# Patient Record
Sex: Female | Born: 1935 | Race: White | Hispanic: No | Smoking: Never smoker
Health system: Southern US, Community
[De-identification: ages and names within clinical notes are randomized; demographics above are authoritative.]

## PROBLEM LIST (undated history)

## (undated) DIAGNOSIS — I2510 Atherosclerotic heart disease of native coronary artery without angina pectoris: Secondary | ICD-10-CM

## (undated) DIAGNOSIS — F0390 Unspecified dementia without behavioral disturbance: Secondary | ICD-10-CM

## (undated) DIAGNOSIS — M109 Gout, unspecified: Secondary | ICD-10-CM

## (undated) DIAGNOSIS — C7A8 Other malignant neuroendocrine tumors: Principal | ICD-10-CM

## (undated) DIAGNOSIS — Z8719 Personal history of other diseases of the digestive system: Secondary | ICD-10-CM

## (undated) DIAGNOSIS — I639 Cerebral infarction, unspecified: Secondary | ICD-10-CM

## (undated) DIAGNOSIS — I214 Non-ST elevation (NSTEMI) myocardial infarction: Secondary | ICD-10-CM

## (undated) DIAGNOSIS — C9110 Chronic lymphocytic leukemia of B-cell type not having achieved remission: Secondary | ICD-10-CM

## (undated) DIAGNOSIS — R0609 Other forms of dyspnea: Secondary | ICD-10-CM

## (undated) DIAGNOSIS — I219 Acute myocardial infarction, unspecified: Secondary | ICD-10-CM

## (undated) DIAGNOSIS — K219 Gastro-esophageal reflux disease without esophagitis: Secondary | ICD-10-CM

## (undated) DIAGNOSIS — K922 Gastrointestinal hemorrhage, unspecified: Secondary | ICD-10-CM

## (undated) DIAGNOSIS — E785 Hyperlipidemia, unspecified: Secondary | ICD-10-CM

## (undated) DIAGNOSIS — I2789 Other specified pulmonary heart diseases: Secondary | ICD-10-CM

## (undated) DIAGNOSIS — I10 Essential (primary) hypertension: Secondary | ICD-10-CM

## (undated) DIAGNOSIS — D649 Anemia, unspecified: Secondary | ICD-10-CM

## (undated) DIAGNOSIS — K869 Disease of pancreas, unspecified: Secondary | ICD-10-CM

## (undated) DIAGNOSIS — F32A Depression, unspecified: Secondary | ICD-10-CM

## (undated) DIAGNOSIS — I4891 Unspecified atrial fibrillation: Secondary | ICD-10-CM

## (undated) DIAGNOSIS — F329 Major depressive disorder, single episode, unspecified: Secondary | ICD-10-CM

## (undated) DIAGNOSIS — E109 Type 1 diabetes mellitus without complications: Secondary | ICD-10-CM

## (undated) DIAGNOSIS — D72829 Elevated white blood cell count, unspecified: Secondary | ICD-10-CM

## (undated) DIAGNOSIS — I4821 Permanent atrial fibrillation: Secondary | ICD-10-CM

## (undated) DIAGNOSIS — G609 Hereditary and idiopathic neuropathy, unspecified: Secondary | ICD-10-CM

## (undated) DIAGNOSIS — I209 Angina pectoris, unspecified: Secondary | ICD-10-CM

## (undated) HISTORY — DX: Personal history of other diseases of the digestive system: Z87.19

## (undated) HISTORY — DX: Disease of pancreas, unspecified: K86.9

## (undated) HISTORY — DX: Atherosclerotic heart disease of native coronary artery without angina pectoris: I25.10

## (undated) HISTORY — DX: Other specified pulmonary heart diseases: I27.89

## (undated) HISTORY — DX: Essential (primary) hypertension: I10

## (undated) HISTORY — PX: CATARACT EXTRACTION W/ INTRAOCULAR LENS  IMPLANT, BILATERAL: SHX1307

## (undated) HISTORY — DX: Anemia, unspecified: D64.9

## (undated) HISTORY — DX: Chronic lymphocytic leukemia of B-cell type not having achieved remission: C91.10

## (undated) HISTORY — DX: Gout, unspecified: M10.9

## (undated) HISTORY — DX: Gastrointestinal hemorrhage, unspecified: K92.2

## (undated) HISTORY — DX: Hyperlipidemia, unspecified: E78.5

## (undated) HISTORY — DX: Hereditary and idiopathic neuropathy, unspecified: G60.9

## (undated) HISTORY — PX: APPENDECTOMY: SHX54

## (undated) HISTORY — DX: Type 1 diabetes mellitus without complications: E10.9

## (undated) HISTORY — DX: Elevated white blood cell count, unspecified: D72.829

## (undated) HISTORY — DX: Gastro-esophageal reflux disease without esophagitis: K21.9

## (undated) HISTORY — DX: Other malignant neuroendocrine tumors: C7A.8

## (undated) HISTORY — PX: CARDIAC SURGERY: SHX584

## (undated) HISTORY — PX: BELPHAROPTOSIS REPAIR: SHX369

---
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
## 1968-12-12 HISTORY — PX: OOPHORECTOMY: SHX86

## 1968-12-12 HISTORY — PX: ABDOMINAL HYSTERECTOMY: SHX81

## 1997-04-13 DIAGNOSIS — I219 Acute myocardial infarction, unspecified: Secondary | ICD-10-CM

## 1997-04-13 HISTORY — DX: Acute myocardial infarction, unspecified: I21.9

## 1997-04-13 HISTORY — PX: CORONARY ARTERY BYPASS GRAFT: SHX141

## 1998-07-02 ENCOUNTER — Ambulatory Visit (HOSPITAL_COMMUNITY): Admission: RE | Admit: 1998-07-02 | Discharge: 1998-07-03 | Payer: Self-pay | Admitting: Cardiology

## 1998-07-02 ENCOUNTER — Encounter: Payer: Self-pay | Admitting: Cardiology

## 1998-07-03 ENCOUNTER — Encounter: Payer: Self-pay | Admitting: Cardiology

## 1999-02-21 ENCOUNTER — Encounter: Payer: Self-pay | Admitting: Endocrinology

## 1999-02-21 ENCOUNTER — Encounter: Admission: RE | Admit: 1999-02-21 | Discharge: 1999-02-21 | Payer: Self-pay | Admitting: Endocrinology

## 1999-07-29 ENCOUNTER — Encounter: Payer: Self-pay | Admitting: Cardiology

## 1999-07-30 ENCOUNTER — Inpatient Hospital Stay (HOSPITAL_COMMUNITY): Admission: RE | Admit: 1999-07-30 | Discharge: 1999-07-31 | Payer: Self-pay | Admitting: Cardiology

## 1999-07-30 ENCOUNTER — Encounter: Payer: Self-pay | Admitting: Cardiology

## 2000-01-14 ENCOUNTER — Encounter: Payer: Self-pay | Admitting: Endocrinology

## 2000-01-14 ENCOUNTER — Encounter: Admission: RE | Admit: 2000-01-14 | Discharge: 2000-01-14 | Payer: Self-pay | Admitting: Endocrinology

## 2001-02-07 ENCOUNTER — Ambulatory Visit (HOSPITAL_COMMUNITY): Admission: RE | Admit: 2001-02-07 | Discharge: 2001-02-07 | Payer: Self-pay | Admitting: Family Medicine

## 2001-02-07 ENCOUNTER — Encounter: Payer: Self-pay | Admitting: Family Medicine

## 2001-10-25 ENCOUNTER — Ambulatory Visit (HOSPITAL_COMMUNITY): Admission: RE | Admit: 2001-10-25 | Discharge: 2001-10-25 | Payer: Self-pay | Admitting: Ophthalmology

## 2001-10-29 ENCOUNTER — Emergency Department (HOSPITAL_COMMUNITY): Admission: EM | Admit: 2001-10-29 | Discharge: 2001-10-29 | Payer: Self-pay | Admitting: Internal Medicine

## 2001-11-10 ENCOUNTER — Ambulatory Visit (HOSPITAL_COMMUNITY): Admission: RE | Admit: 2001-11-10 | Discharge: 2001-11-10 | Payer: Self-pay | Admitting: Ophthalmology

## 2001-12-15 ENCOUNTER — Ambulatory Visit (HOSPITAL_COMMUNITY): Admission: RE | Admit: 2001-12-15 | Discharge: 2001-12-15 | Payer: Self-pay | Admitting: Family Medicine

## 2001-12-15 ENCOUNTER — Encounter: Payer: Self-pay | Admitting: Family Medicine

## 2002-09-25 ENCOUNTER — Encounter: Payer: Self-pay | Admitting: Family Medicine

## 2002-09-25 ENCOUNTER — Ambulatory Visit (HOSPITAL_COMMUNITY): Admission: RE | Admit: 2002-09-25 | Discharge: 2002-09-25 | Payer: Self-pay | Admitting: Family Medicine

## 2002-10-31 ENCOUNTER — Encounter: Payer: Self-pay | Admitting: Otolaryngology

## 2002-10-31 ENCOUNTER — Encounter: Admission: RE | Admit: 2002-10-31 | Discharge: 2002-10-31 | Payer: Self-pay | Admitting: Otolaryngology

## 2003-04-03 ENCOUNTER — Ambulatory Visit (HOSPITAL_COMMUNITY): Admission: RE | Admit: 2003-04-03 | Discharge: 2003-04-03 | Payer: Self-pay | Admitting: Family Medicine

## 2003-04-18 ENCOUNTER — Encounter: Admission: RE | Admit: 2003-04-18 | Discharge: 2003-04-18 | Payer: Self-pay | Admitting: Neurology

## 2003-05-29 ENCOUNTER — Ambulatory Visit (HOSPITAL_COMMUNITY): Admission: RE | Admit: 2003-05-29 | Discharge: 2003-05-29 | Payer: Self-pay | Admitting: Family Medicine

## 2003-06-08 ENCOUNTER — Encounter: Admission: RE | Admit: 2003-06-08 | Discharge: 2003-06-08 | Payer: Self-pay | Admitting: Neurology

## 2003-06-18 ENCOUNTER — Ambulatory Visit (HOSPITAL_COMMUNITY): Admission: RE | Admit: 2003-06-18 | Discharge: 2003-06-18 | Payer: Self-pay | Admitting: Family Medicine

## 2003-07-04 ENCOUNTER — Encounter (HOSPITAL_COMMUNITY): Admission: RE | Admit: 2003-07-04 | Discharge: 2003-08-03 | Payer: Self-pay | Admitting: Neurology

## 2003-08-23 ENCOUNTER — Ambulatory Visit (HOSPITAL_COMMUNITY): Admission: RE | Admit: 2003-08-23 | Discharge: 2003-08-23 | Payer: Self-pay | Admitting: *Deleted

## 2003-10-08 ENCOUNTER — Ambulatory Visit (HOSPITAL_COMMUNITY): Admission: RE | Admit: 2003-10-08 | Discharge: 2003-10-08 | Payer: Self-pay | Admitting: *Deleted

## 2004-12-11 ENCOUNTER — Ambulatory Visit: Payer: Self-pay | Admitting: *Deleted

## 2005-01-20 ENCOUNTER — Ambulatory Visit: Payer: Self-pay | Admitting: Cardiology

## 2005-01-28 ENCOUNTER — Ambulatory Visit: Payer: Self-pay | Admitting: *Deleted

## 2005-01-29 ENCOUNTER — Ambulatory Visit: Payer: Self-pay | Admitting: Cardiology

## 2005-02-12 ENCOUNTER — Ambulatory Visit: Payer: Self-pay | Admitting: *Deleted

## 2005-03-26 ENCOUNTER — Ambulatory Visit (HOSPITAL_COMMUNITY): Admission: RE | Admit: 2005-03-26 | Discharge: 2005-03-26 | Payer: Self-pay | Admitting: Family Medicine

## 2005-03-30 ENCOUNTER — Encounter: Admission: RE | Admit: 2005-03-30 | Discharge: 2005-03-30 | Payer: Self-pay | Admitting: Family Medicine

## 2005-04-15 ENCOUNTER — Encounter: Admission: RE | Admit: 2005-04-15 | Discharge: 2005-04-15 | Payer: Self-pay | Admitting: Family Medicine

## 2005-06-22 ENCOUNTER — Ambulatory Visit (HOSPITAL_COMMUNITY): Admission: RE | Admit: 2005-06-22 | Discharge: 2005-06-22 | Payer: Self-pay | Admitting: Family Medicine

## 2005-06-25 ENCOUNTER — Ambulatory Visit: Payer: Self-pay | Admitting: Cardiology

## 2005-07-11 ENCOUNTER — Emergency Department (HOSPITAL_COMMUNITY): Admission: EM | Admit: 2005-07-11 | Discharge: 2005-07-11 | Payer: Self-pay | Admitting: Emergency Medicine

## 2005-09-29 ENCOUNTER — Ambulatory Visit (HOSPITAL_COMMUNITY): Admission: RE | Admit: 2005-09-29 | Discharge: 2005-09-29 | Payer: Self-pay | Admitting: Family Medicine

## 2005-10-02 ENCOUNTER — Ambulatory Visit: Payer: Self-pay | Admitting: Cardiology

## 2005-10-07 ENCOUNTER — Ambulatory Visit: Payer: Self-pay | Admitting: Internal Medicine

## 2005-10-09 ENCOUNTER — Ambulatory Visit (HOSPITAL_COMMUNITY): Admission: RE | Admit: 2005-10-09 | Discharge: 2005-10-09 | Payer: Self-pay | Admitting: Internal Medicine

## 2005-10-29 ENCOUNTER — Ambulatory Visit: Payer: Self-pay | Admitting: Cardiology

## 2005-11-17 ENCOUNTER — Encounter: Payer: Self-pay | Admitting: Gastroenterology

## 2005-11-17 ENCOUNTER — Ambulatory Visit: Payer: Self-pay | Admitting: Internal Medicine

## 2005-11-17 ENCOUNTER — Ambulatory Visit (HOSPITAL_COMMUNITY): Admission: RE | Admit: 2005-11-17 | Discharge: 2005-11-17 | Payer: Self-pay | Admitting: Internal Medicine

## 2005-11-20 ENCOUNTER — Ambulatory Visit (HOSPITAL_COMMUNITY): Admission: RE | Admit: 2005-11-20 | Discharge: 2005-11-20 | Payer: Self-pay | Admitting: Family Medicine

## 2006-01-19 ENCOUNTER — Inpatient Hospital Stay (HOSPITAL_COMMUNITY): Admission: AD | Admit: 2006-01-19 | Discharge: 2006-01-21 | Payer: Self-pay | Admitting: Cardiology

## 2006-01-19 ENCOUNTER — Ambulatory Visit: Payer: Self-pay | Admitting: Cardiology

## 2006-01-28 ENCOUNTER — Ambulatory Visit: Payer: Self-pay | Admitting: Cardiology

## 2006-03-17 ENCOUNTER — Ambulatory Visit: Payer: Self-pay | Admitting: Cardiology

## 2006-04-08 ENCOUNTER — Ambulatory Visit: Payer: Self-pay | Admitting: Cardiology

## 2006-04-22 ENCOUNTER — Ambulatory Visit (HOSPITAL_COMMUNITY): Admission: RE | Admit: 2006-04-22 | Discharge: 2006-04-22 | Payer: Self-pay | Admitting: Family Medicine

## 2006-09-02 ENCOUNTER — Ambulatory Visit: Payer: Self-pay | Admitting: Internal Medicine

## 2006-09-10 ENCOUNTER — Ambulatory Visit (HOSPITAL_COMMUNITY): Admission: RE | Admit: 2006-09-10 | Discharge: 2006-09-10 | Payer: Self-pay | Admitting: Family Medicine

## 2006-09-21 ENCOUNTER — Ambulatory Visit: Payer: Self-pay | Admitting: Cardiology

## 2006-09-21 LAB — CONVERTED CEMR LAB
GFR calc non Af Amer: 75 mL/min
Potassium: 4.8 meq/L (ref 3.5–5.1)
Sodium: 136 meq/L (ref 135–145)

## 2006-10-20 ENCOUNTER — Encounter: Payer: Self-pay | Admitting: Cardiology

## 2006-10-20 ENCOUNTER — Encounter: Payer: Self-pay | Admitting: Gastroenterology

## 2006-10-20 HISTORY — PX: COLONOSCOPY: SHX174

## 2006-10-28 ENCOUNTER — Ambulatory Visit (HOSPITAL_COMMUNITY): Admission: RE | Admit: 2006-10-28 | Discharge: 2006-10-28 | Payer: Self-pay | Admitting: Endocrinology

## 2006-11-29 ENCOUNTER — Ambulatory Visit: Payer: Self-pay | Admitting: Cardiology

## 2006-12-15 ENCOUNTER — Encounter: Payer: Self-pay | Admitting: Cardiology

## 2007-01-04 ENCOUNTER — Ambulatory Visit (HOSPITAL_COMMUNITY): Admission: RE | Admit: 2007-01-04 | Discharge: 2007-01-04 | Payer: Self-pay | Admitting: Family Medicine

## 2007-02-02 ENCOUNTER — Ambulatory Visit: Payer: Self-pay | Admitting: Cardiology

## 2007-03-22 ENCOUNTER — Ambulatory Visit: Payer: Self-pay | Admitting: Cardiology

## 2007-04-12 ENCOUNTER — Ambulatory Visit: Payer: Self-pay

## 2007-04-14 HISTORY — PX: CORONARY ANGIOPLASTY WITH STENT PLACEMENT: SHX49

## 2007-04-15 ENCOUNTER — Ambulatory Visit: Payer: Self-pay | Admitting: Cardiology

## 2007-06-20 ENCOUNTER — Ambulatory Visit: Payer: Self-pay | Admitting: Cardiology

## 2007-06-20 LAB — CONVERTED CEMR LAB
CO2: 29 meq/L (ref 19–32)
Chloride: 105 meq/L (ref 96–112)
Creatinine, Ser: 0.8 mg/dL (ref 0.4–1.2)
GFR calc Af Amer: 90 mL/min
GFR calc non Af Amer: 74 mL/min
HCT: 41.7 % (ref 36.0–46.0)
MCV: 89.3 fL (ref 78.0–100.0)
Monocytes Absolute: 0.6 10*3/uL (ref 0.2–0.7)
Monocytes Relative: 4.5 % (ref 3.0–11.0)
Neutrophils Relative %: 68.9 % (ref 43.0–77.0)
Platelets: 308 10*3/uL (ref 150–400)
Potassium: 4.2 meq/L (ref 3.5–5.1)
Prothrombin Time: 12 s (ref 10.9–13.3)
RDW: 14.2 % (ref 11.5–14.6)
Sodium: 142 meq/L (ref 135–145)
WBC: 12.5 10*3/uL — ABNORMAL HIGH (ref 4.5–10.5)
aPTT: 21.6 s — ABNORMAL LOW (ref 21.7–29.8)

## 2007-06-21 ENCOUNTER — Inpatient Hospital Stay (HOSPITAL_COMMUNITY): Admission: RE | Admit: 2007-06-21 | Discharge: 2007-06-23 | Payer: Self-pay | Admitting: Cardiology

## 2007-06-21 ENCOUNTER — Ambulatory Visit: Payer: Self-pay | Admitting: Cardiology

## 2007-06-28 ENCOUNTER — Inpatient Hospital Stay (HOSPITAL_COMMUNITY): Admission: AD | Admit: 2007-06-28 | Discharge: 2007-06-30 | Payer: Self-pay | Admitting: Cardiology

## 2007-06-28 ENCOUNTER — Ambulatory Visit: Payer: Self-pay | Admitting: Cardiology

## 2007-06-29 ENCOUNTER — Encounter: Payer: Self-pay | Admitting: Cardiology

## 2007-07-04 ENCOUNTER — Ambulatory Visit (HOSPITAL_COMMUNITY): Admission: RE | Admit: 2007-07-04 | Discharge: 2007-07-04 | Payer: Self-pay | Admitting: Family Medicine

## 2007-07-06 ENCOUNTER — Ambulatory Visit: Payer: Self-pay

## 2007-09-06 ENCOUNTER — Ambulatory Visit: Payer: Self-pay | Admitting: Cardiology

## 2007-09-06 LAB — CONVERTED CEMR LAB
Basophils Relative: 0.4 % (ref 0.0–1.0)
Eosinophils Relative: 2.1 % (ref 0.0–5.0)
HCT: 37.2 % (ref 36.0–46.0)
Hemoglobin: 12.5 g/dL (ref 12.0–15.0)
MCHC: 33.8 g/dL (ref 30.0–36.0)
MCV: 92.5 fL (ref 78.0–100.0)
Monocytes Relative: 6.2 % (ref 3.0–12.0)
Platelets: 256 10*3/uL (ref 150–400)
RBC: 4.02 M/uL (ref 3.87–5.11)
RDW: 15.7 % — ABNORMAL HIGH (ref 11.5–14.6)

## 2007-09-29 ENCOUNTER — Ambulatory Visit: Payer: Self-pay | Admitting: Cardiology

## 2007-10-05 ENCOUNTER — Encounter: Payer: Self-pay | Admitting: Cardiology

## 2007-10-25 ENCOUNTER — Encounter: Admission: RE | Admit: 2007-10-25 | Discharge: 2007-10-25 | Payer: Self-pay | Admitting: Endocrinology

## 2007-11-03 ENCOUNTER — Ambulatory Visit: Payer: Self-pay | Admitting: Oncology

## 2007-11-15 ENCOUNTER — Encounter: Payer: Self-pay | Admitting: Gastroenterology

## 2007-11-22 ENCOUNTER — Encounter: Payer: Self-pay | Admitting: Gastroenterology

## 2007-11-22 ENCOUNTER — Encounter: Payer: Self-pay | Admitting: Cardiology

## 2007-12-01 ENCOUNTER — Encounter: Payer: Self-pay | Admitting: Cardiology

## 2007-12-07 ENCOUNTER — Ambulatory Visit: Payer: Self-pay | Admitting: Cardiology

## 2007-12-15 ENCOUNTER — Encounter: Payer: Self-pay | Admitting: Gastroenterology

## 2008-02-14 ENCOUNTER — Ambulatory Visit: Payer: Self-pay | Admitting: Oncology

## 2008-02-14 LAB — MORPHOLOGY: PLT EST: ADEQUATE

## 2008-02-14 LAB — CBC WITH DIFFERENTIAL/PLATELET
BASO%: 0.6 % (ref 0.0–2.0)
MCHC: 34.4 g/dL (ref 32.0–36.0)
MONO#: 0.8 10*3/uL (ref 0.1–0.9)
NEUT#: 8.2 10*3/uL — ABNORMAL HIGH (ref 1.5–6.5)
RBC: 4.32 10*6/uL (ref 3.70–5.32)
WBC: 11.8 10*3/uL — ABNORMAL HIGH (ref 3.9–10.0)
lymph#: 2.4 10*3/uL (ref 0.9–3.3)

## 2008-03-26 ENCOUNTER — Ambulatory Visit: Payer: Self-pay | Admitting: Cardiology

## 2008-05-11 ENCOUNTER — Ambulatory Visit (HOSPITAL_COMMUNITY): Admission: RE | Admit: 2008-05-11 | Discharge: 2008-05-11 | Payer: Self-pay | Admitting: Family Medicine

## 2008-05-25 ENCOUNTER — Ambulatory Visit: Payer: Self-pay | Admitting: Cardiology

## 2008-06-21 ENCOUNTER — Ambulatory Visit: Payer: Self-pay | Admitting: Cardiology

## 2008-07-09 DIAGNOSIS — I519 Heart disease, unspecified: Secondary | ICD-10-CM

## 2008-07-09 DIAGNOSIS — M109 Gout, unspecified: Secondary | ICD-10-CM

## 2008-07-09 DIAGNOSIS — I10 Essential (primary) hypertension: Secondary | ICD-10-CM | POA: Insufficient documentation

## 2008-07-09 DIAGNOSIS — Z8719 Personal history of other diseases of the digestive system: Secondary | ICD-10-CM

## 2008-07-09 DIAGNOSIS — G609 Hereditary and idiopathic neuropathy, unspecified: Secondary | ICD-10-CM | POA: Insufficient documentation

## 2008-07-09 DIAGNOSIS — E109 Type 1 diabetes mellitus without complications: Secondary | ICD-10-CM | POA: Insufficient documentation

## 2008-07-09 DIAGNOSIS — I2789 Other specified pulmonary heart diseases: Secondary | ICD-10-CM | POA: Insufficient documentation

## 2008-07-10 ENCOUNTER — Encounter: Payer: Self-pay | Admitting: Cardiology

## 2008-07-10 ENCOUNTER — Ambulatory Visit: Payer: Self-pay | Admitting: Cardiology

## 2008-07-25 ENCOUNTER — Inpatient Hospital Stay (HOSPITAL_COMMUNITY): Admission: EM | Admit: 2008-07-25 | Discharge: 2008-07-27 | Payer: Self-pay | Admitting: Emergency Medicine

## 2008-07-25 ENCOUNTER — Telehealth: Payer: Self-pay | Admitting: Cardiology

## 2008-07-25 ENCOUNTER — Ambulatory Visit: Payer: Self-pay | Admitting: Cardiology

## 2008-07-30 ENCOUNTER — Encounter: Payer: Self-pay | Admitting: Cardiology

## 2008-07-30 ENCOUNTER — Ambulatory Visit: Payer: Self-pay | Admitting: Cardiology

## 2008-09-11 ENCOUNTER — Ambulatory Visit: Payer: Self-pay | Admitting: Cardiology

## 2008-10-18 ENCOUNTER — Ambulatory Visit: Payer: Self-pay

## 2008-10-18 ENCOUNTER — Encounter: Payer: Self-pay | Admitting: Cardiology

## 2008-10-18 ENCOUNTER — Ambulatory Visit: Payer: Self-pay | Admitting: Cardiology

## 2008-11-05 ENCOUNTER — Encounter: Payer: Self-pay | Admitting: Cardiology

## 2008-11-28 ENCOUNTER — Telehealth: Payer: Self-pay | Admitting: Cardiology

## 2008-12-04 ENCOUNTER — Encounter: Admission: RE | Admit: 2008-12-04 | Discharge: 2008-12-04 | Payer: Self-pay | Admitting: Endocrinology

## 2009-01-23 ENCOUNTER — Encounter (INDEPENDENT_AMBULATORY_CARE_PROVIDER_SITE_OTHER): Payer: Self-pay | Admitting: *Deleted

## 2009-01-29 ENCOUNTER — Telehealth: Payer: Self-pay | Admitting: Cardiology

## 2009-01-29 ENCOUNTER — Ambulatory Visit: Payer: Self-pay | Admitting: Cardiology

## 2009-01-29 DIAGNOSIS — E7800 Pure hypercholesterolemia, unspecified: Secondary | ICD-10-CM

## 2009-01-31 ENCOUNTER — Encounter: Payer: Self-pay | Admitting: Cardiology

## 2009-02-07 ENCOUNTER — Ambulatory Visit: Payer: Self-pay | Admitting: Cardiology

## 2009-02-07 DIAGNOSIS — K862 Cyst of pancreas: Secondary | ICD-10-CM | POA: Insufficient documentation

## 2009-02-07 DIAGNOSIS — K863 Pseudocyst of pancreas: Secondary | ICD-10-CM

## 2009-02-15 ENCOUNTER — Ambulatory Visit (HOSPITAL_COMMUNITY): Admission: RE | Admit: 2009-02-15 | Discharge: 2009-02-15 | Payer: Self-pay | Admitting: Cardiology

## 2009-03-12 ENCOUNTER — Encounter (INDEPENDENT_AMBULATORY_CARE_PROVIDER_SITE_OTHER): Payer: Self-pay | Admitting: *Deleted

## 2009-03-13 ENCOUNTER — Ambulatory Visit: Payer: Self-pay | Admitting: Cardiology

## 2009-03-19 ENCOUNTER — Telehealth: Payer: Self-pay | Admitting: Cardiology

## 2009-03-27 ENCOUNTER — Encounter: Payer: Self-pay | Admitting: Cardiology

## 2009-03-27 ENCOUNTER — Encounter: Payer: Self-pay | Admitting: Gastroenterology

## 2009-04-02 ENCOUNTER — Encounter: Payer: Self-pay | Admitting: Gastroenterology

## 2009-04-02 ENCOUNTER — Telehealth (INDEPENDENT_AMBULATORY_CARE_PROVIDER_SITE_OTHER): Payer: Self-pay | Admitting: *Deleted

## 2009-04-13 HISTORY — PX: PANCREATECTOMY: SHX5261

## 2009-04-19 ENCOUNTER — Ambulatory Visit (HOSPITAL_COMMUNITY): Admission: RE | Admit: 2009-04-19 | Discharge: 2009-04-19 | Payer: Self-pay | Admitting: Family Medicine

## 2009-04-25 ENCOUNTER — Ambulatory Visit: Payer: Self-pay | Admitting: Gastroenterology

## 2009-04-25 ENCOUNTER — Ambulatory Visit (HOSPITAL_COMMUNITY): Admission: RE | Admit: 2009-04-25 | Discharge: 2009-04-25 | Payer: Self-pay | Admitting: Gastroenterology

## 2009-04-30 ENCOUNTER — Encounter: Payer: Self-pay | Admitting: Gastroenterology

## 2009-05-01 ENCOUNTER — Encounter: Payer: Self-pay | Admitting: Cardiology

## 2009-05-01 ENCOUNTER — Telehealth: Payer: Self-pay | Admitting: Cardiology

## 2009-05-13 ENCOUNTER — Telehealth: Payer: Self-pay | Admitting: Gastroenterology

## 2009-05-23 ENCOUNTER — Encounter: Payer: Self-pay | Admitting: Cardiology

## 2009-05-28 ENCOUNTER — Encounter: Payer: Self-pay | Admitting: Cardiology

## 2009-05-28 ENCOUNTER — Encounter: Payer: Self-pay | Admitting: Gastroenterology

## 2009-06-06 ENCOUNTER — Ambulatory Visit (HOSPITAL_COMMUNITY): Admission: RE | Admit: 2009-06-06 | Discharge: 2009-06-06 | Payer: Self-pay | Admitting: Family Medicine

## 2009-06-14 ENCOUNTER — Telehealth: Payer: Self-pay | Admitting: Cardiology

## 2009-06-24 ENCOUNTER — Ambulatory Visit: Payer: Self-pay | Admitting: Cardiology

## 2009-06-24 ENCOUNTER — Ambulatory Visit (HOSPITAL_COMMUNITY): Admission: RE | Admit: 2009-06-24 | Discharge: 2009-06-24 | Payer: Self-pay | Admitting: Internal Medicine

## 2009-07-03 ENCOUNTER — Encounter: Payer: Self-pay | Admitting: Cardiology

## 2009-07-18 ENCOUNTER — Telehealth: Payer: Self-pay | Admitting: Gastroenterology

## 2009-07-31 ENCOUNTER — Telehealth (INDEPENDENT_AMBULATORY_CARE_PROVIDER_SITE_OTHER): Payer: Self-pay | Admitting: *Deleted

## 2009-08-06 ENCOUNTER — Ambulatory Visit: Payer: Self-pay | Admitting: Cardiology

## 2009-08-06 ENCOUNTER — Inpatient Hospital Stay (HOSPITAL_COMMUNITY): Admission: RE | Admit: 2009-08-06 | Discharge: 2009-08-19 | Payer: Self-pay | Admitting: Surgery

## 2009-08-06 ENCOUNTER — Encounter (INDEPENDENT_AMBULATORY_CARE_PROVIDER_SITE_OTHER): Payer: Self-pay | Admitting: Surgery

## 2009-08-19 ENCOUNTER — Inpatient Hospital Stay: Admission: AD | Admit: 2009-08-19 | Discharge: 2009-08-24 | Payer: Self-pay | Admitting: Internal Medicine

## 2009-08-30 ENCOUNTER — Ambulatory Visit (HOSPITAL_COMMUNITY)
Admission: RE | Admit: 2009-08-30 | Discharge: 2009-08-30 | Payer: Self-pay | Source: Home / Self Care | Admitting: Surgery

## 2009-09-04 ENCOUNTER — Ambulatory Visit: Payer: Self-pay | Admitting: Cardiology

## 2009-09-05 LAB — CONVERTED CEMR LAB
Basophils Relative: 0.4 % (ref 0.0–3.0)
CO2: 28 meq/L (ref 19–32)
Calcium: 9.8 mg/dL (ref 8.4–10.5)
Chloride: 102 meq/L (ref 96–112)
Eosinophils Absolute: 0.5 10*3/uL (ref 0.0–0.7)
HCT: 35.2 % — ABNORMAL LOW (ref 36.0–46.0)
Hemoglobin: 11.7 g/dL — ABNORMAL LOW (ref 12.0–15.0)
Lymphs Abs: 3.7 10*3/uL (ref 0.7–4.0)
Magnesium: 1.4 mg/dL — ABNORMAL LOW (ref 1.5–2.5)
Monocytes Absolute: 1.2 10*3/uL — ABNORMAL HIGH (ref 0.1–1.0)
Neutro Abs: 12.1 10*3/uL — ABNORMAL HIGH (ref 1.4–7.7)
Neutrophils Relative %: 69.2 % (ref 43.0–77.0)
Platelets: 590 10*3/uL — ABNORMAL HIGH (ref 150.0–400.0)
Potassium: 4.7 meq/L (ref 3.5–5.1)
RBC: 3.81 M/uL — ABNORMAL LOW (ref 3.87–5.11)
RDW: 16.3 % — ABNORMAL HIGH (ref 11.5–14.6)

## 2009-09-10 ENCOUNTER — Telehealth: Payer: Self-pay | Admitting: Cardiology

## 2009-09-12 ENCOUNTER — Encounter: Payer: Self-pay | Admitting: Cardiology

## 2009-09-19 ENCOUNTER — Telehealth: Payer: Self-pay | Admitting: Cardiology

## 2009-09-20 ENCOUNTER — Encounter: Admission: RE | Admit: 2009-09-20 | Discharge: 2009-09-20 | Payer: Self-pay | Admitting: Surgery

## 2009-09-25 ENCOUNTER — Telehealth: Payer: Self-pay | Admitting: Cardiology

## 2009-09-26 ENCOUNTER — Ambulatory Visit (HOSPITAL_COMMUNITY): Admission: RE | Admit: 2009-09-26 | Discharge: 2009-09-26 | Payer: Self-pay | Admitting: Surgery

## 2009-09-28 ENCOUNTER — Emergency Department (HOSPITAL_COMMUNITY): Admission: EM | Admit: 2009-09-28 | Discharge: 2009-09-28 | Payer: Self-pay | Admitting: Emergency Medicine

## 2009-10-02 ENCOUNTER — Encounter: Admission: RE | Admit: 2009-10-02 | Discharge: 2009-10-02 | Payer: Self-pay | Admitting: Surgery

## 2009-11-22 ENCOUNTER — Telehealth: Payer: Self-pay | Admitting: Cardiology

## 2009-12-09 ENCOUNTER — Ambulatory Visit: Payer: Self-pay | Admitting: Cardiology

## 2010-01-16 ENCOUNTER — Telehealth: Payer: Self-pay | Admitting: Cardiology

## 2010-02-17 ENCOUNTER — Ambulatory Visit: Payer: Self-pay | Admitting: Internal Medicine

## 2010-02-25 ENCOUNTER — Inpatient Hospital Stay (HOSPITAL_COMMUNITY): Admission: AD | Admit: 2010-02-25 | Discharge: 2010-03-01 | Payer: Self-pay | Admitting: Internal Medicine

## 2010-03-14 ENCOUNTER — Encounter: Payer: Self-pay | Admitting: Cardiology

## 2010-03-14 ENCOUNTER — Ambulatory Visit: Payer: Self-pay | Admitting: Cardiology

## 2010-03-17 ENCOUNTER — Encounter: Admission: RE | Admit: 2010-03-17 | Discharge: 2010-03-17 | Payer: Self-pay | Admitting: Surgery

## 2010-05-03 ENCOUNTER — Encounter: Payer: Self-pay | Admitting: Family Medicine

## 2010-05-04 ENCOUNTER — Encounter: Payer: Self-pay | Admitting: Family Medicine

## 2010-05-04 ENCOUNTER — Encounter: Payer: Self-pay | Admitting: Cardiology

## 2010-05-04 ENCOUNTER — Encounter (INDEPENDENT_AMBULATORY_CARE_PROVIDER_SITE_OTHER): Payer: Self-pay | Admitting: Internal Medicine

## 2010-05-12 ENCOUNTER — Ambulatory Visit (HOSPITAL_COMMUNITY)
Admission: RE | Admit: 2010-05-12 | Discharge: 2010-05-12 | Payer: Self-pay | Source: Home / Self Care | Attending: Endocrinology | Admitting: Endocrinology

## 2010-05-13 LAB — GLUCOSE, CAPILLARY: Glucose-Capillary: 213 mg/dL — ABNORMAL HIGH (ref 70–99)

## 2010-05-15 NOTE — Miscellaneous (Signed)
Summary: [facility]  Clinical Lists Changes  Orders: Added new Test order of [facility] ([facility]) - Signed

## 2010-05-15 NOTE — Letter (Signed)
[facility]   Imported By: Roderic Ovens 10/03/2009 09:59:48  _____________________________________________________________________  External Attachment:    Type:   Image     Comment:   External Document

## 2010-05-15 NOTE — Letter (Signed)
Summary: [facility] Office Note  [facility] Office Note   Imported By: Roderic Ovens 05/22/2009 10:52:39  _____________________________________________________________________  External Attachment:    Type:   Image     Comment:   External Document

## 2010-05-15 NOTE — Progress Notes (Signed)
Summary: [facility] Surgery Appt.  Phone Note From Other Clinic   Caller: [facility]  [phone] Call For: Dr. Christella Hartigan Summary of Call: Wanted to make you aware that patient had an appt. with this office and canceled. Did not reschedule Initial call taken by: Karna Christmas,  May 13, 2009 3:42 PM  Follow-up for Phone Call        Pt cx appt appt with [facility] and will schedule with someone else when she decides who she wants. Follow-up by: Chales Abrahams CMA Duncan Dull),  May 13, 2009 3:56 PM  Additional Follow-up for Phone Call Additional follow up Details #1::        please let her know I am happy to help in any way I can,  would recommend again that she at least come back to see me in office in next few weeks to discuss. Additional Follow-up by: Rachael Fee MD,  May 14, 2009 7:18 AM    Additional Follow-up for Phone Call Additional follow up Details #2::    pt notified but declines appt. Follow-up by: Chales Abrahams CMA ([facility]),  May 14, 2009 8:08 AM

## 2010-05-15 NOTE — Progress Notes (Signed)
Summary: stop plavix  Phone Note From Other Clinic   Caller: nurse Misty Stanley Summary of Call: Per Misty Stanley [facility] pt needs to stop plavix tomorrow for a drain placement. Is pt ok to do this?? ofc [phone] fax [phone] Initial call taken by: Edman Circle,  September 25, 2009 9:54 AM  Follow-up for Phone Call        I spoke with Misty Stanley and made her aware that the pt can hold Plavix for drain placement.  The pt cannot stop ASA.  The pt just recently held plavix for pancreatic surgery.  The pt is only going to hold Plavix one day for drain placement. Follow-up by: Julieta Gutting, RN, BSN,  September 25, 2009 10:31 AM     Appended Document: stop plavix agree with above plan.  She came off of Plavix for her surgery.  This should be ok.  TS

## 2010-05-15 NOTE — Letter (Signed)
Summary: [facility] Note   [facility]   Imported By: Roderic Ovens 11/01/2009 10:47:59  _____________________________________________________________________  External Attachment:    Type:   Image     Comment:   External Document

## 2010-05-15 NOTE — Progress Notes (Signed)
Summary: [facility]   Imported By: Debby Freiberg 10/09/2009 29:56:21  _____________________________________________________________________  External Attachment:    Type:   Image     Comment:   External Document

## 2010-05-15 NOTE — Assessment & Plan Note (Signed)
Summary: f52m   Visit Type:  Follow-up   History of Present Illness: She does not feel quite right.  Feels poorly at this point.  Went to short stay and had a magnesium infusion.  Oral mg gives her diahreaa. Thet recommended boost, which she cannot take.  No chest pain.  But feels bad.    Current Medications (verified): 1)  Benicar 40 Mg Tabs (Olmesartan Medoxomil) .... Take 1 Tablet By Mouth Once A Day 2)  Plavix 75 Mg Tabs (Clopidogrel Bisulfate) .... Take One Tablet By Mouth Daily 3)  Coreg 12.5 Mg Tabs (Carvedilol) .... Take 1 Tablet By Mouth Two Times A Day 4)  Neurontin 300 Mg Caps (Gabapentin) .... Take 1 Capsule By Mouth Three Times A Day 5)  Isosorbide Mononitrate Cr 60 Mg Xr24h-Tab (Isosorbide Mononitrate) .... Stop 6)  Nitrolingual 0.4 Mg/spray Soln (Nitroglycerin) .... One Spray Under Tongue Every 5 Minutes As Needed For Chest Pain---May Repeat Times Three 7)  Aspirin 81 Mg Tbec (Aspirin) .... Take One Tablet By Mouth Daily 8)  Norvasc 10 Mg Tabs (Amlodipine Besylate) .... Take 1 Tablet By Mouth Once A Day 9)  Vitamin D 50,000 Units .... Once A Week 10)  Stool Softener 100 Mg Caps (Docusate Sodium) .... As Needed 11)  Fish Oil 1000 Mg Caps (Omega-3 Fatty Acids) .... Stop 12)  Novolog 100 Unit/ml Soln (Insulin Aspart) .... in The Insulin Pump 13)  Colcrys 0.6 Mg Tabs (Colchicine) .... Take 1 Tablet By Mouth Three Times A Day Until Symptoms Are Gone 14)  Promethazine Hcl 25 Mg Tabs (Promethazine Hcl) .Marland Kitchen.. 1 By Mouth As Needed 15)  Acetaminophen 325 Mg  Tabs (Acetaminophen) .... As Needed  Allergies (verified): 1)  ! Morphine 2)  ! Codeine 3)  ! * Statins Drugs 4)  ! * Magnessium  Past History:  Past Medical History: Last updated: 06/24/2009 Current Problems:  PULMONARY HYPERTENSION, MILD (ICD-416.8) HYPERTENSION (ICD-401.9) DIASTOLIC DYSFUNCTION (ICD-429.9) CORONARY ARTERY DISEASE (ICD-414.00) DIVERTICULITIS, HX OF (ICD-V12.79) GOUT, UNSPECIFIED (ICD-274.9) IDDM  (ICD-250.01) PERIPHERAL NEUROPATHY (ICD-356.9) GERD (ICD-530.81 Coronary Artery Stent 06/21/2007 [facility] protocol study.  Patent at follow up.  Vital Signs:  Patient profile:   77 year old female Height:      64 inches Weight:      156 pounds BMI:     26.87 Pulse rate:   78 / minute Resp:     16 per minute BP sitting:   106 / 68  (left arm)  Vitals Entered By: Marrion Coy, CNA (Sep 04, 2009 1:59 PM)  Physical Exam  General:  Well developed, well nourished, in no acute distress. Head:  normocephalic and atraumatic Eyes:  PERRLA/EOM intact; conjunctiva and lids normal. Lungs:  Clear bilaterally to auscultation and percussion. Heart:  PMI not displaced.  Normal S1 and S2.   Abdomen:  SOme fullness in epigastrium without hematoma  (Seen by [facility] several days ago).   Pulses:  pulses normal in all 4 extremities Extremities:  No clubbing or cyanosis. Neurologic:  Alert and oriented x 3.   EKG  Procedure date:  09/04/2009  Findings:      NSR.  Vertical axis.  No acute changes.  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  Slow recovery. Not feeling well.  Problem # 2:  CORONARY ARTERY DISEASE (ICD-414.00) No ischmis symptoms.  Back on meds.  Does not feel great.  Check CBC. Her updated medication list for this problem includes:  Plavix 75 Mg Tabs (Clopidogrel bisulfate) .Marland Kitchen... Take one tablet by mouth daily    Coreg 12.5 Mg Tabs (Carvedilol) .Marland Kitchen... Take 1 tablet by mouth two times a day    Isosorbide Mononitrate Cr 60 Mg Xr24h-tab (Isosorbide mononitrate) ..... Stop    Nitrolingual 0.4 Mg/spray Soln (Nitroglycerin) ..... One spray under tongue every 5 minutes as needed for chest pain---may repeat times three    Aspirin 81 Mg Tbec (Aspirin) .Marland Kitchen... Take one tablet by mouth daily    Norvasc 10 Mg Tabs (Amlodipine besylate) .Marland Kitchen... Take 1 tablet by mouth once a day  Problem # 3:  IDDM (ICD-250.01)  followed by  Dr. Lucianne Muss. Her updated medication list for this problem includes:    Benicar 40 Mg Tabs (Olmesartan medoxomil) .Marland Kitchen... Take 1 tablet by mouth once a day    Aspirin 81 Mg Tbec (Aspirin) .Marland Kitchen... Take one tablet by mouth daily    Novolog 100 Unit/ml Soln (Insulin aspart) ..... In the insulin pump  Her updated medication list for this problem includes:    Benicar 40 Mg Tabs (Olmesartan medoxomil) .Marland Kitchen... Take 1 tablet by mouth once a day    Aspirin 81 Mg Tbec (Aspirin) .Marland Kitchen... Take one tablet by mouth daily    Novolog 100 Unit/ml Soln (Insulin aspart) ..... In the insulin pump  Her updated medication list for this problem includes:    Benicar 40 Mg Tabs (Olmesartan medoxomil) .Marland Kitchen... Take 1 tablet by mouth once a day    Aspirin 81 Mg Tbec (Aspirin) .Marland Kitchen... Take one tablet by mouth daily    Novolog 100 Unit/ml Soln (Insulin aspart) ..... In the insulin pump  Problem # 4:  HYPOMAGNESEMIA (ICD-275.2) Low after surgery.  Got IV infusion.  Does not tolerate oral sources.  Wants the value rechecked.  Some diahreaa today after taking last night.  Other Orders: EKG w/ Interpretation (93000) TLB-BMP (Basic Metabolic Panel-BMET) (80048-METABOL) TLB-CBC Platelet - w/Differential (85025-CBCD) TLB-Magnesium (Mg) (83735-MG)  Patient Instructions: 1)  Your physician recommends that you schedule a follow-up appointment in: 3 MONTHS 2)  Your physician recommends that you have lab work today: CBC, BMP and Magnesium 3)  Your physician recommends that you continue on your current medications as directed. Please refer to the Current Medication list given to you today.

## 2010-05-15 NOTE — Letter (Signed)
Summary: [facility]   Imported By: Sherian Rein 06/19/2009 14:22:17  _____________________________________________________________________  External Attachment:    Type:   Image     Comment:   External Document  Appended Document: [facility] reviewed note.  TS

## 2010-05-15 NOTE — Progress Notes (Signed)
Summary: Question about plavix  Phone Note Call from Patient Call back at Home Phone [phone]   Caller: Patient Summary of Call: Pt request call Initial call taken by: Judie Grieve,  January 16, 2010 3:56 PM  Follow-up for Phone Call        I spoke with the pt and she has to have the area in her abdomen drained again tomorrow.  The pt said a different doctor was doing the procedure tomorrow and he told her that she did not have to hold plavix.  The pt said the first time she had this done she held plavix for 5 days.  The pt cancelled the procedure tomorrow because she has not been off of her plavix.  I advised the pt that she could hold her plavix for 5 days like previously done but she cannot stop her ASA.  Pt agreed with plan and will reschedule procedure for next week.   I will forward this message to Dr Riley Kill so that he is aware.    Follow-up by: Julieta Gutting, RN, BSN,  January 16, 2010 4:54 PM     Appended Document: Question about plavix Message reviewed and agree.  TS

## 2010-05-15 NOTE — Progress Notes (Signed)
Summary: Condition update  Phone Note Call from Patient Call back at Home Phone [phone]   Caller: Patient Call For: Dr. Christella Hartigan Reason for Call: Talk to Nurse Summary of Call: Update: Pt is having surgery by Dr. Daphine Deutscher on 08-06-09 at [facility] hosp.  Initial call taken by: Karna Christmas,  July 18, 2009 10:37 AM  Follow-up for Phone Call        Pt. told I will  let Dr.Jacobs know. Follow-up by: Teryl Lucy RN,  July 18, 2009 10:54 AM  Additional Follow-up for Phone Call Additional follow up Details #1::        ok, thanks Additional Follow-up by: Rachael Fee MD,  July 18, 2009 11:13 AM

## 2010-05-15 NOTE — Progress Notes (Signed)
Summary: MD Recommendation  Phone Note Call from Patient Call back at Home Phone [phone]   Caller: Patient Reason for Call: Talk to Nurse Summary of Call: request to speak to nurse regarding a procedure Initial call taken by: Migdalia Dk,  May 01, 2009 1:58 PM  Follow-up for Phone Call        The pt does not have Cancer but does need to have surgery to remove pancreatic cyst.  The pt would like to know who Dr Riley Kill recommends to perform surgery. Julieta Gutting, RN, BSN  May 01, 2009 2:42 PM  Per Dr Riley Kill he would recommend Dr Wenda Low, Dr Ovidio Kin, Dr Cicero Duck or Dr Jaclynn Guarneri with [facility] [phone]).  I made the patient aware of Dr Rosalyn Charters recommendations. Follow-up by: Julieta Gutting, RN, BSN,  May 02, 2009 6:36 PM

## 2010-05-15 NOTE — Assessment & Plan Note (Signed)
Summary: [name]   Visit Type:  3 months follow up  CC:  Chest pains a Sob.  History of Present Illness: Patient says she has not been well since she had her surgery.  She had a CT scan, and then had to have a drain placed.  It was then removed.  Then she had another study at [facility], and then went to [facility], where she had some other study and treatment done.  We do not have these results.  They want her to have another scan in October.  She still thinks there is something wrong at this time.    Current Medications (verified): 1)  Benicar 40 Mg Tabs (Olmesartan Medoxomil) .... Take 1 Tablet By Mouth Once A Day 2)  Plavix 75 Mg Tabs (Clopidogrel Bisulfate) .... Take One Tablet By Mouth Daily 3)  Coreg 12.5 Mg Tabs (Carvedilol) .... Take 1 Tablet By Mouth Two Times A Day 4)  Neurontin 300 Mg Caps (Gabapentin) .... Take 1 Capsule By Mouth Three Times A Day 5)  Isosorbide Mononitrate Cr 60 Mg Xr24h-Tab (Isosorbide Mononitrate) .... Stop 6)  Nitrolingual 0.4 Mg/spray Soln (Nitroglycerin) .... One Spray Under Tongue Every 5 Minutes As Needed For Chest Pain---May Repeat Times Three 7)  Aspirin 81 Mg Tbec (Aspirin) .... Take One Tablet By Mouth Daily 8)  Norvasc 10 Mg Tabs (Amlodipine Besylate) .... Take 1 Tablet By Mouth Once A Day 9)  Vitamin D 50,000 Units .... Once A Week 10)  Stool Softener 100 Mg Caps (Docusate Sodium) .... As Needed 11)  Fish Oil 1000 Mg Caps (Omega-3 Fatty Acids) .... Take 1 Capsule By Mouth Once A Day 12)  Novolog 100 Unit/ml Soln (Insulin Aspart) .... in The Insulin Pump 13)  Acetaminophen 325 Mg  Tabs (Acetaminophen) .... As Needed 14)  Prenatal Vitamins 0.8 Mg Tabs (Prenatal Multivit-Min-Fe-Fa) .... Take 1 Tablet By Mouth Once A Day 15)  Magnesium 250 Mg Tabs (Magnesium) .... Take 1/2tablet Two Times A Day  Allergies: 1)  ! Morphine 2)  ! Codeine 3)  ! * Statins Drugs 4)  ! * Magnessium  Vital Signs:  Patient profile:   77 year old female Height:      64  inches Weight:      154.50 pounds BMI:     26.62 Pulse rate:   77 / minute Pulse rhythm:   regular Resp:     18 per minute BP sitting:   118 / 62  (left arm) Cuff size:   large  Vitals Entered By: Vikki Ports (December 09, 2009 1:56 PM)  Physical Exam  General:  Well developed, well nourished, in no acute distress. Head:  normocephalic and atraumatic Eyes:  PERRLA/EOM intact; conjunctiva and lids normal. Lungs:  Clear bilaterally to auscultation and percussion. Heart:  PMI nondisplaced.  Normal S1 and S2.  No murmurs at present.   Extremities:  No clubbing or cyanosis. Neurologic:  Alert and oriented x 3.   EKG  Procedure date:  12/09/2009  Findings:      NSR.  Minor nonspecific ST abnormaltiy.    Cardiac Cath  Procedure date:  07/26/2008  Findings:       CONCLUSION:   1. Continued patency of the internal mammary to the left anterior       descending.   2. Continued patency of the saphenous vein graft to the diagonal with       mild progression of the graft proximally.   3. Continued patency of the previously  placed stent from 1 year       earlier.   4. Mild progression of disease in the distal right coronary.   Impression & Recommendations:  Problem # 1:  CORONARY ARTERY DISEASE (ICD-414.00)  Currently stable.  Her major symptoms are persistent pain in the epigastirum, at site of abdominal surgery .  No acute changes.  No exertional symptoms.  Not sure she has to remain on plavix given timing, but remains stable on this.  Her updated medication list for this problem includes:    Plavix 75 Mg Tabs (Clopidogrel bisulfate) .Marland Kitchen... Take one tablet by mouth daily    Coreg 12.5 Mg Tabs (Carvedilol) .Marland Kitchen... Take 1 tablet by mouth two times a day    Isosorbide Mononitrate Cr 60 Mg Xr24h-tab (Isosorbide mononitrate) ..... Stop    Nitrolingual 0.4 Mg/spray Soln (Nitroglycerin) ..... One spray under tongue every 5 minutes as needed for chest pain---may repeat times three     Aspirin 81 Mg Tbec (Aspirin) .Marland Kitchen... Take one tablet by mouth daily    Norvasc 10 Mg Tabs (Amlodipine besylate) .Marland Kitchen... Take 1 tablet by mouth once a day  Orders: EKG w/ Interpretation (93000)  Problem # 2:  HYPERCHOLESTEROLEMIA  IIA (ICD-272.0) taking fish oil, but refuses to take a statin.   Problem # 3:  IDDM (ICD-250.01) insulin pump by Dr. Lucianne Muss Her updated medication list for this problem includes:    Benicar 40 Mg Tabs (Olmesartan medoxomil) .Marland Kitchen... Take 1 tablet by mouth once a day    Aspirin 81 Mg Tbec (Aspirin) .Marland Kitchen... Take one tablet by mouth daily    Novolog 100 Unit/ml Soln (Insulin aspart) ..... In the insulin pump  Problem # 4:  HYPERTENSION (ICD-401.9) currently controlled.   Her updated medication list for this problem includes:    Benicar 40 Mg Tabs (Olmesartan medoxomil) .Marland Kitchen... Take 1 tablet by mouth once a day    Coreg 12.5 Mg Tabs (Carvedilol) .Marland Kitchen... Take 1 tablet by mouth two times a day    Aspirin 81 Mg Tbec (Aspirin) .Marland Kitchen... Take one tablet by mouth daily    Norvasc 10 Mg Tabs (Amlodipine besylate) .Marland Kitchen... Take 1 tablet by mouth once a day  Patient Instructions: 1)  Your physician recommends that you schedule a follow-up appointment in: 3 MONTHS with DR Riley Kill 2)  Your physician recommends that you continue on your current medications as directed. Please refer to the Current Medication list given to you today.

## 2010-05-15 NOTE — Progress Notes (Signed)
Summary: discuss labs  Phone Note Call from Patient Call back at Home Phone [phone]   Caller: Patient Reason for Call: Talk to Nurse Summary of Call: request to speak to nurse about procedure Initial call taken by: Migdalia Dk,  September 19, 2009 9:37 AM  Follow-up for Phone Call        I spoke with the pt and she is still having problems with her stomach.  The pt saw Dr Daphine Deutscher yesterday and has been scheduled for a CT scan tomorrow.  The pt said she had her lab work rechecked and her WBC had increased.  I made the pt aware that Dr Riley Kill had reviewed those labs.  The pt said she has a history of elevated WBC and had followed with Dr Darnelle Catalan and then this was turned over to Dr Lucianne Muss to follow-up.  The pt would like Dr Rosalyn Charters thoughts about being referred back to Hematology for evaluation.  Follow-up by: Julieta Gutting, RN, BSN,  September 19, 2009 10:28 AM  Additional Follow-up for Phone Call Additional follow up Details #1::        I spoke with the pt and made her aware that Dr Riley Kill did not recommend having her see a hematologist at this time.   Additional Follow-up by: Julieta Gutting, RN, BSN,  September 20, 2009 2:52 PM

## 2010-05-15 NOTE — Procedures (Signed)
Summary: [facility] - Colonoscopy   [facility] - Colonoscopy   Imported By: Roderic Ovens 05/22/2009 11:00:28  _____________________________________________________________________  External Attachment:    Type:   Image     Comment:   External Document

## 2010-05-15 NOTE — Letter (Signed)
Summary: [facility]   Imported By: Roderic Ovens 07/03/2009 15:19:50  _____________________________________________________________________  External Attachment:    Type:   Image     Comment:   External Document

## 2010-05-15 NOTE — Assessment & Plan Note (Signed)
Summary: surgical clearance   Visit Type:  Follow-up  CC:  Surgical clearance -Pancreatectomy.  History of Present Illness: Pt scheduled for surgery with Dr. Daphine Deutscher.  Needs pancreatectomy.  Pt had shots for meningococcus, H flu, and pneumococcal recently.  Patient has some intermittent chest pain.  She did use NTG about a week ago.  It went away.  Has had R foot gout for nearly a week.  She went to Dr. Ulice Brilliant last week, and got medication for it.  Got cocrys for this.  She then saw Dr.Golden, and she got a shot of probably steroids, it helped gout, but is still red.  Her chest discomfort has not changed in the last year.  She has remained stable on medications.  Current Medications (verified): 1)  Benicar 40 Mg Tabs (Olmesartan Medoxomil) .... Take 1 Tablet By Mouth Once A Day 2)  Plavix 75 Mg Tabs (Clopidogrel Bisulfate) .... Take One Tablet By Mouth Daily 3)  Coreg 12.5 Mg Tabs (Carvedilol) .... Take 1 Tablet By Mouth Two Times A Day 4)  Neurontin 300 Mg Caps (Gabapentin) .... Take 1 Capsule By Mouth Three Times A Day 5)  Isosorbide Mononitrate Cr 60 Mg Xr24h-Tab (Isosorbide Mononitrate) .... Take One Tablet Dailly 6)  Nitrolingual 0.4 Mg/spray Soln (Nitroglycerin) .... One Spray Under Tongue Every 5 Minutes As Needed For Chest Pain---May Repeat Times Three 7)  Insulin Pump .... As Needed 8)  Aspirin 81 Mg Tbec (Aspirin) .... Take One Tablet By Mouth Daily 9)  Norvasc 10 Mg Tabs (Amlodipine Besylate) .... Take 1 Tablet By Mouth Once A Day 10)  Vitamin D 50,000 Units .... Once A Week 11)  Stool Softener 100 Mg Caps (Docusate Sodium) .... As Needed 12)  Fish Oil 1000 Mg Caps (Omega-3 Fatty Acids) .... About 2 or 3 Perweek 13)  Novolog 100 Unit/ml Soln (Insulin Aspart) .... in The Insulin Pump 14)  Colcrys 0.6 Mg Tabs (Colchicine) .... Take 1 Tablet By Mouth Three Times A Day Until Symptoms Are Gone  Allergies: 1)  ! Morphine 2)  ! Codeine 3)  ! * Statins Drugs 4)  ! * Magnessium  Past  History:  Past Surgical History: Last updated: 07-16-08  History:insertion  of  coronary arterystent, CABG . coronary artery bypass graft Appendectomy Oophorectomy with hysterectomy, and bladder tacking procedure.  Tonsillectomy  Ptosis repair, right upper eyelid.     Family History: Last updated: July 16, 2008  Mother died of CVA at age 32.  Her father died of lung   cancer age 57. Her sister died in 2005-08-29.   Social History: Last updated: 16-Jul-2008  She has a son who lives   in [place].  She has never smoked.  She worked at [employer] for 41 years  Past Medical History: Current Problems:  PULMONARY HYPERTENSION, MILD (ICD-416.8) HYPERTENSION (ICD-401.9) DIASTOLIC DYSFUNCTION (ICD-429.9) CORONARY ARTERY DISEASE (ICD-414.00) DIVERTICULITIS, HX OF (ICD-V12.79) GOUT, UNSPECIFIED (ICD-274.9) IDDM (ICD-250.01) PERIPHERAL NEUROPATHY (ICD-356.9) GERD (ICD-530.81 Coronary Artery Stent 06/21/2007 Platinum protocol study.  Patent at follow up.  Vital Signs:  Patient profile:   77 year old female Height:      64 inches Weight:      162 pounds BMI:     27.91 Pulse rate:   69 / minute Pulse rhythm:   regular Resp:     18 per minute BP sitting:   130 / 60  (left arm) Cuff size:   large  Vitals Entered By: Vikki Ports (June 24, 2009 9:38 AM) CC: Surgical clearance -Pancreatectomy  Comments Pt. is not taking Isosorbide on a regular basis. She takes once in awhile   Physical Exam  General:  Well developed, well nourished, in no acute distress. Head:  normocephalic and atraumatic Eyes:  PERRLA/EOM intact; conjunctiva and lids normal. Lungs:  Clear bilaterally to auscultation and percussion. Heart:  Normal S1 and S2.  SEM 2/6.  No DM.  No rub.  PMI non displaced. Abdomen:  No masses felt by me.  No obvious organomegaly. Extremities:  No clubbing or cyanosis.  Trace edema. Neurologic:  Alert and oriented x 3.   Cardiac Cath  Procedure date:  07/26/2008  Findings:        ANGIOGRAPHIC DATA:   1. The left main is free of critical disease.   2. The LAD is severely diseased proximally, then totally occluded.   3. The internal mammary to the distal LAD appears to be intact, and       fills both antegrade and retrograde.  There is no significant       obstruction.  In filling retrograde, it also does dump into the       diagonal which has some ostial disease.   4. The saphenous vein graft to the diagonal is intact, but there is       some progression of disease with about 40% narrowing in the       proximal portion of the graft.  This appears to be progressed from       the previous study.   5. The circumflex is a vessel that has been stented a year ago in the       PLATINUM trial.  The stent appears to be widely patent without       significant narrowing.  There is no significant side branch       occlusion.  There is less than 0% narrowing.   6. The right coronary artery is a moderate-sized vessel.  There is       some mild diffuse luminal irregularities throughout the artery.       Importantly, there is about 40% narrowing distally, this appears to       be progressed from the previous study.  However, the stenosis is       not critical and just represents modest progression of plaque.  The       lumen size as well in excess of 2.5 mm and so significant flow       reduction would not be expected.  The distal right consists of a       posterior descending and posterolateral system, and these branches       have mild luminal irregularity without significant obstruction.       The continuation branch after the first posterolateral branch has       about 30% narrowing.      CONCLUSION:   1. Continued patency of the internal mammary to the left anterior       descending.   2. Continued patency of the saphenous vein graft to the diagonal with       mild progression of the graft proximally.   3. Continued patency of the previously placed stent from 1 year        earlier.   4. Mild progression of disease in th  Cardiac Cath  Procedure date:  06/24/2009  Findings:      NSR. WNL.  CXR  Procedure date:  04/19/2009  Findings:  CHEST - 2 VIEW    Comparison: 07/25/2008    Findings:   Normal heart size status post CABG.   Atherosclerotic calcifications of mildly tortuous thoracic aorta.   Pulmonary vascularity normal.   Question mild COPD changes.   No pulmonary infiltrates or pleural effusions.   Bones appear demineralized.    IMPRESSION:   Status post CABG.   Questionable COPD changes.   No acute abnormalities.    Read By:  Lollie Marrow,  M.D.   Released By:  Lollie Marrow,  M.D.  MRI EXAM  Procedure date:  02/15/2009  Findings:       IMPRESSION:    1.  Fatty infiltration of the liver.   2.  There are two simple appearing cyst within the left hepatic   lobe.   3.  Three T2 hyperintense structures within the pancreatic   parenchyma do not exhibit enhancement on the postcontrast studies.   If there is a history of pancreatitis then these likely represent   small pseudocysts.  Differential considerations include benign   branch duct IPMN or serous cystadenoma. A follow-up examination at   6 months with contrast enhanced MRI is recommended.    Read By:  Rosealee Albee,  M.D.   Released By:  Rosealee Albee,  M.D.  Impression & Recommendations:  Problem # 1:  CORONARY ARTERY DISEASE (ICD-414.00)  She has remained stable.  Her symptoms have been the same since restudy.  Doubt that restudy would change approach.  Not low risk, but not high risk either.  Would remain on ASA as she has a DES in the circumflex artery.  We would like to follow.  Beta blockers should continue in the hospital.  She stopped plavix last week.  Would suggest ICU after first night to watch fluid status with mild pulmonary hypertension and probable diastolic function abnormalities. Her updated medication list for this problem includes:     Plavix 75 Mg Tabs (Clopidogrel bisulfate) .Marland Kitchen... Take one tablet by mouth daily    Coreg 12.5 Mg Tabs (Carvedilol) .Marland Kitchen... Take 1 tablet by mouth two times a day    Isosorbide Mononitrate Cr 60 Mg Xr24h-tab (Isosorbide mononitrate) .Marland Kitchen... Take one tablet dailly    Nitrolingual 0.4 Mg/spray Soln (Nitroglycerin) ..... One spray under tongue every 5 minutes as needed for chest pain---may repeat times three    Aspirin 81 Mg Tbec (Aspirin) .Marland Kitchen... Take one tablet by mouth daily    Norvasc 10 Mg Tabs (Amlodipine besylate) .Marland Kitchen... 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  See reports.  Surgery is not optional here.  Problem # 3:  PULMONARY HYPERTENSION, MILD (ICD-416.8) Mild at cath.  Monitor  Problem # 4:  GOUT, UNSPECIFIED (ICD-274.9) FLare at present being managed by primary MD. The following medications were removed from the medication list:    Uloric 40 Mg Tabs (Febuxostat) .Marland Kitchen... Take 1 tablet by mouth once a day as needed Her updated medication list for this problem includes:    Colcrys 0.6 Mg Tabs (Colchicine) .Marland Kitchen... Take 1 tablet by mouth three times a day until symptoms are gone  Problem # 5:  IDDM (ICD-250.01) followed by Dr. Lucianne Muss. Her updated medication list for this problem includes:    Benicar 40 Mg Tabs (Olmesartan medoxomil) .Marland Kitchen... Take 1 tablet by mouth once a day    Aspirin 81 Mg Tbec (Aspirin) .Marland KitchenMarland KitchenMarland KitchenMarland Kitchen  Take one tablet by mouth daily    Novolog 100 Unit/ml Soln (Insulin aspart) ..... In the insulin pump  Problem # 6:  HYPERCHOLESTEROLEMIA  IIA (ICD-272.0) refuses statin treatment.    Patient Instructions: 1)  Your physician recommends that you schedule a follow-up appointment in: 2 MONTHS 2)  Your physician recommends that you continue on your current medications as directed. Please refer to the Current Medication list given to you today.

## 2010-05-15 NOTE — Procedures (Signed)
Summary: Insulin therapy Pump/[facility]  Insulin therapy Pump/[facility]   Imported By: Sherian Rein 04/25/2009 16:20:36  _____________________________________________________________________  External Attachment:    Type:   Image     Comment:   External Document

## 2010-05-15 NOTE — Progress Notes (Signed)
Summary: pt having surgery and wants make sure MD knows  Phone Note Call from Patient Call back at Home Phone [phone]   Caller: Patient Reason for Call: Talk to Nurse, Talk to Doctor Summary of Call: pt is having some surgery where they are gonna drain her stomach and pancreas and that's all she can tell us and she wants to make sure Dr. Riley Kill knew about it. Initial call taken by: Omer Jack,  November 22, 2009 3:35 PM  Follow-up for Phone Call        spoke with pt, dr Othelia Pulling will be preforming the surgery tuesday at [facility]. will foward to dr Riley Kill Deliah Goody, RN  November 22, 2009 4:43 PM  Per pt calling would like for Lauen to give her a call [phone] Lorne Skeens  November 25, 2009 10:55 AM   The pt is having her abdominal area drained at [facility] tomorrow.  The pt just wanted to make sure that I received the message from Friday.     Follow-up by: Julieta Gutting, RN, BSN,  November 25, 2009 11:49 AM

## 2010-05-15 NOTE — Miscellaneous (Signed)
Summary: rx  Clinical Lists Changes  Medications: Added new medication of CIPROFLOXACIN HCL 500 MG  TABS (CIPROFLOXACIN HCL) Take 1 twice a dayfor 3 days - Signed Rx of CIPROFLOXACIN HCL 500 MG  TABS (CIPROFLOXACIN HCL) Take 1 twice a dayfor 3 days;  #6 x 0;  Signed;  Entered by: Rachael Fee MD;  Authorized by: Rachael Fee MD;  Method used: Print then Give to Patient    Prescriptions: CIPROFLOXACIN HCL 500 MG  TABS (CIPROFLOXACIN HCL) Take 1 twice a dayfor 3 days  #6 x 0   Entered and Authorized by:   Rachael Fee MD   Signed by:   Rachael Fee MD on 04/25/2009   Method used:   Print then Give to Patient   RxID:   [id number]

## 2010-05-15 NOTE — Letter (Signed)
Summary: [facility]   Imported By: Marylou Mccoy 10/08/2009 15:06:08  _____________________________________________________________________  External Attachment:    Type:   Image     Comment:   External Document

## 2010-05-15 NOTE — Progress Notes (Signed)
Summary: lab results  Phone Note Call from Patient Call back at Home Phone [phone]   Caller: Patient Summary of Call: Pt request call Initial call taken by: Judie Grieve,  Sep 10, 2009 11:25 AM  Follow-up for Phone Call        I spoke with the pt about her lab results.  The pt said she was scheduled to see Dr Lucianne Muss today but had the incorrect time written down.  The pt's appt has been rescheduled to Thursday with Dr Lucianne Muss.  I made the pt aware that she should have her labwork rechecked at that appt.   Follow-up by: Julieta Gutting, RN, BSN,  Sep 10, 2009 2:07 PM

## 2010-05-15 NOTE — Progress Notes (Signed)
Summary: clearance  Phone Note From Other Clinic   Caller: Nurse Summary of Call: FYI Per Florentina Addison [facility] pt is scheduled for the 11th of march at [facility]. pt needs clearance from Dr Riley Kill for procedure. ofc [id number] fax [phone] Initial call taken by: Edman Circle,  June 14, 2009 9:58 AM  Follow-up for Phone Call        RN left a message to [facility] regarding pt. needing surgical Clearance." Dr. Riley Kill not in office today or next week. RN will send this message to Lauren Dr. Rosalyn Charters nurse". Okay  with nurse. Pt is having surgery for removal of a mass in her pancreas scheduled for March 11/11. Ollen Gross, RN, BSN  June 14, 2009 10:58 AM   Additional Follow-up for Phone Call Additional follow up Details #1::        I left a message for Florentina Addison that this pt is not cleared for surgery.  Dr Riley Kill said he would need to see the pt a few days prior to surgery.   Julieta Gutting, RN, BSN  June 17, 2009 10:44 AM  I spoke with the pt and scheduled her to see Dr Riley Kill on 06/24/09 for clearance.  The pt said she had called [facility] to cancel her surgery for 06/21/09. I left a detailed message for Pat (surgery scheduler at [facility]) about this pt requiring clearance from Dr Riley Kill.  Julieta Gutting, RN, BSN  June 18, 2009 11:18 AM

## 2010-05-15 NOTE — Letter (Signed)
Summary: [facility]  [facility]   Imported By: Roderic Ovens 07/17/2009 14:33:06  _____________________________________________________________________  External Attachment:    Type:   Image     Comment:   External Document

## 2010-05-15 NOTE — Consult Note (Signed)
Summary: [facility]   [facility]   Imported By: Roderic Ovens 06/06/2009 16:24:21  _____________________________________________________________________  External Attachment:    Type:   Image     Comment:   External Document

## 2010-05-15 NOTE — Procedures (Signed)
Summary: Upper Endoscopy  Patient: [name] Note: All result statuses are Final unless otherwise noted.  Tests: (1) Upper Endoscopy (EGD)   EGD Upper Endoscopy       DONE     [facility]     [address] [place], [place]  [address]           ENDOSCOPY PROCEDURE REPORT           PATIENT:  [name], [name]  MR#:  [id number]     BIRTHDATE:  05-29-1929, 79 yrs. old  GENDER:  female           ENDOSCOPIST:  Rachael Fee, MD     Referred by:  Haskell Riling, M.D.           PROCEDURE DATE:  04/25/2009     PROCEDURE:  Upper endoscopic ultrasound with FNA     ASA CLASS:  Class II     INDICATIONS:  abdominal discomfort; recent MRI suggesting several     small cystic structures in pancreas           MEDICATIONS:   Fentanyl 125 mcg IV, Versed 10 mg IV, Cipro 400mg      IV           DESCRIPTION OF PROCEDURE:   After the risks benefits and     alternatives of the procedure were thoroughly explained, informed     consent was obtained.     <<PROCEDUREIMAGES>>           normal otherwise (see image1, image2, image6, image7, image8,     image9, image10, image12, and image13).    Retroflexion was not     performed.  The scope was then withdrawn from the patient and the     procedure completed.           COMPLICATIONS:  None           Endoscopic findings (limited views with redial and linear     echoendoscopes):     1. Normal esophagus, stomach, duodenum           EUS findings:     1. 5.90mm mixed solid/cystic lesion in body/tail of pancreas that     appears to communicate with main pancreatic duct. The lesion had     irregular outer borders.  It was sampled with 2 passes of  a 22     guage Echo Tip EUS FNA needle using color Doppler to avoid     significant blood vessels.     2. Otherwise normal pancreatic parenchyma throughout head, neck,     body and tail.     3. Main pancreatic duct was slightly ectatic appearing but not     dilated.     4. No peripancreatic or  celiac adenopathy.     5. Gallbladder normal.     6. CBD normal: non-dilated and no filling defects.     7. Limited views of liver, spleen, portal and splenic vessels were     all normal.           Impression:     Single 5.27mm mixed solid/cystic lesion (predominantly solid) in     body/tail of pancreas.  Ddx cystic neoplasm, Islet Cell neoplasm.     Await final cytology.  If non-diagnostic, I favor repeat MRI of     pancreas in 3 months.     She can resume plavix today. She will complete 3 days of cirpo  twice a day.           ______________________________     Rachael Fee, MD           n.     eSIGNED:   Rachael Fee at 04/25/2009 08:51 AM           Janus Molder, [id number]  Note: An exclamation mark (!) indicates a result that was not dispersed into the flowsheet. Document Creation Date: 04/25/2009 8:51 AM _______________________________________________________________________  (1) Order result status: Final Collection or observation date-time: 04/25/2009 08:32 Requested date-time:  Receipt date-time:  Reported date-time:  Referring Physician:   Ordering Physician: Rob Bunting [phone]) Specimen Source:  Source: Launa Grill Order Number: [id number] Lab site:

## 2010-05-15 NOTE — Assessment & Plan Note (Signed)
Summary: [name]   Visit Type:  3 months follow up  CC:  Weakness- no apetite.  History of Present Illness: Cardiac  wise is stable.  Has had multiple issues related to her pancreatic surgery.  Still has a tube draining, and had to be admitted by Dr. Lucianne Muss for ongoing problems related to low magnesium.  No chest pain.  Tolerating cardiac meds well.  Prefers fish oil to statins.    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 Take 1 Tablet By Mouth Once A Day 2)  Plavix 75 Mg Tabs (Clopidogrel Bisulfate) .... Take One Tablet By Mouth Daily 3)  Coreg 12.5 Mg Tabs (Carvedilol) .... Take 1 Tablet By Mouth Two Times A Day 4)  Neurontin 300 Mg Caps (Gabapentin) .... Take 1 Capsule By Mouth Three Times A Day 5)  Nitrolingual 0.4 Mg/spray Soln (Nitroglycerin) .... One Spray Under Tongue Every 5 Minutes As Needed For Chest Pain---May Repeat Times Three 6)  Aspirin 81 Mg Tbec (Aspirin) .... Take One Tablet By Mouth Daily 7)  Norvasc 10 Mg Tabs (Amlodipine Besylate) .... Take 1 Tablet By Mouth Once A Day 8)  Vitamin D 50,000 Units .... Once A Week 9)  Stool Softener 100 Mg Caps (Docusate Sodium) .... As Needed 10)  Fish Oil 1000 Mg Caps (Omega-3 Fatty Acids) .... Take 1 Capsule By Mouth Once A Day 11)  Novolog 100 Unit/ml Soln (Insulin Aspart) .... in The Insulin Pump 12)  Acetaminophen 325 Mg  Tabs (Acetaminophen) .... As Needed 13)  Prenatal  Vitamins 0.8 Mg Tabs (Prenatal Multivit-Min-Fe-Fa) .... Take 1 Tablet By Mouth Once A Day 14)  Magnesium 250 Mg Tabs (Magnesium) .... Take 1/2tablet Two Times A Day 15)  Loperamide Hcl 2 Mg Caps (Loperamide Hcl) .... As Needed 16)  Bifera 28 Mg Tabs (Polysacch Fe Cmp-Fe Heme Poly) .... Take 1 Tablet By Mouth Once A Day 17)  Colcrys 0.6 Mg Tabs (Colchicine) .... Take 1 Tablet By Mouth 2 Tmes A Day 18)  Promethazine Hcl 12.5 Mg Supp (Promethazine Hcl) .... As Needed 19)  Metronidazole 250 Mg Tabs (Metronidazole) .... Take 1 Tablet By Mouth Three Times A Day  Allergies: 1)  ! Morphine 2)  ! Codeine 3)  ! * Statins Drugs 4)  ! * Magnessium  Vital Signs:  Patient profile:   77 year old female Height:      64 inches Weight:      147.75 pounds BMI:     25.45 Pulse rate:   76 / minute Pulse rhythm:   regular Resp:     18 per minute BP sitting:   120 / 56  (left arm) Cuff size:   large  Vitals Entered By: Vikki Ports (March 14, 2010 2:05 PM)  Physical Exam  General:  Well developed, well nourished, in no acute distress. Head:  normocephalic and atraumatic Eyes:  PERRLA/EOM intact; conjunctiva and lids normal. Lungs:  Clear bilaterally to auscultation and percussion. Heart:  Non-displaced PMI, chest non-tender; regular rate and rhythm, S1, S2 without murmurs, rubs or gallops. Carotid upstroke normal, no bruit. Extremities:  No clubbing or cyanosis. Neurologic:  Alert and oriented x 3.   EKG  Procedure date:  03/14/2010  Findings:      NSR.  WNL.   Impression & Recommendations:  Problem # 1:  CORONARY ARTERY DISEASE (ICD-414.00)  Stable at present.  Continue medical therapy.  Can likely stop plavix going forward. Will continue for now.   The following medications were removed from the medication list:    Isosorbide Mononitrate Cr 60 Mg Xr24h-tab (Isosorbide mononitrate) ..... Stop Her updated medication list for this problem includes:    Plavix 75 Mg Tabs (Clopidogrel  bisulfate) .Marland Kitchen... Take one tablet by mouth daily    Coreg 12.5 Mg Tabs (Carvedilol) .Marland Kitchen... Take 1 tablet by mouth two times a day    Nitrolingual 0.4 Mg/spray Soln (Nitroglycerin) ..... One spray under tongue every 5 minutes as needed for chest pain---may repeat times three    Aspirin 81 Mg Tbec (Aspirin) .Marland Kitchen... Take one tablet by mouth daily    Norvasc 10 Mg Tabs (Amlodipine besylate) .Marland Kitchen... 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   Problem # 3:  HYPERCHOLESTEROLEMIA  IIA (ICD-272.0) prefers fish oil to statin Orders: EKG w/ Interpretation (93000)  Problem # 4:  HYPOMAGNESEMIA (ICD-275.2) following with Dr. Lucianne Muss.  Patient Instructions: 1)  Your physician recommends that you schedule a follow-up appointment in: 6 MONTHS. The office will mail you a reminder letter 2 months prior appointment date. 2)  Your physician recommends that you continue on your current medications as directed. Please refer to the Current Medication list given to you today.

## 2010-05-15 NOTE — Progress Notes (Signed)
  Faxed 12 lead over to [facility] [phone] [facility]  July 31, 2009 12:55 PM

## 2010-05-16 ENCOUNTER — Other Ambulatory Visit (HOSPITAL_COMMUNITY): Payer: Self-pay | Admitting: Surgery

## 2010-05-16 DIAGNOSIS — IMO0002 Reserved for concepts with insufficient information to code with codable children: Secondary | ICD-10-CM

## 2010-05-20 ENCOUNTER — Ambulatory Visit (HOSPITAL_COMMUNITY)
Admission: RE | Admit: 2010-05-20 | Discharge: 2010-05-20 | Disposition: A | Discharge status: Home / Self Care | Payer: Medicare Other | Source: Ambulatory Visit | Attending: Surgery | Admitting: Surgery

## 2010-05-20 DIAGNOSIS — K651 Peritoneal abscess: Secondary | ICD-10-CM | POA: Insufficient documentation

## 2010-05-20 DIAGNOSIS — IMO0002 Reserved for concepts with insufficient information to code with codable children: Secondary | ICD-10-CM

## 2010-05-28 ENCOUNTER — Ambulatory Visit (INDEPENDENT_AMBULATORY_CARE_PROVIDER_SITE_OTHER): Payer: Medicare Other | Admitting: Internal Medicine

## 2010-05-28 DIAGNOSIS — K921 Melena: Secondary | ICD-10-CM

## 2010-05-29 ENCOUNTER — Telehealth: Payer: Self-pay | Admitting: Cardiology

## 2010-05-29 ENCOUNTER — Encounter (INDEPENDENT_AMBULATORY_CARE_PROVIDER_SITE_OTHER): Payer: Self-pay | Admitting: *Deleted

## 2010-06-09 NOTE — Consult Note (Signed)
[name], [name]                 ACCOUNT NO.:  [id number]  MEDICAL RECORD NO.:  [id number]           PATIENT TYPE:  AMB  LOCATION:  [place].                        FACILITY: CLINIC.  PHYSICIAN:  Lionel December, M.D.    DATE OF BIRTH:  [birth year]  DATE OF CONSULTATION: DATE OF DISCHARGE:  05/20/2010                                OFFICE VISIT>   PRESENTING COMPLAINT:  Rectal bleeding and tarry stools.  SUBJECTIVE:  [name] is a 77 year old Caucasian female patient of Dr. Kelby Fam who is here for scheduled visit.  She called the office 2 days ago requesting to be seen.  I last saw her in November 2011, pertaining to problems with recurrent pancreatic fluid collection reviewed in detail under past medical history.  She states she has been doing better as far as her pancreatic problem is concerned.  She is noted a very little drainage in the last several days.  She has an appointment at [facility] on June 05, 2009 and June 05, 2010, and she is hopeful that the tube will be removed.  She states she first noted a blood with a bowel movements about 3 weeks ago.  She feels it was a moderate to large in amount.  Since then every couple of days, she would see blood with her bowel movements.  She has not been constipated or having to strain and similarly, she has not had diarrhea.  This morning, her stool was black.  Previously, it has always been bright red.  She also does not report any anorectal discomfort. She has some soreness in epigastric region, but she has had lot more pain in the past.  She denies nausea, vomiting, heartburn or dysphagia. She states her appetite has improved with medication.  She states she had lost 15 pounds, but she has gained most of this weight back.  She denies postural symptoms, but she does complain of feeling very weak and fatigued and having no energy.  She states she was given iron infusion by Dr. Lucianne Muss about a month ago.  She does not remember  if her hemoglobin was low and what her iron count was.  At that time, she had not been passing any blood per rectum.  CURRENT MEDICATIONS: 1. Magnesium 250 mg p.o. daily. 2. Plavix 75 mg p.o. daily. 3. Gabapentin 300 mg p.o. daily. 4. ASA 81 mg daily. 5. Vitamin D 50,000 units once a week. 6. Benicar 40 mg daily. 7. Amlodipine 5 mg daily. 8. She is on insulin pump and receiving boluses at mealtime. 9. Fish oil 1 g daily. 10.Prenatal MVI daily. 11.Phenergan 25 mg p.o. daily p.r.n. 12.Nitroglycerine spray p.r.n.  PAST MEDICAL HISTORY:  She has diabetes mellitus for possibly over 10 years duration.  She is under the care of Dr. Lucianne Muss.  She has peripheral neuropathy which interesting was diagnosed long before her diabetes was.  She has coronary artery disease.  She had two-vessel CABG in 1999.  She suffered infarcts in March 2009, and had graft to left circumflex artery.  She has history of gout, hypertension and hyperlipidemia.  She had small colonic adenoma removed from  her ascending colon in 1998.  Her most recent exam was in July 2008, at [facility] by me and she had very tortuous colon with few diverticula at sigmoid colon, cluster at junction of sigmoid and descending and one diverticulum at ascending colon.  However, she did not have any polyps or other abnormalities. Random biopsies of sigmoid colon were negative for microscopic or collagenous colitis.  She also has had problems with hypomagnesemia. She has had tonsillectomy, tacking up of bladder.  She has had bilateral surgery for correction of ptosis in 2003.  She also has had hysterectomy with BSO.  She had EGD few years ago revealing few erosions.  Last year, she was found to have a small carcinoid lesion in the tail of pancreas.  She had distal pancreatitis by Dr. Luretha Murphy in April 2011.  This was complicated by a leak and cyst formation.  It did not resolve with percutaneous drainage.  She subsequently had ERCP  with stenting by Dr. Othelia Pulling of [facility].  She had the stent removed several weeks later, however, she has required percutaneous drainage for the last 4 months or so.  Drain had to be repositioned at least on one occasion.  The patient currently is not having any draining from the catheter.  Last CT was in December 2011, revealing significant decrease in fluid collection and she had one study on May 20, 2010, or last week, but I cannot find any report in the system.  ALLERGIES:  NK.  OBJECTIVE:  VITAL SIGNS:  Weight 152 pounds, in November she weighed 153.2 pounds, she is 64 inches tall, pulse 76 per minute, blood pressure 122/70 and temp is 97.6. EYES:  Conjunctivae does not appear to be pale.  Sclerae are nonicteric. MOUTH:  Oropharyngeal mucosa is normal. NECK:  No neck masses or thyromegaly noted. CARDIAC:  With regular rhythm.  Normal S1 and S2.  No murmur or gallop noted. LUNGS:  Clear to auscultation. ABDOMEN:  Protuberant.  She has percutaneous drain entering abdominal wall in epigastric region.  There is no fluid in the container.  Bowel sounds are normal.  Abdomen is soft except she has firmness or indurated area in epigastric region.  She does not have a mass-like she had before. RECTAL:  Very dark, almost black stool which is strongly heme positive. EXTREMITIES:  No peripheral edema or clubbing noted.  ASSESSMENT:  [name] is a 77 year old Caucasian female with multiple medical problems who has been struggling with recurrent post distal pancreatectomy fluid collection requiring prolonged percutaneous drainage who presents with 3-week history of intermittent hematochezia and she also has had tarry stools.  The patient received iron infusion few weeks ago, but it is unknown if her hemoglobin was low or not.  Her last colonoscopy was in July 2008.  She appears to be bleeding lot more than one would expect with hemorrhoidal bleeding.  However, she is on aspirin and  Plavix and therefore at risk for accelerated bleeding.  She could also have colitis, neoplasm or bleeding from mid or upper GI tract.  Presently, she appears to be hemodynamically stable.  RECOMMENDATIONS: 1. The patient advised to discontinue Plavix, but to stay on aspirin.     We will check with her cardiologist tomorrow to make sure that they     are in agreement. 2. We will start her on a Protonix 40 mg p.o. q.a.m. prescription     given for 30 with 5 refills. 3. We will get a CBC today.  We  will also try to get lab studies from     Dr. Remus Blake office.  The patient will call us with a progress report.  Should she notice rectal bleeding, we will consider GI bleeding scan otherwise may consider EGD.     Lionel December, M.D.     NR/MEDQ  D:  05/28/2010  T:  05/29/2010  Job:  [id number]  cc:   Kirk Ruths, M.D. Fax: [phone]  Electronically Signed by Lionel December M.D. on 06/09/2010 12:23:03 PM

## 2010-06-10 NOTE — Progress Notes (Signed)
Summary: plavix on hold  Phone Note From Other Clinic   Caller: tammy office  [phone]  Request: Talk with Nurse Summary of Call: pt plavix is on hold per dr. Karilyn Cota. is this o.k. ts.  Initial call taken by: Lorne Skeens,  May 29, 2010 2:51 PM  Follow-up for Phone Call        spoke with Tammy who states pt was having rectal bleeding and Plavix was stopped.   I informed her that Dr Riley Kill is not in the office again until Tuesday and I will forward information to him for his review.  Of note - Dr Riley Kill does mention in his last office note that it is likely going forward she can d/c her Plavix however there is no timeline mentioned. Follow-up by: Charolotte Capuchin, RN,  May 29, 2010 3:17 PM  Additional Follow-up for Phone Call Additional follow up Details #1::        This should be ok.  Her DES is widely patent, and she  is well more than a year, so plavix could be kept on hold.  She should remain on ASA, 81mg .   Additional Follow-up by: Ronaldo Miyamoto, MD, [facility],  June 02, 2010 4:52 PM    Additional Follow-up for Phone Call Additional follow up Details #2::    Left message on Tammy's voicemail with Dr Rosalyn Charters recommendations.  Julieta Gutting, RN, BSN  June 03, 2010 4:48 PM  New/Updated Medications: PLAVIX 75 MG TABS (CLOPIDOGREL BISULFATE) Take one tablet by mouth daily--ON HOLD

## 2010-06-11 ENCOUNTER — Other Ambulatory Visit (INDEPENDENT_AMBULATORY_CARE_PROVIDER_SITE_OTHER): Payer: Self-pay | Admitting: Internal Medicine

## 2010-06-11 DIAGNOSIS — K862 Cyst of pancreas: Secondary | ICD-10-CM

## 2010-06-12 ENCOUNTER — Other Ambulatory Visit (HOSPITAL_COMMUNITY): Payer: Medicare Other

## 2010-06-18 ENCOUNTER — Other Ambulatory Visit (INDEPENDENT_AMBULATORY_CARE_PROVIDER_SITE_OTHER): Payer: Self-pay | Admitting: Internal Medicine

## 2010-06-19 ENCOUNTER — Other Ambulatory Visit (INDEPENDENT_AMBULATORY_CARE_PROVIDER_SITE_OTHER): Payer: Self-pay | Admitting: Internal Medicine

## 2010-06-24 LAB — GLUCOSE, CAPILLARY
Glucose-Capillary: 108 mg/dL — ABNORMAL HIGH (ref 70–99)
Glucose-Capillary: 113 mg/dL — ABNORMAL HIGH (ref 70–99)
Glucose-Capillary: 118 mg/dL — ABNORMAL HIGH (ref 70–99)
Glucose-Capillary: 138 mg/dL — ABNORMAL HIGH (ref 70–99)
Glucose-Capillary: 141 mg/dL — ABNORMAL HIGH (ref 70–99)
Glucose-Capillary: 170 mg/dL — ABNORMAL HIGH (ref 70–99)
Glucose-Capillary: 190 mg/dL — ABNORMAL HIGH (ref 70–99)
Glucose-Capillary: 215 mg/dL — ABNORMAL HIGH (ref 70–99)
Glucose-Capillary: 216 mg/dL — ABNORMAL HIGH (ref 70–99)
Glucose-Capillary: 261 mg/dL — ABNORMAL HIGH (ref 70–99)
Glucose-Capillary: 276 mg/dL — ABNORMAL HIGH (ref 70–99)
Glucose-Capillary: 403 mg/dL — ABNORMAL HIGH (ref 70–99)
Glucose-Capillary: 437 mg/dL — ABNORMAL HIGH (ref 70–99)
Glucose-Capillary: 80 mg/dL (ref 70–99)
Glucose-Capillary: 92 mg/dL (ref 70–99)

## 2010-06-24 LAB — CULTURE, ROUTINE-ABSCESS

## 2010-06-24 LAB — CBC
HCT: 25.6 % — ABNORMAL LOW (ref 36.0–46.0)
HCT: 26 % — ABNORMAL LOW (ref 36.0–46.0)
HCT: 26.6 % — ABNORMAL LOW (ref 36.0–46.0)
Hemoglobin: 8.3 g/dL — ABNORMAL LOW (ref 12.0–15.0)
MCH: 26.9 pg (ref 26.0–34.0)
MCHC: 31.9 g/dL (ref 30.0–36.0)
MCHC: 32 g/dL (ref 30.0–36.0)
MCHC: 32.3 g/dL (ref 30.0–36.0)
MCV: 82.8 fL (ref 78.0–100.0)
MCV: 84.2 fL (ref 78.0–100.0)
Platelets: 789 10*3/uL — ABNORMAL HIGH (ref 150–400)
Platelets: 818 10*3/uL — ABNORMAL HIGH (ref 150–400)
Platelets: 871 10*3/uL — ABNORMAL HIGH (ref 150–400)
RBC: 3.09 MIL/uL — ABNORMAL LOW (ref 3.87–5.11)
RBC: 3.16 MIL/uL — ABNORMAL LOW (ref 3.87–5.11)
RBC: 3.16 MIL/uL — ABNORMAL LOW (ref 3.87–5.11)
RDW: 15.8 % — ABNORMAL HIGH (ref 11.5–15.5)
RDW: 15.9 % — ABNORMAL HIGH (ref 11.5–15.5)
RDW: 16.1 % — ABNORMAL HIGH (ref 11.5–15.5)
RDW: 16.4 % — ABNORMAL HIGH (ref 11.5–15.5)
WBC: 18.5 10*3/uL — ABNORMAL HIGH (ref 4.0–10.5)
WBC: 21.8 10*3/uL — ABNORMAL HIGH (ref 4.0–10.5)
WBC: 26.4 10*3/uL — ABNORMAL HIGH (ref 4.0–10.5)

## 2010-06-24 LAB — BASIC METABOLIC PANEL
BUN: 5 mg/dL — ABNORMAL LOW (ref 6–23)
CO2: 20 mEq/L (ref 19–32)
Calcium: 8.9 mg/dL (ref 8.4–10.5)
Calcium: 8.9 mg/dL (ref 8.4–10.5)
Creatinine, Ser: 0.84 mg/dL (ref 0.4–1.2)
Creatinine, Ser: 1.01 mg/dL (ref 0.4–1.2)
GFR calc Af Amer: >60 mL/min (ref >60)
GFR calc non Af Amer: 53 mL/min — ABNORMAL LOW (ref >60)
GFR calc non Af Amer: >60 mL/min (ref >60)
Glucose, Bld: 173 mg/dL — ABNORMAL HIGH (ref 70–99)
Potassium: 4 mEq/L (ref 3.5–5.1)

## 2010-06-24 LAB — IRON AND TIBC
Iron: 18 ug/dL — ABNORMAL LOW (ref 42–135)
Iron: <10 ug/dL — ABNORMAL LOW (ref 42–135)
Saturation Ratios: 10 % — ABNORMAL LOW (ref 20–55)
TIBC: 178 ug/dL — ABNORMAL LOW (ref 250–470)
UIBC: 160 ug/dL

## 2010-06-24 LAB — COMPREHENSIVE METABOLIC PANEL
ALT: 12 U/L (ref 0–35)
AST: 18 U/L (ref 0–37)
Albumin: 2.7 g/dL — ABNORMAL LOW (ref 3.5–5.2)
Alkaline Phosphatase: 54 U/L (ref 39–117)
Alkaline Phosphatase: 69 U/L (ref 39–117)
BUN: 21 mg/dL (ref 6–23)
CO2: 23 mEq/L (ref 19–32)
Calcium: 9.2 mg/dL (ref 8.4–10.5)
Chloride: 102 mEq/L (ref 96–112)
Creatinine, Ser: 0.96 mg/dL (ref 0.4–1.2)
GFR calc Af Amer: 59 mL/min — ABNORMAL LOW (ref >60)
GFR calc non Af Amer: 56 mL/min — ABNORMAL LOW (ref >60)
Glucose, Bld: 149 mg/dL — ABNORMAL HIGH (ref 70–99)
Glucose, Bld: 268 mg/dL — ABNORMAL HIGH (ref 70–99)
Potassium: 4.2 mEq/L (ref 3.5–5.1)
Potassium: 4.7 mEq/L (ref 3.5–5.1)
Sodium: 132 mEq/L — ABNORMAL LOW (ref 135–145)
Total Bilirubin: 0.4 mg/dL (ref 0.3–1.2)
Total Protein: 6.6 g/dL (ref 6.0–8.3)

## 2010-06-24 LAB — CULTURE, BLOOD (ROUTINE X 2)
Culture  Setup Time: 201111160056
Culture: NO GROWTH

## 2010-06-24 LAB — URINALYSIS, ROUTINE W REFLEX MICROSCOPIC
Glucose, UA: NEGATIVE mg/dL
Nitrite: POSITIVE — AB
Specific Gravity, Urine: 1.012 (ref 1.005–1.030)
pH: 6 (ref 5.0–8.0)

## 2010-06-24 LAB — URINE MICROSCOPIC-ADD ON

## 2010-06-24 LAB — LIPASE, BLOOD: Lipase: 51 U/L (ref 11–59)

## 2010-06-24 LAB — RETICULOCYTES
Retic Ct Pct: 1.5 % (ref 0.4–3.1)
Retic Ct Pct: 1.9 % (ref 0.4–3.1)

## 2010-06-24 LAB — PHOSPHORUS: Phosphorus: 3.2 mg/dL (ref 2.3–4.6)

## 2010-06-24 LAB — MAGNESIUM: Magnesium: 1.3 mg/dL — ABNORMAL LOW (ref 1.5–2.5)

## 2010-06-24 LAB — GLUCOSE, RANDOM: Glucose, Bld: 386 mg/dL — ABNORMAL HIGH (ref 70–99)

## 2010-06-24 LAB — LIPID PANEL
Cholesterol: 146 mg/dL (ref 0–200)
HDL: 26 mg/dL — ABNORMAL LOW (ref >39)

## 2010-06-24 LAB — PROTIME-INR
INR: 1.15 (ref 0.00–1.49)
Prothrombin Time: 14.9 seconds (ref 11.6–15.2)

## 2010-06-24 LAB — VITAMIN B12: Vitamin B-12: 488 pg/mL (ref 211–911)

## 2010-06-29 LAB — CBC
HCT: 39.5 % (ref 36.0–46.0)
Platelets: 461 10*3/uL — ABNORMAL HIGH (ref 150–400)
RDW: 16.4 % — ABNORMAL HIGH (ref 11.5–15.5)

## 2010-06-29 LAB — BASIC METABOLIC PANEL
BUN: 14 mg/dL (ref 6–23)
Creatinine, Ser: 0.62 mg/dL (ref 0.4–1.2)
GFR calc non Af Amer: >60 mL/min (ref >60)
Glucose, Bld: 196 mg/dL — ABNORMAL HIGH (ref 70–99)
Potassium: 4.5 mEq/L (ref 3.5–5.1)

## 2010-06-29 LAB — APTT: aPTT: 25 seconds (ref 24–37)

## 2010-06-29 LAB — PROTIME-INR: Prothrombin Time: 13.7 seconds (ref 11.6–15.2)

## 2010-06-29 LAB — GLUCOSE, CAPILLARY
Glucose-Capillary: 159 mg/dL — ABNORMAL HIGH (ref 70–99)
Glucose-Capillary: 197 mg/dL — ABNORMAL HIGH (ref 70–99)

## 2010-06-29 LAB — CULTURE, ROUTINE-ABSCESS: Culture: NO GROWTH

## 2010-07-01 ENCOUNTER — Ambulatory Visit (INDEPENDENT_AMBULATORY_CARE_PROVIDER_SITE_OTHER): Payer: PRIVATE HEALTH INSURANCE | Admitting: Internal Medicine

## 2010-07-01 DIAGNOSIS — D509 Iron deficiency anemia, unspecified: Secondary | ICD-10-CM

## 2010-07-01 DIAGNOSIS — K922 Gastrointestinal hemorrhage, unspecified: Secondary | ICD-10-CM

## 2010-07-01 LAB — GLUCOSE, CAPILLARY
Glucose-Capillary: 200 mg/dL — ABNORMAL HIGH (ref 70–99)
Glucose-Capillary: 203 mg/dL — ABNORMAL HIGH (ref 70–99)
Glucose-Capillary: 209 mg/dL — ABNORMAL HIGH (ref 70–99)
Glucose-Capillary: 230 mg/dL — ABNORMAL HIGH (ref 70–99)
Glucose-Capillary: 256 mg/dL — ABNORMAL HIGH (ref 70–99)
Glucose-Capillary: 281 mg/dL — ABNORMAL HIGH (ref 70–99)
Glucose-Capillary: 305 mg/dL — ABNORMAL HIGH (ref 70–99)
Glucose-Capillary: 100 mg/dL — ABNORMAL HIGH (ref 70–99)
Glucose-Capillary: 104 mg/dL — ABNORMAL HIGH (ref 70–99)
Glucose-Capillary: 108 mg/dL — ABNORMAL HIGH (ref 70–99)
Glucose-Capillary: 118 mg/dL — ABNORMAL HIGH (ref 70–99)
Glucose-Capillary: 127 mg/dL — ABNORMAL HIGH (ref 70–99)
Glucose-Capillary: 128 mg/dL — ABNORMAL HIGH (ref 70–99)
Glucose-Capillary: 133 mg/dL — ABNORMAL HIGH (ref 70–99)
Glucose-Capillary: 137 mg/dL — ABNORMAL HIGH (ref 70–99)
Glucose-Capillary: 151 mg/dL — ABNORMAL HIGH (ref 70–99)
Glucose-Capillary: 152 mg/dL — ABNORMAL HIGH (ref 70–99)
Glucose-Capillary: 158 mg/dL — ABNORMAL HIGH (ref 70–99)
Glucose-Capillary: 189 mg/dL — ABNORMAL HIGH (ref 70–99)
Glucose-Capillary: 189 mg/dL — ABNORMAL HIGH (ref 70–99)
Glucose-Capillary: 226 mg/dL — ABNORMAL HIGH (ref 70–99)
Glucose-Capillary: 254 mg/dL — ABNORMAL HIGH (ref 70–99)
Glucose-Capillary: 75 mg/dL (ref 70–99)
Glucose-Capillary: 76 mg/dL (ref 70–99)
Glucose-Capillary: 91 mg/dL (ref 70–99)
Glucose-Capillary: 92 mg/dL (ref 70–99)
Glucose-Capillary: 93 mg/dL (ref 70–99)
Glucose-Capillary: 98 mg/dL (ref 70–99)
Glucose-Capillary: 127 mg/dL — ABNORMAL HIGH (ref 70–99)
Glucose-Capillary: 131 mg/dL — ABNORMAL HIGH (ref 70–99)
Glucose-Capillary: 133 mg/dL — ABNORMAL HIGH (ref 70–99)
Glucose-Capillary: 139 mg/dL — ABNORMAL HIGH (ref 70–99)
Glucose-Capillary: 140 mg/dL — ABNORMAL HIGH (ref 70–99)
Glucose-Capillary: 140 mg/dL — ABNORMAL HIGH (ref 70–99)
Glucose-Capillary: 141 mg/dL — ABNORMAL HIGH (ref 70–99)
Glucose-Capillary: 142 mg/dL — ABNORMAL HIGH (ref 70–99)
Glucose-Capillary: 145 mg/dL — ABNORMAL HIGH (ref 70–99)
Glucose-Capillary: 146 mg/dL — ABNORMAL HIGH (ref 70–99)
Glucose-Capillary: 147 mg/dL — ABNORMAL HIGH (ref 70–99)
Glucose-Capillary: 147 mg/dL — ABNORMAL HIGH (ref 70–99)
Glucose-Capillary: 148 mg/dL — ABNORMAL HIGH (ref 70–99)
Glucose-Capillary: 148 mg/dL — ABNORMAL HIGH (ref 70–99)
Glucose-Capillary: 149 mg/dL — ABNORMAL HIGH (ref 70–99)
Glucose-Capillary: 149 mg/dL — ABNORMAL HIGH (ref 70–99)
Glucose-Capillary: 149 mg/dL — ABNORMAL HIGH (ref 70–99)
Glucose-Capillary: 149 mg/dL — ABNORMAL HIGH (ref 70–99)
Glucose-Capillary: 152 mg/dL — ABNORMAL HIGH (ref 70–99)
Glucose-Capillary: 153 mg/dL — ABNORMAL HIGH (ref 70–99)
Glucose-Capillary: 155 mg/dL — ABNORMAL HIGH (ref 70–99)
Glucose-Capillary: 155 mg/dL — ABNORMAL HIGH (ref 70–99)
Glucose-Capillary: 156 mg/dL — ABNORMAL HIGH (ref 70–99)
Glucose-Capillary: 156 mg/dL — ABNORMAL HIGH (ref 70–99)
Glucose-Capillary: 157 mg/dL — ABNORMAL HIGH (ref 70–99)
Glucose-Capillary: 157 mg/dL — ABNORMAL HIGH (ref 70–99)
Glucose-Capillary: 160 mg/dL — ABNORMAL HIGH (ref 70–99)
Glucose-Capillary: 162 mg/dL — ABNORMAL HIGH (ref 70–99)
Glucose-Capillary: 162 mg/dL — ABNORMAL HIGH (ref 70–99)
Glucose-Capillary: 163 mg/dL — ABNORMAL HIGH (ref 70–99)
Glucose-Capillary: 165 mg/dL — ABNORMAL HIGH (ref 70–99)
Glucose-Capillary: 166 mg/dL — ABNORMAL HIGH (ref 70–99)
Glucose-Capillary: 168 mg/dL — ABNORMAL HIGH (ref 70–99)
Glucose-Capillary: 169 mg/dL — ABNORMAL HIGH (ref 70–99)
Glucose-Capillary: 172 mg/dL — ABNORMAL HIGH (ref 70–99)
Glucose-Capillary: 173 mg/dL — ABNORMAL HIGH (ref 70–99)
Glucose-Capillary: 185 mg/dL — ABNORMAL HIGH (ref 70–99)
Glucose-Capillary: 187 mg/dL — ABNORMAL HIGH (ref 70–99)
Glucose-Capillary: 188 mg/dL — ABNORMAL HIGH (ref 70–99)
Glucose-Capillary: 193 mg/dL — ABNORMAL HIGH (ref 70–99)
Glucose-Capillary: 208 mg/dL — ABNORMAL HIGH (ref 70–99)
Glucose-Capillary: 208 mg/dL — ABNORMAL HIGH (ref 70–99)
Glucose-Capillary: 218 mg/dL — ABNORMAL HIGH (ref 70–99)
Glucose-Capillary: 232 mg/dL — ABNORMAL HIGH (ref 70–99)
Glucose-Capillary: 239 mg/dL — ABNORMAL HIGH (ref 70–99)
Glucose-Capillary: 263 mg/dL — ABNORMAL HIGH (ref 70–99)
Glucose-Capillary: 58 mg/dL — ABNORMAL LOW (ref 70–99)
Glucose-Capillary: 98 mg/dL (ref 70–99)

## 2010-07-01 LAB — CBC
HCT: 34.2 % — ABNORMAL LOW (ref 36.0–46.0)
Hemoglobin: 11.4 g/dL — ABNORMAL LOW (ref 12.0–15.0)
Hemoglobin: 12.6 g/dL (ref 12.0–15.0)
Hemoglobin: 12.7 g/dL (ref 12.0–15.0)
Hemoglobin: 12.8 g/dL (ref 12.0–15.0)
MCHC: 33.3 g/dL (ref 30.0–36.0)
MCHC: 33.2 g/dL (ref 30.0–36.0)
MCHC: 33.3 g/dL (ref 30.0–36.0)
MCHC: 33.6 g/dL (ref 30.0–36.0)
MCV: 92.7 fL (ref 78.0–100.0)
MCV: 93.2 fL (ref 78.0–100.0)
Platelets: 569 10*3/uL — ABNORMAL HIGH (ref 150–400)
Platelets: 279 10*3/uL (ref 150–400)
Platelets: 359 10*3/uL (ref 150–400)
RBC: 3.66 MIL/uL — ABNORMAL LOW (ref 3.87–5.11)
RBC: 4.05 MIL/uL (ref 3.87–5.11)
RBC: 4.13 MIL/uL (ref 3.87–5.11)
RBC: 4.15 MIL/uL (ref 3.87–5.11)
RDW: 15.3 % (ref 11.5–15.5)
RDW: 15.1 % (ref 11.5–15.5)
RDW: 15.5 % (ref 11.5–15.5)
WBC: 22.7 10*3/uL — ABNORMAL HIGH (ref 4.0–10.5)
WBC: 16.7 10*3/uL — ABNORMAL HIGH (ref 4.0–10.5)
WBC: 33.8 10*3/uL — ABNORMAL HIGH (ref 4.0–10.5)

## 2010-07-01 LAB — BASIC METABOLIC PANEL
BUN: 9 mg/dL (ref 6–23)
BUN: 10 mg/dL (ref 6–23)
CO2: 26 mEq/L (ref 19–32)
CO2: 29 mEq/L (ref 19–32)
Calcium: 8.6 mg/dL (ref 8.4–10.5)
Calcium: 8.8 mg/dL (ref 8.4–10.5)
Calcium: 9.2 mg/dL (ref 8.4–10.5)
Chloride: 100 mEq/L (ref 96–112)
Chloride: 103 mEq/L (ref 96–112)
Creatinine, Ser: 0.54 mg/dL (ref 0.4–1.2)
Creatinine, Ser: 0.6 mg/dL (ref 0.4–1.2)
Creatinine, Ser: 0.6 mg/dL (ref 0.4–1.2)
Creatinine, Ser: 0.72 mg/dL (ref 0.4–1.2)
GFR calc Af Amer: >60 mL/min (ref >60)
GFR calc Af Amer: >60 mL/min (ref >60)
GFR calc Af Amer: >60 mL/min (ref >60)
GFR calc non Af Amer: >60 mL/min (ref >60)
GFR calc non Af Amer: >60 mL/min (ref >60)
GFR calc non Af Amer: >60 mL/min (ref >60)
Glucose, Bld: 166 mg/dL — ABNORMAL HIGH (ref 70–99)
Glucose, Bld: 163 mg/dL — ABNORMAL HIGH (ref 70–99)
Sodium: 134 mEq/L — ABNORMAL LOW (ref 135–145)
Sodium: 130 mEq/L — ABNORMAL LOW (ref 135–145)
Sodium: 136 mEq/L (ref 135–145)
Sodium: 138 mEq/L (ref 135–145)

## 2010-07-01 LAB — DIFFERENTIAL
Basophils Absolute: 0 10*3/uL (ref 0.0–0.1)
Basophils Relative: 0 % (ref 0–1)
Eosinophils Absolute: 0.5 10*3/uL (ref 0.0–0.7)
Eosinophils Absolute: 0 10*3/uL (ref 0.0–0.7)
Lymphocytes Relative: 9 % — ABNORMAL LOW (ref 12–46)
Lymphs Abs: 3.7 10*3/uL (ref 0.7–4.0)
Monocytes Relative: 2 % — ABNORMAL LOW (ref 3–12)
Neutro Abs: 23.9 10*3/uL — ABNORMAL HIGH (ref 1.7–7.7)
Neutrophils Relative %: 75 % (ref 43–77)
Neutrophils Relative %: 89 % — ABNORMAL HIGH (ref 43–77)

## 2010-07-01 LAB — COMPREHENSIVE METABOLIC PANEL
ALT: 24 U/L (ref 0–35)
AST: 28 U/L (ref 0–37)
Albumin: 3.7 g/dL (ref 3.5–5.2)
CO2: 29 mEq/L (ref 19–32)
Calcium: 9.6 mg/dL (ref 8.4–10.5)
Chloride: 103 mEq/L (ref 96–112)
GFR calc Af Amer: >60 mL/min (ref >60)
GFR calc non Af Amer: 53 mL/min — ABNORMAL LOW (ref >60)
Sodium: 141 mEq/L (ref 135–145)

## 2010-07-01 LAB — TYPE AND SCREEN
ABO/RH(D): O POS
Antibody Screen: NEGATIVE

## 2010-07-01 LAB — APTT: aPTT: 26 seconds (ref 24–37)

## 2010-07-01 LAB — MRSA PCR SCREENING: MRSA by PCR: NEGATIVE

## 2010-07-04 ENCOUNTER — Other Ambulatory Visit (HOSPITAL_COMMUNITY): Payer: Self-pay | Admitting: Internal Medicine

## 2010-07-04 ENCOUNTER — Other Ambulatory Visit (HOSPITAL_BASED_OUTPATIENT_CLINIC_OR_DEPARTMENT_OTHER): Payer: Self-pay | Admitting: Internal Medicine

## 2010-07-04 DIAGNOSIS — Z139 Encounter for screening, unspecified: Secondary | ICD-10-CM

## 2010-07-07 ENCOUNTER — Ambulatory Visit (INDEPENDENT_AMBULATORY_CARE_PROVIDER_SITE_OTHER): Payer: Medicare Other | Admitting: Internal Medicine

## 2010-07-10 ENCOUNTER — Ambulatory Visit (HOSPITAL_COMMUNITY)
Admission: RE | Admit: 2010-07-10 | Discharge: 2010-07-10 | Disposition: A | Discharge status: Home / Self Care | Payer: Medicare Other | Source: Ambulatory Visit | Attending: Internal Medicine | Admitting: Internal Medicine

## 2010-07-10 ENCOUNTER — Other Ambulatory Visit: Payer: Self-pay | Admitting: Hematology & Oncology

## 2010-07-10 ENCOUNTER — Encounter (HOSPITAL_BASED_OUTPATIENT_CLINIC_OR_DEPARTMENT_OTHER): Payer: Medicare Other | Admitting: Hematology & Oncology

## 2010-07-10 ENCOUNTER — Ambulatory Visit (HOSPITAL_COMMUNITY): Payer: Medicare Other

## 2010-07-10 DIAGNOSIS — Z139 Encounter for screening, unspecified: Secondary | ICD-10-CM

## 2010-07-10 DIAGNOSIS — D709 Neutropenia, unspecified: Secondary | ICD-10-CM

## 2010-07-10 DIAGNOSIS — Z794 Long term (current) use of insulin: Secondary | ICD-10-CM

## 2010-07-10 DIAGNOSIS — E119 Type 2 diabetes mellitus without complications: Secondary | ICD-10-CM

## 2010-07-10 DIAGNOSIS — D649 Anemia, unspecified: Secondary | ICD-10-CM

## 2010-07-10 LAB — CBC WITH DIFFERENTIAL (CANCER CENTER ONLY)
BASO%: 0.4 % (ref 0.0–2.0)
LYMPH%: 29.6 % (ref 14.0–48.0)
MCH: 30.9 pg (ref 26.0–34.0)
MCV: 94 fL (ref 81–101)
MONO#: 1.6 10*3/uL — ABNORMAL HIGH (ref 0.1–0.9)
MONO%: 8.1 % (ref 0.0–13.0)
Platelets: 395 10*3/uL (ref 145–400)
RDW: 15.4 % (ref 11.1–15.7)
WBC: 19.1 10*3/uL — ABNORMAL HIGH (ref 3.9–10.0)

## 2010-07-10 LAB — CONVERTED CEMR LAB
Basophils Absolute: 0 10*3/uL (ref 0.0–0.1)
Eosinophils Relative: 2 % (ref 0–5)
HCT: 27.9 % — ABNORMAL LOW (ref 36.0–46.0)
Lymphocytes Relative: 28 % (ref 12–46)
Lymphs Abs: 8 10*3/uL — ABNORMAL HIGH (ref 0.7–4.0)
Neutro Abs: 17.9 10*3/uL — ABNORMAL HIGH (ref 1.7–7.7)
Neutrophils Relative %: 62 % (ref 43–77)
Platelets: 540 10*3/uL — ABNORMAL HIGH (ref 150–400)
WBC: 28.7 10*3/uL — ABNORMAL HIGH (ref 4.0–10.5)

## 2010-07-10 LAB — CHCC SATELLITE - SMEAR

## 2010-07-11 ENCOUNTER — Encounter (HOSPITAL_BASED_OUTPATIENT_CLINIC_OR_DEPARTMENT_OTHER): Payer: Medicare Other | Admitting: Internal Medicine

## 2010-07-11 ENCOUNTER — Ambulatory Visit (HOSPITAL_COMMUNITY)
Admission: RE | Admit: 2010-07-11 | Discharge: 2010-07-11 | Disposition: A | Discharge status: Home / Self Care | Payer: Medicare Other | Source: Ambulatory Visit | Attending: Internal Medicine | Admitting: Internal Medicine

## 2010-07-11 DIAGNOSIS — I10 Essential (primary) hypertension: Secondary | ICD-10-CM | POA: Insufficient documentation

## 2010-07-11 DIAGNOSIS — D509 Iron deficiency anemia, unspecified: Secondary | ICD-10-CM | POA: Insufficient documentation

## 2010-07-11 DIAGNOSIS — K921 Melena: Secondary | ICD-10-CM | POA: Insufficient documentation

## 2010-07-11 DIAGNOSIS — E785 Hyperlipidemia, unspecified: Secondary | ICD-10-CM | POA: Insufficient documentation

## 2010-07-11 DIAGNOSIS — K922 Gastrointestinal hemorrhage, unspecified: Secondary | ICD-10-CM

## 2010-07-11 DIAGNOSIS — E119 Type 2 diabetes mellitus without complications: Secondary | ICD-10-CM | POA: Insufficient documentation

## 2010-07-11 DIAGNOSIS — Z7982 Long term (current) use of aspirin: Secondary | ICD-10-CM | POA: Insufficient documentation

## 2010-07-11 DIAGNOSIS — D649 Anemia, unspecified: Secondary | ICD-10-CM

## 2010-07-11 HISTORY — PX: ESOPHAGOGASTRODUODENOSCOPY: SHX1529

## 2010-07-14 LAB — SPEP & IFE WITH QIG
Alpha-2-Globulin: 11.4 % (ref 7.1–11.8)
Beta Globulin: 5.7 % (ref 4.7–7.2)
Gamma Globulin: 16.8 % (ref 11.1–18.8)
IgG (Immunoglobin G), Serum: 1310 mg/dL (ref 694–1618)

## 2010-07-14 LAB — DIRECT ANTIGLOBULIN TEST (NOT AT ARMC): DAT IgG: NEGATIVE

## 2010-07-14 LAB — RETICULOCYTES (CHCC): Retic Ct Pct: 1.3 % (ref 0.4–3.1)

## 2010-07-14 LAB — HAPTOGLOBIN: Haptoglobin: 159 mg/dL (ref 16–200)

## 2010-07-14 LAB — IRON AND TIBC: %SAT: 16 % — ABNORMAL LOW (ref 20–55)

## 2010-07-22 NOTE — Op Note (Signed)
  NAMEEDWYNA, DANGERFIELD                 ACCOUNT NO.:  [id number]  MEDICAL RECORD NO.:  [id number]           PATIENT TYPE:  O  LOCATION:  [place]                          FACILITY:  [facility]  PHYSICIAN:  Lionel December, M.D.    DATE OF BIRTH:  08-16-1929  DATE OF PROCEDURE:  07/11/2010 DATE OF DISCHARGE:                              OPERATIVE REPORT   PROCEDURE:  Esophagogastroduodenoscopy.  INDICATION:  [name] is a 77 year old Caucasian female with multiple medical problems who also has iron-deficiency anemia.  She experienced both rectal bleeding as well as melena.  Her last colonoscopy was in July 2008 and she had diverticulosis.  She was recently seen in the office and her stool was strongly heme positive.  She is undergoing diagnostic EGD.  The patient is on low-dose ASA.  Her Plavix was discontinued recently.  Procedure risks were reviewed with the patient. Informed consent was obtained.  MEDICATIONS FOR CONSCIOUS:  Sedation Cetacaine spray for oropharyngeal topical anesthesia, Demerol 50 mg IV, and Versed 5 mg IV.  FINDINGS:  Procedure performed in endoscopy suite.  The patient's vital signs and O2 sat were monitored during the procedure and remained stable.  The patient was placed in left lateral recumbent position and Pentax videoscope was passed to oropharynx without any difficulty into esophagus.  Esophagus mucosa of the esophagus normal.  GE junction was located at 39 cm from the incisors and was serrated or wavy, but no other abnormalities noted.  Stomach, it was emptied and distended very well with insufflation. Folds of proximal stomach are normal.  Along the posterior wall at gastric body was extrinsic impression.  Mucosa in this area revealed mosaic pattern consistent with edema but no erosions, ulcers, or other abnormalities were noted.  This was a small scar in the antral region, however, no erosions or ulcer crater was noted.  Pyloric channel was patent.  Fundus and  cardia were examined by retroflexing scope were normal.  Duodenum, bulbar mucosa was normal.  Scope was passed in second part of the duodenum where mucosa and folds were normal.  Endoscope was withdrawn.  The patient tolerated the procedure well.  FINAL DIAGNOSES: 1. No lesion noted in the upper gastrointestinal tract to account for     the patient's gastrointestinal bleed. 2. Extrinsic impression at gastric body along the posterior wall felt     to be due to residual cyst or pancreatic pseudocyst.  RECOMMENDATIONS:  She will resume her usual diet and meds including ASA. Should she experience an episode of rectal bleeding or melena, we will consider further workup.  We will also wait for the lab studies from Dr. Gustavo Lah office before we determine whether we should do a capsule study or just monitor for the time being.     Lionel December, M.D.     NR/MEDQ  D:  07/11/2010  T:  07/12/2010  Job:  [id number]  cc:   Kirk Ruths, M.D. Fax: [phone]  Rose Phi. [name], M.D. Fax: [phone]  Dr. Lucianne Muss  Electronically Signed by Lionel December M.D. on 07/22/2010 11:36:45 PM

## 2010-07-22 NOTE — H&P (Signed)
[name], [name]                 ACCOUNT NO.:  [id number]  MEDICAL RECORD NO.:  [id number]           PATIENT TYPE: AMB.  LOCATION: [place].                   [facility]  PHYSICIAN:  Lionel December, M.D.    DATE OF BIRTH:  June 20, 1929  DATE: 07/01/2010.                              HISTORY & PHYSICAL   PRESENTING COMPLAINT:  Followup for pancreatic problems.  The patient has history of post distal pancreatectomy leak followed by cyst collection.  She had drain removed on June 12, 2010.  History of melena and rectal bleeding.  HISTORY OF PRESENT ILLNESS:  [name] is a 77 year old Caucasian female, patient of Dr. Lorie Apley, who was last seen by me on May 28, 2010, with history of melena and rectal bleeding.  Her hemoglobin is low.  She was advised to hold off her Plavix.  She has not had anymore melena or rectal bleed.  She states her stools have been dark, for which she has been on iron therapy.  She had lab studies by Dr. Lucianne Muss, her endocrinologist, but she does not know the result.  She was given iron infusion and she has an appointment to see Dr. Arlan Organ on July 07, 2010.  Since she had a percutaneous drain removed at [facility], she has noted very little drainage.  Overall, she feels much better.  She has noted some soreness but most of her pain is resolved.  She denies nausea, vomiting, fever, chills, shortness of breath, or chest pain. Her bowels are generally moving regularly.  Overall, she states she is feeling much better.  CURRENT MEDICATIONS: 1. Magnesium 400 mg p.o. daily. 2. Gabapentin 300 mg p.o. t.i.d. 3. ASA 81 mg daily. 4. Vitamin D 50,000 units once a week. 5. Benicar 40 mg daily. 6. Amlodipine 5 mg p.o. daily. 7. NovoLog via insulin pump. 8. Fish oil 1 g p.o. daily. 9. Prenatal vitamin. 10.MVI daily. 11.Phenergan 12.5 mg daily p.r.n. 12.Nitroglycerin spray p.r.n. 13.Pantoprazole 40 mg p.o. q.a.m. (she was given prescription at  our     last visit).  PAST MEDICAL HISTORY:  She has been diabetic for over 10 years.  She has been under Dr. Remus Blake care.  She is doing better since she has been on an insulin pump.  She has coronary artery disease.  She had 2-vessel CABG in 1999.  She suffered an infarct in March 2009, and had graft to LCA.  She has peripheral neuropathy which was diagnosed even before she found that she was diabetic.  She has gout, hypertension, hyperlipidemia.  History of colonic polyps.  First adenoma was removed in 1998.  Her last exam was in July 2008 at [facility], revealing multiple diverticula in the sigmoid colon and cluster at junction of sigmoid and descending as well as few at ascending colon.  However, she did not have any polyps.  She had random biopsies.  She was having diarrhea and they were negative for collagenous or microscopic colitis.  She has history of hypomagnesemia.  PRIOR SURGERIES:  Include tonsillectomy, tacking of bladder.  She has had bilateral eye surgery in 2003 for ptosis.  She has had hysterectomy with BSO a few years  ago.  She also had an EGD, revealing gastric erosions.  Last year, she was found to have single lesion in tail of pancreas.  On EUS FNA was a carcinoid.  She had distal pancreatectomy in April 2011, and this was complicated by leak and cyst formation.  She required percutaneous drainage for several weeks.  She also had ERCP at [facility] by Dr. Logan Bores with stenting and subsequently documented to have healed leak. She finally had her percutaneous drain removed about 3 weeks ago by Dr. Ulyses Southward.  ALLERGIES:  NK.  OBJECTIVE:  VITAL SIGNS:  Weight 155.7 pounds.  She is 75 inches tall, pulse 72 per minute, blood pressure 128/68, temp is 97.8. HEENT:  Conjunctivae pink.  Sclerae nonicteric.  Oropharyngeal mucosa is normal. NECK:  No neck masses are noted. CARDIAC:  Regular rhythm.  Normal S1 and S2.  No murmur or gallop noted. LUNGS:  Clear to  auscultation. ABDOMEN:  Protuberant.  Bowel sounds are normal.  She has small amount of granulation tissue at the site of percutaneous drain which is in midepigastric region.  She has scant amount of thin dressing but there is no border.  She has some fullness around this region, but nothing like what she had before.  No organomegaly or masses. RECTAL:  Reveals brown stool in the wall which is heme positive.  No peripheral edema noted.  ASSESSMENT: 1. Pancreatic cystic fluid collection.  It appears this process has     resolved.  On her last CT, the fluid collection was much smaller.     She had drain removed and it appears it is not reaccumulating.     Little bit of seepage may be due to granulation tissue.  She has an     appointment to see Dr. Luretha Murphy and we will get his opinion     whether this should be treated with silver nitrate or just watch     for now. 2. History of gastrointestinal bleed.  When I saw her about 5 weeks     ago, I thought perhaps she had diverticular bleed.  Her Plavix was     held.  She is on low-dose ASA.  Her stool is heme positive.     Therefore, we will start a workup by doing an EGD.  Her last     colonoscopy was in July 2008, and revealed multiple diverticula in     the left colon as well as few in the ascending colon.  RECOMMENDATIONS:  We will schedule her for diagnostic esophagogastroduodenoscopy at [facility] in near future.  We will request copy of labs from Dr. Remus Blake office and may consider repeating her CBC at the time of EGD unless she gets lab studies and she sees Dr. [name].     Lionel December, M.D.     NR/MEDQ  D:  07/01/2010  T:  07/02/2010  Job:  [id number]  cc:   Kirk Ruths, M.D. Fax: [phone]  Dr. Maude Leriche B. Daphine Deutscher, MD [address], [address] [place] [address]  Rose Phi. [name], M.D. Fax: [phone]  Electronically Signed by Lionel December M.D. on 07/22/2010 11:36:27 PM

## 2010-07-23 LAB — COMPREHENSIVE METABOLIC PANEL
ALT: 25 U/L (ref 0–35)
AST: 30 U/L (ref 0–37)
Alkaline Phosphatase: 117 U/L (ref 39–117)
Calcium: 9.4 mg/dL (ref 8.4–10.5)
GFR calc Af Amer: >60 mL/min (ref >60)
Glucose, Bld: 200 mg/dL — ABNORMAL HIGH (ref 70–99)
Potassium: 4.1 mEq/L (ref 3.5–5.1)
Sodium: 140 mEq/L (ref 135–145)
Total Protein: 6.5 g/dL (ref 6.0–8.3)

## 2010-07-23 LAB — CK TOTAL AND CKMB (NOT AT ARMC)
CK, MB: 4.6 ng/mL — ABNORMAL HIGH (ref 0.3–4.0)
Total CK: 116 U/L (ref 7–177)

## 2010-07-23 LAB — DIFFERENTIAL
Basophils Relative: 1 % (ref 0–1)
Eosinophils Absolute: 0.2 10*3/uL (ref 0.0–0.7)
Eosinophils Relative: 2 % (ref 0–5)
Lymphs Abs: 3.1 10*3/uL (ref 0.7–4.0)
Monocytes Absolute: 0.6 10*3/uL (ref 0.1–1.0)
Monocytes Relative: 4 % (ref 3–12)

## 2010-07-23 LAB — CBC
Hemoglobin: 12 g/dL (ref 12.0–15.0)
Hemoglobin: 12.6 g/dL (ref 12.0–15.0)
MCHC: 34 g/dL (ref 30.0–36.0)
MCHC: 33.9 g/dL (ref 30.0–36.0)
MCHC: 34.6 g/dL (ref 30.0–36.0)
MCV: 91.4 fL (ref 78.0–100.0)
MCV: 90.5 fL (ref 78.0–100.0)
RBC: 3.88 MIL/uL (ref 3.87–5.11)
RBC: 3.79 MIL/uL — ABNORMAL LOW (ref 3.87–5.11)
RBC: 4.01 MIL/uL (ref 3.87–5.11)
WBC: 10.2 10*3/uL (ref 4.0–10.5)
WBC: 14.5 10*3/uL — ABNORMAL HIGH (ref 4.0–10.5)

## 2010-07-23 LAB — BASIC METABOLIC PANEL
CO2: 26 mEq/L (ref 19–32)
Chloride: 105 mEq/L (ref 96–112)
GFR calc Af Amer: >60 mL/min (ref >60)
Potassium: 3.7 mEq/L (ref 3.5–5.1)
Sodium: 141 mEq/L (ref 135–145)

## 2010-07-23 LAB — GLUCOSE, CAPILLARY
Glucose-Capillary: 224 mg/dL — ABNORMAL HIGH (ref 70–99)
Glucose-Capillary: 139 mg/dL — ABNORMAL HIGH (ref 70–99)
Glucose-Capillary: 187 mg/dL — ABNORMAL HIGH (ref 70–99)
Glucose-Capillary: 244 mg/dL — ABNORMAL HIGH (ref 70–99)

## 2010-07-23 LAB — LIPID PANEL
Cholesterol: 192 mg/dL (ref 0–200)
HDL: 30 mg/dL — ABNORMAL LOW (ref >39)
Total CHOL/HDL Ratio: 6.4 RATIO
VLDL: 52 mg/dL — ABNORMAL HIGH (ref 0–40)

## 2010-07-23 LAB — POCT CARDIAC MARKERS
CKMB, poc: 2.4 ng/mL (ref 1.0–8.0)
Myoglobin, poc: 113 ng/mL (ref 12–200)

## 2010-07-23 LAB — CARDIAC PANEL(CRET KIN+CKTOT+MB+TROPI)
CK, MB: 2.9 ng/mL (ref 0.3–4.0)
Troponin I: <0.01 ng/mL (ref 0.00–0.06)

## 2010-07-23 LAB — TROPONIN I: Troponin I: 0.01 ng/mL (ref 0.00–0.06)

## 2010-07-23 LAB — APTT: aPTT: 26 seconds (ref 24–37)

## 2010-07-23 LAB — HEPARIN LEVEL (UNFRACTIONATED)
Heparin Unfractionated: 0.49 IU/mL (ref 0.30–0.70)
Heparin Unfractionated: <0.1 IU/mL — ABNORMAL LOW (ref 0.30–0.70)

## 2010-07-23 LAB — D-DIMER, QUANTITATIVE: D-Dimer, Quant: 0.38 ug/mL-FEU (ref 0.00–0.48)

## 2010-07-23 LAB — TSH: TSH: 1.961 u[IU]/mL (ref 0.350–4.500)

## 2010-08-06 ENCOUNTER — Other Ambulatory Visit: Payer: Self-pay | Admitting: Dermatology

## 2010-08-11 ENCOUNTER — Other Ambulatory Visit (HOSPITAL_COMMUNITY): Payer: Self-pay | Admitting: Urology

## 2010-08-11 DIAGNOSIS — N390 Urinary tract infection, site not specified: Secondary | ICD-10-CM

## 2010-08-14 ENCOUNTER — Ambulatory Visit (HOSPITAL_COMMUNITY)
Admission: RE | Admit: 2010-08-14 | Discharge: 2010-08-14 | Disposition: A | Discharge status: Home / Self Care | Payer: Medicare Other | Source: Ambulatory Visit | Attending: Urology | Admitting: Urology

## 2010-08-14 ENCOUNTER — Other Ambulatory Visit (HOSPITAL_COMMUNITY): Payer: Self-pay | Admitting: Urology

## 2010-08-14 DIAGNOSIS — N390 Urinary tract infection, site not specified: Secondary | ICD-10-CM

## 2010-08-14 DIAGNOSIS — R109 Unspecified abdominal pain: Secondary | ICD-10-CM | POA: Insufficient documentation

## 2010-08-26 NOTE — Assessment & Plan Note (Signed)
[facility]                            CARDIOLOGY OFFICE NOTE   IOANA, LOUKS                        MRN:          [id number]  DATE:06/20/2007                            DOB:          [birth year]    Ms. Bowditch is in for followup.  Since I last saw her, she has had some  intermittent episodes of chest discomfort.  They are slightly more than  they were in the past.  She takes a nitroglycerin and the symptoms go  away.  It has not been particularly exertional.  She has had some  problems with her medications recently in that she was started on Actos.  With the initiation of Actos, she said she had a little bit more  symptoms but then they called down when she stopped the Actos.  Whether  this has a relationship or not is not quite clear.  She denies  progressive shortness of breath, but she says she just does not feel all  that well.   Her current medications include Benicar 40 mg daily, omega 3 fish and  flaxseed oil, vitamin D daily.  Coral calcium, vitamin D, aspirin 81 mg,  magnesium 500 b.i.d. gabapentin 300 mg t.i.d., Plavix 75 mg daily,  amlodipine 5 mg daily, isosorbide mononitrate 60 mg daily, and insulin  pump.   PHYSICAL:  She is alert and oriented.  Weight is 163, the blood pressure 132/70, the pulse is 55.  The lung fields are clear.  The cardiac rhythm is regular without significant murmur.  Her pulses are equal bilaterally.   Electrocardiogram demonstrates normal sinus bradycardia, otherwise  within normal limits.   IMPRESSION:  1. Probable recurrent angina with some increase in symptoms.  2. Prior cardiac catheterization demonstrating patent internal mammary      to the left anterior descending and patent vein graft to the      diagonal with 60-70% focal stenosis January 19, 2006 with subsequent      myocardial perfusion imaging December 2008 with no ischemia, but      with increased symptoms.  3. Insulin dependent diabetes  mellitus.  4. Hypertension.  5. Gastroesophageal reflux.  6. Erosive gastritis.  7. Gout.  8. History of colonic polyps.  9. Diverticulosis.  10.History of small adenoma removed from the ascending colon in 1998.  11.History of tonsillectomy.  12.Status post appendectomy.  13.Bilateral salpingo-oophorectomy.  14.Peripheral neuropathy.   PLAN:  We discussed the options.  I plan to bring her in for possible  repeat cardiac catheterization and/or percutaneous intervention.  I have  discussed this with the patient and her son.  Risks, benefits,  alternatives have been discussed with the patient and she consents to  proceed.   ADDENDUM:   FAMILY HISTORY:  Mother died of CVA at age 90.  Her father died of lung  cancer at 32.  Her sisters subsequently died.   SOCIAL HISTORY:  The patient is widowed.  Her son lives in [place].  She has never smoked.  She worked for [employer] for 41 years.   REVIEW OF SYSTEMS:  There  has been no recent GI bleeding, cough, or  sputum production.  There have been no other major symptoms.     [name]. Riley Kill, MD, [facility]  Electronically Signed    TDS/MedQ  DD: 06/20/2007  DT: 06/20/2007  Job #: [id number]

## 2010-08-26 NOTE — Assessment & Plan Note (Signed)
[facility]                            CARDIOLOGY OFFICE NOTE   JAMIEKA, ROYLE                        MRN:          [id number]  DATE:09/29/2007                            DOB:          10/12/1929    [name] is in for a followup.  From the clinical standpoint, she is  stable.  She has not been having any shortness of breath or chest pain.  Unfortunately, she is feeling fatigued all that time.  Her sugars have  been really quite high, and she is using an insulin pump.  She has been  seen by Dr. Lucianne Muss just yesterday, she is also getting vitamin D  supplements.   On exam, her blood pressure is 120/64, the pulse is 88.  Lung fields are  clear.  The cardiac rhythm is regular.   She is now back on Actos.  It is possible that her Coreg may cause  fatigue.  She does not think this is cardiac in nature, and she has had  absolutely no chest pain.  I plan to see her back in followup in about 6  weeks.  Should she have any problems in the interim, she is to call me.  I have encouraged her also to follow up with Dr. Lucianne Muss.  Of note, we did  do a hemoglobin on her last month, her hemoglobin was 12.5.     [name]. Riley Kill, MD, [facility]  Electronically Signed    TDS/MedQ  DD: 09/29/2007  DT: 09/30/2007  Job #: [id number]

## 2010-08-26 NOTE — Assessment & Plan Note (Signed)
[facility]                            CARDIOLOGY OFFICE NOTE   BRAYA, HABERMEHL                        MRN:          [id number]  DATE:09/06/2007                            DOB:          03-30-1930    [name] is in for follow-up.  She yesterday had an episode of some  discomfort.  It was associated with nausea and she could feel the water  up in her throat.  She did not have any radiation to the arms.  She did  not break out in a sweat or have any other associated cardiac-type  symptoms.  She says mainly she has been tired and sleepy all of the  time.  She has very little energy.  She is not on diuretics currently.  She is on an insulin pump 24 hours a day and supplements that and her  sugars have been under better control.   On examination today the weight is 165 pounds, the blood pressure  130/68, the pulse is 62.  The lung fields are clear.  There is a minimal systolic ejection murmur.  There is not a definite tenderness in the abdomen or the epigastrium.  EXTREMITIES:  No edema.   The electrocardiogram demonstrates normal sinus rhythm with minor  nonspecific T-wave abnormality.   In summary, [name] has had an episode of midepigastric discomfort.  Her  EKG is not remarkable at the present time.  She does have a history of  erosive gastritis and she also has a history of anemia.  I would be  somewhat concerned about her symptoms and we are going to see her back  in follow-up in 3 weeks.  I will get a CBC.  We have renewed her  prescription for nitroglycerin.  I am not highly suspicious this is  cardiac, but if she has recurrences she knows to contact us promptly.  We will reassess when I see her back in 3 weeks.     [name]. Riley Kill, MD, [facility]  Electronically Signed    TDS/MedQ  DD: 09/06/2007  DT: 09/06/2007  Job #: [id number]   cc:   Patrica Duel, M.D.  Reather Littler, M.D.

## 2010-08-26 NOTE — Assessment & Plan Note (Signed)
[facility]                            CARDIOLOGY OFFICE NOTE   ROBYN, GALATI                        MRN:          [id number]  DATE:02/02/2007                            DOB:          [birth year]    [name] is in for a followup.  She has some occasional chest discomfort  for which she takes nitroglycerin.  She takes on average about 4 to 6  per month.  She says that the pattern has not changed.  It is not  clearly activity related, although it does help.  When she last  underwent catheterization in 2007, she had about a 70% circumflex  stenosis which we have been watching closely.  The plan has been to  consider percutaneous coronary intervention if her symptoms worsen.  It  is clearly not exertion related.  Because her blood pressure has been  elevated, Dr. Lucianne Muss has recently increased her Benicar from 20 to 40 mg.  She is also on an insulin pump which has been helping the situation.  She has lost about 5 pounds.   MEDICATIONS:  1. Klor-Con 10 mEq b.i.d.  2. Benicar 40 mg daily.  3. Avandia 2 mg daily.  4. Stool softener.  5. Plavix 75 mg daily.  6. Aspirin 81 mg daily.  7. Magnesium oxide 1 b.i.d.  8. Protonix 40 mg daily.  9. Humalog 10 units by pump.  10.Norvasc 10 mg 1/2 tablet daily.  11.Coreg 12.5 mg p.o. b.i.d.  12.Imdur 60 mg daily.  13.Neurontin 300 t.i.d.   PHYSICAL EXAMINATION:  She is alert and oriented in no acute distress.  Today the blood pressure is 154/76, the pulse was 64.  The lung fields are clear.  The cardiac rhythm is regular.  The extremities reveal no edema.   EKG reveals normal sinus rhythm with a nonspecific minor ST abnormality.   IMPRESSION:  1. Coronary disease with prior revascularization surgery with patent      internal mammary to the left anterior descending and patent vein      graft to the diagonal with progression of disease in the circumflex      and no critical disease in the right coronary  artery.  2. Insulin-dependent diabetes mellitus, improved.  3. History of hypomagnesemia.  4. Hypertension.  5. Non-insulin dependent diabetes.   PLAN:  1. Return to clinic in 6 weeks.  2. If there is any change in pattern she is to contact us promptly.  3. Her preference would be to manage this conservatively as long as we      can.     [name]. Riley Kill, MD, [facility]  Electronically Signed    TDS/MedQ  DD: 02/02/2007  DT: 02/03/2007  Job #: [id number]

## 2010-08-26 NOTE — Assessment & Plan Note (Signed)
[facility]                            CARDIOLOGY OFFICE NOTE   [name], [name]                        MRN:          [id number]  DATE:12/07/2007                            DOB:          [birth year]    [name] is in for followup.  In general, she is doing pretty well.  She  had really gotten into exercising on a regular basis, but she had a foot  problem.  She broke her foot, now is in a boot.  She desperately trying  to get more exercise and lose some of her weight.   She has not had recurrent chest pain.   Today, on exam her blood pressure is 140/68 and pulse is 60.  The lung  fields are clear and the cardiac rhythm is regular.   Overall, [name] appears to be doing reasonably well.  She has been  referred to the hematologist because of her white count, which has been  chronically mildly elevated.  She remains on a combination of aspirin  and clopidogrel.  In 1 year, we will cut down her aspirin to 81 mg.  Should she have any problems in the interim, she is to contact us and we  will see her promptly.  Her electrocardiogram today reveals normal  nonspecific ST-T wave abnormalities unchanged from prior tracings.     [name]. Riley Kill, MD, [facility]  Electronically Signed    TDS/MedQ  DD: 12/07/2007  DT: 12/08/2007  Job #: [id number]   cc:   Patrica Duel, M.D.

## 2010-08-26 NOTE — Cardiovascular Report (Signed)
NAMEJOHNNAE, IMPASTATO                 ACCOUNT NO.:  [id number]   MEDICAL RECORD NO.:  [id number]           PATIENT TYPE:   LOCATION:                                 FACILITY:   PHYSICIAN:  [name]. Riley Kill, MD, FACCDATE OF BIRTH:  1930-02-12   DATE OF PROCEDURE:  07/26/2008  DATE OF DISCHARGE:                            CARDIAC CATHETERIZATION   INDICATIONS:  [name] is well known to me.  She presented yesterday  with some chest pain.  On arrival, she had some inferior T-wave changes,  and also a rightward oriented axis.  A cardiac catheterization was  recommended for further evaluation.   PROCEDURE:  1. Left heart catheterization.  2. Selective coronary arteriography.  3. Saphenous vein graft angiography.  4. Selective left internal mammary angiography.   DESCRIPTION OF THE PROCEDURE:  The patient was brought to the cath lab,  prepped and draped in the usual fashion.  Through an anterior puncture,  the femoral artery was easily entered.  A 5-French sheath was initially  placed.  Views of the left and right coronary arteries were then  obtained.  Vein graft angiography and internal mammary angiography were  also performed.  Central aortic and left ventricular pressures were  measured.  Ventriculography was not performed to conserve contrast.  There were no major complications, and she was taken to the holding area  in satisfactory clinical condition.   HEMODYNAMIC DATA:  1. The central aortic pressure was 141/67.  2. RV pressure 138/10.  3. Mean pressure in the aorta was 96.  4. After equilibration, there was not a significant gradient on      pullback across the aortic valve.   ANGIOGRAPHIC DATA:  1. The left main is free of critical disease.  2. The LAD is severely diseased proximally, then totally occluded.  3. The internal mammary to the distal LAD appears to be intact, and      fills both antegrade and retrograde.  There is no significant      obstruction.  In filling  retrograde, it also does dump into the      diagonal which has some ostial disease.  4. The saphenous vein graft to the diagonal is intact, but there is      some progression of disease with about 40% narrowing in the      proximal portion of the graft.  This appears to be progressed from      the previous study.  5. The circumflex is a vessel that has been stented a year ago in the      PLATINUM trial.  The stent appears to be widely patent without      significant narrowing.  There is no significant side branch      occlusion.  There is less than 0% narrowing.  6. The right coronary artery is a moderate-sized vessel.  There is      some mild diffuse luminal irregularities throughout the artery.      Importantly, there is about 40% narrowing distally, this appears to      be progressed from  the previous study.  However, the stenosis is      not critical and just represents modest progression of plaque.  The      lumen size as well in excess of 2.5 mm and so significant flow      reduction would not be expected.  The distal right consists of a      posterior descending and posterolateral system, and these branches      have mild luminal irregularity without significant obstruction.      The continuation branch after the first posterolateral branch has      about 30% narrowing.   CONCLUSION:  1. Continued patency of the internal mammary to the left anterior      descending.  2. Continued patency of the saphenous vein graft to the diagonal with      mild progression of the graft proximally.  3. Continued patency of the previously placed stent from 1 year      earlier.  4. Mild progression of disease in the distal right coronary.   PLAN:  Continued medical therapy.  I will review these with the patient.  Given the right axis and the inferior T changes, we will go ahead and  get a D-dimer.      [name]. Riley Kill, MD, [facility]  Electronically Signed     TDS/MEDQ  D:  07/26/2008  T:   07/27/2008  Job:  [id number]   cc:   Patrica Duel, M.D.  [facility].

## 2010-08-26 NOTE — Assessment & Plan Note (Signed)
[facility]                            CARDIOLOGY OFFICE NOTE   ROSSETTA, KAMA                        MRN:          [id number]  DATE:11/29/2006                            DOB:          [birth year]    HISTORY OF PRESENT ILLNESS:  [name] is in for a followup visit. In  general, she is stable. She is off fluid pills. She has had rare  episodes of chest discomfort and she really does not wish to pursue any  type of interventional approach at this point in time. She denies chest  pain. She apparently did have a low magnesium level. Went to [employer]  and had an effusion but her magnesium remained low. She had another  infusion done at Dr. Remus Blake office and she is scheduled to go back for  lab work next week. I am not aware of what the potassium might have  been.   MEDICATIONS:  Klor-Con p.r.n., spironolactone p.r.n., vitamin D daily,  Benicar 20 mg daily, Avandia 2 mg daily, insulin pump, Neurontin 300 mg  t.i.d., Imdur 60 mg daily, Coreg 12.5 mg b.i.d., Norvasc 10 mg 1/2  tablet daily, Humalog 10, Protonix 40 mg daily, Magnesium oxide 400 mg  daily, aspirin 81 mg daily, Plavix 75 mg daily, and stool softener  daily.   PHYSICAL EXAMINATION:  GENERAL:  Cloie looks well. She is in no  distress. Blood pressure 134/68, pulse 86.  LUNGS:  Fields are clear.  CARDIAC:  Rhythm is regular.   LABORATORY DATA:  Electrocardiogram demonstrates normal sinus rhythm.  There is nonspecific T wave abnormality with acute TC at 445  milliseconds.   IMPRESSION:  1. Coronary artery disease with prior revascularization with patent      internal mammary and patent vein graft to the diagonal with      progression of disease in the circumflex and no critical disease in      the right coronary artery.  2. Insulin-dependent diabetes mellitus.  3. History of hypomagnesemia, being treated by Dr. Lucianne Muss.   PLAN:  1. She will continue to followup with Dr. Lucianne Muss.  2. Return  to cardiology clinic in 2 to 3 months in followup.  3. Consider PCI of the circumflex if progression of symptoms.  4. Continue medical therapy.     [name]. Riley Kill, MD, [facility]  Electronically Signed    TDS/MedQ  DD: 11/29/2006  DT: 11/29/2006  Job #: [id number]

## 2010-08-26 NOTE — H&P (Signed)
NAMEJOSELIN, [name]                 ACCOUNT NO.:  [id number]   MEDICAL RECORD NO.:  [id number]          PATIENT TYPE:  INP   LOCATION:  Aug 24, 2022                         FACILITY:  [facility]   PHYSICIAN:  [name]. Riley Kill, MD, FACCDATE OF BIRTH:  1930-03-14   DATE OF ADMISSION:  06/28/2007  DATE OF DISCHARGE:                              HISTORY & PHYSICAL   CHIEF COMPLAINT:  Chest pain.   HISTORY OF PRESENT ILLNESS:  This is a very delightful 77 year old well  known to me.  She was recently admitted to the hospital and underwent  cardiac catheterization.  She had increasing chest discomfort over the  past several weeks.  Subsequent to this, she was noted to have high  grade progressive lesion in the circumflex coronary artery and she was  randomized in the platinum study. Her diagonal vein graft, and internal  mammary to the LAD appeared to be patent and the right coronary was  noncritical.  She underwent stenting of this vessel without significant  complication.  Since discharge from the hospital she has had continued  recurrent episodes of substernal discomfort.  The patient is on an  insulin pump.  She called the office today, and her husband was  concerned.  She was brought to the emergency room at [facility], and  transferred here for further evaluation and admission.   PAST MEDICAL HISTORY:  Cardiac catheterization in 08/24/2003 at which time she  had right heart pressures including a pressure of 41 over 16 with a  wedge of 17. She has had reflux documented by a gastroesophageal study  including signs of erosive gastritis. She has a peripheral neuropathy of  20 years duration, insulin dependent diabetes mellitus and hypertension.  She has had gout, colonic polyps, and diverticuli. She had a small  adenoma removed from her colon in 1996/08/23.  She has had tonsillectomy and  appendectomy and bilateral salpingo-oophorectomy with hysterectomy and  bladder tacking procedure.   CURRENT  MEDICATIONS:  1. Include an insulin pump.  2. Plavix 75 mg daily.  3. Aspirin 325 mg daily.  4. Coreg 12.5 mg p.o. b.i.d.  5. Neurontin 300 mg p.o. t.i.d.  6. Norvasc 5 mg daily.  7. Isosorbide 60 mg daily.  8. Omega 369.  9. Vitamin D.  10.Benicar 40.  11.Calcium and magnesium.   FAMILY HISTORY:  Mother died of CVA at age 88.  Her father died of lung  cancer age 27. Her sister died in 08/23/2005.   SOCIAL HISTORY:  She has a significant other.  She has a son who lives  in [place].  She has never smoked.  She worked at [employer] for 41 years.   REVIEW OF SYSTEMS:  She has not had fevers or sweats.  She has not had a  cough or sputum production.  She denies any dysuria.  A complete review  of systems is done and is otherwise negative.   PHYSICAL EXAMINATION:  GENERAL:  She is alert and oriented and in no  distress.  She appears slightly pale.  VITAL SIGNS:  Blood pressure is 144/71, the pulse  is 96.  The  temperature 98.6 and respiratory rate 20.  HEAD AND NECK:  There is no jugular venous distention and there are no  carotid bruits noted bilaterally.  LUNGS:  The lung fields on examination are clear to auscultation and  percussion.  HEART:  The PMI is nondisplaced and there is a normal first and second  heart sound.  There is a 1/6 systolic ejection murmur noted.  ABDOMEN:  Abdomen is soft, groin sites are intact.  EXTREMITIES:  Distal pulses are intact.  NEUROLOGIC EXAM:  Nonfocal.  The sclerae are nonicteric.  Extraocular  muscles are intact.  Cranial nerves are nonfocal.   LABORATORY DATA:  Point of care markers reveals CK-MB of 1.5, troponin  of less than 0.5.  The sodium is 136, potassium 4.1, chloride 103, CO2  25, glucose 195, BUN 17, creatinine 0.78.  CBC reveals a white count of  17,000, hemoglobin is 12.1, hematocrit 35.2, lymphocytes of 15,  neutrophils are 17.  Chest x-ray was done at [facility].  Although the exact report is unavailable the preliminary wet read  has no  acute cardiopulmonary abnormality.   IMPRESSION:  1. Recurrent substernal chest pain which is not changed dramatically      since before and after percutaneous coronary intervention.  2. Recent stenting of circumflex coronary artery with excellent      angiographic result in the platinum trial.  3. Insulin dependent diabetes mellitus.  4. History of mild pulmonary hypertension.  5. Hypertension.  6. Prior surgeries as noted.  7. Unexplained leukocytosis.   PLAN:  1. Intravenous heparin drip.  2. Cardiac enzymes and D-dimer.  3. Continue aspirin and Plavix at present.  4. 2-D echo in the morning  5. The patient may require restudy.  6. Recheck CBC in the morning.      [name]. Riley Kill, MD, [facility]  Electronically Signed     TDS/MEDQ  D:  06/28/2007  T:  06/29/2007  Job:  [id number]

## 2010-08-26 NOTE — Discharge Summary (Signed)
[name], [name]                 ACCOUNT NO.:  [id number]   MEDICAL RECORD NO.:  [id number]          PATIENT TYPE:  INP   LOCATION:  6529                         FACILITY:  [facility]   PHYSICIAN:  [name]. Riley Kill, MD, FACCDATE OF BIRTH:  [birth year]   DATE OF ADMISSION:  06/21/2007  DATE OF DISCHARGE:  06/23/2007                               DISCHARGE SUMMARY   PRIMARY CARDIOLOGIST:  Bonnee Quin, MD.   PRIMARY CARE PHYSICIAN:  Reather Littler, MD.   PROCEDURES PERFORMED DURING HOSPITALIZATION:  1. Cardiac catheterization by Dr. Bonnee Quin on June 21, 2007.      a.     Preserved left ventricular function with mild anterior       apical wall motion abnormality, continued patency of the internal       mammary to LAD, continued patency of saphenous vein graft to the       diagonal, progression of disease in the native circumflex,       continued patency in the right coronary artery.      b.     Status post PCI to the circumflex using a platinum study       stent, reducing from 90% to 0%, this is a 2 x 5 x 18 mm stent.   FINAL DISCHARGE DIAGNOSES:  1. Angina.  2. Coronary artery disease.      a.     Status post percutaneous coronary intervention to the left       circumflex using a platinum study stent 2 by 5 by 18 mm.      b.     Coronary artery bypass grafting with limited left anterior       descending and vein graft to diagonal at unknown date.  3. Insulin-dependent diabetes with insulin pump.  4. Hypertension.  5. Gastroesophageal reflux disease.  6. Erosive gastritis.  7. Gout.  8. History of colonic polyps.  9. Diverticulosis.   HOSPITAL COURSE:  This is a 77 year old Caucasian female who was seen by  Dr. Riley Kill in his office on June 20, 2007, secondary to intermittent  episodes of chest discomfort, slightly more intense than they were prior  to her bypass grafting.  Nitroglycerin did help make symptoms go away,  they did not necessarily come with exertion.  The patient was  found to  be intolerant of statins.  The patient was started on Actos by her  primary care physician and she said she had more symptoms with starting  that and began not to feel well.  After seeing Dr. Riley Kill it was  decided to repeat her cardiac catheterization and possible PCI.  We  discussed risks and benefits with the patient who was willing to  proceed.   The patient was admitted, had cardiac catheterization on June 21, 2007,  with results described above.  Please see Dr. Rosalyn Charters cardiac  catheterization report for details.  The patient did undergo a PCI with  a Platinum study stent to the circumflex, stenosis reduced from 90% to  zero percent. The patient had cardiac enzymes cycled and she had mild  elevation in cardiac enzymes and we followed her closelysecondary to  this. Her initial cardiac enzymes were troponin 0.01 and 0.10,  subsequent troponin 0.26 and 0.26 respectively.  The patient's blood  pressure remained stable and she had no further complaints of angina.  The patient was seen and evaluated prior to discharge and found to be  stable.   DISCHARGE VITAL SIGNS:  Blood pressure 148/50, heart rate 60,  respirations 20, temperature 98.9, O2 sat 94% on room air.   LABS:  Troponin 0.01, 0.10, 0.26 and 0.17, sodium 141, potassium 3.9,  chloride 107, CO2 27.  BUN 15, creatinine 0.70, glucose 84, cholesterol  170, triglycerides 149, HDL 3.3, LDL 107.  Hemoglobin 12.9, hematocrit  37, white blood cells 7.3, platelets 256,000. At the time of discharge,  she was in sinus rhythm, rate in the 60s.   DISCHARGE MEDICATIONS:  1. Plavix 75 mg daily.  2. Coreg 12.5 mg twice a day.  3. Avapro 75 mg daily.  4. Neurontin 300 mg three times a day.  5. Norvasc 5 mg daily.  6. Isosorbide 60 mg daily  7. __________  8. Aspirin 325 mg daily.  9. Nitroglycerin 0.4 mg p.r.n. chest discomfort.  10.Omega 3/6/9.  11.Vitamin B3.  12.Calcium 1000 mg a day.  13  Vitamin D3 1000 units.   1. Magnesium 500 daily.   ALLERGIES:  1. MORPHINE.  2. CODEINE.  3. SHE IS INTOLERANT TO STATINS, CAUSING MYALGIAS.   FOLLOWUP PLANS AND APPOINTMENT:  1. The patient will follow up with Dr. Bonnee Quin on March 25th at      8:45 a.m.  2. The patient is to follow up with Dr. Lucianne Muss, primary care physician,      for continued medical management of insulin management.  3. The patient has been given post cardiac catheterization      instructions with particular emphasis on right port site for      evidence of bleeding, hematoma and signs of infection.   TIME SPENT WITH THE PATIENT:  To include admission time, 35 minutes.      Bettey Mare. Lyman Bishop, NP      [name]. Riley Kill, MD, [facility]  Electronically Signed    KML/MEDQ  D:  06/23/2007  T:  06/23/2007  Job:  [id number]   cc:   Reather Littler, M.D.

## 2010-08-26 NOTE — Assessment & Plan Note (Signed)
[facility]                            CARDIOLOGY OFFICE NOTE   [name], [name]                        MRN:          [id number]  DATE:03/26/2008                            DOB:          16-Jan-1930    [name] is in for a followup visit.  She says she is tired all the time.  She feels rather weak.  She saw Dr. Lucianne Muss earlier today and her  magnesium was 1 and they want to give her back and then gave her another  magnesium infusion.  She has been on oral magnesium.  Her potassium has  been normal.  She has not been on diuretic pill.  Etiology of this  remains unclear at the present time.  She denies any significant  diarrhea.  She has not been having chest pain, but she just feels tired  most of the time.  She has also been adjusted on her insulin, and  recently woke up sweaty and was sure she was hypoglycemic.   Today, on examination, the blood pressure is 136/74, the pulse is 72.  The lung fields are clear and the cardiac rhythm is regular.  The  extremities reveal no edema.   Electrocardiogram demonstrates normal sinus rhythm, there is nonspecific  T-wave abnormality, this is unchanged from 3 months earlier.   [name] appears to be stable from a cardiac standpoint.  She has not been  having much in the way of chest pain.  Her last echocardiogram suggested  preserved overall LV function.  My leaning would be in the direction of  going ahead and treating her hypomagnesemia.  We will see her back in  followup in 3 months.  I would favor not restudying her unless she had  clinical symptoms of ischemia, and for at least right now there is no  evidence to suggest this.  We will probably have her return in 3 months  and reassess her status at that time.     [name]. Riley Kill, MD, [facility]  Electronically Signed    TDS/MedQ  DD: 03/26/2008  DT: 03/27/2008  Job #: [id number]   cc:   Patrica Duel, M.D.  Reather Littler, M.D.

## 2010-08-26 NOTE — Assessment & Plan Note (Signed)
NAMEALUEL, SCHWARZ                  CHART#:  [id number]   DATE:  09/02/2006                       DOB:  December 22, 1929   PRESENTING COMPLAINT:  Lower abdominal pain and frequent bowel  movements.   SUBJECTIVE:  [name] is a 77 year old Caucasian female patient of Dr. Patrica Duel who is here for a scheduled visit.  She presents with a two  month history of crampy lower abdominal pain and as many as five bowel  movements per day.  She says by 10:00 this morning, she has already had  three BM's.  All of her stools are soft to normal.  She is also having  nocturnal pain.  She states that she has noted increase in stool volume.  She is very concerned that she has colon carcinoma.  She has not  experienced fevers or chills.  She denies melena, rectal bleeding.  She  has gained a couple of pounds in the last one year.  She states that her  heartburn is well controlled with therapy, but she does experience  nausea when she has abdominal cramps.  Her cramps are usually not  relieved with a bowel movement.  She also complains of feeling weak and  tired all the time.  Her glycemic control has been suboptimal.  She will  be going on an insulin pump in the near future.  She was seen by Dr.  Nobie Putnam for a right axillary adenitis and was given doxycycline b.i.d.  She says she has not been able to take 2 a day, she is taking 1.  She  still has four doses left.  This was started three weeks ago.  She  reports no change in her symptoms with this therapy.   Patient had EGD and incomplete colonoscopy in August, 2007.  At that  time, she presented with nausea and upper abdominal pain.  She had mild  changes of reflux esophagitis limited to GE junction and erosive antral  gastritis.  Her H. pylori serology was positive, and she was able to  take 10 days of prev pack.  Her colonoscopy was incomplete because of a  tortuous sigmoid colon and somewhat compromised by quality of prep.  She  declined to have a  barium enema and was interested in virtual  colonoscopy but has not been done to date.  Prior to endoscopic  evaluation, she also had hepatobiliary scan in June, 2007 with EF of  45%, was felt to be normal.  Her ultrasound was also in June, 2007 and  was negative for cholelithiasis or thickened walls or dilated bile duct.  She did have a fatty liver.  She has a history of colonic polyps.  She  had a small adenoma removed back in 1998.   She is on Neurontin 300 mg t.i.d., Humulin N 80 units and Humalog 10  units every morning and the same dose every evening, ASA 81 mg daily,  Norvasc 5 mg daily, Coreg, question dose, daily, isosorbide 30 mg daily,  spironolactone 50 mg daily, Plavix 75 mg daily, Benicar 20 mg daily, mag  oxide 4 mg b.i.d., vitamin D, question dose, daily, Protonix 40 mg  q.a.m. p.r.n., doxycycline 100 mg daily .   PAST MEDICAL HISTORY:  History of peripheral neuropathy which was  diagnosed three years before diagnosis of diabetes  mellitus, which was  17 years ago.  Her diabetes has been very difficult to control.  She has  coronary artery disease, status post CABG in February, 1999.  She has  not had any problems lately.  She had surgery on her left eye for  retinal detachment about six years ago.  She has a history of gout.  Her  GI history is reviewed above.  She has had tonsillectomy, appendectomy,  BSO with hysterectomy and tacking up of bladder.   ALLERGIES:  CODEINE, causing nausea and vomiting.   FAMILY HISTORY:  Negative for colorectal cancer.  Father died of lung  carcinoma at age 1.   SOCIAL HISTORY:  She is widowed.  She has one son who lives in  [place].  She is retired from [employer], where she worked for 41 years.  She has never smoked cigarettes and does not drink alcohol.   OBJECTIVE:  VITAL SIGNS:  Weight 171 pounds.  She is 5 feet 4 inches  tall.  Pulse 80 per minute, blood pressure 150/72, temp 97.7.  HEENT:  Conjunctivae is pink.  Sclerae are  anicteric.  Oropharyngeal  mucosa is normal.  NECK:  No neck masses are noted.  CARDIAC:  Regular rhythm.  Normal S1 and S2.  No murmur or gallop noted.  LUNGS:  Clear to auscultation.  ABDOMEN:  Protuberant.  Bowel sounds normal.  No bruits noted.  On  palpation, she has mild tenderness at LLQ without guarding or rebound.  She has some prominent left lobe of the liver, and right lobe is also  palpable easily.  It is firm but nontender.  Span is 15 cm.  Spleen is  not palpable.  RECTAL:  A scant amount of stool in the vault, which is guaiac negative.  No peripheral edema or clubbing noted.   ASSESSMENT:  Britiany is a 77 year old Caucasian female with multiple  medical problems who presents lower abdominal pain and frequent bowel  movements.  She has formed or semi-formed stools.  No history of  hematochezia.  She had attempted colonoscopy in August, 2007, which was  limited because of a very tortuous junction of the sigmoid and  descending colon, but she did have multiple diverticula.  I suspect we  are dealing with irritable bowel syndrome.  I doubt that we will find a  colonic neoplasm.  Nevertheless, her colon exam needs to be complete.   RECOMMENDATIONS:  1. Levsin/SL t.i.d. p.r.n.  She was informed of the potential side      effects.  2. Will request virtual colonoscopy.  If this is not improved by her      carrier, would consider repeat colonoscopy under fluoroscopy.       Lionel December, M.D.  Electronically Signed     NR/MEDQ  D:  09/02/2006  T:  09/03/2006  Job:  [id number]   cc:   Patrica Duel, M.D.

## 2010-08-26 NOTE — Discharge Summary (Signed)
NAMEATHA, MURADYAN                 ACCOUNT NO.:  [id number]   MEDICAL RECORD NO.:  [id number]          PATIENT TYPE:  INP   LOCATION:  4706                         FACILITY:  [facility]   PHYSICIAN:  [name]. Riley Kill, MD, FACCDATE OF BIRTH:  26-Jul-1929   DATE OF ADMISSION:  07/25/2008  DATE OF DISCHARGE:  07/27/2008                               DISCHARGE SUMMARY   PRIMARY CARDIOLOGIST:  Maisie Fus D. Riley Kill, MD, [facility]   PRIMARY CARE Rahsaan Weakland:  Reather Littler, MD   DISCHARGE DIAGNOSIS:  Unstable angina.   SECONDARY DIAGNOSES:  1. Coronary artery disease status post coronary artery bypass graft x2      in 1999.  2. Insulin-dependent diabetes mellitus, currently using insulin pump.  3. Hypertension.  4. Hyperlipidemia.  5. History of erosive gastritis.  6. Gastroesophageal reflux disease.  7. Gout.  8. History of colon polyps.  9. Diverticulosis.  10.Peripheral neuropathy.  11.Status post tonsillectomy, appendectomy, hysterectomy, and      bilateral salpingo-oophorectomy, and bladder tack.  12.Status post recent right foot stress fracture.   ALLERGIES:  MORPHINE, CODEINE, STATINS, and TETANUS TOXOID.   PROCEDURES:  Left heart cardiac catheterization performed on July 26, 2008, showing continued patency for a LIMA to the LAD as well as vein  graft to the diagonal with mild progression of disease in that graft  proximally.  She has continued patency at previously placed stent in the  left circumflex.  There is mild progression of disease in the distal  right coronary artery.  The patient was medically managed.   HISTORY OF PRESENT ILLNESS:  A 77 year old Caucasian female with the  above problem list.  She was recently seen in the clinic by Dr. Riley Kill  in June 21, 2008, with complaints of  3-4 episodes of chest discomfort,  which does occur with rest and exertion.  I was recommended that she  undergo catheterization, however, she wished to defer.  Unfortunately,  she continued to  have intermittent rest and exertional symptoms, the  worst of which occurred this past Sunday.  Because of progression, she  presented to [facility] ED on July 25, 2008, for evaluation.  In the  ED, ECG showed no acute changes, and she was admitted for the  evaluation.   HOSPITAL COURSE:  The patient ruled out for MI.  She underwent left  heart cardiac catheterization on July 27, 2007, revealing patency of  her 2 coronary artery bypass grafts as well as previously placed left  circumflex stent.  There was mild progression of disease in the vein  graft to the diagonal as well as in the distal right coronary artery.  Overall, however, it was felt that she could be medically managed.  She  has had no recurrent chest discomfort and has been reinstituted on Imdur  therapy.  She will be discharged home today in good condition.   Of note, Ms. Gossard has been seen by the diabetes coordinator who  reviewed her usage of her insulin pump.  Diabetes coordinator had some  concerns that perhaps Ms. Lipson was not able to dose this  appropriately  and has recommended close followup with Dr. Lucianne Muss.   DISCHARGE LABS:  Hemoglobin 12.0, hematocrit 35.4, WBC 10.2, and  platelets 232.  D-dimer is 0.38, sodium 141, potassium 3.7, chloride  105, CO2 26, BUN 13, creatinine 0.63, glucose 119, total bilirubin 0.4,  alkaline phosphatase 117, AST 30, ALT 25, total protein 6.5, albumin  3.3, calcium 9.0, hemoglobin A1c 7.9, CK 6, MB 2.9, and troponin-I 0.01.  Total cholesterol 192, triglycerides 259, HDL 30, LDL 110, and TSH  6.045.   DISPOSITION:  The patient will be discharged home today in good  condition.   FOLLOWUP PLANS AND APPOINTMENTS:  I have asked her to follow with Dr.  Lucianne Muss in next 1-2 weeks.  She has followup with Dr. Riley Kill on July 30, 2008, at 11:15 a.m.   DISCHARGE MEDICATIONS:  1. Aspirin 325 mg daily.  2. Benicar 40 mg daily.  3. Plavix 75 mg daily.  4. Coreg 12.5 mg b.i.d.  5.  Neurontin 300 mg t.i.d.  6. Norvasc 5 mg daily.  7. Imdur 60 mg daily.  8. Omega 3 one tab daily.  9. Calcium 1 tab daily.  10.Magnesium 1 tab daily.  11.Vitamin D 1 tab daily.  12.Insulin pump as directed.  13.Nitroglycerin 0.4 mg sublingual p.r.n. chest pain.   OUTSTANDING LAB STUDIES:  None.   DURATION OF DISCHARGE/ENCOUNTER:  Forty five minutes including physician  time.      Nicolasa Ducking, ANP      [name]. Riley Kill, MD, [facility]  Electronically Signed    CB/MEDQ  D:  07/27/2008  T:  07/28/2008  Job:  [id number]   cc:   Reather Littler, M.D.

## 2010-08-26 NOTE — Cardiovascular Report (Signed)
[name], [name]                 ACCOUNT NO.:  [id number]   MEDICAL RECORD NO.:  [id number]          PATIENT TYPE:  INP   LOCATION:  6529                         FACILITY:  [facility]   PHYSICIAN:  [name]. Riley Kill, MD, FACCDATE OF BIRTH:  02/12/1930   DATE OF PROCEDURE:  DATE OF DISCHARGE:                            CARDIAC CATHETERIZATION   INDICATIONS:  [name] is a very delightful 77 year old woman well-known to  me.  She has previously undergone revascularization surgery and known to  have a patent diagonal vein graft to the diagonal, patent internal  mammary to the LAD.  She had a 60-70% circumflex stenosis previously,  but in the last few weeks has developed increasing discomfort in the  chest.  It is worrisome for angina.  As a result, repeat cardiac  catheterization was recommended.   PROCEDURE:  1. Left heart catheterization.  2. Selective coronary arteriography.  3. Selective left ventriculography.  4. Saphenous vein graft angiography.  5. Selective left internal mammary angiography.  6. Percutaneous stenting of the circumflex coronary artery involving      the platinum protocol.   DESCRIPTION OF PROCEDURE:  The patient was brought to the cardiac  catheterization laboratory and prepped and draped in the usual fashion.  Through an anterior puncture the right femoral artery was easily  entered.  A 5 French sheath was then placed.  Views of the left and  right coronary arteries were obtained.  Saphenous vein graft and  internal mammary angiography were performed.  Central aortic and left  ventricular pressures were measured with the pigtail.  Ventriculography  was then performed in the RAO projection.  There was clear-cut  progression in the circumflex lesion.  We gave intracoronary  nitroglycerin, and this clearly demonstrated this.  As a result  preparations were made for percutaneous intervention.  I have spoken  with [name] yesterday, and I also spoke with her again  today and she  was agreeable to proceed.  The patient had had preprocedural chewable  aspirin.  We gave her 300 mg of oral clopidogrel.  Bivalirudin was used  as the anticoagulant.  The patient was randomized in the platinum  protocol.  With this a JL-4 guiding catheter was utilized after an  appropriate ACT was obtained.  A Prowater wire was placed down into the  artery.  Predilatation was done with a 2.25 mm balloon.  Following this  the vessel was stented using an 18 x 2.5 platinum study stent.  The  stent was deployed at 15 atmospheres.  Following this we post dilated  using a 3 mm Quantum Maverick balloon with dilatations up to 12  atmospheres throughout the course of the stent.  Followup angiographic  views were obtained after removal of the wire.  Intracoronary  nitroglycerin was given prior to final views.  Orthogonal views were  obtained per protocol.  There were no major complications.  Femoral  sheath was sewn into place and she was taken to the holding area in  satisfactory clinical condition.   HEMODYNAMIC DATA:  1. Central aortic pressure was 178/76.  2. LV  pressure 190/17.  3. There was not a significant gradient on pullback across aortic      valve being less than 10 mm.   ANGIOGRAPHIC DATA:  1. Ventriculography in the RAO projection reveals an ejection fraction      probably 55%.  There is some anteroapical minimal hypokinesis.  2. The left main is free of critical disease.  3. The LAD is totally occluded after a diagonal and diffusely diseased      proximally.  4. The saphenous vein graft to the diagonal is widely patent and      smooth.  It retrograde fills the mid LAD which has several lesions.  5. The internal mammary to the distal LAD appears to be intact with      vigorous flow down the vessel.  6. The circumflex comes off the left main and provides predominantly      one marg branch that is tortuous.  There were several sub branches      as well.  There is a  90% focal stenosis measuring about 10 mm in      length.  Following dilatation with an 18 mm stent and post      dilatation this was reduced to zero percent residual luminal      narrowing.  There was no evidence of edge tear.  7. The right coronary artery was a very large caliber vessel with      minimal luminal irregularity.  There is perhaps 20% narrowing at      the proximal PDA.   CONCLUSION:  1. Preserved left ventricular function with mild anteroapical wall      motion abnormality.  2. Continued patency of the internal mammary to the LAD (left anterior      descending).  3. Continued patency of the saphenous vein graft to the diagonal.  4. Progression of disease in a native circumflex with successful      percutaneous stenting.  5. Continued patency of the right coronary artery.   DISPOSITION:  The patient will be treated medically.  Aspirin and Plavix  will be given.  She is on chronic Plavix therapy.      [name]. Riley Kill, MD, [facility]  Electronically Signed     TDS/MEDQ  D:  06/21/2007  T:  06/22/2007  Job:  [id number]   cc:   Patrica Duel, M.D.  CV Lab  [name]. Riley Kill, MD, [facility]  Reather Littler, M.D.

## 2010-08-26 NOTE — Discharge Summary (Signed)
NAMEDEVLIN, MCVEIGH                 ACCOUNT NO.:  [id number]   MEDICAL RECORD NO.:  [id number]          PATIENT TYPE:  INP   LOCATION:  6529                         FACILITY:  [facility]   PHYSICIAN:  [name]. Riley Kill, MD, FACCDATE OF BIRTH:  [birth year]   DATE OF ADMISSION:  06/21/2007  DATE OF DISCHARGE:  06/23/2007                               DISCHARGE SUMMARY   ADDENDUM   Medication list should show that the patient is being discharged on  Benicar 40 mg 1 p.o. daily instead of Avapro 75 mg p.o. daily as listed  on initial discharge summary.  Please addend this to the dictation.      Bettey Mare. Lyman Bishop, NP      [name]. Riley Kill, MD, [facility]  Electronically Signed    KML/MEDQ  D:  06/23/2007  T:  06/23/2007  Job:  [id number]

## 2010-08-26 NOTE — Assessment & Plan Note (Signed)
[facility]                            CARDIOLOGY OFFICE NOTE   NASHA, DISS                        MRN:          [id number]  DATE:04/15/2007                            DOB:          1929-10-23    Ms. Lechtenberg is in for a follow-up visit.  In general, she is stable.  She  has not been having any further symptoms since I saw her last.  She  underwent a radionuclide imaging study.  The ejection fraction was 77%  and there were no definite perfusion abnormalities.  Our plan was to  treat her medically unless there was a significant circumflex territory  distribution, the vessel in which she has a known obstruction on the  previous catheterization.  She did have chest pain with the adenosine  infusion but not any since.  She is satisfied with being treated  medically at present.   CURRENT MEDICATIONS:  1. Neurontin 300 mg p.o. t.i.d.  2. Imdur 60 mg daily.  3. Coreg 12.5 mg b.i.d.  4. Norvasc 10 mg one-half tablet daily.  5. Protonix 40 mg daily.  6. Aspirin 81 mg daily.  7. Plavix 75 mg daily.  8. Stool softener daily.  9. Omega-3 daily.  10.Vitamin D 700 units daily.  11.Calcium 1000 mg daily.  12.Vitamin B3 1000 international units daily.  13.Actos 15 mg daily.   PHYSICAL EXAMINATION:  She is alert and oriented, in no distress.  Blood pressure 140/70, pulse 72.  Lung fields clear.  Cardiac rhythm is regular without a definite murmur noted.   IMPRESSION:  1. Known coronary artery disease with prior catheterization      demonstrating patent saphenous vein graft to the diagonal, patent      internal mammary to the left anterior descending artery, 60-70%      focal circumflex stenosis, and 30% posterior descending artery      stenosis January 19, 2006.  2. Diabetes mellitus, not well-controlled, with recent addition of      Actos.  3. History of hypomagnesemia.  4. Hypertension.   PLAN:  1. Return to clinic in two months.  2. Continued  follow-up with Dr. Lucianne Muss with regard to her diabetes      management, which has      been under poor control recently.  3. Continued follow-up with Patrica Duel, M.D.     [name]. Riley Kill, MD, [facility]  Electronically Signed    TDS/MedQ  DD: 04/15/2007  DT: 04/15/2007  Job #: [id number]

## 2010-08-26 NOTE — Letter (Signed)
September 21, 2006    Patrica Duel, M.D.  [address]  [place], [place] [address]   RE:  [name], [name]  MRN:  [id number]  /  DOB:  Apr 18, 1929   Dear [name],   I had the pleasure of seeing [name] in the office today in followup.  Since I saw her last she has only had one episode of chest discomfort,  and it seemed to be relieved rather readily with sublingual  nitroglycerin. She has not had any followup on this. I do know that her  diuretic portion of her antihypertensive was discontinued.   CURRENT MEDICATIONS:  Currently, she is on:  1. Lantus daily.  2. Benicar 20 mg daily.  3. Vitamin D.  4. She takes spironolactone on an occasional basis.  5. Klor-Con 10 mEq daily.  6. She is also on Plavix 75 mg daily.  7. Aspirin 81 mg daily.  8. Magnesium oxide 400 mg daily.  9. Protonix 40 mg daily.  10.Humalog.  11.Humulin N b.i.d.  12.Norvasc 10 mg 1/2 tablet daily.  13.Coreg 12.5 b.i.d.  14.Imdur 60 mg daily.  15.Neurontin 300 mg t.i.d.   PHYSICAL EXAMINATION:  GENERAL:  She is alert and oriented.  VITAL SIGNS:  Blood pressure is 130/70, the pulse is 80.  LUNGS:  The lung fields are quite clear.  CARDIAC:  Regular.   The patient's electrocardiogram today revealed normal sinus rhythm.  There is slight delay in R wave progression with a late transition zone.  There is minor nonspecific T wave flattening, tracing is otherwise  unremarkable.   Joeanna seems to be stable from a cardiac standpoint. I do have some  concerns about her medications. She is on Benicar, but also taking some  potassium supplement without any diuretic. I have also encouraged her to  somewhat refrain from the use of spironolactone as a p.r.n. diuretic. I  have asked her to discuss this with you when she sees you back in  followup in the near future, and also to discuss it potentially with  [name] who she is seeing shortly. Thanks for allowing me to share in  her care.    Sincerely,      [name]. Riley Kill, MD, [facility]  Electronically Signed    TDS/MedQ  DD: 09/21/2006  DT: 09/22/2006  Job #: [id number]   CC:    [name], M.D.

## 2010-08-26 NOTE — Assessment & Plan Note (Signed)
[facility]                            CARDIOLOGY OFFICE NOTE   LIZETH, BENCOSME                        MRN:          [id number]  DATE:03/22/2007                            DOB:          [birth year]    [name] is in for followup. In general, she has been relatively well. She  gets a little bit of chest discomfort once in a while but her main  complaint is that of progressive shortness of breath. Importantly, at  cath, she had patent grafts, but she does have a 60-70% stenosis in the  circumflex coronary artery. She says the symptoms have not gotten any  worse, but they have not gotten any better. Her last myocardial  perfusion scan was some time ago, and was read as a normal study, but  this was in 1998. She denies any resting chest discomfort.   She has had her labs done at Dr. Remus Blake office, who has told her she  needs to stay on magnesium.   CURRENT MEDICATIONS:  1. Neurontin daily.  2. Imdur 60 mg daily.  3. Coreg 12.5 b.i.d.  4. Norvasc 10 one-half daily.  5. Protonix p.r.n.  6. Mag oxide 500 mg b.i.d.  7. Aspirin 81 mg daily.  8. Plavix 75 mg daily.  9. Stool softener daily.  10.Benicar 40 mg daily.  11.Avandia 2 mg daily.  12.Insulin pump.  13.Vitamin D one daily.   PHYSICAL EXAMINATION:  She is alert, oriented. Blood pressure is 132/78,  pulse 80.  The lung fields are really quite clear.  Cardiac rhythm is regular   Capucine does have shortness of breath. The etiologies of this could be  multiple. She does have diabetes and she does have fairly limited  exercise tolerance due to shortness of breath. Based on this, we will  get a myocardial profusion study to ensure that her circumflex territory  is well perfused. In the absence of a defect, continued medical therapy  would be recommended. Otherwise, we will continue her on her current  treatment. If there were large perfusion defect in  the circumflex territory, then consider might  be given to treating it. I  am doubtful that this will be the case.     [name]. Riley Kill, MD, [facility]  Electronically Signed    TDS/MedQ  DD: 03/22/2007  DT: 03/22/2007  Job #: [id number]   cc:   Patrica Duel, M.D.  Reather Littler, M.D.

## 2010-08-26 NOTE — Assessment & Plan Note (Signed)
[facility]                            CARDIOLOGY OFFICE NOTE   [name], [name]                        MRN:          [id number]  DATE:07/06/2007                            DOB:          04/20/1929    HISTORY:  This is a 77 year old white female patient of [name]  who has significant coronary artery disease status post CABG with a LIMA  to the LAD and SVG to the diagonal.  She came in on June 21, 2007 with  recurrent chest pain and was found to have progression of disease in the  native circumflex.  She underwent stenting with a platinum study stent  without difficulty.  She had continued patency of the SVG to the  diagonal and LIMA to the LAD.  She had preserved LV function with mild  anterior apical wall motion abnormality.  She was discharged home in  stable condition.  Since she has been home, she has developed a  significant upper respiratory infection and has been seen by Dr.  Nobie Yu.  She was initially placed on a Z-Pak, but then did not improve  and received an antibiotic shot on Monday.  She is starting to feel  better.  The patient was readmitted to the hospital with recurrent chest  pain on June 28, 2007.  A 2-D echo was ordered.  A 2-D echo showed  normal LV function, EF 60-65%.  No significant wall motion  abnormalities.  LV wall thickness was mildly increased.  Features were  consistent with mild diastolic dysfunction.  The estimated peak  pulmonary artery systolic pressure was mildly increased.  I do not have  a discharge summary from that admission.  The patient overall is feeling  better as far as her heart is concerned.  She had no recurrent chest  pain.  Most of her symptoms are related to her upper respiratory  infection.   CURRENT MEDICATIONS:  1. Benicar 40 mg daily.  2. Plavix 75 mg daily.  3. Coreg 12.5 mg b.i.d.  4. Neurontin 300 mg t.i.d.  5. Norvasc 5 mg daily.  6. Isosorbide 60 mg daily.  7. Aspirin 325  mg daily.  8. Omega-369 daily.  9. Calcium 1000 mg daily.  10.Vitamin D 3000 units daily.  11.Magnesium 500 daily.  12.Pepcid 40 mg daily.   PHYSICAL EXAMINATION:  GENERAL:  This is a pleasant 77 year old white  female in no acute distress.  VITAL SIGNS:  Blood pressure 152/71, pulse 79, weight 116.  NECK:  Without JVD, HJR.  She does have bilateral carotid bruits, right  greater than left.  LUNGS:  Decreased breath sounds, but clear anterior  posterior and lateral.  HEART:  Regular rate and rhythm at 79 beats per minute, normal S1-S2,  positive S4, no significant murmur heard.  ABDOMEN:  Soft without organomegaly, masses, lesions or abnormal  tenderness.  Right groin without hematoma or hemorrhage.  LOWER EXTREMITIES:  Without cyanosis or edema.  She has good distal  pulses.   IMPRESSION:  1. Coronary artery disease status post prior coronary artery bypass  graft x2 with a left internal mammary artery to the left anterior      descending and saphenous vein graft to the diagonal.  2. Status post platinum study stent to the native circumflex on June 21, 2007.  3. Evidence of diastolic dysfunction on 2-dimensional echo.  4. Insulin-dependent diabetes mellitus with insulin pump.  5. Hypertension.  6. Gastroesophageal reflux disease.  7. History of erosive gastritis.  8. Gout.  9. Recent upper respiratory infection being treated.  10.Diverticulosis.   PLAN:  At this time, the patient is stable from a cardiac standpoint.  Her blood pressure is up a little today, but she took her medications  just before she came.  Dr. Riley Kill may want to increase her Coreg or  Norvasc on her next office visit.  She should also have carotid Dopplers  as well.      Amanda Reedy, PA-C  Electronically Signed      [name]. Amanda Chance, MD, [facility]  Electronically Signed   ML/MedQ  DD: 07/06/2007  DT: 07/06/2007  Job #: [id number]

## 2010-08-26 NOTE — Assessment & Plan Note (Signed)
[facility]                            CARDIOLOGY OFFICE NOTE   [name]                        MRN:          [id number]  DATE:06/21/2008                            DOB:          07/03/1929    [name] is in for a followup visit.  Last night, she had 3 or 4 episodes  of chest discomfort.  She took several nitroglycerins and gradually  relieved.  She denies any current symptoms.   MEDICATIONS:  1. Benicar 40 mg daily.  2. Plavix 75 mg daily.  3. Coreg 12.5 b.i.d.  4. Neurontin 300 mg t.i.d.  5. Norvasc 5 mg daily.  6. Isosorbide mononitrate 60 mg daily.  7. Aspirin 325 mg daily.  8. Omega.  9. Calcium.  10.Magnesium.  11.Vitamin D.  12.__________.   PHYSICAL EXAMINATION:  GENERAL:  She is alert and oriented, in no  distress.  VITAL SIGNS:  Currently, the weight is 162, blood pressure 140/68, pulse  76.  LUNGS:  Lung fields are clear.  There is a soft systolic ejection murmur  noted at the left sternal edge.   Mao's electrocardiogram demonstrates normal sinus rhythm.  There is  nonspecific ST-T wave abnormalities.  These are noted inferior and  laterally.  Of note, she has had these in previous electrocardiographic  tracings.   IMPRESSION:  1. Recurrent chest pain yesterday with relief with nitroglycerin.  2. History of prior revascularization surgery with patent internal      mammary, patent vein graft to the diagonal, and subsequent      percutaneous drug-eluting stent placement in the right coronary      artery.   PLAN:  I discussed the options with the patient, my preference would be  to have her come back in for repeat catheterization, but she would like  to defer this.  If she has any increase in symptoms, she will call us  promptly.  She is currently on an excellent medical regimen, and I am  not sure the etiology of her symptoms.  It would be a somewhat unusual  time for stent thrombosis, and there is always the  potential need for  TLR.  However, this is not frequent.  As she is in the platinum trial,  we will notify her folks.  1. She understands clearly if there is any increase in symptoms to      call us and go to the emergency room.  We discussed this at length      for approximately 30 minutes.     [name]. Riley Kill, MD, [facility]  Electronically Signed    TDS/MedQ  DD: 06/21/2008  DT: 06/22/2008  Job #: [id number]

## 2010-08-26 NOTE — H&P (Signed)
[name], [name]                 ACCOUNT NO.:  [id number]   MEDICAL RECORD NO.:  [id number]          PATIENT TYPE:  INP   LOCATION:  4706                         FACILITY:  [facility]   PHYSICIAN:  Marca Ancona, MD      DATE OF BIRTH:  May 19, 1929   DATE OF ADMISSION:  07/25/2008  DATE OF DISCHARGE:                              HISTORY & PHYSICAL   PRIMARY CARDIOLOGIST:  [name]. Riley Kill, MD, [facility]   PRIMARY CARE Benedicta Sultan:  Reather Littler, MD   PATIENT PROFILE:  A 77 year old Caucasian female with prior history of  CAD, status post CABG x2 as well as stenting of the left circumflex on  March 2009 who presents with recurrent angina.   PROBLEM LIST:  1. Unstable angina/CAD.      a.     Status post CABG x2 in 1999 with a vein graft to the       diagonal and LIMA to the LAD.      b.     On June 21, 2007 non-ST-elevation MI.  Catheterization at       the time revealed patent grafts with an EF of 55%.  There is an       90% stenosis on the left circumflex and this is successfully       stented with a 2.5 x 18-mm platinum study stent.  2. Insulin-dependant diabetes mellitus, currently using insulin pump.  3. Hypertension.  4. Hyperlipidemia.  5. History of erosive gastritis  6. GERD.  7. Gout.  8. History of colon polyps.  9. Diverticulosis.  10.Peripheral neuropathy.  11.Status post tonsillectomy.  12.Status post appendectomy.  13.Status post hysterectomy and BSO.  14.Status post bladder tach.  15.Recent right foot stress fracture.   HISTORY OF PRESENT ILLNESS:  A 77 year old Caucasian female with the  above problem list.  She recently saw Dr. [facility] on June 21, 2008 with complaints of 3-4 episodes of chest discomfort that is  occurred with rest on exertion.  She also has chronic dyspnea.  It was  recommended that the patient undergo cardiac catheterization; however,  she wished to defer.  Unfortunately, she continued to have intermittent  rest on exertion and  substernal chest discomfort as well as dyspnea and  over the last week has had daily episodes, the worse which occurred this  past Sunday.  Symptoms are always relieved with nitroglycerin and  although, this past Sunday it required 3 nitroglycerin sprays.  Due to  persistence of the symptoms, she called the office this morning.  Over  this afternoon, she was advised to come to the ED for evaluation.  She  is currently pain free.   ALLERGIES:  1. MORPHINE.  2. CODEINE.  3. STATINS.  4. TETANUS TOXOID.   HOME MEDICATIONS:  1. Aspirin 325 mg daily.  2. Benicar 40 mg daily.  3. Plavix 75 mg daily.  4. Coreg 12.5 mg b.i.d.  5. Neurontin 300 mg t.i.d.  6. Norvasc 5 mg daily.  7. Imdur 60 mg daily.  8. Omega-3 fatty acid daily.  9. Calcium daily.  10.Magnesium daily.  11.Vitamin D daily.  12.Insulin pump continuous.   FAMILY HISTORY:  Mother died at 59 of CVA.  Father died at 70 of lung  cancer.  She has a brother and she is unaware of his medical history.  She had a sister died at 25 of unknown causes.   SOCIAL HISTORY:  She lives in [place] with her significant other.  She worked for [employer] for 41 years.  She never smokes  cigarettes.  Denies alcohol or drugs.  She does not do routine exercise.   REVIEW OF SYSTEMS:  Positive for chest pain and chronic dyspnea on  exertion.  She has a history of diabetes and use of insulin pump.  Otherwise, all systems reviewed negative.  She is a full code.   PHYSICAL EXAMINATION:  VITAL SIGNS:  Temperature 98.0, heart rate 98,  respirations 18, blood pressure 144/82, pulse ox 94% on room air.  GENERAL:  Pleasant white female in no acute distress, awake, alert, and  oriented x3.  PSYCHIATRIC:  Normal affect.  HEENT:  Normal.  SKIN:  Warm and dry without lesions or masses.  NEUROLOGIC:  Grossly intact, nonfocal.  MUSCULOSKELETAL:  Notable for a soft calf over the right foot.  Otherwise, no deformity or effusions.   NECK:  Supple without bruits or JVD.  LUNGS:  Respirations are regular and unlabored.  Clear to auscultation.  CARDIAC:  Regular.  S1 and S2.  No S3, S4, or murmurs.  ABDOMEN:  Round, soft, nontender, and nondistended.  Bowel sounds  present x4.  EXTREMITIES:  Warm and dry and pink.  No clubbing, cyanosis, or edema.  Dorsalis pedis.  Posterior tibial pulses 2+ and equal bilaterally.  No  femoral bruises noted.   Chest x-ray is pending.  EKG shows sinus rhythm with T wave inversions  and approximately 0.5 mm ST depression, inferior and anterolateral  leads, which appears to be more profound than ECG in March 2009.  Lab  work is pending.   ASSESSMENT AND PLAN:  1. Unstable angina/coronary artery disease.  The patient presents with      progressive rest and exertional angina, relieved with      nitroglycerin.  Her ECG does show more pronounced inferolateral T      wave inversion, mild ST segment depression.  Plan to admit and      cycle cardiac markers.  Continue aspirin, Plavix, beta-blockers,      and ARB.  We will add nitroglycerine paste as well as heparin      infusion.  Plan for left heart catheterization in a.m.  Note, the      patient is not on statin secondary to previous intolerance with      myalgias.  2. Hypertension.  Blood pressure is currently elevated.  We will      continue her home medications and follow and adjust if necessary.  3. Hyperlipidemia.  The patient has previous intolerance to statins.      We will check LFTs.  Continue fish oil.  4. Diabetes mellitus.  The patient uses insulin pump and we will      continue to allow her to use this during the admission.      Nicolasa Ducking, ANP      Marca Ancona, MD  Electronically Signed    CB/MEDQ  D:  07/25/2008  T:  07/26/2008  Job:  [id number]

## 2010-08-29 NOTE — Op Note (Signed)
[place]  Patient:    ZAINAH, STEVEN Visit Number: [id number] MRN: [id number]          Service Type: DSU Location: [facility] 2861 01 Attending Physician:  Tommy Medal Dictated by:   Doris Cheadle. Dione Booze, M.D. Proc. Date: 10/25/01 Admit Date:  10/25/2001 Discharge Date: 10/25/2001   CC:         Patrica Duel, M.D.  Reather Littler, M.D.  Ernesto Rutherford, M.D.   Operative Report  INDICATIONS AND JUSTIFICATIONS FOR THE PROCEDURE:  Ryanne Morand is a 77 year old widow followed by Dr. Fawn Kirk and by Dr. Patrica Duel and by Dr. Reather Littler.  She is from [place] and was seen on October 13, 2001 regarding bilateral ptosis.  This had developed somewhat gradually over the last two years and the left eye was worse than the right.  She felt that the lid margin covered her vision and that she could see better when she pulled up the eyelids.  This did bother her visually and is not a cosmetic problem.  She is followed by Dr. Fawn Kirk regarding diabetic disease and she has had a detached retina.  Examination shows that her uncorrected vision is 20/30 in each eye and the pressure is 17 in the right and 15 in the left.  Visual field testing at Dr. Barbaraann Barthel office shows significant improvement when the eyelid is taped compared to when it is untaped on each side.  The pupils, motility, conjunctivae, cornea, anterior chamber and fundus exam is otherwise unremarkable except for the known retinal problems and she does have early, and significant cataracts.  Externally, she has ptosis with the right worse than the left.  She has excellent levator function of about 16 mm and the margin reflects distance of 0 on the right and 1 mm on the left.  Her fissure on the right measures 5 mm and on the left measures 6 mm.  Since she has excellent levator function, it is felt that a ____________ procedure should be adequate to help her visually.  She is to have this done in  the minor surgery room at [facility] and we will do the right eye first followed by the left eye.  Medically, she should be stable for this.  JUSTIFICATION FOR PERFORMING PROCEDURE IN OUTPATIENT SETTING:  Routine.  JUSTIFICATION FOR OVERNIGHT STAY:  None.  PREOPERATIVE DIAGNOSIS:  Ptosis on the right.  POSTOPERATIVE DIAGNOSIS:  Ptosis on the right.  OPERATION PERFORMED:  Ptosis repair, right upper eyelid.  SURGEON:  Robert L. Dione Booze, M.D.  ANESTHESIA:  1% Xylocaine with epinephrine.  DESCRIPTION OF PROCEDURE:  The patient arrived in the minor surgery room at [facility] and was prepped and draped in the routine fashion.  A frontal nerve block consisting of 1% Xylocaine with epinephrine was given.  The upper eyelid was everted and the tarsal margin was clamped with two curved hemostats.  A stab incision was made temporally and a running 6-0 chromic gut suture was behind the hemostats which were removed and the tissue which had been clamped was excised.  The suture was run back to the starting point and tied so that the knot was buried in the original stab incision.  Polysporin ointment was used and the eye was patched.  The patient left the minor room having done well. Dictated by:   Doris Cheadle. Dione Booze, M.D. Attending Physician:  Tommy Medal DD:  10/25/01 TD:  10/28/01  Job: [id number]

## 2010-08-29 NOTE — Cardiovascular Report (Signed)
[name], [name]                 ACCOUNT NO.:  [id number]   MEDICAL RECORD NO.:  [id number]          PATIENT TYPE:  INP   LOCATION:  2027                         FACILITY:  [facility]   PHYSICIAN:  [name]. Riley Kill, MD, FACCDATE OF BIRTH:  March 27, 1930   DATE OF PROCEDURE:  01/19/2006  DATE OF DISCHARGE:                              CARDIAC CATHETERIZATION   INDICATIONS:  [name] is a very delightful 77 year old who I have known for  some time.  She has undergone revascularization surgery with an internal  mammary to the LAD and a vein graft to the diagonal.  The current study is  done to assess coronary anatomy because of recurrent chest pain.   PROCEDURE:  1. Left heart catheterization.  2. Selective coronary arteriography.  3. Selective left ventriculography.  4. Saphenous vein graft angiography.  5. Selective left internal mammary angiography.   DESCRIPTION OF PROCEDURE:  The patient was brought to the cath lab and  prepped and draped in usual fashion.  Through an anterior puncture, the  right femoral artery was entered.  A 5-French sheath was placed.  Views of  left and right coronary arteries were obtained.  Vein graft angiography and  internal mammary angiography were performed.  The central aortic and left  ventricular pressures were measured with a pigtail.  Ventriculography was  performed in the RAO projection.  There were no complications. Five  milligrams of intravenous Lopressor was given to bring down blood pressure.  She was taken to the holding area in satisfactory clinical condition.   HEMODYNAMIC DATA:  1. Central aortic pressure 170/83, mean 119.  2. Left ventricular pressure 151/13.  3. No gradient pullback across the aortic valve.   ANGIOGRAPHIC DATA:  1. Ventriculography in the RAO projection reveals vigorous global systolic      function.  No segmental abnormalities or contraction are identified.      The EF is in excess of 55%.  2. The left main is free of  critical disease.  3. The LAD is totally occluded.  4. There is competitive filling in the distal LAD through the vein graft      to the diagonal which fills retrograde and then down the LAD.  The      internal mammary to the LAD is widely patent.  The vein graft to the      diagonal is also widely patent.  Both the diagonal and the LAD fill      nicely through their respective grafts.  The vein graft to the diagonal      has about 20% proximal narrowing seen in the LAO view.  5. The circumflex is a moderate sized vessel.  There was a previous 50%      stenosis.  That appears to be slightly worse with about a 60-70% area      of narrowing with mild hypodensity in the LAO views.  However, this      does not appear to exceed about 50%.  The remainder of the circumflex      is without critical disease.  6.  The right coronary artery is a large-caliber vessel providing proximal      30% stenosis in the PDA.  Posterolateral system is without significant      focal narrowing.   CONCLUSION:  1. Preserved left ventricular function.  2. Continued patency of the internal mammary to the LAD and vein graft to      the diagonal.  3. Some progression of the moderate stenosis to the circumflex coronary      artery as described above.   DISPOSITION:  I have reviewed the films carefully and compared them to the  previous studies.  There is some modest progression that the lesion would be  amendable to percutaneous stenting if, in fact, this was the etiology of her  symptoms and was necessitated for clinical reasons.  Importantly, it does  not appear to be critical.  The patient has had erosive gastritis back in  the summer and there has been some issue with regard to aspirin and dosing.  An initial attempt at medical therapy with institution of Plavix would be  worthwhile.  We will follow her closely. If she continues to have symptoms,  then intravascular ultrasound and/or fractional flow reserve  measurement  with possible percutaneous stenting would be considered.  We should have a  better idea about her tolerance of the antiplatelet agents at that time.  I  will see her back in the office and follow up closely.      [name]. Riley Kill, MD, [facility]  Electronically Signed     TDS/MEDQ  D:  01/20/2006  T:  01/21/2006  Job:  [id number]   cc:   Patrica Duel, M.D.  CV Lab

## 2010-08-29 NOTE — Assessment & Plan Note (Signed)
[facility]                              CARDIOLOGY OFFICE NOTE   KHRISTI, SCHILLER                        MRN:          [id number]  DATE:01/19/2006                            DOB:          [birth year]    Luwana is seen at the request of Dr. Patrica Duel. She presented to his  office yesterday with chest pain. In reviewing this with her, she says this  is the worst that she has ever had. It lasted and subsequently went away  with sublingual nitroglycerin. She still has a little bit of residual  discomfort today. Based on this, she had some borderline EKG changes  yesterday with mild subtle ST depression. It is hard to tell if this is  significant. Nonetheless, we are going to go ahead and admit her to the  hospital and get enzymes. I have tentatively set her up for diagnostic  cardiac catheterization in the morning. She would like to pursue this in  further detail. Her last catheterization was in June 2005, and she is an  insulin dependent diabetic. She has also had endoscopy in the interim, which  shows mild gastritis, but this was for a different symptom complex and this  was done in August of this year. She is agreeable with our plan.   ADDENDUM:  I have spoken with Dr. Patrica Duel and reviewed her case with  him as well.       [name]. Riley Kill, MD, [facility]     TDS/MedQ  DD:  01/19/2006  DT:  01/20/2006  Job #:  [id number]

## 2010-08-29 NOTE — Procedures (Signed)
NAMEAILEN, STRAUCH                           ACCOUNT NO.:  [id number]   MEDICAL RECORD NO.:  [id number]                   PATIENT TYPE:  OUT   LOCATION:  RAD                                  FACILITY:  [facility]   PHYSICIAN:  Edward L. Juanetta Gosling, M.D.             DATE OF BIRTH:  09/21/1929   DATE OF PROCEDURE:  DATE OF DISCHARGE:  08/23/2003                              PULMONARY FUNCTION TEST   RESULTS:  1. Spirometry shows a moderate ventilatory defect with air flow obstruction     in the area of the smaller airways.  2. Lung volumes show decrease in total lung capacity, suggestive of a     restrictive change which is of about the same order or magnitude as her     ventilatory defect.  3. DLCO is severely reduced.  4. Arterial blood gas shows mild relative resting hypoxemia and otherwise     normal.  5. There is no significant bronchodilator response.      ___________________________________________                                            Oneal Deputy. Juanetta Gosling, M.D.   ELH/MEDQ  D:  08/27/2003  T:  08/27/2003  Job:  [id number]   cc:   Dani Gobble, MD  Fax: [phone]

## 2010-08-29 NOTE — H&P (Signed)
[name], [name]                 ACCOUNT NO.:  [id number]   MEDICAL RECORD NO.:  [id number]           PATIENT TYPE:  AMB   LOCATION:  DAY                           FACILITY:  [facility]   PHYSICIAN:  Lionel December, M.D.    DATE OF BIRTH:  01-18-1930   DATE OF ADMISSION:  DATE OF DISCHARGE:  LH                                HISTORY & PHYSICAL   PRESENTING COMPLAINT:  Intractable nausea, abdominal pain as well as  infrascapular pain.   HISTORY OF PRESENT ILLNESS:  [name] is a 77 year old Caucasian female patient  of Patrica Duel, M.D. who is here for GI evaluation.  She has been seen by  me intermittently most recently in 2004.  She has had problems with nausea  off and on.  Workup in the past has been negative including upper abdominal  ultrasound.  She also had solid phase gastric emptying study in December  1998 which was normal with T1 half 47 minutes.   She states this time her nausea began 5 weeks ago.  She is surviving on  liquids.  Nausea gets worse with the meals but she has not had any vomiting.  She complains of bitter taste, upper and mid abdominal pain which is not  necessarily associated with her meals.  She also complains of bilateral  infrascapular pain worse on the right than on the left.  She states she has  lost 5-7 pounds.  Her glucose levels run above 200.  Dose of insulin has  been increased by endocrinologist, Dr. Lucianne Muss.  He recommended increasing the  dose further on a recent visit but she has been reluctant to do so.  She was  seen by Dr. Riley Kill last week and given samples of Protonix but she opted  not to take the medicine.  She denies fever, chills or night sweats.  She  denies melena or rectal bleeding.  Her bowels move daily.  She complains of  numbness in her lower extremities.  She has history of neuropathy.   She had lab studies by Dr. Nobie Putnam on 09/28/05.  Her LFTs and chemistry  panel were normal except glucose level of 313 and TSH was 1.012.  She  also  had upper abdominal ultrasound same date which was normal.  She denies  heartburn, dysphagia, hoarseness or chronic cough.  She complains of fatigue  which is more or less a chronic symptom.  She does not do any walking or  exercising regular basis.   MEDICATIONS:  1.  Neurontin 300 mg t.i.d.  2.  Glucophage 1 gram b.i.d.  3.  Humulin N 80 units in a.m. and 80 units in p.m.  4.  Humalog 14 units in a.m. and 14 in p.m.  5.  Coated ASA 325 mg daily.  6.  Norvasc 10 mg daily.  7.  Coreg 12.5 mg b.i.d.  8.  Aricept 10 mg q.h.s.  9.  Isosorbide 30 mg daily.  10. Omega-3 four capsules a day but she is able to take no more than one.  11. Spirolactone 50 mg daily.  12. Macrobid  100 mg b.i.d. which she will be finishing in 2 days (UTI).  13. Allopurinol 300 mg daily.  14. Phenergan 12.5 mg t.i.d. p.r.n.   PAST MEDICAL HISTORY:  Peripheral neuropathy was diagnosed over 20 years  ago, few years prior to her diagnosis of diabetes mellitus.  She has had  resistance to insulin and dose has been gradually increased.  She states  never been optimally controlled.  Hypertension of several years duration.  She has coronary artery disease.  She had CABG in February 1999.  Last  cardiac cath was in April 2001 and graft was patent.  She had laser surgery  for left retinal detachment 5 years ago and the vision is fine.  She has  gout and history of colonic polyps.  She had a small adenoma removed from  her ascending colon in June 1998.   Other surgeries include tonsillectomy, appendectomy, BSO with hysterectomy,  tacking of bladder.   ALLERGIES:  No known drug allergies.   FAMILY HISTORY:  Mother died of CVA at age 32.  Father died of lung  carcinoma at age 49.  She lost sister 3 months ago at 64, she is not sure  what happened to her. She apparently had some abdominal surgery.   SOCIAL HISTORY:  She is widowed and she has son who lives in [place].  She worked at [employer] for 41 years but she  has been retired for several years.  She has never smoked cigarettes or drank alcohol.   PHYSICAL EXAM:  GENERAL:  Pleasant well-developed, well-nourished Caucasian  female who is in no acute distress.  VITAL SIGNS:  She weighs 168-1/2 pounds.  She is 5 feet 4 inches tall.  Pulse 78 per minute, blood pressure 142/70, temperature is 98.4.  Conjunctivae is pink.  Sclerae is anicteric.  Oral pharyngeal mucosa is  normal.  Dentition in satisfactory condition.  No neck masses or thyromegaly  noted.  Carotids are 2+ bilaterally without bruits.  HEART:  Regular rhythm.  Normal S1-S2.  No murmur or gallop noted.  LUNGS:  Clear to auscultation.  ABDOMEN:  Protuberant.  Bowel sounds are normal.  No bruits noted.  On  palpation is soft with vague tenderness at midepigastrium, left lobe of  liver is palpable but liver does not appear to be enlarged. Spleen not  palpable.  RECTAL:  Reveals guaiac negative stool.  She does not have peripheral edema  or clubbing.   LABS:  From September 28, 2005; bilirubin 0.5, AP 118, AST 17, ALT 19, albumin  4.4, calcium 9.8.  TSH 1.012.  Sodium 138, potassium 4.0, chloride 98, CO2  21, glucose 313, BUN 17, creatinine 0.81.   ASSESSMENT:  [name] is 77 year old Caucasian female with longstanding  diabetes mellitus which has been suboptimally controlled who is on multiple  medicines who presents with intractable nausea of 5 weeks' duration with  diminished oral intake.  She had a negative upper abdominal ultrasound and  normal transaminases.  Some of her symptoms are suggestive of gallbladder  disease, although ultrasounds have been negative like the ones before.  She  could also have diabetic gastroparesis. Her emptying study 9 years ago was  normal.  She could also have peptic ulcer disease or even intestinal  ischemia.   RECOMMENDATIONS:  The patient advised to start Protonix 40 mg p.o. q.a.m. She will hold off her Omega-3 for now.  Hepatobiliary scan with CCK to  be  performed later this week.  If the study is normal she  will undergo  esophagogastroduodenoscopy followed by abdominopelvic CT and solid phase  gastric emptying study.  If Hiatus is unequivocally abnormal she will be  referred for cholecystectomy.      Lionel December, M.D.  Electronically Signed     NR/MEDQ  D:  10/08/2005  T:  10/09/2005  Job:  [id number]   cc:   Patrica Duel, M.D.  Fax: [phone]   [name]. Riley Kill, M.D. [facility]  [address]  [address]  [place]  [place] [address]

## 2010-08-29 NOTE — Op Note (Signed)
[name], [name]                           ACCOUNT NO.:  [id number]   MEDICAL RECORD NO.:  [id number]                   PATIENT TYPE:  OIB   LOCATION:  2872                                 FACILITY:  [facility]   PHYSICIAN:  Robert L. Dione Booze, M.D.               DATE OF BIRTH:  12/06/1929   DATE OF PROCEDURE:  11/10/2001  DATE OF DISCHARGE:  11/10/2001                                 OPERATIVE REPORT   INDICATIONS FOR PROCEDURE:  [name] was seen on October 13, 2001 with  bilateral significant ptosis.  Vision was 20/30 in each eye and pressure was  17 on the right and 15 on the left.  The pupils, motility, conjunctiva and  cornea, lens, anterior chamber examinations were negative.  In the margin,  reflex distance, that is, MRB, for the right eye was 0 mm and for the left  eye was 1 mm.  She had the right ptosis repaired on July 15 and comes back  now to have the left ptosis repaired.  She was having it done for visual  reasons since the lid blocked her upper field of vision and actually covered  the pupil at times when she is tired.  Medically, she should be stable for  this.   JUSTIFICATION FOR PERFORMING THE PROCEDURE IN AN OUTPATIENT SETTING:  Routine.   JUSTIFICATION FOR OVERNIGHT STAY:  None.   PREOPERATIVE DIAGNOSES:  Severe ptosis of the left upper eyelid.   POSTOPERATIVE DIAGNOSES:  Severe ptosis of the left upper eyelid.   SURGEON:  Robert L. Dione Booze, M.D.   ANESTHESIA:  1% Xylocaine with epinephrine.   OPERATION PERFORMED:  Fasanella-Servat ptosis operation of the left upper  eyelid.   DESCRIPTION OF PROCEDURE:  The patient arrived in the minor surgery room at  [facility] and the left side of her face was prepped and draped in  the routine fashion.  A frontal nerve block consisting of 1% Xylocaine with  epinephrine was given and was supplemented by additional block in the lid  itself.  The upper eyelid was everted and the tarsal margin was clamped with  a  curved hemostat so that about 2 mm of the margin were clamped.  A stab  incision was made temporally and a running 6-0 chromic gut suture was run  nasally.  The hemostats were removed and the tissue that had been clamped  was excised.  The suture was run back to the starting point and tied so that  the knot was buried.  Polysporin ointment was used and the eye was patched.  The patient left the room having done well.   FOLLOW UP:  The patient will be seen in my office in two weeks to have the  eye examined.  She is to use Polysporin ointment several times daily if the  eye is scratching.  She is to remove the patch the  next morning.                                                  Robert L. Dione Booze, M.D.   RLG/MEDQ  D:  11/19/2001  T:  11/22/2001  Job:  [id number]

## 2010-08-29 NOTE — Discharge Summary (Signed)
[name], [name]                 ACCOUNT NO.:  [id number]   MEDICAL RECORD NO.:  [id number]          PATIENT TYPE:  INP   LOCATION:  2027                         FACILITY:  [facility]   PHYSICIAN:  [name]. Amanda Kill, [name], FACCDATE OF BIRTH:  Feb 18, 1930   DATE OF ADMISSION:  01/19/2006  DATE OF DISCHARGE:  01/21/2006                                 DISCHARGE SUMMARY   PRIMARY CARDIOLOGIST:  Dr. Riley Yu.   PRIMARY CARE PHYSICIAN:  Dr. Patrica Yu.   PRINCIPAL DIAGNOSIS:  Unstable angina.   SECONDARY DIAGNOSES:  1. Coronary artery disease, status post coronary artery bypass graft.  2. Type 2 diabetes mellitus.  3. Hypertension.  4. Gastroesophageal reflux disease.  5. Erosive gastritis.  6. Gout.  7. History of colonic polyps.  8. Diverticulosis.  9. History of small adenoma removed from her ascending colon in 1998.  10.History of tonsillectomy.  11.Status post appendectomy.  12.Status post bilateral salpingo-oophorectomy with hysterectomy and      bladder tact.  13.Peripheral neuropathy x20 years.   ALLERGIES:  NO KNOWN DRUG ALLERGIES.   PROCEDURES:  Left heart catheterization.   HISTORY OF PRESENT ILLNESS:  [age]-year-old white female with a prior  history of CAD and CABG as outlined above, who is followed by Dr. Patrica Yu in [place].  She was seen by Dr. Shawnie Yu in our office on  January 19, 2006 with complaints of chest pain with borderline ST-segment  depression noted on the ECG.  The decision was made to admit her to [facility] and plan on left heart catheterization to reevaluate coronary anatomy.   HOSPITAL COURSE:  [name] ruled out for MI by cardiac markers and  underwent left heart catheterization on October 10, revealing a patent LIMA  to the LAD, patent vein graft to the first diagonal and a 60% to 70%  stenosis in the mid left circumflex.  The coronary artery had a 30% distal  stenosis.  She had normal LV function.  It was felt that the  stenosis in the  circumflex had progressed since last catheterization; however, it was still  felt to be nonobstructive and likely not causing her symptoms.  Decision was  made to continue medical therapy and her Imdur dose was increased to 60 mg  daily.  She was also placed on Plavix.  She has been ambulating this morning  without any recurrent chest discomfort and is being discharged home today is  satisfactory condition.   DISCHARGE LABS:  Hemoglobin 12.7, hematocrit 37.8, WBC 9.1, platelets 238.  Sodium 141, potassium 3.8, chloride 107, CO2 27, BUN 15, creatinine 0.7,  glucose 142.  Total bilirubin 1.1, alkaline phosphatase 106, AST 104, ALT  32, total protein 6.5, albumin 3.7, calium 9.6.  CK 89, MB 3.2, troponin I  0.02.   DISPOSITION:  The patient is being discharged home today in good condition.   FOLLOWUP PLANS AND APPOINTMENTS:  She is asked to follow up with primary  care physician, Dr. Patrica Yu, in the next 3 to 4 weeks.  She is to  follow up with Dr.  Shawnie Yu on October 18 at 2:30 p.m.   DISCHARGE MEDICATIONS:  1. Plavix 75 mg daily.  2. Imdur 60 mg daily.  3. Neurontin 300 mg t.i.d.  4. Glucophage 1000 mg b.i.d. to be resumed January 22, 2006.  5. Coreg 12.5 mg b.i.d.  6. Allopurinol 300 mg daily.  7. Norvasc 5 mg daily.  8. Spironolactone 50 mg daily.  9. Humulin N 80 units b.i.d.  10.Humalog 14 units b.i.d.  11.Aspirin 81 mg daily.  12.Protonix 40 mg daily.  13.Magnesium oxide 400 mg daily.  14.Nitroglycerin 0.4 mg sublingual p.r.n. chest pain.   OUTSTANDING LAB STUDIES:  None.   DURATION DISCHARGE ENCOUNTER:  Forty minutes, including physician time.     ______________________________  [name], [name]      [name]. Amanda Kill, [name], [facility]  Electronically Signed    CB/MEDQ  D:  01/21/2006  T:  01/21/2006  Job:  [id number]   cc:   [name], M.D.

## 2010-08-29 NOTE — H&P (Signed)
[name], [name]                 ACCOUNT NO.:  [id number]   MEDICAL RECORD NO.:  [id number]          PATIENT TYPE:  INP   LOCATION:  2027                         FACILITY:  [facility]   PHYSICIAN:  [name]. Riley Kill, MD, FACCDATE OF BIRTH:  1929/07/29   DATE OF ADMISSION:  01/19/2006  DATE OF DISCHARGE:                                HISTORY & PHYSICAL   CHIEF COMPLAINT:  Chest pain.   HISTORY OF PRESENT ILLNESS:  [name] awoke yesterday with a severe episode of  substernal chest pain.  This was associated with some shortness of breath  and mild radiation to the left arm.  She had never had pain this severe  before.  The EMS came out to her house, but she refused to go to the  Emergency Room and instead showed up at [facility].  She was  subsequently seen by Dr. Patrica Duel, and there were subtle ST segment  changes.  The patient refused again to be seen until she could be seen by me  today.  She presents to the office today.  The severe pain has essentially  resolved, but she has had some recurrent chest discomfort this morning.  There are no definite electrocardiographic abnormalities, but I have  discussed her case with Dr. Nobie Putnam, and I plan to admit her to the  hospital now for further evaluation and treatment.  The patient last  underwent diagnostic catheterization in 2005 by Dr. Lenise Herald.  At that  time, she had right and left heart catheterization.  The right heart  pressures included pulmonary artery pressures of 41 over 16 with a wedge of  17.  The systemic arterial pressures were elevated.  Cardiac index was  preserved.  Ejection fraction was 70%.  The right coronary artery was a  large vessel dominant and gave rise to a PDA as well as posterolateral  branch, and there was mild irregularity but no critical stenoses.  There was  a first OM from the circumflex system that had about a 50% mid lesion.  The  remainder of the findings showed a medium sized  left main without  significant disease and LAD which was totally occluded and the mid distal  LAD filling by the mammary which was patent and without significant  abnormalities.  The first diagonal fills by a separate vein graft inserts  into the midportion of the diagonal without significant disease.   In addition, [name] has undergone endoscopic evaluation recently.  Endoscopy  was done by Dr. Lionel December up at [facility].  This included colonoscopy  as well.  She had mild changes of reflux limited to the gastroesophageal  junction and an erosive gastritis.  She had multiple diverticula noted.   She has no known drug allergies.  Past medical history is remarkable for  peripheral neuropathy of 20 years duration.  She has insulin dependent  diabetes mellitus.  She has hypertension.  She has had known coronary  disease with bypass in February 1999.  She has a history of gout, colonic  polyps, and now diverticula.  She had a  small adenoma removed from her  ascending colon in 1998.  She has also had tonsillectomy and appendectomy,  bilateral BSO with hysterectomy and bladder tack.   MEDICATIONS:  1. Neurontin 300 mg t.i.d.  2. Glucophage 1000 mg b.i.d.  3. Imdur 30 mg daily.  4. Coreg 12.5 b.i.d.  5. Allopurinol 300 mg daily.  6. Norvasc 10 mg one-half tablet daily.  7. Spironolactone 50 mg daily.  8. Humulin N 80 units in the morning and combined with Humalog 14 units      and then 80 units in the evening before supper with 14 units of      Humalog.  9. She takes aspirin 325 mg three times weekly with fish oil 2 tablets      daily.  10.She also takes Protonix 40 mg daily.  11.Dr. Lucianne Muss has placed her on mag oxide 400 mg a day.   Her mother died of CVA at age 69.  Her father died of lung cancer at 46.  Her sister died about 5 months ago.   The patient is widowed, and she has a significant other.  She has a son who  lives in [place].  She has never smoked.  She worked at [employer]  for 41 years.   REVIEW OF SYSTEMS:  Remarkable for no GI bleeding.  There is no cough or  sputum production.  There is no HEENT problems at present.  It is otherwise  negative.   PHYSICAL EXAMINATION:  GENERAL:  She is alert and oriented female in no  acute distress.  VITAL SIGNS:  Blood pressure is 168/92.  Pulse is 67.  NECK:  The jugular veins are not distended.  The neck is supple.  There are  no carotid bruits.  HEENT:  Unremarkable.  LUNGS:  Clear to auscultation and percussion.  CARDIOVASCULAR:  The PMI is nondisplaced.  There is a normal first heart  sound and a prominent P2.  There is a 1/6 systolic ejection murmur with no  diastolic murmurs.  ABDOMEN:  Soft without hepatosplenomegaly noted.  Pulses are intact.   IMPRESSION:  1. Recurrent substernal chest pain.  2. History of coronary bypass graft surgery in 1999 with an internal      mammary to the left anterior descending and saphenous vein graft to the      diagonal.  3. Fifty percent obtuse marginal in 2005.  4. Recurrent chest pain with mild electrocardiographic abnormalities now      improved.  5. Insulin dependent diabetes mellitus.  6. History of reflux esophagitis.  7. Multiple prior surgeries as noted.  8. History of hypertension.   PLAN:  1. We will admit the patient to the hospital.  2. Enzymes will be obtained on a serial basis.  3. Cardiac catheterization will be performed, and I have discussed her      case with Dr. Patrica Duel.      [name]. Riley Kill, MD, [facility]  Electronically Signed     TDS/MEDQ  D:  01/19/2006  T:  01/20/2006  Job:  [id number]

## 2010-08-29 NOTE — Discharge Summary (Signed)
NAMECARINNE, BRANDENBURGER                 ACCOUNT NO.:  [id number]   MEDICAL RECORD NO.:  [id number]          PATIENT TYPE:  INP   LOCATION:  2024                         FACILITY:  [facility]   PHYSICIAN:  [name]. Riley Kill, MD, FACCDATE OF BIRTH:  1929/06/12   DATE OF ADMISSION:  06/28/2007  DATE OF DISCHARGE:  06/30/2007                               DISCHARGE SUMMARY   PRIMARY CARDIOLOGIST:  Maisie Fus D. Riley Kill, MD, [facility].   PRIMARY CARE PHYSICIAN:  Reather Littler, M.D.   DISCHARGING DIAGNOSIS:  Chest pain with a negative cardiac workup this  admission.  No plans for cardiac catheterization at this time.  Etiology  of chest discomfort unclear at the time of discharge.  PA and Lateral  chest x-ray show cardiomegaly status post coronary artery bypass graft.  Chronic lung changes without acute abnormality.   PAST MEDICAL HISTORY:  1. Coronary artery disease.  2. GERD.  3. Peripheral neuropathy.  4. Insulin-dependent diabetes, currently being treated with insulin      pump per Dr. Lucianne Muss.  5. Gout.  6. Colon polyps.  7. Diverticulitis.  8. Status post tonsillectomy, status post appendectomy, status post      oophorectomy with hysterectomy, and bladder tacking procedure.   PROCEDURES AT THIS ADMISSION:  1. A 2-D echocardiogram showed an ejection fraction of 60-65%, mild      diastolic dysfunction, estimated peak pulmonary artery systolic      pressure mildly increased.  2. PA and lateral chest x-ray.   HISTORY OF PRESENT ILLNESS:  Ms. Eifler is a 77 year old Caucasian female  followed by Dr. Shawnie Pons, who presented initially to [facility] complaining of chest discomfort.  Ms. Depasquale has a history of  high-grade progressive lesion in the circumflex coronary artery that was  randomized in the platinum study.  Diagonal vein graft and internal  mammary to the LAD appeared to be patent and right coronary artery was  noncritical.  Ms. Nygard had previous stenting of the cyst without  significant complications.  Because of Ms. Temkin history, she was  transferred to [facility] for further evaluation.  Initially, Dr.  Riley Kill considered cardiac catheterization for reevaluation.  The  patient opted for observation.  The patient was treated with IV  nitroglycerin and heparin.  The patient ruled out for myocardial  infarction by enzymes and EKG.  A 2-D echocardiogram obtained, results  as stated above.  The patient decided to get cardiac catheterization,  IV nitroglycerin and heparin were discontinued.  The patient was started  on Pepcid.  PA and lateral chest x-ray were obtained, results as stated  above.   The patient's symptoms improved and she was discharged home to follow up  with Dr. Riley Kill in the office.  The patient's only complaint at the  time of discharge was a nagging cough.   DISCHARGE MEDICATIONS:  1. Plavix 75 mg daily.  2. Aspirin 325 daily.  3. Neurontin 300 t.i.d.  4. Amlodipine 5 mg daily.  5. Pepcid 40 mg daily.  6. Isosorbide mononitrate 60 mg daily.  7. Carvedilol 12.5 mg b.i.d.  8.  Benicar 40 mg daily.  9. Insulin pump as previously prescribed by Dr. Lucianne Muss.  10.Omega fish oil and vitamin D.  11.Calcium and magnesium as previously recommended by Dr. Lucianne Muss.  12.Nitroglycerin as needed.   The patient's followup appointment with Dr. Riley Kill on March 25, at  8:45.   Discharge encounter 30 minutes.      Dorian Pod, ACNP      [name]. Riley Kill, MD, [facility]  Electronically Signed    MB/MEDQ  D:  07/21/2007  T:  07/22/2007  Job:  [id number]   cc:   Reather Littler, M.D.

## 2010-08-29 NOTE — Assessment & Plan Note (Signed)
[facility]                            CARDIOLOGY OFFICE NOTE   [name], [name]                        MRN:          [id number]  DATE:03/17/2006                            DOB:          [birth year]    [name] is in for a followup visit.  She is doing reasonably well, but she  had an episode of chest pain last week that awoke her from her sleep at  3 in the morning.  She took 2 nitroglycerin, and it was ultimately  relieved.  We have been watching her circumflex, which appeared to show  some progression on the left catheterization, and she is tolerating  aspirin and Plavix, although she is complaining somewhat about the  bruising in her hands.  She also stated today that she would to hold off  at the present time with regard to a consideration of stenting in the  absence of further episodes of pain.  She was also seen by Patrica Duel  yesterday, and she was significantly hypertensive, so he added Cozaar to  her regimen.  She was already on Spironolactone, and Mark and I  discussed this today in detail.  As a result, we are going to switch her  to Hyzaar 50/12.5, and we will discontinue both Cozaar and  Spironolactone.  We have carefully explained this to [name], and she  clearly understands it by giving her explicit instructions.  She is  going to try to eat a banana or drink some orange juice every day, and  she is to have a followup basic metabolic profile with [name] next week.   PHYSICAL EXAMINATION:  VITAL SIGNS:  On her exam today, the blood  pressure is 141/70, pulse is 83.  LUNGS:  The lung fields are clear.  CARDIAC:  Unchanged.   ELECTROCARDIOGRAM:  The electrocardiogram demonstrates normal sinus  rhythm, nonspecific T wave abnormality.   IMPRESSION:  Coronary artery disease with prior coronary  revascularization surgery, patent internal mammary to the left anterior  descending and vein graft to the diagonal.  Some progression of stenosis  in the native circumflex with well-preserved left ventricular function.   PLAN:  The patient will continue to followup with Patrica Duel next  week in the office.  They will recheck her potassium.  She understands  the potential for hypokalemia.  She understands the reason for  discontinuation of Spironolactone.     [name]. Riley Kill, MD, [facility]  Electronically Signed    TDS/MedQ  DD: 03/17/2006  DT: 03/18/2006  Job #: [id number]

## 2010-08-29 NOTE — Assessment & Plan Note (Signed)
[facility]                              CARDIOLOGY OFFICE NOTE   [name], [name]                        MRN:          [id number]  DATE:01/28/2006                            DOB:          08-20-1929    [name] is back in for followup.  She is stable, she underwent diagnostic  catheterization.  The major finding at the catheterization time was that  there was competitive filling in the distal LAD from the vein graft to the  diagonal.  Both filled nicely through their respective grafts, the left was  free of critical disease, and the right was free of critical disease.  The  circ was a moderate-size vessel with some 60% to 70% narrowing in its mid  portion, which appeared to be some progression from the previous study, but  did not appear to be critical, in light of her erosive gastritis and  potential need for treatment for H. pylori it was felt that a conservative  course of management might be initiated, and so we started on low-dose  Plavix.  She remains on 81 mg of aspirin.  She is trying to get up and walk  on a regular basis.   Her weight is 166 pounds, the blood pressure 128/70, the pulse is 66.  The lung fields are clear.  The cardiac rhythm is regular.  The groin is healed with a minimal ecchymosis.   The electrocardiogram demonstrates nonspecific T-wave abnormality.   We plan to see her back in followup in 6 weeks.  I plan to continue to  follow her closely.  Should there be a change in her status we might  consider doing a percutaneous coronary intervention on her circumflex, but  again it did not appear to be that critical, and in light of all the  gastrointestinal issues that are ongoing, I would like to avoid putting in a  drug-eluting platform which would be most appropriate in the setting of  diabetes.  I also doubt that this was the cause of her symptoms.     [name]. Riley Kill, MD, [facility]    TDS/MedQ  DD: 01/28/2006  DT:  01/30/2006  Job #: [id number]

## 2010-08-29 NOTE — Assessment & Plan Note (Signed)
[facility]                              CARDIOLOGY OFFICE NOTE   MABRY, SANTARELLI                        MRN:          [id number]  DATE:10/29/2005                            DOB:          June 22, 1929    [name] is in for a follow up.  She has rare to occasional episodes of chest  discomfort.  It is not frequent.  She recently noted that her five week  ordeal with nausea had dramatically improved.  She saw Dr. Karilyn Cota recently  and apparently a nuclear scan was done and he called her and told her that  things looked good.  Apparently, she is going to have an upper and lower  endoscopy.  She saw Dr. Lucianne Muss earlier today.  She has had a low serum  magnesium on a couple of occasions.  She is on multiple medications.  Her  labs have been done there.   MEDICATIONS:  1.  Neurontin 300 mg t.i.d.  2.  Glucophage 1,000 mg b.i.d.  3.  Imdur 30 mg daily.  4.  Coreg 12.5 b.i.d.  5.  Allopurinol 300 mg daily.  6.  Norvasc 10 mg 1/2 tablet daily.  7.  Spironolactone 50 mg 1/2 tablet daily.  8.  Humulin 80 units b.i.d.  9.  Humalog 14 units b.i.d.  10. Enteric coated aspirin 325 mg 3 times weekly.  11. Fish oil daily.   PHYSICAL EXAMINATION:  GENERAL:  Gaylyn appears well.  VITAL SIGNS:  Weight 164 pounds, blood pressure 144/64, pulse 60.  LUNGS:  Fields are really quite clear.  There is perhaps slight prominence  of the P2 sound.   IMPRESSION:  1.  Coronary artery disease status post coronary artery bypass graft      surgery.  2.  Recent CT angiography by Dr. Andee Lineman.  3.  Insulin-dependent diabetes mellitus.  4.  History of recent abdominal discomfort being evaluated by Dr. Karilyn Cota.   PLAN:  1.  Continue medical therapy.  2.  Follow up in Cardiology Clinic in 3-4 months.  3.  Magnesium replacement is suggested by Dr. Lucianne Muss.                              [name]. Amanda Kill, MD, [facility]   TDS/MedQ  DD:  10/29/2005  DT:  10/30/2005  Job #:  [id number]

## 2010-08-29 NOTE — Cardiovascular Report (Signed)
NAMERODERICA, CATHELL                           ACCOUNT NO.:  [id number]   MEDICAL RECORD NO.:  [id number]                   PATIENT TYPE:  OIB   LOCATION:  2860                                 FACILITY:  [facility]   PHYSICIAN:  Darlin Priestly, M.D.             DATE OF BIRTH:  1929/12/15   DATE OF PROCEDURE:  10/08/2003  DATE OF DISCHARGE:                              CARDIAC CATHETERIZATION   PROCEDURES:  1. Right heart catheterization.  2. Left heart catheterization.  3. Coronary angiography.  4. Left ventriculogram.  5. Abdominal aortogram.  6. Saphenous vein graft.  7. Left internal mammary artery angiography.   CARDIOLOGIST:  Darlin Priestly, M.D.   COMPLICATIONS:  None.   INDICATIONS:  [name] is a 77 year old female patient of Dr. Patrica Duel  and Dr. Domingo Sep with a history of hypertension, insulin-dependent diabetes  mellitus, history of hypertension, history of CAD status post coronary  artery bypass graft surgery by Dr. Evelene Croon consisting of a LIMA to the  LAD, vein graft to the diagonal  The patient has continued to complain of  increasing shortness of breath with negative Cardiolite scans.  However, she  did have a negative Cardiolite prior to having significant CAD by  catheterization.  She has also been noted to have moderate pulmonary  hypertension.  She is now referred for left and right heart catheterization  to assess her coronary anatomy, LV and right heart pressures.   DESCRIPTION OF PROCEDURE:  After obtaining informed written consent, the  patient was brought to the cardiac catheterization lab where her right and  left groins were shaved, prepped, and draped in the usual sterile fashion.  ECG monitoring was established.  Using a modified Seldinger technique, a 8  French venous sheath was inserted into the right femoral vein.  A #6 French  arterial sheath was inserted into the right femoral artery.   Next, under fluoroscopic guidance, a 7  French Swan-Ganz catheter was floated  into the RA, RV, PA and wedge position.  The hemodynamic measurements were  obtained.  A 6 French diagnostic was then used to perform diagnostic  angiography.  This reveals a medium size left main with no significant  disease.  The LAD is a medium size vessel which is totally occluded in its  proximal portion.  The mid and distal LAD fill the patent LIMA which inserts  in the midportion of the LAD.  There is no significant disease in the IMA or  distal IMA insertion.  There are two diagonal branches, one which fills the  LIMA to the LAD which is the second diagonal.  The first diagonal fills via  a separate vein graft and inserts in the midportion of the diagonal.  There  is no significant disease in the body of the graft or distal to the graft at  the insertion of the diagonal.   The left circumflex is  a medium size vessel that gave rise to an obtuse  marginal branch.  There is no significant disease in the AV circumflex.  The  first OM is a medium size vessel with a 50% mid vessel lesion.   The right coronary artery is large and dominant and gives rise to a PDA as  well as a posterior lateral branch.  The RCA is mildly irregular but has no  high grade stenosis.  The PDA and posterior lateral branch are medium size  vessels with no significant disease.   The left ventriculogram reveals ejection fraction of 70%.   ABDOMINAL AORTOGRAM:  The abdominal arteriogram reveals no evidence of renal  artery stenosis.   HEMODYNAMICS:  Right atrial pressure 5, RV 41/5, PA 41/16.  Pulmonary  capillary wedge pressure was 17.  Systemic arterial pressure 173/74, LV  systolic pressure 178/7.  LVEDP 16.  Cardiac output 6.8.  Cardiac index 3.8,  PA saturation 66%, aortic saturation 97%.   CONCLUSION:  1. Significant one vessel coronary artery disease.  2. Patent left internal mammary artery to the left anterior descending with     no significant disease in the  IMA or distal IMA insertion.  3. Patent saphenous vein graft to the diagonal with no significant disease     in the graft or distal to the graft insertion.  4. Normal left ventricular systolic function.  5. No evidence of renal artery stenosis.  6. Elevated pulmonary capillary wedge pressure with mild pulmonary     hypertension.  7. Systemic hypertension.  8. Cardiac output 6.8, cardiac index 3.8.  9. PA saturation of 66%, aortic saturation 97%.                                               Darlin Priestly, M.D.    RHM/MEDQ  D:  10/08/2003  T:  10/08/2003  Job:  [id number]   cc:   Dani Gobble, MD  Fax: [phone]   Patrica Duel, M.D.  [address]  [place]  [place] [address]  Fax: [phone]

## 2010-08-29 NOTE — Letter (Signed)
April 08, 2006    Patrica Duel, M.D.  [address]  [place], [place] [address]   RE:  MEILING, [name]  MRN:  [id number]  /  DOB:  Aug 24, 1929   Dear Loraine Leriche:   [name] returned today in the office.  Cardiac-wise she is perfectly  stable.  Also with the changes you have made, her blood pressure is  improved.  Her biggest problem now, unfortunately, is gout.  She has had  recurrent treatments for gout from her podiatrist.  She has not seen you  yet, but I think she is going to try schedule to see you next week  regarding this.   EXAM:  Blood pressure 145/76, pulse 80.  LUNGS:  Fields are clear.  CARDIAC:  Rhythm is regular.  There is a little bit of erythema along the foot, and she says this has  improved.  She is taking 1 single colchicine a day, and her allopurinol  has been stopped by her podiatrist, according to the patient.   From a cardiac standpoint, she remains stable, and I will see her back  in followup in 6 months.  I have asked her to follow up with you next  week with regard to possible medication adjustments.  She seems to be open to all types of discussions.  I note that recently  her Hyzaar was increased to 100/12.5.  Hopefully, she might be able to  get by without any diuretic.  We will see her in cardiology in 6 months,  but please do not hesitate to let me know if I can help in her  management.    Sincerely,      [name]. Riley Kill, MD, [facility]  Electronically Signed    TDS/MedQ  DD: 04/08/2006  DT: 04/08/2006  Job #: [id number]

## 2010-08-29 NOTE — Cardiovascular Report (Signed)
[place]  Patient:    [name], [name]                       MRN: [id number] Proc. Date: 07/29/99 Attending:  [name]. Riley Kill, M.D. [facility] CC:         Cardiac Catheterization Laboratory             Patrica Duel, M.D.             Thomas C. Wall, M.D. [facility]             Alleen Borne, M.D.                        Cardiac Catheterization  PROCEDURE:  Left heart catheterization, selective coronary angiography, selective left ventriculography, saphenous vein graft angiography, selective left internal mammary angiography.  CARDIOLOGIST:  [name]. Riley Kill, M.D.  INDICATIONS:  The patient is a pleasant 77 year old female who has a known history of coronary artery disease.  She has had a previous bypass with the internal mammary placed to the LAD, and a vein graft to the diagonal.  She was studied just about one year ago, but recently has developed increasing symptoms.  As a result, she was brought to the cardiac catheterization laboratory for further evaluation. The current study was done to assess coronary anatomy.  DESCRIPTION OF PROCEDURE:  The procedure was performed from the right femoral artery using 6-French catheters.  Overall she tolerated the procedure well. There was some spasm at the ostium of the left main artery which was somewhat improved by use of intercoronary nitroglycerin.  Likewise this was the same at the origin of the right coronary artery.  We elected to give intercoronary nitroglycerin here as well.  HEMODYNAMIC DATA: Central aortic pressure:  176/76. LV pressure:  173/23.  No gradient on pullback across the aortic valve.  ANGIOGRAPHIC DATA:  On left ventriculography, the left ventricle appeared to be  normal in size.  Ventricular function was normal.  The ejection fraction was estimated and greater than 60% on a post-VPB beat.  RESULTS: 1. Left main coronary artery:  The ostium of the left main coronary artery    had some tapered ostial narrowing.  This was substantially improved by    intercoronary nitroglycerin, and initial damping was relieved by the    intercoronary nitroglycerin, with no drop in pressure with engagement    after the intercoronary nitroglycerin. 2. Left anterior descending coronary artery:  The left anterior descending    coronary artery was basically subtotally occluded.  Likewise the first    diagonal was subtotally occluded. 3. Saphenous vein graft to the diagonal artery:  Was widely patent.  There    was some retrograde filling going into the LAD. 4. Left internal mammary to the distal left anterior descending coronary artery:    Was widely patent.  There was perhaps 30%-40% narrowing at the ostium    of the left internal mammary artery at its takeoff from the subclavian.  The    left main coronary artery: 5. Circumflex coronary artery:  The left main, as previously noted, has some    tapered narrowing.  This leads into a large circumflex system which itself    appears to be free of critical disease. 6. Right coronary artery:  The right coronary artery also had some very    mild tapering at the ostium, but no more than 20% stenosis.  There was    another 20% eccentric area of plaquing in the midvessel.  There was some    moderate luminal irregularity of the distal vessel, with about 30%    narrowing at its worst.  CONCLUSIONS: 1. Normal left ventricular function. 2. Continued patency of the saphenous vein graft to the diagonal. 3. Continued patency of the internal mammary artery to the left anterior    descending coronary artery with mild tapered ostial narrowing, as noted    above. 4. Mild ostial tapering of the left main coronary artery leading into the    circumflex system. 5. Mild irregularity of the right coronary artery.  DISPOSITION:  We plan to do an exercise Cardiolite study.  This will be to assess the overall anatomy from a perfusion standpoint.  We  will also get an arterial blood gas. DD:  07/29/99 TD:  07/30/99 Job: [id number]

## 2010-08-29 NOTE — Discharge Summary (Signed)
[place]  Patient:    [name], [name]                          MRN: [id number] Adm. Date:  07/29/99 Disc. Date: 07/31/99 Attending:  Jesse Sans. Daleen Squibb, M.D. [facility] Dictator:   Gene Serpe, P.A. CC:         Patrica Duel, M.D.             Reather Littler, M.D.                           Discharge Summary  PROCEDURES:  1. Cardiac catheterization July 28, 1999.  2. CT of chest July 30, 1999.  3. Exercise Cardiolite July 30, 1999.  4. Pulmonary function tests July 31, 1999.  REASON FOR ADMISSION:  Ms. Dalal is a 77 year old female, status post two-vessel CABG in 1999 with normal LVF, who recently presented to [facility] with a complaint of exertional dyspnea.  She was seen by Dr. Daleen Squibb, who recommended proceeding with a diagnostic coronary angiogram to rule out anginal equivalent.  Please refer to dictated admission note for full details.  LABORATORY DATA:  Sodium 137, potassium 3.9, BUN 17, creatinine 0.8, glucose 296.  Room air ABG:  pH 7.4, PCO2 41.7, PO2 70, HCO3 25.8.  HOSPITAL COURSE:  The patient underwent a diagnostic coronary angiogram on July 29, 1999 by Dr. Ermalene Postin (see catheterization report for details) revealing patent grafts with normal LVF.  Specifically, there was 30% ostial LIMA and patent SVG-DX graft.  Additionally, 40% LMCA leading into CFX--improved with IC nitroglycerin.  Mild irregularities of RCA noted.  Results were reviewed with Dr. Daleen Squibb and additional studies were ordered. Specifically, CT of chest, which was negative for pulmonary embolus; exercise Cardiolite, which was negative with EF 78%; and PFTs performed on morning of discharge revealing mild obstructive disease.  Final recommendations by Dr. Fanny Skates were adjusted with up titration of Toprol from 150 to 200 mg q.d.  Discontinuation of Imdur and Lanoxin.  DISCHARGE MEDICATIONS:  1. Coated aspirin 325 mg q.d.  2. Glucophage 850 mg  t.i.d.  3. HCTZ 12.5 mg q.d.  4. Cozaar 15 mg b.i.d.  5. Neurontin 400 mg t.i.d.  6. Lopid 600 mg b.i.d.  7. Toprol XL 200 mg q.h.s.  8. Actos 15 mg q.d.  9. Insulin as directed. 10. Nitrostat as directed.  DISCHARGE INSTRUCTIONS:  The patient is to refrain from heavy lifting/driving x 2 days and maintain a low fat/cholesterol and ADA diet.  She is to call our office if there is any swelling/bleeding at the groin.  FOLLOW-UP:  The patient is instructed to schedule a followup appointment with Dr. Daleen Squibb in the ensuing 2-3 weeks at the cardiology clinic in [place].  DISCHARGE DIAGNOSES:  1. Exertional dyspnea/hypoxemia.     a. Multifactorial.     b. Mild obstructive disease--by pulmonary function tests.     c. Negative chest CT for pulmonary embolus.  2. Coronary artery disease.     a. Patent left internal mammary artery graft-left anterior        descending/saphenous vein graft-DX--cardiac catheterization April        17,2001.     b. Negative exercise Cardiolite (postcatheterization).     c. Preserved left ventricular function.     d. Status post two-vessel coronary artery bypass graft 1999.  3. Diabetes mellitus--insulin requiring.  4. Hyperlipidemia.  5. Hypertension.  DD:  07/31/99 TD:  07/31/99 Job: [id number]

## 2010-08-29 NOTE — Op Note (Signed)
NAMEAELYN, STANALAND                 ACCOUNT NO.:  [id number]   MEDICAL RECORD NO.:  [id number]          PATIENT TYPE:  AMB   LOCATION:  DAY                           FACILITY:  [facility]   PHYSICIAN:  Lionel December, M.D.    DATE OF BIRTH:  1930/03/19   DATE OF PROCEDURE:  11/17/2005  DATE OF DISCHARGE:                                 OPERATIVE REPORT   PROCEDURE:  Esophagogastroduodenoscopy, followed by colonoscopy which was  incomplete.   INDICATIONS:  [name] is a 77 year old Caucasian female with multiple medical  problems who has nausea, mid and upper abdominal pain which is also  experienced in the infrascapular areas, whose ultrasound and hepatobiliary  scan have been negative.  She is undergoing diagnostic EGD.  She is also  undergoing colonoscopy for surveillance purposes.  She had a tubular adenoma  removed from her colon back in 1998.  Procedures and risks were reviewed the  patient. Informed consent was obtained.   MEDICATIONS FOR CONSCIOUS SEDATION:  1.  Benzocaine spray for pharyngeal topical anesthesia.  2.  Demerol 50 mg IV.  3.  Versed 10 mg IV.   FINDINGS:  Procedure performed in endoscopy suite.  The patient's vital  signs and O2 saturation were monitored during procedure and remained stable.   PROCEDURE:  Esophagogastroduodenoscopy.  The patient was placed in the left  lateral recumbent position, and Olympus videoscope was passed through the  oropharynx without any difficulty into the esophagus.   Esophagus.  Mucosa of the esophagus was normal.  GE junction was wavy, with  a focal area with erythema and edema, right GE junction.  The GE junction  was at 40 cm from the incisors.   Esophagus.  Mucosa of the esophagus was normal, except for focal edema and  erythema at GE junction which was located at 40 cm from the incisors.  No  ring, stricture, or hernia was noted.   Stomach.  It was empty and distended very well with insufflation.  Folds in  proximal stomach  were normal.  Examination of the mucosa revealed single  antral erosion along with patchy erythema and granularity.  No ulcer crater  was found.  Pyloric channel was patent.  Angularis, fundus, and cardia were  examined by retroflexing the scope and were normal.   Duodenum.  Bulbar mucosa was normal.  Scope was passed to the second part of  duodenum, where mucosa and folds were normal.  Endoscope was withdrawn.  The  patient prepared for procedure #2.   Colonoscopy.  Rectal examination performed.  No abnormality noted on  external or digital exam.  Olympus videoscope was placed rectum and advanced  under vision into the sigmoid colon, which was very tortuous with scattered  diverticula.  Scope was passed into junction of sigmoid and descending  colon, where she had some feces and multiple diverticula.  Could not see the  lumen.  Did not attempt to pass the scope blindly because of presence of  diverticula.  The patient was turned from the left side onto her back, but  without any benefit.  Therefore,  the scope was not passed any further.  No  polyps and/or masses were noted in the areas that were examined.  Rectal  mucosa was evaluated and was normal.  Scope was retroflexed to examine  anorectal junction which was unremarkable.  Endoscope was straightened and  withdrawn.  The patient tolerated the procedures fairly well.   FINAL DIAGNOSES:  1.  Mild changes of reflux esophagitis limited to the gastroesophageal      junction.  2.  Erosive gastritis.  Rule out Helicobacter pylori infection.  3.  Colonoscopy limited to sigmoidoscopy because of a very tortuous junction      of sigmoid and descending colon with multiple diverticula.   RECOMMENDATIONS:  1.  She will continue antireflux measures and Protonix 40 mg p.o. q.a.m.,      which was started when she was seen in the office 5 weeks ago.  2.  She can resume her colchicine for acute gouty arthritis of the right      knee.  She may want  to hold off her allopurinol until she has recovered      from acute episode.  3.  We will bring her back for barium enema at a later date.  4.  As far as her GI symptoms are concerned, I would recommend      abdominopelvic CT.  We will schedule when she is recovered from her      gouty arthritis.  5.  She will also need to be scheduled for barium enema at a later date.  6.  High-fiber diet.      Lionel December, M.D.  Electronically Signed     NR/MEDQ  D:  11/17/2005  T:  11/17/2005  Job:  [id number]   cc:   [name]. Riley Kill, M.D. [facility]  [address]  [address]  [place]  [place] [address]

## 2010-09-03 ENCOUNTER — Other Ambulatory Visit: Payer: Self-pay | Admitting: Hematology & Oncology

## 2010-09-03 ENCOUNTER — Encounter (HOSPITAL_BASED_OUTPATIENT_CLINIC_OR_DEPARTMENT_OTHER): Payer: Medicare Other | Admitting: Hematology & Oncology

## 2010-09-03 DIAGNOSIS — C7A098 Malignant carcinoid tumors of other sites: Secondary | ICD-10-CM

## 2010-09-03 DIAGNOSIS — C9110 Chronic lymphocytic leukemia of B-cell type not having achieved remission: Secondary | ICD-10-CM

## 2010-09-03 LAB — CBC WITH DIFFERENTIAL (CANCER CENTER ONLY)
BASO#: 0.1 10*3/uL (ref 0.0–0.2)
EOS%: 2.6 % (ref 0.0–7.0)
Eosinophils Absolute: 0.5 10*3/uL (ref 0.0–0.5)
HGB: 11.5 g/dL — ABNORMAL LOW (ref 11.6–15.9)
LYMPH#: 5 10*3/uL — ABNORMAL HIGH (ref 0.9–3.3)
MONO#: 1.6 10*3/uL — ABNORMAL HIGH (ref 0.1–0.9)
NEUT#: 11.1 10*3/uL — ABNORMAL HIGH (ref 1.5–6.5)
RBC: 3.79 10*6/uL (ref 3.70–5.32)
WBC: 18.1 10*3/uL — ABNORMAL HIGH (ref 3.9–10.0)

## 2010-09-03 LAB — TECHNOLOGIST REVIEW CHCC SATELLITE

## 2010-09-04 LAB — IRON AND TIBC: TIBC: 298 ug/dL (ref 250–470)

## 2010-09-04 LAB — RETICULOCYTES (CHCC)
ABS Retic: 46.3 10*3/uL (ref 19.0–186.0)
Retic Ct Pct: 1.2 % (ref 0.4–3.1)

## 2010-09-04 LAB — FERRITIN: Ferritin: 176 ng/mL (ref 10–291)

## 2010-09-15 ENCOUNTER — Ambulatory Visit (INDEPENDENT_AMBULATORY_CARE_PROVIDER_SITE_OTHER): Payer: Medicare Other | Admitting: Internal Medicine

## 2010-09-15 DIAGNOSIS — D509 Iron deficiency anemia, unspecified: Secondary | ICD-10-CM

## 2010-09-15 DIAGNOSIS — K8689 Other specified diseases of pancreas: Secondary | ICD-10-CM

## 2010-09-17 ENCOUNTER — Telehealth: Payer: Self-pay | Admitting: Cardiology

## 2010-09-18 ENCOUNTER — Encounter: Payer: Self-pay | Admitting: Cardiology

## 2010-10-27 ENCOUNTER — Encounter: Payer: Self-pay | Admitting: Cardiology

## 2010-10-27 ENCOUNTER — Ambulatory Visit (INDEPENDENT_AMBULATORY_CARE_PROVIDER_SITE_OTHER): Payer: Medicare Other | Admitting: Cardiology

## 2010-10-27 VITALS — BP 150/70 | HR 57 | Resp 20 | Ht 64.0 in | Wt 159.0 lb

## 2010-10-27 DIAGNOSIS — I2510 Atherosclerotic heart disease of native coronary artery without angina pectoris: Secondary | ICD-10-CM

## 2010-10-27 DIAGNOSIS — E7800 Pure hypercholesterolemia, unspecified: Secondary | ICD-10-CM

## 2010-10-27 DIAGNOSIS — I10 Essential (primary) hypertension: Secondary | ICD-10-CM

## 2010-10-27 NOTE — Patient Instructions (Signed)
Your physician recommends that you schedule a follow-up appointment in: 4 months  

## 2010-11-03 ENCOUNTER — Telehealth: Payer: Self-pay | Admitting: Cardiology

## 2010-11-03 NOTE — Telephone Encounter (Signed)
Patient states will have a tooth remove, she needs to know if she is taken any blood thinner. According to her records pt is not taken Plavix 75 mg.  Medication is on hold, she is only taken Asprin 81 mg daily. Pt. Will ask her dentist to see if she needs to hold that medication. She will call back if needed.

## 2010-11-03 NOTE — Telephone Encounter (Signed)
Pt needs to know if she is on any blood thinner medication because she needs to have a tooth pulled asap and would like a call right away so maybe she can go today

## 2010-11-17 ENCOUNTER — Encounter (INDEPENDENT_AMBULATORY_CARE_PROVIDER_SITE_OTHER): Payer: Self-pay

## 2010-12-02 NOTE — Assessment & Plan Note (Signed)
Cannot take statins.  LDL is not too high off of treatment.

## 2010-12-02 NOTE — Progress Notes (Signed)
HPI:  [name] has not had much in the way of chest pain.  She has had other issues.  She still feels bloated since her surgery.  She had a problem with her ear, and was seen by Dr. Dayna Yu placed on amoxacillin for some issue.  It was recommend she see Dr. Christain Yu, but cannot see until July 26.  She says she will see at times some blood in her stool, but no frank melena or hematochezia.  Taking ASA as she has a history of DES in the past.    Current Outpatient Prescriptions  Medication Sig Dispense Refill  . acetaminophen (TYLENOL) 325 MG tablet Take 325 mg by mouth as needed.        Marland Kitchen amLODipine (NORVASC) 10 MG tablet Take 10 mg by mouth daily.        . AMOXICILLIN-POT CLAVULANATE PO Take 1 tablet by mouth 2 (two) times daily. For 10 days       . aspirin (ASPIR-81) 81 MG EC tablet Take 81 mg by mouth daily.        . carvedilol (COREG) 12.5 MG tablet Take 12.5 mg by mouth 2 (two) times daily.        . Cholecalciferol (VITAMIN D3) 50000 UNITS CAPS Take by mouth. Once a week       . colchicine (COLCRYS) 0.6 MG tablet Take 0.6 mg by mouth 2 (two) times daily.        Marland Kitchen docusate sodium (STOOL SOFTENER) 100 MG capsule Take 100 mg by mouth as needed.        . gabapentin (NEURONTIN) 300 MG capsule Take 300 mg by mouth 4 (four) times daily.       . insulin aspart (NOVOLOG) 100 UNIT/ML injection Inject into the skin. In the insulin pump       . loperamide (IMODIUM) 2 MG capsule Take 2 mg by mouth as needed.        . Magnesium 250 MG TABS Take by mouth 2 (two) times daily. Take 1/2 tab       . nitroGLYCERIN (NITROLINGUAL) 0.4 MG/SPRAY spray Place 1 spray under the tongue 3 (three) times daily.        Marland Kitchen olmesartan (BENICAR) 40 MG tablet Take 40 mg by mouth daily.        . Omega-3 Fatty Acids (FISH OIL) 1000 MG CAPS Take by mouth daily.        . Polysacch Fe Cmp-Fe Heme Poly (BIFERA) 28 MG TABS Take by mouth daily.        . Prenatal Multivit-Min-Fe-FA (PRENATAL VITAMINS) 0.8 MG tablet Take 1 tablet by mouth  daily.        . promethazine (PHENERGAN) 12.5 MG tablet Take 12.5 mg by mouth as needed.        . traMADol (ULTRAM) 50 MG tablet Take 50 mg by mouth every 6 (six) hours as needed.        . clopidogrel (PLAVIX) 75 MG tablet Take 75 mg by mouth daily. On hold         Allergies  Allergen Reactions  . Codeine   . Morphine   . Statins     Past Medical History  Diagnosis Date  . Other chronic pulmonary heart diseases   . Unspecified essential hypertension   . Heart disease, unspecified   . Coronary atherosclerosis of unspecified type of vessel, native or graft   . Personal history of other diseases of digestive system   . Gout, unspecified   .  Type I (juvenile type) diabetes mellitus without mention of complication, not stated as uncontrolled   . Unspecified hereditary and idiopathic peripheral neuropathy   . Esophageal reflux   . S/P coronary artery stent placement 06/21/07    Platinum protocol study. Patent at follow up  . Anemia   . GI bleed   . Primary pancreatic carcinoid tumor     Past Surgical History  Procedure Date  . Cabg     history: insertion of coronary arterystent  . Appendectomy   . Oophorectomy     w hysterectomy and bladder tacking procedure  . Tonsillectomy   . Belpharoptosis repair     right upper eyelid  . Esophagogastroduodenoscopy 07/11/2010  . Colonoscopy 10/20/06    Family History  Problem Relation Age of Onset  . Stroke Mother   . Lung cancer Father     History   Social History  . Marital Status: Widowed    Spouse Name: N/A    Number of Children: N/A  . Years of Education: N/A   Occupational History  . Not on file.   Social History Main Topics  . Smoking status: Never Smoker   . Smokeless tobacco: Not on file  . Alcohol Use: Not on file  . Drug Use: Not on file  . Sexually Active: Not on file   Other Topics Concern  . Not on file   Social History Narrative   Worked at [employer] for 41 years. Has a son who lives in [place].      ROS: Please see the HPI.  All other systems reviewed and negative.  PHYSICAL EXAM:  BP 150/70  Pulse 57  Resp 20  Ht 5\' 4"  (1.626 m)  Wt 159 lb (72.122 kg)  BMI 27.29 kg/m2  General: Well developed, well nourished, in no acute distress. Head:  Normocephalic and atraumatic. Neck: no JVD Lungs: Clear to auscultation and percussion. Heart: Normal S1 and S2.  No murmur, rubs or gallops.  Abdomen:  Normal bowel sounds; soft; non tender; no organomegaly Pulses: Pulses normal in all 4 extremities. Extremities: No clubbing or cyanosis. No edema. Neurologic: Alert and oriented x 3.  EKG:  SB.  Otherwise normal.  ASSESSMENT AND PLAN:

## 2010-12-02 NOTE — Assessment & Plan Note (Signed)
She has largely been stable.  Please see last cath data in EMR overview.  She has a patent stent in the CFX, mild progression in the RCA.  No progressive symptoms at present.  Will continue to monitor with regular follow up.

## 2010-12-02 NOTE — Assessment & Plan Note (Signed)
On multiple drugs.  Has been seeing Dr. Phillips Odor regularly.

## 2010-12-04 ENCOUNTER — Ambulatory Visit (INDEPENDENT_AMBULATORY_CARE_PROVIDER_SITE_OTHER): Payer: Medicare Other | Admitting: Otolaryngology

## 2010-12-04 DIAGNOSIS — H9209 Otalgia, unspecified ear: Secondary | ICD-10-CM

## 2010-12-09 ENCOUNTER — Encounter (INDEPENDENT_AMBULATORY_CARE_PROVIDER_SITE_OTHER): Payer: Self-pay | Admitting: Internal Medicine

## 2010-12-09 ENCOUNTER — Ambulatory Visit (INDEPENDENT_AMBULATORY_CARE_PROVIDER_SITE_OTHER): Payer: Medicare Other | Admitting: Internal Medicine

## 2010-12-09 VITALS — BP 128/70 | HR 72 | Temp 97.6°F | Ht 64.0 in | Wt 162.0 lb

## 2010-12-09 DIAGNOSIS — T8189XA Other complications of procedures, not elsewhere classified, initial encounter: Secondary | ICD-10-CM

## 2010-12-09 NOTE — Progress Notes (Signed)
Presenting complaint; abdominal wound is not healing. Subjective; idea 77 year old Caucasian female who was last seen on 09/17/2010. She had distal pancreatectomy last year which was complicated by leak which she required prolonged and repeated percutaneous drainage as well as pancreatic stenting. Craddick stent was removed several months ago and percutaneous strain was removed sometime in May this year. When I saw her last visit she had relation to she. She was advised to use silver nitrate. However this area has not healed and she has, drainage daily is blood tinged. She denies abdominal pain fever or chills. She says her appetite is good. She has had nausea at times the she says her blood glucose levels her upper and up and down but rarely of 200. She also complains of lower back pain which is felt to be a separate issue. Her weight is up about 4 pounds since her last visit. She had her lab studies by Dr. Lucianne Muss in her hemoglobin presumably was normal. Current medications; Current Outpatient Prescriptions on File Prior to Visit  Medication Sig Dispense Refill  . acetaminophen (TYLENOL) 325 MG tablet Take 325 mg by mouth as needed.        Marland Kitchen amLODipine (NORVASC) 10 MG tablet Take 5 mg by mouth daily.       Marland Kitchen aspirin (ASPIR-81) 81 MG EC tablet Take 81 mg by mouth daily.        . carvedilol (COREG) 12.5 MG tablet Take 12.5 mg by mouth 2 (two) times daily.        . Cholecalciferol (VITAMIN D3) 50000 UNITS CAPS Take by mouth. Once a week       . colchicine (COLCRYS) 0.6 MG tablet Take 0.6 mg by mouth as needed.       . docusate sodium (STOOL SOFTENER) 100 MG capsule Take 100 mg by mouth as needed.        . gabapentin (NEURONTIN) 300 MG capsule Take 300 mg by mouth 4 (four) times daily.       . insulin aspart (NOVOLOG) 100 UNIT/ML injection Inject into the skin. In the insulin pump       . loperamide (IMODIUM) 2 MG capsule Take 2 mg by mouth as needed.        . nitroGLYCERIN (NITROLINGUAL) 0.4 MG/SPRAY  spray Place 1 spray under the tongue 3 (three) times daily.        Marland Kitchen olmesartan (BENICAR) 40 MG tablet Take 40 mg by mouth daily.        . Omega-3 Fatty Acids (FISH OIL) 1000 MG CAPS Take by mouth daily.        . Polysacch Fe Cmp-Fe Heme Poly (BIFERA) 28 MG TABS Take by mouth daily.        . Prenatal Multivit-Min-Fe-FA (PRENATAL VITAMINS) 0.8 MG tablet Take 1 tablet by mouth daily.        . promethazine (PHENERGAN) 12.5 MG tablet Take 12.5 mg by mouth as needed.        . traMADol (ULTRAM) 50 MG tablet Take 50 mg by mouth every 6 (six) hours as needed.        . AMOXICILLIN-POT CLAVULANATE PO Take 1 tablet by mouth 2 (two) times daily. For 10 days       . clopidogrel (PLAVIX) 75 MG tablet Take 75 mg by mouth daily. On hold       . Magnesium 250 MG TABS Take by mouth 2 (two) times daily. Take 1/2 tab        objective; BP  128/70  Pulse 72  Temp(Src) 97.6 F (36.4 C) (Oral)  Ht 5\' 4"  (1.626 m)  Wt 162 lb (73.483 kg)  BMI 27.81 kg/m2 Patient does not appear to be in any distress. Conjunctiva is pink sclera is nonicteric oropharyngeal mucosa is normal. No neck masses or thyromegaly noted. Her abdomen is full but soft and nontender liver edge is palpable below right costal margin. In the mid epigastric region she has an open wound with what appears to be dilation tissue over centimeters in diameter and slightly larger on the last exam. A scant amount of mucopurulent drainage on the dressing. No induration noted around the site on palpation. No peripheral edema or clubbing noted Assessment; Non-healing abdominal wound site of percutaneous catheter for pancreatic fluid collection. Her abdominal exam is benign and does not suggest that the fluid has reaccumulated. I believe this is superficial process we've been watching her for over 3 months number afraid she may need to have this excised or treated surgically. Patient refuses to follow with Dr. Luretha Murphy. Therefore make an appointment for be seen  by Dr. Flonnie Hailstone at [facility].

## 2010-12-09 NOTE — Patient Instructions (Signed)
Office will make an appointment with surgeon at [facility].

## 2010-12-11 ENCOUNTER — Telehealth (INDEPENDENT_AMBULATORY_CARE_PROVIDER_SITE_OTHER): Payer: Self-pay | Admitting: *Deleted

## 2010-12-11 NOTE — Telephone Encounter (Signed)
Dr. Karilyn Cota , [name] left a message on voicemail and just wanted you to know that she has found someone to take her to [facility] to see the surgeon.

## 2010-12-13 NOTE — Telephone Encounter (Signed)
Ann, please make an appointment with Dr. Deveron Furlong at [facility].

## 2010-12-16 ENCOUNTER — Ambulatory Visit (INDEPENDENT_AMBULATORY_CARE_PROVIDER_SITE_OTHER): Payer: Medicare Other | Admitting: Internal Medicine

## 2010-12-16 NOTE — Telephone Encounter (Signed)
appt w/ Dr Flonnie Hailstone 12/30/10 @ 2:45, patient aware, notes faxed

## 2010-12-22 ENCOUNTER — Other Ambulatory Visit: Payer: Self-pay | Admitting: Hematology & Oncology

## 2010-12-22 ENCOUNTER — Encounter (HOSPITAL_BASED_OUTPATIENT_CLINIC_OR_DEPARTMENT_OTHER): Payer: Medicare Other | Admitting: Hematology & Oncology

## 2010-12-22 DIAGNOSIS — C9110 Chronic lymphocytic leukemia of B-cell type not having achieved remission: Secondary | ICD-10-CM

## 2010-12-22 DIAGNOSIS — S31109A Unspecified open wound of abdominal wall, unspecified quadrant without penetration into peritoneal cavity, initial encounter: Secondary | ICD-10-CM

## 2010-12-22 DIAGNOSIS — D72829 Elevated white blood cell count, unspecified: Secondary | ICD-10-CM

## 2010-12-22 DIAGNOSIS — C7A098 Malignant carcinoid tumors of other sites: Secondary | ICD-10-CM

## 2010-12-22 LAB — CBC WITH DIFFERENTIAL (CANCER CENTER ONLY)
BASO#: 0.1 10*3/uL (ref 0.0–0.2)
EOS%: 1.9 % (ref 0.0–7.0)
HGB: 12 g/dL (ref 11.6–15.9)
MCH: 32.2 pg (ref 26.0–34.0)
MCHC: 34.2 g/dL (ref 32.0–36.0)
MONO%: 6.7 % (ref 0.0–13.0)
NEUT#: 14.4 10*3/uL — ABNORMAL HIGH (ref 1.5–6.5)
Platelets: 362 10*3/uL (ref 145–400)
RDW: 14.9 % (ref 11.1–15.7)

## 2010-12-22 LAB — TECHNOLOGIST REVIEW CHCC SATELLITE

## 2010-12-25 LAB — WOUND CULTURE

## 2010-12-27 LAB — RETICULOCYTES (CHCC)
ABS Retic: 71.8 10*3/uL (ref 19.0–186.0)
RBC.: 3.78 MIL/uL — ABNORMAL LOW (ref 3.87–5.11)

## 2010-12-27 LAB — IRON AND TIBC: TIBC: 314 ug/dL (ref 250–470)

## 2010-12-27 LAB — FERRITIN: Ferritin: 63 ng/mL (ref 10–291)

## 2011-01-05 LAB — LIPID PANEL
Cholesterol: 170
LDL Cholesterol: 107 — ABNORMAL HIGH
Total CHOL/HDL Ratio: 5.2
Triglycerides: 104
Triglycerides: 149
VLDL: 21
VLDL: 30

## 2011-01-05 LAB — CK TOTAL AND CKMB (NOT AT ARMC)
CK, MB: 3.3
CK, MB: 4.5 — ABNORMAL HIGH
Relative Index: INVALID
Relative Index: INVALID
Total CK: 79
Total CK: 90

## 2011-01-05 LAB — BASIC METABOLIC PANEL
BUN: 16
BUN: 19
BUN: 17
Calcium: 9
Calcium: 9.7
Creatinine, Ser: 0.75
Creatinine, Ser: 0.78
Creatinine, Ser: 0.78
GFR calc Af Amer: >60
GFR calc non Af Amer: >60
GFR calc non Af Amer: >60
GFR calc non Af Amer: >60
Glucose, Bld: 84
Glucose, Bld: 195 — ABNORMAL HIGH
Potassium: 3.9
Potassium: 4.1

## 2011-01-05 LAB — CBC
HCT: 37.9
HCT: 33 — ABNORMAL LOW
HCT: 35.2 — ABNORMAL LOW
MCHC: 34
MCV: 89.4
MCV: 89.6
Platelets: 256
Platelets: 238
Platelets: 271
RDW: 15.1
RDW: 14.6
RDW: 14.7

## 2011-01-05 LAB — POCT CARDIAC MARKERS
Operator id: 216221
Troponin i, poc: <0.05
Troponin i, poc: <0.05

## 2011-01-05 LAB — DIFFERENTIAL
Basophils Absolute: 0
Eosinophils Relative: 2
Lymphocytes Relative: 15

## 2011-01-05 LAB — HEPARIN LEVEL (UNFRACTIONATED): Heparin Unfractionated: 0.28 — ABNORMAL LOW

## 2011-01-05 LAB — D-DIMER, QUANTITATIVE: D-Dimer, Quant: 0.5 — ABNORMAL HIGH

## 2011-01-05 LAB — HEMOGLOBIN A1C
Hgb A1c MFr Bld: 7.3 — ABNORMAL HIGH
Mean Plasma Glucose: 183

## 2011-01-05 LAB — CARDIAC PANEL(CRET KIN+CKTOT+MB+TROPI)
Relative Index: INVALID
Total CK: 46
Troponin I: 0.02

## 2011-01-14 ENCOUNTER — Encounter: Payer: Medicare Other | Admitting: Hematology & Oncology

## 2011-01-14 DIAGNOSIS — D509 Iron deficiency anemia, unspecified: Secondary | ICD-10-CM

## 2011-02-05 ENCOUNTER — Other Ambulatory Visit (HOSPITAL_COMMUNITY): Payer: Self-pay | Admitting: Internal Medicine

## 2011-02-05 DIAGNOSIS — Z139 Encounter for screening, unspecified: Secondary | ICD-10-CM

## 2011-02-13 ENCOUNTER — Ambulatory Visit (INDEPENDENT_AMBULATORY_CARE_PROVIDER_SITE_OTHER): Payer: Medicare Other | Admitting: Cardiology

## 2011-02-13 ENCOUNTER — Encounter: Payer: Self-pay | Admitting: Cardiology

## 2011-02-13 VITALS — BP 130/74 | HR 68 | Resp 20 | Ht 64.0 in | Wt 166.0 lb

## 2011-02-13 DIAGNOSIS — E7800 Pure hypercholesterolemia, unspecified: Secondary | ICD-10-CM

## 2011-02-13 DIAGNOSIS — I2510 Atherosclerotic heart disease of native coronary artery without angina pectoris: Secondary | ICD-10-CM

## 2011-02-13 DIAGNOSIS — I10 Essential (primary) hypertension: Secondary | ICD-10-CM

## 2011-02-13 NOTE — Patient Instructions (Signed)
Your physician recommends that you schedule a follow-up appointment in: 6 moths with Dr. Riley Kill. The office will mail you a reminder letter 2 months prior appointment date. Your physician recommends that you continue on your current medications as directed. Please refer to the Current Medication list given to you today.

## 2011-02-14 ENCOUNTER — Emergency Department (HOSPITAL_COMMUNITY)
Admission: EM | Admit: 2011-02-14 | Discharge: 2011-02-14 | Disposition: A | Discharge status: Home / Self Care | Payer: Medicare Other | Attending: Emergency Medicine | Admitting: Emergency Medicine

## 2011-02-14 DIAGNOSIS — Z79899 Other long term (current) drug therapy: Secondary | ICD-10-CM | POA: Insufficient documentation

## 2011-02-14 DIAGNOSIS — E785 Hyperlipidemia, unspecified: Secondary | ICD-10-CM | POA: Insufficient documentation

## 2011-02-14 DIAGNOSIS — I10 Essential (primary) hypertension: Secondary | ICD-10-CM | POA: Insufficient documentation

## 2011-02-14 DIAGNOSIS — R7989 Other specified abnormal findings of blood chemistry: Secondary | ICD-10-CM | POA: Insufficient documentation

## 2011-02-14 DIAGNOSIS — Z794 Long term (current) use of insulin: Secondary | ICD-10-CM | POA: Insufficient documentation

## 2011-02-14 DIAGNOSIS — E119 Type 2 diabetes mellitus without complications: Secondary | ICD-10-CM | POA: Insufficient documentation

## 2011-02-14 LAB — BASIC METABOLIC PANEL
BUN: 16 mg/dL (ref 6–23)
CO2: 27 mEq/L (ref 19–32)
Chloride: 99 mEq/L (ref 96–112)
Creatinine, Ser: 0.62 mg/dL (ref 0.50–1.10)
Glucose, Bld: 219 mg/dL — ABNORMAL HIGH (ref 70–99)

## 2011-02-14 LAB — MAGNESIUM: Magnesium: 1.4 mg/dL — ABNORMAL LOW (ref 1.5–2.5)

## 2011-02-14 LAB — DIFFERENTIAL
Eosinophils Relative: 3 % (ref 0–5)
Lymphocytes Relative: 25 % (ref 12–46)
Lymphs Abs: 4.8 10*3/uL — ABNORMAL HIGH (ref 0.7–4.0)
Neutrophils Relative %: 65 % (ref 43–77)

## 2011-02-14 LAB — CBC
HCT: 40.2 % (ref 36.0–46.0)
MCV: 96.9 fL (ref 78.0–100.0)
RBC: 4.15 MIL/uL (ref 3.87–5.11)
WBC: 19.4 10*3/uL — ABNORMAL HIGH (ref 4.0–10.5)

## 2011-02-16 ENCOUNTER — Other Ambulatory Visit: Payer: Self-pay | Admitting: Endocrinology

## 2011-02-16 MED ORDER — MAGNESIUM SULFATE (8 MEQ) IVPB: 4.0000 g | Freq: Once | INTRAVENOUS | Status: DC

## 2011-02-16 NOTE — Progress Notes (Signed)
HPI:  [name] is in for follow up.  [name] has a number of issues, none of which seem cardiac at the time.  She is having problems with neck stiffness when she turns her neck.  She also has had a possible diagnosis of leukemia, although I do not have those records.  She is seeing Dr. [name].  She has had an iron infusion as an outpatient, and does not want to take any chemotherapy, about which she seems fairly adamant.  She is also seeing Dr. Lucianne Muss.    Current Outpatient Prescriptions  Medication Sig Dispense Refill  . acetaminophen (TYLENOL) 325 MG tablet Take 325 mg by mouth as needed.        Marland Kitchen amLODipine (NORVASC) 10 MG tablet Take 5 mg by mouth daily.       Marland Kitchen aspirin (ASPIR-81) 81 MG EC tablet Take 81 mg by mouth daily.        . carvedilol (COREG) 12.5 MG tablet Take 12.5 mg by mouth 2 (two) times daily.        . Cholecalciferol (VITAMIN D3) 50000 UNITS CAPS Take by mouth. Once a week       . docusate sodium (STOOL SOFTENER) 100 MG capsule Take 100 mg by mouth as needed.        . gabapentin (NEURONTIN) 300 MG capsule Take 300 mg by mouth 4 (four) times daily.       . insulin aspart (NOVOLOG) 100 UNIT/ML injection Inject into the skin. In the insulin pump      . loperamide (IMODIUM) 2 MG capsule Take 2 mg by mouth as needed.        . Magnesium 250 MG TABS Take 500 tablets by mouth 2 (two) times daily.       . nitroGLYCERIN (NITROLINGUAL) 0.4 MG/SPRAY spray Place 1 spray under the tongue 3 (three) times daily.        Marland Kitchen olmesartan (BENICAR) 40 MG tablet Take 40 mg by mouth daily.        . Omega-3 Fatty Acids (FISH OIL) 1000 MG CAPS Take by mouth daily.        . Polysacch Fe Cmp-Fe Heme Poly (BIFERA) 28 MG TABS Take by mouth daily.        . Prenatal Multivit-Min-Fe-FA (PRENATAL VITAMINS) 0.8 MG tablet Take 1 tablet by mouth daily.        . promethazine (PHENERGAN) 12.5 MG tablet Take 12.5 mg by mouth as needed.        . traMADol (ULTRAM) 50 MG tablet Take 50 mg by mouth every 6 (six) hours as  needed.        . Magnesium Sulfate in D5W (MAGNESIUM SULFATE, 8 MEQ, IVPB) Inject 4 g into the vein once.  250 mL  0    Allergies  Allergen Reactions  . Codeine   . Morphine   . Statins     Past Medical History  Diagnosis Date  . Other chronic pulmonary heart diseases   . Unspecified essential hypertension   . Heart disease, unspecified   . Coronary atherosclerosis of unspecified type of vessel, native or graft   . Personal history of other diseases of digestive system   . Gout, unspecified   . Type I (juvenile type) diabetes mellitus without mention of complication, not stated as uncontrolled   . Unspecified hereditary and idiopathic peripheral neuropathy   . Esophageal reflux   . S/P coronary artery stent placement 06/21/07    Platinum protocol study. Patent at follow  up  . Anemia   . GI bleed   . Primary pancreatic carcinoid tumor     Past Surgical History  Procedure Date  . Cabg     history: insertion of coronary arterystent  . Appendectomy   . Oophorectomy     w hysterectomy and bladder tacking procedure  . Tonsillectomy   . Belpharoptosis repair     right upper eyelid  . Esophagogastroduodenoscopy 07/11/2010  . Colonoscopy 10/20/06    Family History  Problem Relation Age of Onset  . Stroke Mother   . Lung cancer Father     History   Social History  . Marital Status: Widowed    Spouse Name: N/A    Number of Children: N/A  . Years of Education: N/A   Occupational History  . Not on file.   Social History Main Topics  . Smoking status: Never Smoker   . Smokeless tobacco: Never Used  . Alcohol Use: No  . Drug Use: Not on file  . Sexually Active: Not on file   Other Topics Concern  . Not on file   Social History Narrative   Worked at [employer] for 41 years. Has a son who lives in [place].     ROS: Please see the HPI.  All other systems reviewed and negative.  PHYSICAL EXAM:  BP 130/74  Pulse 68  Resp 20  Ht 5\' 4"  (1.626 m)  Wt 166 lb  (75.297 kg)  BMI 28.49 kg/m2  General: Well developed, well nourished, in no acute distress. Head:  Normocephalic and atraumatic.  Some neck stiffness with turning.   Neck: no JVD Lungs: Clear to auscultation and percussion. Heart: Normal S1 and S2.  No murmur, rubs or gallops.  Abdomen:  Normal bowel sounds; soft; non tender; no organomegaly Pulses: Pulses normal in all 4 extremities. Extremities: No clubbing or cyanosis. No edema. Neurologic: Alert and oriented x 3.  EKG:  NSR.  WNL.  No acute changes.  ASSESSMENT AND PLAN:

## 2011-02-16 NOTE — Progress Notes (Signed)
Patient has hypomagnesemia and will need magnesium infusion

## 2011-03-01 NOTE — Assessment & Plan Note (Signed)
Not currently on statins, but with her other issues she has not really been amenable to this.

## 2011-03-01 NOTE — Assessment & Plan Note (Signed)
Well controlled at present

## 2011-03-01 NOTE — Assessment & Plan Note (Addendum)
She had some mild progression when last studied, but no current definite cardiac symptoms.  Will continue to treat medically at the present time.  She needs ASA only, one baby, daily, at this point in time.  Has Promus Element stent in the CFX which was patent on the last study.

## 2011-03-16 ENCOUNTER — Other Ambulatory Visit: Payer: Self-pay | Admitting: Hematology & Oncology

## 2011-03-16 ENCOUNTER — Ambulatory Visit (HOSPITAL_BASED_OUTPATIENT_CLINIC_OR_DEPARTMENT_OTHER): Payer: Medicare Other | Admitting: Hematology & Oncology

## 2011-03-16 ENCOUNTER — Other Ambulatory Visit (HOSPITAL_COMMUNITY)
Admission: RE | Admit: 2011-03-16 | Discharge: 2011-03-16 | Disposition: A | Discharge status: Home / Self Care | Payer: Medicare Other | Source: Ambulatory Visit | Attending: Hematology & Oncology | Admitting: Hematology & Oncology

## 2011-03-16 ENCOUNTER — Encounter: Payer: Self-pay | Admitting: Hematology & Oncology

## 2011-03-16 ENCOUNTER — Other Ambulatory Visit: Payer: Medicare Other | Admitting: Lab

## 2011-03-16 DIAGNOSIS — Z859 Personal history of malignant neoplasm, unspecified: Secondary | ICD-10-CM

## 2011-03-16 DIAGNOSIS — C7A8 Other malignant neuroendocrine tumors: Secondary | ICD-10-CM

## 2011-03-16 DIAGNOSIS — D72829 Elevated white blood cell count, unspecified: Secondary | ICD-10-CM

## 2011-03-16 DIAGNOSIS — D509 Iron deficiency anemia, unspecified: Secondary | ICD-10-CM

## 2011-03-16 DIAGNOSIS — C9110 Chronic lymphocytic leukemia of B-cell type not having achieved remission: Secondary | ICD-10-CM

## 2011-03-16 DIAGNOSIS — Z8509 Personal history of malignant neoplasm of other digestive organs: Secondary | ICD-10-CM

## 2011-03-16 DIAGNOSIS — E119 Type 2 diabetes mellitus without complications: Secondary | ICD-10-CM

## 2011-03-16 DIAGNOSIS — C7A098 Malignant carcinoid tumors of other sites: Secondary | ICD-10-CM

## 2011-03-16 HISTORY — DX: Elevated white blood cell count, unspecified: D72.829

## 2011-03-16 HISTORY — DX: Other malignant neuroendocrine tumors: C7A.8

## 2011-03-16 LAB — CBC WITH DIFFERENTIAL (CANCER CENTER ONLY)
BASO%: 0.5 % (ref 0.0–2.0)
EOS%: 2.8 % (ref 0.0–7.0)
HCT: 36.9 % (ref 34.8–46.6)
LYMPH#: 5.6 10*3/uL — ABNORMAL HIGH (ref 0.9–3.3)
MCHC: 34.4 g/dL (ref 32.0–36.0)
MONO#: 1.6 10*3/uL — ABNORMAL HIGH (ref 0.1–0.9)
NEUT#: 8.8 10*3/uL — ABNORMAL HIGH (ref 1.5–6.5)
NEUT%: 53 % (ref 39.6–80.0)
RDW: 15.8 % — ABNORMAL HIGH (ref 11.1–15.7)
WBC: 16.5 10*3/uL — ABNORMAL HIGH (ref 3.9–10.0)

## 2011-03-16 LAB — CHCC SATELLITE - SMEAR

## 2011-03-16 NOTE — Progress Notes (Signed)
CC:   [name], M.D. [name], M.D. [name], M.D.  DIAGNOSES: 1. Leukocytosis - reactive/benign. 2. History of stage I pancreatic neuroendocrine cancer.  CURRENT THERAPY:  Observation.  INTERVAL HISTORY:  [name] comes in for followup.  We last saw her back in September.  When we last saw her, she was having issues.  To me, I think diabetes is her biggest problem.  She did have an abdominal wound that was draining.  We did culture it when she was here.  Cultures came back negative.  Apparently she saw Dr. Flonnie Hailstone at [facility].  According to [name] he did a CT scan on her.  There was no evidence of malignancy.  She is still having problems not feeling well.  She is having some swelling in her legs.  She said her blood sugars are up and down.  She is on quite a few medications.  I suspect that these medicines are probably causing some of the leg swelling.  She is on Norvasc.  She is having a problem with low magnesium.  She does get IV magnesium every now and then.  She is not having any problems with fever.  Pain has not been much of an issue.  There has been no problem with constipation or diarrhea.  Overall, performance status is ECOG 1.  PHYSICAL EXAMINATION:  General:  This is a well-developed, well- nourished white female in no obvious distress.  Vital signs:  97.4, pulse 65, respiratory rate 18, blood pressure 157/76, weight is 168. Head and neck:  Exam shows a normocephalic, atraumatic skull.  There are no ocular or oral lesions.  Lymphs:  There are no palpable cervical or supraclavicular lymph nodes.  Lungs:  Clear bilaterally.  There are no rales, wheezes or rhonchi.  Cardiac:  Regular rate and rhythm with normal S1, S2.  There are no murmurs, rubs or bruits.  Abdomen:  Showed a soft abdomen with good bowel sounds.  She does have the ostomy site that is healing up.  No exudate is noted from the ostomy site.  There is no fluid wave.  There is no palpable  hepatosplenomegaly.  Back:  Shows no kyphosis or osteoporotic changes.  Extremities:  Shows trace edema in the lower extremities.  She has good range of her joints.  Skin:  Exam shows some diabetic skin changes in the lower extremities. Neurological:  No focal neurological deficits.  LABORATORY STUDIES:  White cell count is 16.5, hemoglobin 12.7, hematocrit 36.9, platelet count 262.  White cell differential shows 53 segs, 34 lymphs, 10 monos.  Peripheral smear shows normochromic normocytic population of red blood cells.  There are no nucleated red blood cells.  I see no rouleaux formation.  There are no target cells. I see no inclusion bodies.  White cells are mildly increased in number. She has good maturation of her white blood cells.  I do not see any atypical lymphocytes.  Platelets are adequate number and size.  IMPRESSION:  [name] is a 77 year old female with a past history of a pancreatic neuroendocrine cancer.  This is stage I.  This was resected.  I think that a lot of her problems are from the diabetes and also from all of her medications.  I just do not see that we need to put her through any tests from our point of view.  She does see several other doctors.  We did send off her blood for flow cytometry.  I just want to sure  that she does not have a monoclonal population of cells that is causing the leukocytosis.  We will go ahead and plan to get her back in another 3 months for followup.    ______________________________ Josph Macho, M.D. PRE/MEDQ  D:  03/16/2011  T:  03/16/2011  Job:  [id number]

## 2011-03-16 NOTE — Progress Notes (Signed)
This office note has been dictated.

## 2011-03-17 ENCOUNTER — Telehealth: Payer: Self-pay | Admitting: *Deleted

## 2011-03-17 NOTE — Telephone Encounter (Signed)
Mailed 06-2011 schedule °

## 2011-03-18 LAB — RETICULOCYTES (CHCC)
ABS Retic: 76.8 10*3/uL (ref 19.0–186.0)
RBC.: 3.84 MIL/uL — ABNORMAL LOW (ref 3.87–5.11)
Retic Ct Pct: 2 % (ref 0.4–2.3)

## 2011-03-18 LAB — SPEP & IFE WITH QIG
Albumin ELP: 55.9 % (ref 55.8–66.1)
Alpha-1-Globulin: 3.7 % (ref 2.9–4.9)
Alpha-2-Globulin: 11.6 % (ref 7.1–11.8)
Beta 2: 6.1 % (ref 3.2–6.5)
Beta Globulin: 5.4 % (ref 4.7–7.2)
IgA: 422 mg/dL — ABNORMAL HIGH (ref 69–380)
Total Protein, Serum Electrophoresis: 6.9 g/dL (ref 6.0–8.3)

## 2011-03-18 LAB — BETA 2 MICROGLOBULIN, SERUM: Beta-2 Microglobulin: 2.48 mg/L — ABNORMAL HIGH (ref 1.01–1.73)

## 2011-03-31 ENCOUNTER — Ambulatory Visit (INDEPENDENT_AMBULATORY_CARE_PROVIDER_SITE_OTHER): Payer: Medicare Other | Admitting: Internal Medicine

## 2011-03-31 ENCOUNTER — Encounter (INDEPENDENT_AMBULATORY_CARE_PROVIDER_SITE_OTHER): Payer: Self-pay | Admitting: Internal Medicine

## 2011-03-31 DIAGNOSIS — K8689 Other specified diseases of pancreas: Secondary | ICD-10-CM

## 2011-03-31 DIAGNOSIS — D509 Iron deficiency anemia, unspecified: Secondary | ICD-10-CM | POA: Insufficient documentation

## 2011-03-31 NOTE — Patient Instructions (Addendum)
Can take magnesium half a tablet 3 times a day with food. Keep abdominal wound covered with sterile dressing until healed.

## 2011-04-07 NOTE — Progress Notes (Signed)
Presenting complaint; Followup for pancreatic problems and iron deficiency anemia. Subjective: Patient is 77 year old Caucasian female who was last seen on 12/09/2010 she was still leaking from sites where she had  percutaneous pancreatic drain. She was referred to Dr. Carmelia Roller of [facility]. By the time patient saw him drainage had stopped. He recommended that nothing else should be done. Patient states that she still has scant amount of drainage and it scabs and has not completely healed . She denies abdominal pain nausea or vomiting. She has not experienced any more episodes of rectal bleeding or melena. She has not received any more iron infusions lately. She had flow cytometry by Dr. Margaretha Sheffield because of leukocytosis and this was unremarkable. Presently she is struggling with low serum magnesium. She is requiring intermittent infusion under the supervision of Dr. Reather Littler. She's been intolerant of higher dose of oral magnesium it causes diarrhea. Current Medications: Current Outpatient Prescriptions  Medication Sig Dispense Refill  . acetaminophen (TYLENOL) 325 MG tablet Take 325 mg by mouth as needed.        Marland Kitchen amLODipine (NORVASC) 10 MG tablet Take 5 mg by mouth daily.       Marland Kitchen aspirin (ASPIR-81) 81 MG EC tablet Take 81 mg by mouth daily.        . carvedilol (COREG) 12.5 MG tablet Take 12.5 mg by mouth 2 (two) times daily.        . Cholecalciferol (VITAMIN D3) 50000 UNITS CAPS Take by mouth. Once a week       . colchicine 0.6 MG tablet Take 0.6 mg by mouth daily.        Marland Kitchen docusate sodium (STOOL SOFTENER) 100 MG capsule Take 100 mg by mouth as needed.        . gabapentin (NEURONTIN) 300 MG capsule Take 300 mg by mouth 4 (four) times daily.       . insulin aspart (NOVOLOG) 100 UNIT/ML injection Inject into the skin. In the insulin pump      . loperamide (IMODIUM) 2 MG capsule Take 2 mg by mouth as needed.        . Magnesium 250 MG TABS Take 500 tablets by mouth 2 (two) times daily.       .  Magnesium Sulfate in D5W (MAGNESIUM SULFATE, 8 MEQ, IVPB) Inject 4 g into the vein once.  250 mL  0  . nitroGLYCERIN (NITROLINGUAL) 0.4 MG/SPRAY spray Place 1 spray under the tongue 3 (three) times daily.        Marland Kitchen olmesartan (BENICAR) 40 MG tablet Take 40 mg by mouth daily.        . Omega-3 Fatty Acids (FISH OIL) 1000 MG CAPS Take by mouth daily.        . Polysacch Fe Cmp-Fe Heme Poly (BIFERA) 28 MG TABS Take by mouth daily.        . Prenatal Multivit-Min-Fe-FA (PRENATAL VITAMINS) 0.8 MG tablet Take 1 tablet by mouth daily.        . promethazine (PHENERGAN) 12.5 MG tablet Take 12.5 mg by mouth as needed.        . traMADol (ULTRAM) 50 MG tablet Take 50 mg by mouth every 6 (six) hours as needed.        . vitamin B-12 (CYANOCOBALAMIN) 50 MCG tablet Take 50 mcg by mouth daily.          Objective: BP 118/80  Pulse 78  Temp(Src) 98.1 F (36.7 C) (Oral)  Resp 14  Ht 5\' 4"  (1.626  m)  Wt 170 lb (77.111 kg)  BMI 29.18 kg/m2  Conjunctiva is pink. Sclera is nonicteric Oral pharyngeal mucosa is normal. No neck masses or thyromegaly noted. Cardiac exam with regular rhythm normal S1 and S2. No murmur or gallop noted. Lungs are clear to auscultation. Abdomen is is full. She has small opening in epigastric region with tiny granulation tissue; no drainage or odor noted. There is small speck of stain on the dressing. On palpation her abdomen is soft and nontender and there is no mass or organomegaly. No LE edema or clubbing noted. Lab data From 03/16/2011. WBC 16.5 H&H 12.7 and 36.9 platelet count 262K. Assessment; History of pancreatic leak following distal pancreatectomy for neuroendocrine tumor complicated by recurrent cyst requiring percutaneous drainage for several months. There has been no reaccumulation of fluid since the drain was removed his area has not healed completely. She is not having any symptoms pertaining to it. She will continue keep it covered until it has healed completely. History  of anemia. H&H is now normal. Plan: She will try and magnesium half a tablet 3 times a day with food. Keep abdominal wound covered with sterile dressing until healed.  Office visit in 6 months unless new symptoms noted.

## 2011-05-20 ENCOUNTER — Telehealth: Payer: Self-pay | Admitting: Cardiology

## 2011-05-20 NOTE — Telephone Encounter (Signed)
I spoke with the pt and she complains of episodes of Chest pain in her upper chest between her breast.  The pt's last episode of pain was Tuesday and she did use NTG spray with relief.  The pt does notice this pain when she gets upset or anxious.  The pt also complains of SOB with exertion.  The pt said she can only do things around her house for about 15 minutes before she has to sit down. I scheduled the pt to see Norma Fredrickson NP on 05/21/11.

## 2011-05-20 NOTE — Telephone Encounter (Signed)
Pt calling re problems she's having

## 2011-05-21 ENCOUNTER — Ambulatory Visit (INDEPENDENT_AMBULATORY_CARE_PROVIDER_SITE_OTHER): Payer: Medicare Other | Admitting: Nurse Practitioner

## 2011-05-21 ENCOUNTER — Encounter: Payer: Self-pay | Admitting: Nurse Practitioner

## 2011-05-21 DIAGNOSIS — I10 Essential (primary) hypertension: Secondary | ICD-10-CM

## 2011-05-21 DIAGNOSIS — I2510 Atherosclerotic heart disease of native coronary artery without angina pectoris: Secondary | ICD-10-CM

## 2011-05-21 DIAGNOSIS — R079 Chest pain, unspecified: Secondary | ICD-10-CM

## 2011-05-21 MED ORDER — ISOSORBIDE MONONITRATE ER 60 MG PO TB24: 60.0000 mg | ORAL_TABLET | ORAL | Status: DC

## 2011-05-21 NOTE — Patient Instructions (Signed)
We are going to put you back on the Imdur 60 mg daily to help with your chest pain.  We will see you back in about 7 to 10 days with Dr. Riley Kill.  Use your NTG under your tongue for recurrent chest pain. May take one tablet every 5 minutes. If you are still having discomfort after 3 tablets in 15 minutes, call 911.  Call the [facility] office at [phone] if you have any questions, problems or concerns.

## 2011-05-21 NOTE — Progress Notes (Signed)
[name] Date of Birth: [birth year] Medical Record #[id number]  History of Present Illness: [name] is seen today for a work in visit. She is seen for Dr. Riley Kill. She has multiple medical issues which include CAD, prior CABG, prior PCI, HTN, DM, hyperlipidemia and anemia. Her last cath was in 2010. Stent in the LCX was patent. Grafts were patent. She continued with medical therapy. She has not had recent stress testing.  She comes in today. She is here by herself. She is quite talkative. Her history is a little hard to follow. She reports that she has been having "mild" midsternal chest pain that radiates down her left arm. She is not sure how long it has been going on, maybe for a few weeks or even months. Says she can't remember. There is no real pattern. It happens at rest. She is not very active. She has used some NTG spray but she tries to avoid it due to a bad taste. She is no longer on Imdur and does not remember why. She does feel like this is her heart pain. She always has some degree of shortness of breath and this is unchanged. She will have swelling in her legs, but it does go down overnight.   Since her last visit here, she has been diagnosed with what sounds like CLL. She is seeing Dr. [name].   Current Outpatient Prescriptions on File Prior to Visit  Medication Sig Dispense Refill  . acetaminophen (TYLENOL) 325 MG tablet Take 325 mg by mouth as needed.        Marland Kitchen amLODipine (NORVASC) 10 MG tablet Take 5 mg by mouth daily.       Marland Kitchen aspirin (ASPIR-81) 81 MG EC tablet Take 81 mg by mouth daily.        . carvedilol (COREG) 12.5 MG tablet Take 12.5 mg by mouth 2 (two) times daily.        . Cholecalciferol (VITAMIN D3) 50000 UNITS CAPS Take by mouth. Once a week       . colchicine 0.6 MG tablet Take 0.6 mg by mouth daily.        Marland Kitchen docusate sodium (STOOL SOFTENER) 100 MG capsule Take 100 mg by mouth as needed.        . gabapentin (NEURONTIN) 300 MG capsule Take 300 mg by mouth 4  (four) times daily.       . insulin aspart (NOVOLOG) 100 UNIT/ML injection Inject into the skin. In the insulin pump      . loperamide (IMODIUM) 2 MG capsule Take 2 mg by mouth as needed.        . Magnesium 250 MG TABS Take 500 tablets by mouth daily.       . Magnesium Sulfate in D5W (MAGNESIUM SULFATE, 8 MEQ, IVPB) Inject 4 g into the vein once.  250 mL  0  . nitroGLYCERIN (NITROLINGUAL) 0.4 MG/SPRAY spray Place 1 spray under the tongue every 5 (five) minutes as needed.       Marland Kitchen olmesartan (BENICAR) 40 MG tablet Take 40 mg by mouth daily.        . Omega-3 Fatty Acids (FISH OIL) 1000 MG CAPS Take by mouth daily.        . Polysacch Fe Cmp-Fe Heme Poly (BIFERA) 28 MG TABS Take by mouth daily.        . Prenatal Multivit-Min-Fe-FA (PRENATAL VITAMINS) 0.8 MG tablet Take 1 tablet by mouth daily.        . promethazine (  PHENERGAN) 12.5 MG tablet Take 12.5 mg by mouth as needed.        . traMADol (ULTRAM) 50 MG tablet Take 50 mg by mouth every 6 (six) hours as needed.        . vitamin B-12 (CYANOCOBALAMIN) 50 MCG tablet Take 50 mcg by mouth daily.        . isosorbide mononitrate (IMDUR) 60 MG 24 hr tablet Take 1 tablet (60 mg total) by mouth every morning.  30 tablet  11    Allergies  Allergen Reactions  . Codeine   . Morphine   . Statins     Past Medical History  Diagnosis Date  . Other chronic pulmonary heart diseases   . HTN (hypertension)   . Coronary atherosclerosis of unspecified type of vessel, native or graft     remote CABG in 1999 with Dr. Laneta Simmers, S/P PCI with stent to LCX in 2009  . Personal history of other diseases of digestive system   . Gout, unspecified   . Type I (juvenile type) diabetes mellitus without mention of complication, not stated as uncontrolled   . Unspecified hereditary and idiopathic peripheral neuropathy   . Esophageal reflux   . S/P coronary artery stent placement 06/21/07    Platinum protocol study. Patent at follow up  . Anemia   . GI bleed   . Primary  pancreatic carcinoid tumor   . 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 Leukocytosis 03/16/2011  . Hyperlipidemia   . CLL (chronic lymphocytic leukemia)     seeing Dr. [name]  . Pancreas disorder     prior pancreatectomy in 2011 complicated by leak requiring prolong and repeated drainage and subsequent pancreatic stent    Past Surgical History  Procedure Date  . Cabg     Prior CABG in 1999 with LIMA to LAD, SVG to the DX  . Appendectomy   . Oophorectomy     w hysterectomy and bladder tacking procedure  . Tonsillectomy   . Belpharoptosis repair     right upper eyelid  . Esophagogastroduodenoscopy 07/11/2010  . Colonoscopy 10/20/06  . Coronary stent placement 2009    LCX    History  Smoking status  . Never Smoker   Smokeless tobacco  . Never Used    History  Alcohol Use No    Family History  Problem Relation Age of Onset  . Stroke Mother   . Lung cancer Father   . Healthy Son     Review of Systems: The review of systems is per the HPI.  All other systems were reviewed and are negative.  Physical Exam: BP 172/78  Pulse 64  Ht 5\' 4"  (1.626 m)  Wt 164 lb 6.4 oz (74.571 kg)  BMI 28.22 kg/m2  SpO2 95% (Medicines just taken prior to her visit this morning). Patient is very pleasant and in no acute distress. She is quite talkative. Skin is warm and dry. Color is normal.  HEENT is unremarkable. Normocephalic/atraumatic. PERRL. Sclera are nonicteric. Neck is supple. No masses. No JVD. Lungs are clear. Cardiac exam shows a regular rate and rhythm. Abdomen is soft. Extremities are with trace edema bilaterally. Gait and ROM are intact, but she is a little unsteady.  No gross neurologic deficits noted.   LABORATORY DATA: EKG today shows sinus rhythm. She does have inferior T wave changes which are new compared to her last tracing. However,  Dr. Riley Kill reports in April of 2010 that she had inferior T wave  changes and that was the time of her last  cath.   Assessment / Plan:

## 2011-05-21 NOTE — Assessment & Plan Note (Signed)
Blood pressure is elevated here today. She has just taken her medicines. Have asked her to monitor at home. Will need to readdress on her return visit.

## 2011-05-21 NOTE — Assessment & Plan Note (Signed)
Patient presents with recurrent chest pain. Sounds like her angina equivalent. Has intermittent T wave inversion inferiorly. Last cath dates back to 2010. Her grafts and stent were patent at that time. She is not interested in repeat myoview imaging. She is not really wanting to proceed yet with repeat cath. She is agreeable to medical therapy. We will restart her Imdur at 60 mg daily. She may continue to use her sl NTG. We will see her back in about 7 to 10 days. If she has had no improvement, will try to proceed with cardiac cath. Patient is agreeable to this plan and will call if any problems develop in the interim.

## 2011-06-01 ENCOUNTER — Encounter: Payer: Self-pay | Admitting: Cardiology

## 2011-06-01 ENCOUNTER — Ambulatory Visit (INDEPENDENT_AMBULATORY_CARE_PROVIDER_SITE_OTHER): Payer: Medicare Other | Admitting: Cardiology

## 2011-06-01 VITALS — BP 154/70 | HR 66 | Wt 165.0 lb

## 2011-06-01 DIAGNOSIS — M79606 Pain in leg, unspecified: Secondary | ICD-10-CM

## 2011-06-01 DIAGNOSIS — I803 Phlebitis and thrombophlebitis of lower extremities, unspecified: Secondary | ICD-10-CM

## 2011-06-01 DIAGNOSIS — I2510 Atherosclerotic heart disease of native coronary artery without angina pectoris: Secondary | ICD-10-CM

## 2011-06-01 DIAGNOSIS — M79609 Pain in unspecified limb: Secondary | ICD-10-CM

## 2011-06-01 NOTE — Patient Instructions (Signed)
Your physician wants you to follow-up in: 6 MONTHS.  You will receive a reminder letter in the mail two months in advance. If you don't receive a letter, please call our office to schedule the follow-up appointment.  Your physician recommends that you continue on your current medications as directed. Please refer to the Current Medication list given to you today.  Your physician has requested that you have a lower extremity venous duplex (RIGHT LEG). This test is an ultrasound of the veins in the legs. It looks at venous blood flow that carries blood from the heart to the legs. Allow one hour for a Lower Venous exam.  There are no restrictions or special instructions.

## 2011-06-02 ENCOUNTER — Telehealth: Payer: Self-pay | Admitting: Cardiology

## 2011-06-02 NOTE — Telephone Encounter (Signed)
I spoke with the pt and made her aware that Dr Riley Kill wanted this test done by our office technicians.  They do not travel to [facility] to perform dopplers.  The pt will come into the office for venous duplex.

## 2011-06-02 NOTE — Telephone Encounter (Signed)
New problem:  Patient calling to discuss doppler test that done at office on 2/22. Patient stating this suppose to be done at [facility].

## 2011-06-05 ENCOUNTER — Encounter (INDEPENDENT_AMBULATORY_CARE_PROVIDER_SITE_OTHER): Payer: Medicare Other

## 2011-06-05 DIAGNOSIS — R229 Localized swelling, mass and lump, unspecified: Secondary | ICD-10-CM

## 2011-06-05 DIAGNOSIS — M79606 Pain in leg, unspecified: Secondary | ICD-10-CM

## 2011-06-05 DIAGNOSIS — I803 Phlebitis and thrombophlebitis of lower extremities, unspecified: Secondary | ICD-10-CM | POA: Insufficient documentation

## 2011-06-05 NOTE — Assessment & Plan Note (Signed)
I doubt that this represents phlebitis, but will get an US of the leg to rule out DVT.  With her pancreatic issue and CLL this will all need to be monitored.

## 2011-06-05 NOTE — Progress Notes (Signed)
HPI:  [name] is back for follow up.  She appears to no feel very well today. She had some sinus issues, and bent over a pot of steam and burned her forehead.  She was given a pain pill and some antibiotics for I presume sinus issues.  She also develop a knot on her R leg and was worried this might be a clot.  She recently saw [name] for some chest pain, but this has largely resolved.    Current Outpatient Prescriptions  Medication Sig Dispense Refill  . acetaminophen (TYLENOL) 325 MG tablet Take 325 mg by mouth as needed.        Marland Kitchen amLODipine (NORVASC) 10 MG tablet Take 5 mg by mouth daily.       Marland Kitchen aspirin (ASPIR-81) 81 MG EC tablet Take 81 mg by mouth daily.        . carvedilol (COREG) 12.5 MG tablet Take 12.5 mg by mouth 2 (two) times daily.        . Cholecalciferol (VITAMIN D3) 50000 UNITS CAPS Take by mouth. Once a week       . colchicine 0.6 MG tablet Take 0.6 mg by mouth daily.        Marland Kitchen docusate sodium (STOOL SOFTENER) 100 MG capsule Take 100 mg by mouth as needed.        . gabapentin (NEURONTIN) 300 MG capsule Take 300 mg by mouth 4 (four) times daily.       . insulin aspart (NOVOLOG) 100 UNIT/ML injection Inject into the skin. In the insulin pump      . isosorbide mononitrate (IMDUR) 60 MG 24 hr tablet Take 1 tablet (60 mg total) by mouth every morning.  30 tablet  11  . loperamide (IMODIUM) 2 MG capsule Take 2 mg by mouth as needed.        . Magnesium 250 MG TABS Take 500 tablets by mouth daily.       . Magnesium Sulfate in D5W (MAGNESIUM SULFATE, 8 MEQ, IVPB) Inject 4 g into the vein once.  250 mL  0  . nitroGLYCERIN (NITROLINGUAL) 0.4 MG/SPRAY spray Place 1 spray under the tongue every 5 (five) minutes as needed.       Marland Kitchen olmesartan (BENICAR) 40 MG tablet Take 40 mg by mouth daily.        . Omega-3 Fatty Acids (FISH OIL) 1000 MG CAPS Take by mouth daily.        . Polysacch Fe Cmp-Fe Heme Poly (BIFERA) 28 MG TABS Take by mouth daily.        . Prenatal Multivit-Min-Fe-FA  (PRENATAL VITAMINS) 0.8 MG tablet Take 1 tablet by mouth daily.        . promethazine (PHENERGAN) 12.5 MG tablet Take 12.5 mg by mouth as needed.        . traMADol (ULTRAM) 50 MG tablet Take 50 mg by mouth every 6 (six) hours as needed.        . vitamin B-12 (CYANOCOBALAMIN) 50 MCG tablet Take 50 mcg by mouth daily.          Allergies  Allergen Reactions  . Codeine   . Morphine   . Statins     Past Medical History  Diagnosis Date  . Other chronic pulmonary heart diseases   . HTN (hypertension)   . Coronary atherosclerosis of unspecified type of vessel, native or graft     remote CABG in 1999 with Dr. Laneta Simmers, S/P PCI with stent to LCX in 2009  .  Personal history of other diseases of digestive system   . Gout, unspecified   . Type I (juvenile type) diabetes mellitus without mention of complication, not stated as uncontrolled   . Unspecified hereditary and idiopathic peripheral neuropathy   . Esophageal reflux   . S/P coronary artery stent placement 06/21/07    Platinum protocol study. Patent at follow up  . Anemia   . GI bleed   . Primary pancreatic carcinoid tumor   . 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 Leukocytosis 03/16/2011  . Hyperlipidemia   . CLL (chronic lymphocytic leukemia)     seeing [name]  . Pancreas disorder     prior pancreatectomy in 2011 complicated by leak requiring prolong and repeated drainage and subsequent pancreatic stent    Past Surgical History  Procedure Date  . Cabg     Prior CABG in 1999 with LIMA to LAD, SVG to the DX  . Appendectomy   . Oophorectomy     w hysterectomy and bladder tacking procedure  . Tonsillectomy   . Belpharoptosis repair     right upper eyelid  . Esophagogastroduodenoscopy 07/11/2010  . Colonoscopy 10/20/06  . Coronary stent placement 2009    LCX    Family History  Problem Relation Age of Onset  . Stroke Mother   . Lung cancer Father   . Healthy Son     History   Social History   . Marital Status: Widowed    Spouse Name: N/A    Number of Children: N/A  . Years of Education: N/A   Occupational History  . Not on file.   Social History Main Topics  . Smoking status: Never Smoker   . Smokeless tobacco: Never Used  . Alcohol Use: No  . Drug Use: No  . Sexually Active: No   Other Topics Concern  . Not on file   Social History Narrative   Worked at [employer] for 41 years. Has a son who lives in [place].     ROS: Please see the HPI.  All other systems reviewed and negative.  PHYSICAL EXAM:  BP 154/70  Pulse 66  Wt 165 lb (74.844 kg)  General: Well developed, well nourished, in no acute distress. Head:  Small area of burns over forehead with erythema Neck: no JVD Lungs: Clear to auscultation and percussion. Heart: Normal S1 and S2.  No murmur, rubs or gallops.  Abdomen:  Normal bowel sounds; soft; non tender; no organomegaly Pulses: Pulses normal in all 4 extremities. Extremities: No clubbing or cyanosis. Over the right lower leg is a small knot, no erythema.  Doubt DVT but cannot exclude.   Neurologic: Alert and oriented x 3.  EKG:  ASSESSMENT AND PLAN:

## 2011-06-05 NOTE — Assessment & Plan Note (Signed)
Currently this is better.  Will continue to monitor.

## 2011-06-18 ENCOUNTER — Telehealth: Payer: Self-pay | Admitting: Hematology & Oncology

## 2011-06-18 ENCOUNTER — Other Ambulatory Visit (HOSPITAL_BASED_OUTPATIENT_CLINIC_OR_DEPARTMENT_OTHER): Payer: Medicare Other | Admitting: Lab

## 2011-06-18 ENCOUNTER — Ambulatory Visit (HOSPITAL_BASED_OUTPATIENT_CLINIC_OR_DEPARTMENT_OTHER): Payer: Medicare Other | Admitting: Hematology & Oncology

## 2011-06-18 DIAGNOSIS — C7A8 Other malignant neuroendocrine tumors: Secondary | ICD-10-CM

## 2011-06-18 DIAGNOSIS — Z859 Personal history of malignant neoplasm, unspecified: Secondary | ICD-10-CM

## 2011-06-18 DIAGNOSIS — D72829 Elevated white blood cell count, unspecified: Secondary | ICD-10-CM

## 2011-06-18 DIAGNOSIS — Z8509 Personal history of malignant neoplasm of other digestive organs: Secondary | ICD-10-CM

## 2011-06-18 LAB — CBC WITH DIFFERENTIAL (CANCER CENTER ONLY)
BASO%: 0.3 % (ref 0.0–2.0)
Eosinophils Absolute: 0.4 10*3/uL (ref 0.0–0.5)
HCT: 38.5 % (ref 34.8–46.6)
HGB: 12.9 g/dL (ref 11.6–15.9)
LYMPH#: 5.3 10*3/uL — ABNORMAL HIGH (ref 0.9–3.3)
LYMPH%: 28.8 % (ref 14.0–48.0)
MCV: 98 fL (ref 81–101)
MONO#: 1.4 10*3/uL — ABNORMAL HIGH (ref 0.1–0.9)
NEUT%: 60.8 % (ref 39.6–80.0)
RBC: 3.95 10*6/uL (ref 3.70–5.32)
RDW: 14.1 % (ref 11.1–15.7)
WBC: 18.3 10*3/uL — ABNORMAL HIGH (ref 3.9–10.0)

## 2011-06-18 NOTE — Telephone Encounter (Signed)
Mailed 7-22 schedule

## 2011-06-18 NOTE — Progress Notes (Signed)
This office note has been dictated.

## 2011-06-19 NOTE — Progress Notes (Signed)
CC:   [name], M.D. [name], M.D. [name], M.D.  DIAGNOSES: 1. Leukocytosis, chronic, benign. 2. History of resected stage I pancreatic neuroendocrine cancer.  CURRENT THERAPY:  Observation.  INTERIM HISTORY:  [name] comes in for follow-up.  She has multiple complaints.  Thankfully, none of these complaints were at all related to this past pancreatic neuroendocrine tumor.  She feels tired.  She has weakness.  She feels her magnesium is low.  We have checked her for any underlying hematologic issue.  Thankfully, we have never found an underlying hematologic issue.  We have done flow cytometry on peripheral blood.  This came back negative for any monoclonal population of cells.  She still has a hard time understanding that she had a cancer that was taken out.  I told that this is not a problem for her nor will ever be a problem for her.  She sees numerous doctors.  She is on numerous medications.  PHYSICAL EXAMINATION:  General Appearance:  This is a chronically ill- appearing white female in no obvious distress.  Vital Signs:  Show a temperature of 97, pulse 66, respiratory rate 18, blood pressure 170/69. Weight is 167.  Head and Neck Exam:  Shows a normocephalic, atraumatic skull.  There are no ocular or oral lesions.  There are no palpable cervical or supraclavicular lymph nodes.  Lungs:  Clear bilaterally. Cardiac Exam:  Regular rate and rhythm with a normal S1 and S2.  There are no murmurs, rubs or bruits.  Abdominal Exam:  Soft with good bowel sounds.  There is no palpable abdominal mass.  There is no fluid wave. She has a well-healed laparotomy scar.  There is no palpable hepatosplenomegaly.  Extremities:  Show no clubbing, cyanosis or edema.  LABORATORY STUDIES:  White cell count is 18, hemoglobin 12.9, hematocrit 38.5, platelet count 350.  Her ferritin is 298.  IMPRESSION:  [name] is a 78 year old white female with multiple medical problems.  Again, I  do not see any issues that we need to deal with actively.  She has reactive leukocytosis.  I do not see that a bone marrow needs to be done.  Flow cytometry has not shown any malignant issue.  I do not see that she needs any scans done.  She had been followed out at [facility] for this neuroendocrine tumor.  I am not sure if she is going back there or not.  I told her that if she needs magnesium she can probably get this at her primary physician's office.  We will see about getting her back to see [place] in another few months.    ______________________________ [name], M.D. PRE/MEDQ  D:  06/18/2011  T:  06/19/2011  Job:  [id number]

## 2011-06-23 LAB — COMPREHENSIVE METABOLIC PANEL
AST: 21 U/L (ref 0–37)
Albumin: 4 g/dL (ref 3.5–5.2)
Alkaline Phosphatase: 83 U/L (ref 39–117)
Calcium: 9.6 mg/dL (ref 8.4–10.5)
Chloride: 103 mEq/L (ref 96–112)
Glucose, Bld: 198 mg/dL — ABNORMAL HIGH (ref 70–99)
Potassium: 4 mEq/L (ref 3.5–5.3)
Sodium: 142 mEq/L (ref 135–145)
Total Protein: 7.1 g/dL (ref 6.0–8.3)

## 2011-06-23 LAB — CHROMOGRANIN A: Chromogranin A: 10.2 ng/mL (ref 1.9–15.0)

## 2011-08-03 ENCOUNTER — Other Ambulatory Visit (HOSPITAL_COMMUNITY): Payer: Self-pay | Admitting: Internal Medicine

## 2011-08-03 DIAGNOSIS — Z139 Encounter for screening, unspecified: Secondary | ICD-10-CM

## 2011-08-04 ENCOUNTER — Ambulatory Visit (HOSPITAL_COMMUNITY)
Admission: RE | Admit: 2011-08-04 | Discharge: 2011-08-04 | Disposition: A | Discharge status: Home / Self Care | Payer: Medicare Other | Source: Ambulatory Visit | Attending: Internal Medicine | Admitting: Internal Medicine

## 2011-08-04 DIAGNOSIS — Z1231 Encounter for screening mammogram for malignant neoplasm of breast: Secondary | ICD-10-CM | POA: Insufficient documentation

## 2011-08-04 DIAGNOSIS — Z139 Encounter for screening, unspecified: Secondary | ICD-10-CM

## 2011-08-24 ENCOUNTER — Telehealth: Payer: Self-pay | Admitting: Cardiology

## 2011-08-24 NOTE — Telephone Encounter (Signed)
Reviewed with Dr Antoine Poche. He did not have any new recommendations. He advised pt to monitor symptoms and call back if more symptoms.  I offered pt an appt with PA today or  Dr Riley Kill  tomorrow. Pt declined. Pt denies anymore chest or throat pain.  Pt will continue to monitor symptoms.

## 2011-08-24 NOTE — Telephone Encounter (Signed)
Spoke with pt. Pt states this morning while she was still in bed she developed chest pain and had hurting into her throat. Pt used 1 NTG spray with immediate relief. She got out of bed .10-15 minutes  later she developed the same pain relieved with 1 NTG spray. This was about 20 minutes ago. Pt states this is similar to angina pain in the past. It has been several months since she has had any chest pain.  Pt states she had shot pf prednisone Friday due to gout in her hand. Pt also taking oral prednisone. Her glucose has been elevated. She has been in contact with Dr Remus Blake office due to elevated glucose. I will review with Dr Antoine Poche.

## 2011-08-24 NOTE — Telephone Encounter (Signed)
Pt has had chest pain, took nitro, heart skipping, this am, now feels better , bp fine, pls call [phone]

## 2011-08-25 ENCOUNTER — Ambulatory Visit: Payer: Medicare Other | Admitting: Cardiology

## 2011-09-14 ENCOUNTER — Encounter (INDEPENDENT_AMBULATORY_CARE_PROVIDER_SITE_OTHER): Payer: Self-pay | Admitting: Internal Medicine

## 2011-09-14 ENCOUNTER — Ambulatory Visit (INDEPENDENT_AMBULATORY_CARE_PROVIDER_SITE_OTHER): Payer: Medicare Other | Admitting: Internal Medicine

## 2011-09-14 VITALS — BP 110/74 | HR 80 | Temp 97.2°F | Resp 18 | Ht 64.0 in | Wt 166.7 lb

## 2011-09-14 DIAGNOSIS — Z0189 Encounter for other specified special examinations: Secondary | ICD-10-CM

## 2011-09-14 DIAGNOSIS — R109 Unspecified abdominal pain: Secondary | ICD-10-CM

## 2011-09-14 DIAGNOSIS — R1030 Lower abdominal pain, unspecified: Secondary | ICD-10-CM

## 2011-09-14 DIAGNOSIS — R1013 Epigastric pain: Secondary | ICD-10-CM

## 2011-09-14 LAB — CREATININE, SERUM: Creat: 0.96 mg/dL (ref 0.50–1.10)

## 2011-09-14 NOTE — Patient Instructions (Signed)
Physician will contact you with results of CT scan.

## 2011-09-14 NOTE — Progress Notes (Signed)
Presenting complaint;  Upper abdominal fullness and pain. Lower abdominal pain. Subjective:  Patient is 78 year old Caucasian female who is here for scheduled visit for 2 complaints. She was last seen in December 2012 and appear to be doing well. She now presents with 2 complaints. She she states she's been having hypogastric pain for 3-4 months pain occurs daily and may last for several hours. This pain does not appear to be relieved with bowel movements. Pain is not intense. She complains of urinary incontinence and is using diapers. She has been evaluated by Dr. Rito Ehrlich on but workup was negative. Her bowels move daily and she denies melena or rectal bleeding. She has fair appetite and her weight is down only by 3 pounds since her last visit .this pain is fairly localized without radiation. She also complains of pain in epigastric region. She has noted bulge or protrusion. She is more aware of his pain if pushes against the edge of the table etc. she denies heartburn nausea vomiting fever or chills. He is concerned if her pancreatic cyst is recovered. She also complains of intermittent left arm pain relieved with nitroglycerin. She had an appointment with Dr. Tedra Senegal but decided to postpone it. She denies PND orthopnea or exertional dyspnea.  Current Medications: Current Outpatient Prescriptions  Medication Sig Dispense Refill  . acetaminophen (TYLENOL) 325 MG tablet Take 325 mg by mouth as needed.        Marland Kitchen allopurinol (ZYLOPRIM) 300 MG tablet Take 300 mg by mouth daily.      Marland Kitchen amLODipine (NORVASC) 10 MG tablet Take 5 mg by mouth daily.       Marland Kitchen aspirin (ASPIR-81) 81 MG EC tablet Take 81 mg by mouth daily.        . carvedilol (COREG) 12.5 MG tablet Take 12.5 mg by mouth 2 (two) times daily.        . Cholecalciferol (VITAMIN D3) 50000 UNITS CAPS Take by mouth. Once a week       . colchicine (COLCRYS) 0.6 MG tablet Take 0.6 mg by mouth daily.      Marland Kitchen docusate sodium (STOOL SOFTENER) 100 MG  capsule Take 100 mg by mouth as needed.        . gabapentin (NEURONTIN) 300 MG capsule Take 300 mg by mouth 3 (three) times daily.       Marland Kitchen ibuprofen (ADVIL,MOTRIN) 600 MG tablet Take 600 mg by mouth every 8 (eight) hours as needed.      . insulin aspart (NOVOLOG) 100 UNIT/ML injection Inject into the skin. In the insulin pump. Usually 30-50units daily.      Marland Kitchen loperamide (IMODIUM) 2 MG capsule Take 2 mg by mouth as needed.        . Magnesium 250 MG TABS Take 500 tablets by mouth daily.       . nebivolol (BYSTOLIC) 5 MG tablet Take 5 mg by mouth daily.      . nitroGLYCERIN (NITROLINGUAL) 0.4 MG/SPRAY spray Place 1 spray under the tongue every 5 (five) minutes as needed.       Marland Kitchen olmesartan (BENICAR) 40 MG tablet Take 40 mg by mouth daily.        . Omega-3 Fatty Acids (FISH OIL) 1000 MG CAPS Take by mouth daily.        . polyethylene glycol (MIRALAX / GLYCOLAX) packet Take 17 g by mouth as needed.      . Polysacch Fe Cmp-Fe Heme Poly (BIFERA) 28 MG TABS Take by mouth daily.        Marland Kitchen  Prenatal Multivit-Min-Fe-FA (PRENATAL VITAMINS) 0.8 MG tablet Take 1 tablet by mouth daily.        . promethazine (PHENERGAN) 12.5 MG tablet Take 12.5 mg by mouth as needed.        . isosorbide mononitrate (IMDUR) 60 MG 24 hr tablet Take 1 tablet (60 mg total) by mouth every morning.  30 tablet  11   Past medical history; Diabetes mellitus of several years duration. Hypertension. Peripheral neuropathy. Coronary artery disease status post CABG in 1999. She suffered MI in March 2009 and extends into left circumflex artery. History of gout. Hyperlipidemia. History of colonic polyps. Last colonoscopy was in July 2008 [facility] revealing few diverticula sigmoid colon as well as descending and ascending colon. She was evaluated for iron deficiency anemia and GI bleed in March 2012 and EGD was negative. It was felt she may have bled from a right-sided colonic diverticula. History of hypomagnesemia. Tonsillectomy. Eye surgery  to correct bilateral ptosis in 2003. BSO with hysterectomy several years. History of erosive esophagitis but presently free of symptoms. 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 Percutaneous drain was removed over a year ago. Abdominal wound took several months to heal completely. Leukocytosis. Initially thought to be due to CLL a doctor and never feels this is of benign etiology based on negative flow cytometry.   Objective: Blood pressure 110/74, pulse 80, temperature 97.2 F (36.2 C), temperature source Oral, resp. rate 18, height 5\' 4"  (1.626 m), weight 166 lb 11.2 oz (75.615 kg). Patient is alert and does not appear to be in any disc Conjunctiva is pink. Sclera is nonicteric Oropharyngeal mucosa is normal. No neck masses or thyromegaly noted. Cardiac exam with regular rhythm normal S1 and S2. No murmur or gallop noted. Lungs are clear to auscultation. Abdomen;  No LE edema or clubbing noted.  Labs/studies Results: CBC from 06/18/2011. WBC 18.3, H&H 12.9 and 38.5 and platelet count 350K Serum ferritin was 298 Comprehensive chemistry panel normal except glucose of 198. Creatinine was 0.80, calcium 9.6 and albumin of 4.0; AST 21 and ALT 18.   Assessment:  Patient presents with both upper as well as lower abdominal pain. Upper abdominal pain may be related to history of postop pancreatic cyst she required percutaneous drainage for several months. She does have some fullness and tenderness in the epigastric region which is possibly left lobe of the liver. Finally need to rule out recurrent pancreatic pseudocyst. Hypogastric pain appears to be of recent onset. She has urinary incontinence with negative workup last year by Dr. Rito Ehrlich. On exam her bladder does not appear to be full. She does not have any bowel complaints. Doubt  that this pain is of urologic or colonic etiology.   Plan:  Abdominopelvic CT with contrast. Last serum creatinine was over 2 months ago therefore it will need to be repeated. Patient encouraged to call Dr. Rosalyn Charters office for an early appointment.

## 2011-09-16 ENCOUNTER — Encounter (INDEPENDENT_AMBULATORY_CARE_PROVIDER_SITE_OTHER): Payer: Self-pay | Admitting: *Deleted

## 2011-09-16 NOTE — Telephone Encounter (Signed)
Error   This encounter was created in error - please disregard. 

## 2011-09-18 ENCOUNTER — Ambulatory Visit (HOSPITAL_COMMUNITY)
Admission: RE | Admit: 2011-09-18 | Discharge: 2011-09-18 | Disposition: A | Discharge status: Home / Self Care | Payer: Medicare Other | Source: Ambulatory Visit | Attending: Internal Medicine | Admitting: Internal Medicine

## 2011-09-18 ENCOUNTER — Other Ambulatory Visit (INDEPENDENT_AMBULATORY_CARE_PROVIDER_SITE_OTHER): Payer: Self-pay | Admitting: Internal Medicine

## 2011-09-18 DIAGNOSIS — K219 Gastro-esophageal reflux disease without esophagitis: Secondary | ICD-10-CM

## 2011-09-18 DIAGNOSIS — R109 Unspecified abdominal pain: Secondary | ICD-10-CM | POA: Insufficient documentation

## 2011-09-18 DIAGNOSIS — R1013 Epigastric pain: Secondary | ICD-10-CM

## 2011-09-18 DIAGNOSIS — R1030 Lower abdominal pain, unspecified: Secondary | ICD-10-CM

## 2011-09-18 DIAGNOSIS — I10 Essential (primary) hypertension: Secondary | ICD-10-CM | POA: Insufficient documentation

## 2011-09-18 DIAGNOSIS — R933 Abnormal findings on diagnostic imaging of other parts of digestive tract: Secondary | ICD-10-CM | POA: Insufficient documentation

## 2011-09-18 DIAGNOSIS — K5730 Diverticulosis of large intestine without perforation or abscess without bleeding: Secondary | ICD-10-CM | POA: Insufficient documentation

## 2011-09-18 DIAGNOSIS — E119 Type 2 diabetes mellitus without complications: Secondary | ICD-10-CM | POA: Insufficient documentation

## 2011-09-18 DIAGNOSIS — R9389 Abnormal findings on diagnostic imaging of other specified body structures: Secondary | ICD-10-CM | POA: Insufficient documentation

## 2011-09-18 MED ORDER — PANTOPRAZOLE SODIUM 40 MG PO TBEC
40.0000 mg | DELAYED_RELEASE_TABLET | Freq: Every day | ORAL | Status: DC
Start: 2011-09-18 — End: 2011-10-07

## 2011-09-23 ENCOUNTER — Telehealth: Payer: Self-pay | Admitting: Cardiology

## 2011-09-23 NOTE — Telephone Encounter (Signed)
Pt calling stating she is having CP, running down left arm,SOB, and swelling in abdomen--pt advised to go to nearest ED --pt states she will only see dr stuckey--refuses to go to ED--refused to see katherine lawrence at [place] ( 3pm apponit available )--wants sooner appoint with dr stuckey--after checking with schedulers found appoint for 10/07/11/at 10:45 am--pt notified and again urged to go to nearest ED if condition gets worse--pt agrees

## 2011-09-23 NOTE — Telephone Encounter (Signed)
Pt calling re having some chest pain and pain left arm, off and on, some SOB, and swelling in stomach, pls call

## 2011-10-07 ENCOUNTER — Encounter: Payer: Self-pay | Admitting: Cardiology

## 2011-10-07 ENCOUNTER — Ambulatory Visit (INDEPENDENT_AMBULATORY_CARE_PROVIDER_SITE_OTHER): Payer: Medicare Other | Admitting: Cardiology

## 2011-10-07 VITALS — BP 126/70 | HR 73 | Ht 64.0 in | Wt 164.0 lb

## 2011-10-07 DIAGNOSIS — D509 Iron deficiency anemia, unspecified: Secondary | ICD-10-CM

## 2011-10-07 DIAGNOSIS — I4891 Unspecified atrial fibrillation: Secondary | ICD-10-CM

## 2011-10-07 DIAGNOSIS — I2510 Atherosclerotic heart disease of native coronary artery without angina pectoris: Secondary | ICD-10-CM

## 2011-10-07 LAB — CBC WITH DIFFERENTIAL/PLATELET
Basophils Relative: 0.5 % (ref 0.0–3.0)
Eosinophils Relative: 2.7 % (ref 0.0–5.0)
Hemoglobin: 13.8 g/dL (ref 12.0–15.0)
Lymphocytes Relative: 23.3 % (ref 12.0–46.0)
MCHC: 32.4 g/dL (ref 30.0–36.0)
Monocytes Relative: 9.3 % (ref 3.0–12.0)
Neutro Abs: 13.6 10*3/uL — ABNORMAL HIGH (ref 1.4–7.7)
Neutrophils Relative %: 64.2 % (ref 43.0–77.0)
RBC: 4.3 Mil/uL (ref 3.87–5.11)
WBC: 21.2 10*3/uL (ref 4.5–10.5)

## 2011-10-07 LAB — T4, FREE: Free T4: 0.81 ng/dL (ref 0.60–1.60)

## 2011-10-07 LAB — BASIC METABOLIC PANEL
CO2: 27 mEq/L (ref 19–32)
Calcium: 10 mg/dL (ref 8.4–10.5)
Creatinine, Ser: 0.7 mg/dL (ref 0.4–1.2)
Sodium: 140 mEq/L (ref 135–145)

## 2011-10-07 NOTE — Telephone Encounter (Signed)
Pt seen in the office

## 2011-10-07 NOTE — Progress Notes (Signed)
HPI:  [name] is here for followup. She is declining exercise tolerance and she says she feels nauseated.  She has some occasional chest pain, but not like what she had before.  Just does not feel right.    Current Outpatient Prescriptions  Medication Sig Dispense Refill  . acetaminophen (TYLENOL) 325 MG tablet Take 325 mg by mouth as needed.        Marland Kitchen amLODipine (NORVASC) 10 MG tablet Take 5 mg by mouth daily.       Marland Kitchen aspirin (ASPIR-81) 81 MG EC tablet Take 81 mg by mouth daily.        . carvedilol (COREG) 12.5 MG tablet Take 12.5 mg by mouth 2 (two) times daily.        . Cholecalciferol (VITAMIN D3) 50000 UNITS CAPS Take by mouth. Once a week       . docusate sodium (STOOL SOFTENER) 100 MG capsule Take 100 mg by mouth as needed.        . febuxostat (ULORIC) 40 MG tablet Take 40 mg by mouth daily.      Marland Kitchen gabapentin (NEURONTIN) 300 MG capsule Take 300 mg by mouth 3 (three) times daily.       Marland Kitchen ibuprofen (ADVIL,MOTRIN) 600 MG tablet Take 600 mg by mouth every 8 (eight) hours as needed.      . insulin aspart (NOVOLOG) 100 UNIT/ML injection Inject into the skin. In the insulin pump. Usually 30-50units daily.      Marland Kitchen loperamide (IMODIUM) 2 MG capsule Take 2 mg by mouth as needed.        . Magnesium 250 MG TABS Take 500 tablets by mouth daily.       . nitroGLYCERIN (NITROLINGUAL) 0.4 MG/SPRAY spray Place 1 spray under the tongue every 5 (five) minutes as needed.       Marland Kitchen olmesartan (BENICAR) 40 MG tablet Take 40 mg by mouth daily.        . Omega-3 Fatty Acids (FISH OIL) 1000 MG CAPS Take by mouth daily.        . pantoprazole (PROTONIX) 40 MG tablet Take 40 mg by mouth as needed.      . polyethylene glycol (MIRALAX / GLYCOLAX) packet Take 17 g by mouth as needed.      . Polysacch Fe Cmp-Fe Heme Poly (BIFERA) 28 MG TABS Take by mouth daily.        . Prenatal Multivit-Min-Fe-FA (PRENATAL VITAMINS) 0.8 MG tablet Take 1 tablet by mouth daily.        . promethazine (PHENERGAN) 12.5 MG tablet Take 12.5 mg  by mouth as needed.        Marland Kitchen DISCONTD: pantoprazole (PROTONIX) 40 MG tablet Take 1 tablet (40 mg total) by mouth daily.  30 tablet  5    Allergies  Allergen Reactions  . Ivp Dye (Iodinated Diagnostic Agents)   . Codeine   . Morphine   . Statins     Past Medical History  Diagnosis Date  . Other chronic pulmonary heart diseases   . HTN (hypertension)   . Coronary atherosclerosis of unspecified type of vessel, native or graft     remote CABG in 1999 with Dr. Laneta [facility], S/P PCI with stent to LCX in 2009  . Personal history of other diseases of digestive system   . Gout, unspecified   . Type I (juvenile type) diabetes mellitus without mention of complication, not stated as uncontrolled   . Unspecified hereditary and idiopathic peripheral neuropathy   .  Esophageal reflux   . S/P coronary artery stent placement 06/21/07    Platinum protocol study. Patent at follow up  . Anemia   . GI bleed   . Primary pancreatic carcinoid tumor   . 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 Leukocytosis 03/16/2011  . Hyperlipidemia   . CLL (chronic lymphocytic leukemia)     seeing Dr. [name]  . Pancreas disorder     prior pancreatectomy in 2011 complicated by leak requiring prolong and repeated drainage and subsequent pancreatic stent    Past Surgical History  Procedure Date  . Cabg     Prior CABG in 1999 with LIMA to LAD, SVG to the DX  . Appendectomy   . Oophorectomy     w hysterectomy and bladder tacking procedure  . Tonsillectomy   . Belpharoptosis repair     right upper eyelid  . Esophagogastroduodenoscopy 07/11/2010  . Colonoscopy 10/20/06  . Coronary stent placement 2009    LCX    Family History  Problem Relation Age of Onset  . Stroke Mother   . Lung cancer Father   . Healthy Son     History   Social History  . Marital Status: Widowed    Spouse Name: N/A    Number of Children: N/A  . Years of Education: N/A   Occupational History  . Not on file.     Social History Main Topics  . Smoking status: Never Smoker   . Smokeless tobacco: Never Used  . Alcohol Use: No  . Drug Use: No  . Sexually Active: No   Other Topics Concern  . Not on file   Social History Narrative   Worked at [employer] for 41 years. Has a son who lives in [place].     ROS: Please see the HPI.  All other systems reviewed and negative.  PHYSICAL EXAM:  BP 126/70  Pulse 73  Ht 5\' 4"  (1.626 m)  Wt 164 lb (74.39 kg)  BMI 28.15 kg/m2  General: Well developed, well nourished, in no acute distress. Head:  Normocephalic and atraumatic. Neck: hard to determine.  Some elevation.   Lungs: Clear to auscultation and percussion. Heart: irregularly irregular Abdomen:  Normal bowel sounds; soft; non tender; no organomegaly Pulses: Pulses normal in all 4 extremities. Extremities: No clubbing or cyanosis. No edema. Neurologic: Alert and oriented x 3.  EKG:  Atrial fibrillation.  Nonspecific ST and T changes.  When compared to prior tracing, atrial fibrillation is new.    Over 45 minutes spent on evaluation.    ASSESSMENT AND PLAN:

## 2011-10-07 NOTE — Patient Instructions (Addendum)
Your physician has requested that you have an echocardiogram on 10/13/11. Echocardiography is a painless test that uses sound waves to create images of your heart. It provides your doctor with information about the size and shape of your heart and how well your heart's chambers and valves are working. This procedure takes approximately one hour. There are no restrictions for this procedure.  Your physician recommends that you have lab work today: BMP, TSH, Free T4 and CBC  Your physician recommends that you schedule a follow-up appointment on: Tuesday (10/13/11)  Your physician has recommended you make the following change in your medication: STOP Bystolic

## 2011-10-08 ENCOUNTER — Encounter: Payer: Self-pay | Admitting: Cardiology

## 2011-10-08 NOTE — Telephone Encounter (Signed)
This encounter was created in error - please disregard.

## 2011-10-08 NOTE — Telephone Encounter (Signed)
Will forward to dr stuckey's nurse

## 2011-10-08 NOTE — Telephone Encounter (Signed)
Fu call °Patient returning your call about blood work °

## 2011-10-12 DIAGNOSIS — I214 Non-ST elevation (NSTEMI) myocardial infarction: Secondary | ICD-10-CM

## 2011-10-12 HISTORY — PX: CORONARY ANGIOPLASTY WITH STENT PLACEMENT: SHX49

## 2011-10-12 HISTORY — DX: Non-ST elevation (NSTEMI) myocardial infarction: I21.4

## 2011-10-13 ENCOUNTER — Ambulatory Visit (HOSPITAL_COMMUNITY): Payer: Medicare Other | Attending: Cardiology

## 2011-10-13 ENCOUNTER — Ambulatory Visit (INDEPENDENT_AMBULATORY_CARE_PROVIDER_SITE_OTHER): Payer: Medicare Other | Admitting: Cardiology

## 2011-10-13 ENCOUNTER — Encounter: Payer: Self-pay | Admitting: Cardiology

## 2011-10-13 VITALS — BP 132/84 | HR 72 | Ht 64.0 in | Wt 166.0 lb

## 2011-10-13 DIAGNOSIS — R072 Precordial pain: Secondary | ICD-10-CM

## 2011-10-13 DIAGNOSIS — C9590 Leukemia, unspecified not having achieved remission: Secondary | ICD-10-CM | POA: Insufficient documentation

## 2011-10-13 DIAGNOSIS — I059 Rheumatic mitral valve disease, unspecified: Secondary | ICD-10-CM | POA: Insufficient documentation

## 2011-10-13 DIAGNOSIS — I4891 Unspecified atrial fibrillation: Secondary | ICD-10-CM

## 2011-10-13 DIAGNOSIS — I10 Essential (primary) hypertension: Secondary | ICD-10-CM | POA: Insufficient documentation

## 2011-10-13 DIAGNOSIS — I2510 Atherosclerotic heart disease of native coronary artery without angina pectoris: Secondary | ICD-10-CM | POA: Insufficient documentation

## 2011-10-13 DIAGNOSIS — I079 Rheumatic tricuspid valve disease, unspecified: Secondary | ICD-10-CM | POA: Insufficient documentation

## 2011-10-13 DIAGNOSIS — R079 Chest pain, unspecified: Secondary | ICD-10-CM | POA: Insufficient documentation

## 2011-10-13 DIAGNOSIS — C259 Malignant neoplasm of pancreas, unspecified: Secondary | ICD-10-CM | POA: Insufficient documentation

## 2011-10-13 DIAGNOSIS — E785 Hyperlipidemia, unspecified: Secondary | ICD-10-CM | POA: Insufficient documentation

## 2011-10-13 MED ORDER — METOPROLOL TARTRATE 25 MG PO TABS: 25.0000 mg | ORAL_TABLET | Freq: Two times a day (BID) | ORAL | Status: DC

## 2011-10-13 NOTE — Progress Notes (Signed)
Echocardiogram performed.  

## 2011-10-13 NOTE — Patient Instructions (Addendum)
DO NOT TAKE COREG AND BYSTOLIC  Your physician has recommended you make the following change in your medication: START Metoprolol Tartrate 25mg  take one tablet by mouth twice a day  Dr Riley Kill will speak with Dr Karilyn Cota about starting coumadin.  Your physician recommends that you schedule a follow-up appointment in: 1 WEEK

## 2011-10-13 NOTE — Progress Notes (Signed)
HPI:  The patient returns in followup today. Her main complaint has been that of exertional dyspnea. She has had a little bit of discomfort, but has been predominantly upper left side. She's seen Dr. Karilyn Cota recently, and spoken to him as late as last night. The patient was recently noted to be in atrial fibrillation, and we thought that she was on carvedilol. However it turned out that this may have been in her medicines bag, but she was really not taking it.  She tells me that she has had GI bleeding last year, and had to come off of clopidogrel because of it. I could not find the records of this , but it is pretty clear from her history making her high risk candidate for Coumadin anticoagulation for her atrial fibrillation. We spent some time going over her medicines, and I asked her to return next week with her entire bag again.    Current Outpatient Prescriptions  Medication Sig Dispense Refill  . acetaminophen (TYLENOL) 325 MG tablet Take 325 mg by mouth as needed.        Marland Kitchen allopurinol (ZYLOPRIM) 300 MG tablet as needed.       Marland Kitchen amLODipine (NORVASC) 10 MG tablet Take 5 mg by mouth daily.       Marland Kitchen aspirin (ASPIR-81) 81 MG EC tablet Take 81 mg by mouth daily.        . Cholecalciferol (VITAMIN D3) 50000 UNITS CAPS Take by mouth. Once a week       . COLCRYS 0.6 MG tablet       . docusate sodium (STOOL SOFTENER) 100 MG capsule Take 100 mg by mouth as needed.        . febuxostat (ULORIC) 40 MG tablet Take 40 mg by mouth daily.      Marland Kitchen gabapentin (NEURONTIN) 300 MG capsule Take 300 mg by mouth 3 (three) times daily.       Marland Kitchen ibuprofen (ADVIL,MOTRIN) 600 MG tablet Take 600 mg by mouth every 8 (eight) hours as needed.      . insulin aspart (NOVOLOG) 100 UNIT/ML injection Inject into the skin. In the insulin pump. Usually 30-50units daily.      Marland Kitchen loperamide (IMODIUM) 2 MG capsule Take 2 mg by mouth as needed.        . Magnesium 250 MG TABS Take 500 tablets by mouth daily.       . nitroGLYCERIN  (NITROLINGUAL) 0.4 MG/SPRAY spray Place 1 spray under the tongue every 5 (five) minutes as needed.       Marland Kitchen olmesartan (BENICAR) 40 MG tablet Take 40 mg by mouth daily.        . pantoprazole (PROTONIX) 40 MG tablet Take 40 mg by mouth as needed.       . polyethylene glycol (MIRALAX / GLYCOLAX) packet Take 17 g by mouth as needed.      . Polysacch Fe Cmp-Fe Heme Poly (BIFERA) 28 MG TABS Take by mouth daily.        . Prenatal Multivit-Min-Fe-FA (PRENATAL VITAMINS) 0.8 MG tablet Take 1 tablet by mouth daily.        . promethazine (PHENERGAN) 12.5 MG tablet Take 12.5 mg by mouth as needed.        . metoprolol tartrate (LOPRESSOR) 25 MG tablet Take 1 tablet (25 mg total) by mouth 2 (two) times daily.  60 tablet  3  . Omega-3 Fatty Acids (FISH OIL) 1000 MG CAPS Take by mouth daily.  Allergies  Allergen Reactions  . Ivp Dye (Iodinated Diagnostic Agents)   . Codeine   . Morphine   . Statins     Past Medical History  Diagnosis Date  . Other chronic pulmonary heart diseases   . HTN (hypertension)   . Coronary atherosclerosis of unspecified type of vessel, native or graft     remote CABG in 1999 with Dr. Laneta Simmers, S/P PCI with stent to LCX in 2009  . Personal history of other diseases of digestive system   . Gout, unspecified   . Type I (juvenile type) diabetes mellitus without mention of complication, not stated as uncontrolled   . Unspecified hereditary and idiopathic peripheral neuropathy   . Esophageal reflux   . S/P coronary artery stent placement 06/21/07    Platinum protocol study. Patent at follow up  . Anemia   . GI bleed   . Primary pancreatic carcinoid tumor   . 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 Leukocytosis 03/16/2011  . Hyperlipidemia   . CLL (chronic lymphocytic leukemia)     seeing Dr. [name]  . Pancreas disorder     prior pancreatectomy in 2011 complicated by leak requiring prolong and repeated drainage and subsequent pancreatic stent     Past Surgical History  Procedure Date  . Cabg     Prior CABG in 1999 with LIMA to LAD, SVG to the DX  . Appendectomy   . Oophorectomy     w hysterectomy and bladder tacking procedure  . Tonsillectomy   . Belpharoptosis repair     right upper eyelid  . Esophagogastroduodenoscopy 07/11/2010  . Colonoscopy 10/20/06  . Coronary stent placement 2009    LCX    Family History  Problem Relation Age of Onset  . Stroke Mother   . Lung cancer Father   . Healthy Son     History   Social History  . Marital Status: Widowed    Spouse Name: N/A    Number of Children: N/A  . Years of Education: N/A   Occupational History  . Not on file.   Social History Main Topics  . Smoking status: Never Smoker   . Smokeless tobacco: Never Used  . Alcohol Use: No  . Drug Use: No  . Sexually Active: No   Other Topics Concern  . Not on file   Social History Narrative   Worked at [employer] for 41 years. Has a son who lives in [place].     ROS: Please see the HPI.  All other systems reviewed and negative.  PHYSICAL EXAM:  BP 132/84  Pulse 72  Ht 5\' 4"  (1.626 m)  Wt 166 lb (75.297 kg)  BMI 28.49 kg/m2  General: Well developed, well nourished, in no acute distress. Head:  Normocephalic and atraumatic. Neck: no JVD Lungs: Clear to auscultation and percussion. Heart: Normal S1 and S2.  No murmur, rubs or gallops.  Abdomen:  Normal bowel sounds; soft; non tender; no organomegaly Pulses: Pulses normal in all 4 extremities. Extremities: No clubbing or cyanosis. No edema. Neurologic: Alert and oriented x 3.  EKG:  Atrial fib with controlled ventricular response.    ASSESSMENT AND PLAN:

## 2011-10-14 ENCOUNTER — Telehealth: Payer: Self-pay

## 2011-10-14 NOTE — Telephone Encounter (Signed)
Dr Riley Kill spoke with Dr Karilyn Cota and approved the pt taking Coumadin. Per Dr Riley Kill he would like the pt to start Warfarin tomorrow 2.5mg  once daily and have an INR checked on 10/19/11 at the [facility] office. I spoke with the pt and gave her instructions about this medication, Rx called into pharmacy.  I also spoke with the pt about her diet, importance of INR checks and monitoring her bowel movements for blood.  The pt will have her INR checked at 3:00 on 10/19/11.

## 2011-10-18 NOTE — Assessment & Plan Note (Signed)
She seems to be stable from the coronary standpoint.  Continued medical therapy warranted at this time.

## 2011-10-18 NOTE — Assessment & Plan Note (Addendum)
Situation is difficult.  I discussed her case in detail with Dr.Rehman.  He feels as though she is a candidate for anticoagulant treatment at this point in time.  We have our concerns regarding her reliability, but will attempt to get her in to see [name] in the [place] coumadin clinic.  She could be followed closely there, and will need to get fairly frequent CBCs, so I will follow [name] closely myself.  She also has a wbc of 21k, in part related to her additional issues.

## 2011-10-19 ENCOUNTER — Ambulatory Visit (INDEPENDENT_AMBULATORY_CARE_PROVIDER_SITE_OTHER): Payer: Medicare Other | Admitting: *Deleted

## 2011-10-19 DIAGNOSIS — Z7901 Long term (current) use of anticoagulants: Secondary | ICD-10-CM

## 2011-10-19 DIAGNOSIS — I4891 Unspecified atrial fibrillation: Secondary | ICD-10-CM

## 2011-10-19 LAB — POCT INR: INR: 1.3

## 2011-10-20 ENCOUNTER — Ambulatory Visit (INDEPENDENT_AMBULATORY_CARE_PROVIDER_SITE_OTHER): Payer: Medicare Other | Admitting: Cardiology

## 2011-10-20 VITALS — BP 124/68 | Ht 64.0 in | Wt 165.0 lb

## 2011-10-20 DIAGNOSIS — I2510 Atherosclerotic heart disease of native coronary artery without angina pectoris: Secondary | ICD-10-CM

## 2011-10-20 DIAGNOSIS — I4891 Unspecified atrial fibrillation: Secondary | ICD-10-CM

## 2011-10-20 DIAGNOSIS — I10 Essential (primary) hypertension: Secondary | ICD-10-CM

## 2011-10-20 NOTE — Assessment & Plan Note (Signed)
No present angina.

## 2011-10-20 NOTE — Assessment & Plan Note (Signed)
Controlled at present time.  

## 2011-10-20 NOTE — Progress Notes (Signed)
HPI:  The patient is seen in followup. I had a long discussion with her gastroenterologist. He felt that she would be stable for the use of warfarin. She's been seen in the clinic in [place], and they're following her closely. Her last INR was 1.3, and she is scheduled to be seen again in followup on Monday. She and I discussed the reasons for the Coumadin, and the need for cold careful and close followup. We may ultimately, if she does not have bleeding episodes, consider use of a oral anti-thrombin but does not require monitoring. I will continue to follow her closely.  Current Outpatient Prescriptions  Medication Sig Dispense Refill  . acetaminophen (TYLENOL) 325 MG tablet Take 325 mg by mouth as needed.        Marland Kitchen allopurinol (ZYLOPRIM) 300 MG tablet as needed.       Marland Kitchen amLODipine (NORVASC) 10 MG tablet Take 5 mg by mouth daily.       . Cholecalciferol (VITAMIN D3) 50000 UNITS CAPS Take by mouth. Once a week       . COLCRYS 0.6 MG tablet As directed      . febuxostat (ULORIC) 40 MG tablet Take 40 mg by mouth daily.      Marland Kitchen gabapentin (NEURONTIN) 300 MG capsule Take 300 mg by mouth 3 (three) times daily.       Marland Kitchen ibuprofen (ADVIL,MOTRIN) 600 MG tablet Take 600 mg by mouth every 8 (eight) hours as needed.      . insulin aspart (NOVOLOG) 100 UNIT/ML injection Inject into the skin. In the insulin pump. Usually 30-50units daily.      Marland Kitchen loperamide (IMODIUM) 2 MG capsule Take 2 mg by mouth as needed.        . Magnesium 250 MG TABS Take 500 tablets by mouth daily.       . metoprolol tartrate (LOPRESSOR) 25 MG tablet Take 1 tablet (25 mg total) by mouth 2 (two) times daily.  60 tablet  3  . nitroGLYCERIN (NITROLINGUAL) 0.4 MG/SPRAY spray Place 1 spray under the tongue every 5 (five) minutes as needed.       Marland Kitchen olmesartan (BENICAR) 40 MG tablet Take 40 mg by mouth daily.        . Omega-3 Fatty Acids (FISH OIL) 1000 MG CAPS Take by mouth daily.        . polyethylene glycol (MIRALAX / GLYCOLAX) packet  Take 17 g by mouth as needed.      . Polysacch Fe Cmp-Fe Heme Poly (BIFERA) 28 MG TABS Take by mouth daily.        . Prenatal Multivit-Min-Fe-FA (PRENATAL VITAMINS) 0.8 MG tablet Take 1 tablet by mouth daily.        . promethazine (PHENERGAN) 12.5 MG tablet Take 12.5 mg by mouth as needed.        . warfarin (COUMADIN) 2.5 MG tablet Take 2.5 mg by mouth daily. Monday, Wednesday,Friday add a 1/2 tablet to regiment  1 tablet  0    Allergies  Allergen Reactions  . Ivp Dye (Iodinated Diagnostic Agents)   . Codeine   . Morphine   . Statins     Past Medical History  Diagnosis Date  . Other chronic pulmonary heart diseases   . HTN (hypertension)   . Coronary atherosclerosis of unspecified type of vessel, native or graft     remote CABG in 1999 with Dr. Laneta Simmers, S/P PCI with stent to LCX in 2009  . Personal history of other diseases  of digestive system   . Gout, unspecified   . Type I (juvenile type) diabetes mellitus without mention of complication, not stated as uncontrolled   . Unspecified hereditary and idiopathic peripheral neuropathy   . Esophageal reflux   . S/P coronary artery stent placement 06/21/07    Platinum protocol study. Patent at follow up  . Anemia   . GI bleed   . Primary pancreatic carcinoid tumor   . 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 Leukocytosis 03/16/2011  . Hyperlipidemia   . CLL (chronic lymphocytic leukemia)     seeing Dr. [name]  . Pancreas disorder     prior pancreatectomy in 2011 complicated by leak requiring prolong and repeated drainage and subsequent pancreatic stent    Past Surgical History  Procedure Date  . Cabg     Prior CABG in 1999 with LIMA to LAD, SVG to the DX  . Appendectomy   . Oophorectomy     w hysterectomy and bladder tacking procedure  . Tonsillectomy   . Belpharoptosis repair     right upper eyelid  . Esophagogastroduodenoscopy 07/11/2010  . Colonoscopy 10/20/06  . Coronary stent placement 2009     LCX    Family History  Problem Relation Age of Onset  . Stroke Mother   . Lung cancer Father   . Healthy Son     History   Social History  . Marital Status: Widowed    Spouse Name: N/A    Number of Children: N/A  . Years of Education: N/A   Occupational History  . Not on file.   Social History Main Topics  . Smoking status: Never Smoker   . Smokeless tobacco: Never Used  . Alcohol Use: No  . Drug Use: No  . Sexually Active: No   Other Topics Concern  . Not on file   Social History Narrative   Worked at [employer] for 41 years. Has a son who lives in [place].     ROS: Please see the HPI.  All other systems reviewed and negative.  PHYSICAL EXAM:  BP 124/68  Ht 5\' 4"  (1.626 m)  Wt 165 lb (74.844 kg)  BMI 28.32 kg/m2  General: Well developed, well nourished, in no acute distress. Head:  Normocephalic and atraumatic. Neck: no JVD Lungs: Clear to auscultation and percussion. Heart: irregularly irregular rhythm, no def murmurs.   Abdomen:  Normal bowel sounds; soft; non tender; no organomegaly Pulses: Pulses normal in all 4 extremities. Extremities: No clubbing or cyanosis. No edema. Neurologic: Alert and oriented x 3.  EKG:  Atrial fib.  Rightward axis.  Nonspecific T wave abnormality.   ASSESSMENT AND PLAN:

## 2011-10-20 NOTE — Assessment & Plan Note (Signed)
Rate is well controlled.  Her stroke risk is elevated.  She is slowly coming up on warfarin, and we will continue to watch her closely.

## 2011-10-20 NOTE — Patient Instructions (Addendum)
Your physician recommends that you continue on your current medications as directed. Please refer to the Current Medication list given to you today.  Your physician recommends that you schedule a follow-up appointment in: 2-3 WEEKS

## 2011-10-25 NOTE — Assessment & Plan Note (Signed)
No current angina.  Is not on DAPT.

## 2011-10-25 NOTE — Assessment & Plan Note (Signed)
[name] has new onset of atrial fibrillation.  She is clearly out of rhythm.  I will stop her bystolic, we will get a 2D echo and the appropriate labs, and I will see her back promptly.  I will need to discuss with Dr. Karilyn Cota wether or not she is a candidate for anticoagulation, especially in light of all of her issues.  She had bleeding on plavix in the past.  However, her CHADS vasc 2 score is quite high, and she is at risk for cardioembolic events.  Therefore, we will consider this seriously.  She does not appear to tolerate the rhythm either, possibly due to her underlying problems.

## 2011-10-25 NOTE — Assessment & Plan Note (Signed)
History.  Will need to monitor closely.

## 2011-10-26 ENCOUNTER — Ambulatory Visit (INDEPENDENT_AMBULATORY_CARE_PROVIDER_SITE_OTHER): Payer: Medicare Other | Admitting: *Deleted

## 2011-10-26 DIAGNOSIS — Z7901 Long term (current) use of anticoagulants: Secondary | ICD-10-CM

## 2011-10-26 DIAGNOSIS — I4891 Unspecified atrial fibrillation: Secondary | ICD-10-CM

## 2011-11-02 ENCOUNTER — Other Ambulatory Visit (HOSPITAL_BASED_OUTPATIENT_CLINIC_OR_DEPARTMENT_OTHER): Payer: Medicare Other | Admitting: Lab

## 2011-11-02 ENCOUNTER — Ambulatory Visit (INDEPENDENT_AMBULATORY_CARE_PROVIDER_SITE_OTHER): Payer: Medicare Other | Admitting: *Deleted

## 2011-11-02 ENCOUNTER — Ambulatory Visit (HOSPITAL_BASED_OUTPATIENT_CLINIC_OR_DEPARTMENT_OTHER): Payer: Medicare Other | Admitting: Medical

## 2011-11-02 VITALS — BP 152/98 | HR 70 | Temp 97.0°F | Ht 64.0 in | Wt 169.0 lb

## 2011-11-02 DIAGNOSIS — Z8509 Personal history of malignant neoplasm of other digestive organs: Secondary | ICD-10-CM

## 2011-11-02 DIAGNOSIS — D72829 Elevated white blood cell count, unspecified: Secondary | ICD-10-CM

## 2011-11-02 DIAGNOSIS — I4891 Unspecified atrial fibrillation: Secondary | ICD-10-CM

## 2011-11-02 DIAGNOSIS — Z7901 Long term (current) use of anticoagulants: Secondary | ICD-10-CM

## 2011-11-02 DIAGNOSIS — C7A8 Other malignant neuroendocrine tumors: Secondary | ICD-10-CM

## 2011-11-02 DIAGNOSIS — R5383 Other fatigue: Secondary | ICD-10-CM

## 2011-11-02 LAB — CBC WITH DIFFERENTIAL (CANCER CENTER ONLY)
BASO#: 0 10*3/uL (ref 0.0–0.2)
BASO%: 0.2 % (ref 0.0–2.0)
EOS%: 1.1 % (ref 0.0–7.0)
HGB: 14 g/dL (ref 11.6–15.9)
LYMPH#: 8.4 10*3/uL — ABNORMAL HIGH (ref 0.9–3.3)
LYMPH%: 43.3 % (ref 14.0–48.0)
MCH: 31.8 pg (ref 26.0–34.0)
MCHC: 34 g/dL (ref 32.0–36.0)
MCV: 94 fL (ref 81–101)
MONO%: 10.6 % (ref 0.0–13.0)
NEUT#: 8.7 10*3/uL — ABNORMAL HIGH (ref 1.5–6.5)
Platelets: 369 10*3/uL (ref 145–400)

## 2011-11-02 LAB — POCT INR: INR: 2.9

## 2011-11-02 NOTE — Progress Notes (Signed)
Patient Name : [name], [name]. MR #[id number] DOB: 11-21-1929 Encounter Date: 11/02/2011 Dictated by Eunice Blase, PA-C   Diagnoses: #1 leukocytosis, chronic, benign. #2 history of resected stage I.  Pancreatic neuroendocrine cancer.  Current therapy: Observation.  Interim history: [name] presents today for an office followup visit.  Her husband accompanies her.  She still continues to have multiple complaints that go along with her multiple comorbidities.  She is currently on Coumadin for her atrial fibrillation and is seeing Dr. Riley Kill for this .  She continues to feel tired and is having some short of breath with exertion.  Most likely secondary to her A. Fib.  We have checked her for any underlying hematologic issues, and, thankfully we have never found anything.  We have done, flow cytometry on peripheral blood, and this came back negative for any monoclonal population of cells.  Her white count is holding fairly stable around 19.5.  Back in March.  It was 18.3.  She was having some GI issues and was set up for a CT of the Abdomen/Pelvis back in June by Dr. Karilyn Cota.  There is no evidence of any recurrent.  He pancreatic fluid collection or adenopathy, there was some thickening to the posterior gastric wall, possibly due to lack of distention, there were no findings to account for the patient's hypogastric pain.  [name] did have an E.GD back in March of 2012 because of a GI bleed.  She was informed if her epigastric pain, did not improve in 4 weeks that Dr. Karilyn Cota would  consider an EGD.  She's currently on pantoprazole 40 mg by mouth every morning.  She reports, that she has also been having some problems with gout in her right hand.  She is diabetic.  She has been on prednisone for the gout.  She is on a slew of medications.  She does report some intermittent diarrhea secondary to all the medication and most recently her gout medication.  She otherwise reports she has a good appetite.  She  denies any nausea, vomiting, she denies any obvious bleeding.  She denies any generalized swelling, or lower extremity swelling.  She denies any type of flushing.  She is not having any symptoms to suggest recurrent pancreatic neuroendocrine tumor.    Review of Systems: Significant for intermittent diarrhea secondary to medication, DOE, fatigue, otherwise, Pt. Denies any changes in their vision, hearing, adenopathy, fevers, chills, nausea, vomiting, constipation, chest pain, shortness of breath, passing blood, passing out, blacking out,  any changes in skin, joints, neurologic or psychiatric except as noted.   Physical Exam: This is a pleasant, 78 year old, female, in no obvious distress Vitals: Temperature 97.0 degrees.  Pulse 70, respirations 20, blood pressure 152/98, weight 169 pounds HEENT reveals a normocephalic, atraumatic skull, no scleral icterus, no oral lesions  Neck is supple without any cervical or supraclavicular adenopathy.  Lungs are clear to auscultation bilaterally. There are no wheezes, rales or rhonci Cardiac is regular rate and rhythm with a normal S1 and S2. There are no murmurs, rubs, or bruits.  Abdomen is soft with good bowel sounds there's a palpable mass. There is no palpable hepatosplenomegaly. There is no palpable fluid wave.  Musculoskeletal no tenderness of the spine, ribs, or hips.  Extremities there are no clubbing, cyanosis, or edema.  Skin no petechia, purpura or ecchymosis Neurologic is nonfocal.  Laboratory Data: White count 19.5, hemoglobin 14.0, hematocrit 41.2, platelets 369,000 Magnesium and LDH are pending from today  Current Outpatient  Prescriptions on File Prior to Visit  Medication Sig Dispense Refill  . acetaminophen (TYLENOL) 325 MG tablet Take 325 mg by mouth as needed.        Marland Kitchen allopurinol (ZYLOPRIM) 300 MG tablet as needed.       Marland Kitchen amLODipine (NORVASC) 10 MG tablet Take 5 mg by mouth daily.       . Cholecalciferol (VITAMIN D3) 50000 UNITS  CAPS Take by mouth. Once a week       . COLCRYS 0.6 MG tablet As directed      . febuxostat (ULORIC) 40 MG tablet Take 40 mg by mouth daily.      Marland Kitchen gabapentin (NEURONTIN) 300 MG capsule Take 300 mg by mouth 3 (three) times daily.       Marland Kitchen ibuprofen (ADVIL,MOTRIN) 600 MG tablet Take 600 mg by mouth every 8 (eight) hours as needed.      . insulin aspart (NOVOLOG) 100 UNIT/ML injection Inject into the skin. In the insulin pump. Usually 30-50units daily.      Marland Kitchen loperamide (IMODIUM) 2 MG capsule Take 2 mg by mouth as needed.        . Magnesium 250 MG TABS Take 500 tablets by mouth daily.       . metoprolol tartrate (LOPRESSOR) 25 MG tablet Take 1 tablet (25 mg total) by mouth 2 (two) times daily.  60 tablet  3  . nitroGLYCERIN (NITROLINGUAL) 0.4 MG/SPRAY spray Place 1 spray under the tongue every 5 (five) minutes as needed.       Marland Kitchen olmesartan (BENICAR) 40 MG tablet Take 40 mg by mouth daily.        . Omega-3 Fatty Acids (FISH OIL) 1000 MG CAPS Take by mouth daily.        . polyethylene glycol (MIRALAX / GLYCOLAX) packet Take 17 g by mouth as needed.      . Polysacch Fe Cmp-Fe Heme Poly (BIFERA) 28 MG TABS Take by mouth daily.        . Prenatal Multivit-Min-Fe-FA (PRENATAL VITAMINS) 0.8 MG tablet Take 1 tablet by mouth daily.        . promethazine (PHENERGAN) 12.5 MG tablet Take 12.5 mg by mouth as needed.        . warfarin (COUMADIN) 2.5 MG tablet Take 2.5 mg by mouth daily. Monday, Wednesday,Friday add a 1/2 tablet to regiment  1 tablet  0   Assessment/Plan: This is a pleasant, 78 year old, female, with the following issues. #1 .  History of resected stage I.  Pancreatic neuroendocrine cancer.-She did have a CT scan of the abdomen, and pelvis back in June, which did not reveal any type of recurrent pancreatic neuroendocrine tumor.  We will continue with observation every 3-4 months.  #2 leukocytosis.  Again, this is chronic and benign.  She does not need a bone marrow test.  Flow cytometry has not  shown any issue.  We will continue with observation.  #3 comorbidities-she will continue to followup with her specialist  #4.  Followup-she will return in 3-4 months with Dr. interim for, but before then should there be questions or concerns

## 2011-11-03 LAB — LACTATE DEHYDROGENASE: LDH: 255 U/L — ABNORMAL HIGH (ref 94–250)

## 2011-11-03 LAB — MAGNESIUM: Magnesium: 1.4 mg/dL — ABNORMAL LOW (ref 1.5–2.5)

## 2011-11-04 ENCOUNTER — Inpatient Hospital Stay (HOSPITAL_COMMUNITY)
Admission: EM | Admit: 2011-11-04 | Discharge: 2011-11-14 | DRG: 249 | Disposition: A | Discharge status: Home Health | Payer: Medicare Other | Attending: Internal Medicine | Admitting: Internal Medicine

## 2011-11-04 ENCOUNTER — Other Ambulatory Visit: Payer: Self-pay

## 2011-11-04 ENCOUNTER — Encounter (HOSPITAL_COMMUNITY): Payer: Self-pay | Admitting: *Deleted

## 2011-11-04 ENCOUNTER — Emergency Department (HOSPITAL_COMMUNITY): Payer: Medicare Other

## 2011-11-04 DIAGNOSIS — Z7902 Long term (current) use of antithrombotics/antiplatelets: Secondary | ICD-10-CM

## 2011-11-04 DIAGNOSIS — F05 Delirium due to known physiological condition: Secondary | ICD-10-CM | POA: Diagnosis present

## 2011-11-04 DIAGNOSIS — M109 Gout, unspecified: Secondary | ICD-10-CM | POA: Diagnosis present

## 2011-11-04 DIAGNOSIS — D72829 Elevated white blood cell count, unspecified: Secondary | ICD-10-CM | POA: Diagnosis present

## 2011-11-04 DIAGNOSIS — Z823 Family history of stroke: Secondary | ICD-10-CM

## 2011-11-04 DIAGNOSIS — E876 Hypokalemia: Secondary | ICD-10-CM | POA: Diagnosis present

## 2011-11-04 DIAGNOSIS — G609 Hereditary and idiopathic neuropathy, unspecified: Secondary | ICD-10-CM | POA: Diagnosis present

## 2011-11-04 DIAGNOSIS — I10 Essential (primary) hypertension: Secondary | ICD-10-CM | POA: Diagnosis present

## 2011-11-04 DIAGNOSIS — I2510 Atherosclerotic heart disease of native coronary artery without angina pectoris: Secondary | ICD-10-CM

## 2011-11-04 DIAGNOSIS — Z794 Long term (current) use of insulin: Secondary | ICD-10-CM

## 2011-11-04 DIAGNOSIS — Z91041 Radiographic dye allergy status: Secondary | ICD-10-CM

## 2011-11-04 DIAGNOSIS — Z79899 Other long term (current) drug therapy: Secondary | ICD-10-CM

## 2011-11-04 DIAGNOSIS — E109 Type 1 diabetes mellitus without complications: Secondary | ICD-10-CM | POA: Diagnosis present

## 2011-11-04 DIAGNOSIS — C9110 Chronic lymphocytic leukemia of B-cell type not having achieved remission: Secondary | ICD-10-CM | POA: Diagnosis present

## 2011-11-04 DIAGNOSIS — Z7901 Long term (current) use of anticoagulants: Secondary | ICD-10-CM

## 2011-11-04 DIAGNOSIS — Z7982 Long term (current) use of aspirin: Secondary | ICD-10-CM

## 2011-11-04 DIAGNOSIS — Z888 Allergy status to other drugs, medicaments and biological substances status: Secondary | ICD-10-CM

## 2011-11-04 DIAGNOSIS — I25810 Atherosclerosis of coronary artery bypass graft(s) without angina pectoris: Secondary | ICD-10-CM | POA: Diagnosis present

## 2011-11-04 DIAGNOSIS — E7800 Pure hypercholesterolemia, unspecified: Secondary | ICD-10-CM | POA: Diagnosis present

## 2011-11-04 DIAGNOSIS — I4891 Unspecified atrial fibrillation: Secondary | ICD-10-CM | POA: Diagnosis present

## 2011-11-04 DIAGNOSIS — C7A098 Malignant carcinoid tumors of other sites: Secondary | ICD-10-CM | POA: Diagnosis present

## 2011-11-04 DIAGNOSIS — E785 Hyperlipidemia, unspecified: Secondary | ICD-10-CM | POA: Diagnosis present

## 2011-11-04 DIAGNOSIS — K219 Gastro-esophageal reflux disease without esophagitis: Secondary | ICD-10-CM | POA: Diagnosis present

## 2011-11-04 DIAGNOSIS — I214 Non-ST elevation (NSTEMI) myocardial infarction: Principal | ICD-10-CM | POA: Diagnosis present

## 2011-11-04 DIAGNOSIS — Z9861 Coronary angioplasty status: Secondary | ICD-10-CM

## 2011-11-04 HISTORY — DX: Unspecified atrial fibrillation: I48.91

## 2011-11-04 LAB — BASIC METABOLIC PANEL
BUN: 21 mg/dL (ref 6–23)
CO2: 28 mEq/L (ref 19–32)
Chloride: 97 mEq/L (ref 96–112)
Glucose, Bld: 182 mg/dL — ABNORMAL HIGH (ref 70–99)
Potassium: 3.4 mEq/L — ABNORMAL LOW (ref 3.5–5.1)
Sodium: 135 mEq/L (ref 135–145)

## 2011-11-04 LAB — GLUCOSE, CAPILLARY
Glucose-Capillary: 173 mg/dL — ABNORMAL HIGH (ref 70–99)
Glucose-Capillary: 198 mg/dL — ABNORMAL HIGH (ref 70–99)
Glucose-Capillary: 70 mg/dL (ref 70–99)

## 2011-11-04 LAB — PROTIME-INR: INR: 2.56 — ABNORMAL HIGH (ref 0.00–1.49)

## 2011-11-04 LAB — CBC WITH DIFFERENTIAL/PLATELET
HCT: 41.1 % (ref 36.0–46.0)
Hemoglobin: 13.9 g/dL (ref 12.0–15.0)
Lymphocytes Relative: 40 % (ref 12–46)
Lymphs Abs: 5.6 10*3/uL — ABNORMAL HIGH (ref 0.7–4.0)
Monocytes Absolute: 1.4 10*3/uL — ABNORMAL HIGH (ref 0.1–1.0)
Monocytes Relative: 10 % (ref 3–12)
Neutro Abs: 6.4 10*3/uL (ref 1.7–7.7)
WBC: 14 10*3/uL — ABNORMAL HIGH (ref 4.0–10.5)

## 2011-11-04 LAB — MRSA PCR SCREENING: MRSA by PCR: NEGATIVE

## 2011-11-04 LAB — CARDIAC PANEL(CRET KIN+CKTOT+MB+TROPI)
CK, MB: 3.2 ng/mL (ref 0.3–4.0)
Relative Index: INVALID (ref 0.0–2.5)
Relative Index: INVALID (ref 0.0–2.5)
Total CK: 50 U/L (ref 7–177)
Troponin I: 3.38 ng/mL (ref <0.30)
Troponin I: 3.53 ng/mL (ref <0.30)

## 2011-11-04 MED ORDER — ALPRAZOLAM 0.25 MG PO TABS: 0.2500 mg | ORAL_TABLET | Freq: Two times a day (BID) | ORAL | Status: DC | PRN

## 2011-11-04 MED ORDER — FEBUXOSTAT 40 MG PO TABS
40.0000 mg | ORAL_TABLET | Freq: Every day | ORAL | Status: DC
  Administered 2011-11-04 – 2011-11-13 (×9): 40 mg via ORAL
  Filled 2011-11-04 (×11): qty 1

## 2011-11-04 MED ORDER — LOPERAMIDE HCL 2 MG PO CAPS: 2.0000 mg | ORAL_CAPSULE | ORAL | Status: DC | PRN

## 2011-11-04 MED ORDER — ONDANSETRON HCL 4 MG/2ML IJ SOLN
4.0000 mg | Freq: Four times a day (QID) | INTRAMUSCULAR | Status: DC | PRN
  Administered 2011-11-14: 4 mg via INTRAVENOUS
  Filled 2011-11-04: qty 2

## 2011-11-04 MED ORDER — POLYETHYLENE GLYCOL 3350 17 G PO PACK
17.0000 g | PACK | ORAL | Status: DC | PRN
  Filled 2011-11-04: qty 1

## 2011-11-04 MED ORDER — POTASSIUM CHLORIDE CRYS ER 20 MEQ PO TBCR
40.0000 meq | EXTENDED_RELEASE_TABLET | Freq: Once | ORAL | Status: AC
  Administered 2011-11-04: 40 meq via ORAL
  Filled 2011-11-04: qty 2

## 2011-11-04 MED ORDER — VITAMIN D (ERGOCALCIFEROL) 1.25 MG (50000 UNIT) PO CAPS
50000.0000 [IU] | ORAL_CAPSULE | ORAL | Status: DC
  Administered 2011-11-05 – 2011-11-12 (×2): 50000 [IU] via ORAL
  Filled 2011-11-04 (×2): qty 1

## 2011-11-04 MED ORDER — GABAPENTIN 300 MG PO CAPS
300.0000 mg | ORAL_CAPSULE | Freq: Three times a day (TID) | ORAL | Status: DC
  Administered 2011-11-04 – 2011-11-14 (×28): 300 mg via ORAL
  Filled 2011-11-04 (×32): qty 1

## 2011-11-04 MED ORDER — IRBESARTAN 150 MG PO TABS
150.0000 mg | ORAL_TABLET | Freq: Every day | ORAL | Status: DC
  Administered 2011-11-04 – 2011-11-14 (×10): 150 mg via ORAL
  Filled 2011-11-04 (×11): qty 1

## 2011-11-04 MED ORDER — MAGNESIUM 250 MG PO TABS: 500.0000 mg | ORAL_TABLET | Freq: Every day | ORAL | Status: DC

## 2011-11-04 MED ORDER — AMLODIPINE BESYLATE 5 MG PO TABS
5.0000 mg | ORAL_TABLET | Freq: Every day | ORAL | Status: DC
  Administered 2011-11-04 – 2011-11-14 (×10): 5 mg via ORAL
  Filled 2011-11-04 (×11): qty 1

## 2011-11-04 MED ORDER — ASPIRIN 81 MG PO CHEW
324.0000 mg | CHEWABLE_TABLET | Freq: Once | ORAL | Status: DC
  Filled 2011-11-04: qty 4

## 2011-11-04 MED ORDER — PRENATAL MULTIVITAMIN CH
1.0000 | ORAL_TABLET | Freq: Every day | ORAL | Status: DC
  Administered 2011-11-04 – 2011-11-13 (×9): 1 via ORAL
  Filled 2011-11-04 (×11): qty 1

## 2011-11-04 MED ORDER — COLCHICINE 0.6 MG PO TABS
0.6000 mg | ORAL_TABLET | Freq: Every day | ORAL | Status: DC
  Administered 2011-11-04 – 2011-11-14 (×10): 0.6 mg via ORAL
  Filled 2011-11-04 (×11): qty 1

## 2011-11-04 MED ORDER — PRENATAL VITAMINS 0.8 MG PO TABS: 1.0000 | ORAL_TABLET | Freq: Every day | ORAL | Status: DC

## 2011-11-04 MED ORDER — SODIUM CHLORIDE 0.9 % IJ SOLN: 3.0000 mL | INTRAMUSCULAR | Status: DC | PRN

## 2011-11-04 MED ORDER — NEBIVOLOL HCL 5 MG PO TABS
5.0000 mg | ORAL_TABLET | Freq: Every day | ORAL | Status: DC
  Filled 2011-11-04: qty 1

## 2011-11-04 MED ORDER — ACETAMINOPHEN 325 MG PO TABS: 650.0000 mg | ORAL_TABLET | ORAL | Status: DC | PRN

## 2011-11-04 MED ORDER — INSULIN ASPART 100 UNIT/ML ~~LOC~~ SOLN: 30.0000 [IU] | Freq: Every day | SUBCUTANEOUS | Status: DC

## 2011-11-04 MED ORDER — HEPARIN (PORCINE) IN NACL 100-0.45 UNIT/ML-% IJ SOLN
850.0000 [IU]/h | INTRAMUSCULAR | Status: DC
  Filled 2011-11-04 (×2): qty 250

## 2011-11-04 MED ORDER — TRAMADOL HCL 50 MG PO TABS
50.0000 mg | ORAL_TABLET | Freq: Four times a day (QID) | ORAL | Status: DC | PRN
  Filled 2011-11-04: qty 1

## 2011-11-04 MED ORDER — VITAMIN D3 1.25 MG (50000 UT) PO CAPS: 50000.0000 [IU] | ORAL_CAPSULE | ORAL | Status: DC

## 2011-11-04 MED ORDER — NITROGLYCERIN 0.4 MG SL SUBL: 0.4000 mg | SUBLINGUAL_TABLET | SUBLINGUAL | Status: DC | PRN

## 2011-11-04 MED ORDER — INSULIN ASPART 100 UNIT/ML ~~LOC~~ SOLN
2.0000 [IU] | Freq: Three times a day (TID) | SUBCUTANEOUS | Status: DC
  Administered 2011-11-05: 6 [IU] via SUBCUTANEOUS

## 2011-11-04 MED ORDER — MAGNESIUM OXIDE 400 (241.3 MG) MG PO TABS
400.0000 mg | ORAL_TABLET | Freq: Every day | ORAL | Status: DC
  Administered 2011-11-04 – 2011-11-13 (×9): 400 mg via ORAL
  Filled 2011-11-04 (×11): qty 1

## 2011-11-04 MED ORDER — OMEGA-3-ACID ETHYL ESTERS 1 G PO CAPS
1.0000 g | ORAL_CAPSULE | Freq: Every day | ORAL | Status: DC
  Administered 2011-11-04 – 2011-11-13 (×9): 1 g via ORAL
  Filled 2011-11-04 (×11): qty 1

## 2011-11-04 MED ORDER — INSULIN ASPART 100 UNIT/ML ~~LOC~~ SOLN
6.0000 [IU] | Freq: Three times a day (TID) | SUBCUTANEOUS | Status: DC
  Administered 2011-11-04 – 2011-11-05 (×2): 6 [IU] via SUBCUTANEOUS

## 2011-11-04 MED ORDER — SODIUM CHLORIDE 0.9 % IV SOLN
250.0000 mL | INTRAVENOUS | Status: DC | PRN
  Administered 2011-11-04: 10 mL via INTRAVENOUS

## 2011-11-04 MED ORDER — SODIUM CHLORIDE 0.9 % IJ SOLN
3.0000 mL | Freq: Two times a day (BID) | INTRAMUSCULAR | Status: DC
  Administered 2011-11-04 – 2011-11-14 (×14): 3 mL via INTRAVENOUS

## 2011-11-04 MED ORDER — METOPROLOL TARTRATE 12.5 MG HALF TABLET
12.5000 mg | ORAL_TABLET | Freq: Three times a day (TID) | ORAL | Status: DC
  Administered 2011-11-04 – 2011-11-07 (×8): 12.5 mg via ORAL
  Filled 2011-11-04 (×11): qty 1

## 2011-11-04 MED ORDER — ZOLPIDEM TARTRATE 5 MG PO TABS: 5.0000 mg | ORAL_TABLET | Freq: Every evening | ORAL | Status: DC | PRN

## 2011-11-04 MED ORDER — INSULIN GLARGINE 100 UNIT/ML ~~LOC~~ SOLN
20.0000 [IU] | Freq: Two times a day (BID) | SUBCUTANEOUS | Status: DC
  Administered 2011-11-04: 20 [IU] via SUBCUTANEOUS

## 2011-11-04 MED ORDER — ACETAMINOPHEN 325 MG PO TABS
325.0000 mg | ORAL_TABLET | Freq: Four times a day (QID) | ORAL | Status: DC | PRN
  Administered 2011-11-04 – 2011-11-07 (×2): 325 mg via ORAL
  Filled 2011-11-04 (×2): qty 1

## 2011-11-04 NOTE — ED Notes (Signed)
EKG completed and given to MD. CBG also completed. Test read 174. RN made aware. NAD noted at this time.

## 2011-11-04 NOTE — ED Notes (Signed)
Pt refused to take ASA because her cardiologist told her not to take ASA due to coumadin.  Explained risks/benefits of taking ASA to pt.  Pt verbalized understanding and still refused.  edp notified.

## 2011-11-04 NOTE — ED Provider Notes (Signed)
History  This chart was scribed for Laray Anger, DO by Bennett Scrape. This patient was seen in room APA06/APA06.  CSN: [id number]  Arrival date & time 11/04/11  4098   First MD Initiated Contact with Patient 11/04/11 [phone]      Chief Complaint  Patient presents with  . Chest Pain    The history is provided by the patient. No language interpreter was used.  Pt was seen at 0846. [name] is a 78 y.o. female brought in by ambulance, who presents to the Emergency Department complaining of gradual onset and improvement of constant mid-sternal chest "pain" that began approx 0600 PTA.  Pt describes the pain as "dull," located in her substernal area and radiated into her mid-anterior neck and right shoulder.  Also describes the pain as "like my last heart attack."  Has been associated with nausea, SOB, and mild diaphoresis.  States her symptoms began while she was sitting in her recliner after performing her morning ADL's.  Endorses using 2 sprays of her own nitroglycerin with no improvement in her symptoms.  EMS gave pt SL nitro and pt reports complete resolution of her symptoms. She reports having multiple similar intermittent episodes the past several weeks which improved with nitroglycerin. She was seen by her Cardiologist last week but did not inform him that she had been experiencing these symptoms "because he would have made me go to the hospital."  She denies fever, no cough, no abdominal pain, no vomiting/diarrhea, no back pain.  She has a significant hx of CAD, s/p coronary artery stent placement in 2009 and remote CABG.    PCP is Dr. [name] Cardiology: [place] Dr. Tedra Senegal  Past Medical History  Diagnosis Date  . Other chronic pulmonary heart diseases   . HTN (hypertension)   . Coronary atherosclerosis of unspecified type of vessel, native or graft     remote CABG in 1999 with Dr. Laneta Simmers, S/P PCI with stent to LCX in 2009  . Personal history of other diseases of digestive  system   . Gout, unspecified   . Type I (juvenile type) diabetes mellitus without mention of complication, not stated as uncontrolled   . Unspecified hereditary and idiopathic peripheral neuropathy   . Esophageal reflux   . S/P coronary artery stent placement 06/21/07    Platinum protocol study. Patent at follow up  . Anemia   . GI bleed   . Primary pancreatic carcinoid tumor   . 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 Leukocytosis 03/16/2011  . Hyperlipidemia   . CLL (chronic lymphocytic leukemia)     seeing Dr. [name]  . Pancreas disorder     prior pancreatectomy in 2011 complicated by leak requiring prolong and repeated drainage and subsequent pancreatic stent    Past Surgical History  Procedure Date  . Cabg     Prior CABG in 1999 with LIMA to LAD, SVG to the DX  . Appendectomy   . Oophorectomy     w hysterectomy and bladder tacking procedure  . Tonsillectomy   . Belpharoptosis repair     right upper eyelid  . Esophagogastroduodenoscopy 07/11/2010  . Colonoscopy 10/20/06  . Coronary stent placement 2009    LCX  . Abdominal hysterectomy     Family History  Problem Relation Age of Onset  . Stroke Mother   . Lung cancer Father   . Healthy Son     History  Substance Use Topics  . Smoking status: Never Smoker   .  Smokeless tobacco: Never Used  . Alcohol Use: No    No OB history provided.  Review of Systems ROS: Statement: All systems negative except as marked or noted in the HPI; Constitutional: Negative for fever and chills. ; ; Eyes: Negative for eye pain, redness and discharge. ; ; ENMT: Negative for ear pain, hoarseness, nasal congestion, sinus pressure and sore throat. ; ; Cardiovascular: +CP, SOB, diaphoresis. Negative for chest pain, palpitations, and peripheral edema. ; ; Respiratory: Negative for cough, wheezing and stridor. ; ; Gastrointestinal: +nausea. Negative for vomiting, diarrhea, abdominal pain, blood in stool, hematemesis,  jaundice and rectal bleeding. . ; ; Genitourinary: Negative for dysuria, flank pain and hematuria. ; ; Musculoskeletal: Negative for back pain and neck pain. Negative for swelling and trauma.; ; Skin: Negative for pruritus, rash, abrasions, blisters, bruising and skin lesion.; ; Neuro: Negative for headache, lightheadedness and neck stiffness. Negative for weakness, altered level of consciousness , altered mental status, extremity weakness, paresthesias, involuntary movement, seizure and syncope.      Allergies  Ivp dye; Codeine; Morphine; and Statins  Home Medications   Current Outpatient Rx  Name Route Sig Dispense Refill  . ACETAMINOPHEN 325 MG PO TABS Oral Take 325 mg by mouth as needed.      . ALLOPURINOL 300 MG PO TABS  as needed.     Marland Kitchen AMLODIPINE BESYLATE 10 MG PO TABS Oral Take 5 mg by mouth daily. PT TAKES ONLY WHEN SHE THINGS SHE NEEDS IT,    . BYSTOLIC 5 MG PO TABS  daily.     Marland Kitchen VITAMIN D3 50000 UNITS PO CAPS Oral Take by mouth. Once a week     . COLCRYS 0.6 MG PO TABS  2 (two) times daily.     . ERGOCALCIFEROL 50000 UNITS PO CAPS Oral Take 50,000 Units by mouth.    . FEBUXOSTAT 40 MG PO TABS Oral Take 40 mg by mouth daily.    Marland Kitchen GABAPENTIN 300 MG PO CAPS Oral Take 300 mg by mouth 3 (three) times daily.     . IBUPROFEN 600 MG PO TABS Oral Take 600 mg by mouth every 8 (eight) hours as needed.    . INSULIN ASPART 100 UNIT/ML [place] SOLN Subcutaneous Inject into the skin. In the insulin pump. Usually 30-50units daily.    Marland Kitchen LOPERAMIDE HCL 2 MG PO CAPS Oral Take 2 mg by mouth as needed.      Marland Kitchen MAGNESIUM 250 MG PO TABS Oral Take 500 tablets by mouth daily.     Marland Kitchen METOPROLOL TARTRATE 25 MG PO TABS Oral Take 1 tablet (25 mg total) by mouth 2 (two) times daily. 60 tablet 3  . NITROGLYCERIN 0.4 MG/SPRAY TL SOLN Sublingual Place 1 spray under the tongue every 5 (five) minutes as needed.     Marland Kitchen OLMESARTAN MEDOXOMIL 40 MG PO TABS Oral Take 40 mg by mouth daily.      Marland Kitchen FISH OIL 1000 MG PO CAPS Oral  Take by mouth daily.      Marland Kitchen POLYETHYLENE GLYCOL 3350 PO PACK Oral Take 17 g by mouth as needed.    Marland Kitchen PRENATAL VITAMINS 0.8 MG PO TABS Oral Take 1 tablet by mouth daily.      Marland Kitchen PRENATAL VITAMIN PO Oral Take by mouth every morning.    Marland Kitchen PROMETHAZINE HCL 12.5 MG PO TABS Oral Take 12.5 mg by mouth as needed.      Marland Kitchen TRAMADOL HCL 50 MG PO TABS      .  WARFARIN SODIUM 2.5 MG PO TABS Oral Take 2.5 mg by mouth daily. Monday, Wednesday,Friday add a 1/2 tablet to regiment 1 tablet 0    Ht 5\' 4"  (1.626 m)  Wt 165 lb (74.844 kg)  BMI 28.32 kg/m2  Physical Exam 0850: Physical examination:  Nursing notes reviewed; Vital signs and O2 SAT reviewed;  Constitutional: Well developed, Well nourished, Well hydrated, In no acute distress; Head:  Normocephalic, atraumatic; Eyes: EOMI, PERRL, No scleral icterus; ENMT: Mouth and pharynx normal, Mucous membranes moist; Neck: Supple, Full range of motion, No lymphadenopathy; Cardiovascular: Irregular irregular rate and rhythm, No  gallop; Respiratory: Breath sounds clear & equal bilaterally, No wheezes.  Speaking full sentences with ease, Normal respiratory effort/excursion; Chest: Nontender, Movement normal; Abdomen: Soft, Nontender, Nondistended, Normal bowel sounds;; Extremities: Pulses normal, No tenderness, No edema, No calf edema or asymmetry.; Neuro: AA&Ox3, Major CN grossly intact.  Speech clear. No gross focal motor or sensory deficits in extremities.; Skin: Color normal, Warm, Dry.   ED Course  Procedures   [phone]:  Pt refuses to take ASA "because I'm on coumadin and Dr. Riley Kill said not to."  Keeps repeating she is "fine" and "wants to go home now."  Convinced by myself and ED RN to stay for workup and results but still refuses ASA despite explaining rationale by myself and ED RN.   1000:  Pt continues to deny any symptoms since arrival to the ED.  Repetitively requests to leave, though family at the bedside now has convinced her stay.  Still refuses ASA.  INR  therapeutic.  EKG is without acute ST elevations.  Dx testing d/w pt and family.  Questions answered.  Verb understanding, agreeable to transfer/admit to [facility]  T/C to [employer] [facility] at [facility] Dr. Johney Frame, case discussed, including:  HPI, pertinent PM/SHx, VS/PE, dx testing, ED course and treatment:  Agreeable to accept transfer/admit, requests to obtain stepdown bed.   MDM  MDM Reviewed: previous chart, nursing note and vitals Reviewed previous: ECG Interpretation: ECG, labs and x-ray    Date: 11/04/2011  Rate: 99  Rhythm: atrial fibrillation  QRS Axis: normal  Intervals: normal  ST/T Wave abnormalities: nonspecific ST/T changes, ST depressions leads II, III, aVF, V4-V6.  Conduction Disutrbances:none  Narrative Interpretation:   Old EKG Reviewed: changes noted, new ST changes from previous EKG dated 02/26/2010.  Results for orders placed during the hospital encounter of 11/04/11  CBC WITH DIFFERENTIAL      Component Value Range   WBC 14.0 (*) 4.0 - 10.5 K/uL   RBC 4.34  3.87 - 5.11 MIL/uL   Hemoglobin 13.9  12.0 - 15.0 g/dL   HCT 96.0  45.4 - 09.8 %   MCV 94.7  78.0 - 100.0 fL   MCH 32.0  26.0 - 34.0 pg   MCHC 33.8  30.0 - 36.0 g/dL   RDW 11.9  14.7 - 82.9 %   Platelets 333  150 - 400 K/uL   Neutrophils Relative 46  43 - 77 %   Neutro Abs 6.4  1.7 - 7.7 K/uL   Lymphocytes Relative 40  12 - 46 %   Lymphs Abs 5.6 (*) 0.7 - 4.0 K/uL   Monocytes Relative 10  3 - 12 %   Monocytes Absolute 1.4 (*) 0.1 - 1.0 K/uL   Eosinophils Relative 3  0 - 5 %   Eosinophils Absolute 0.5  0.0 - 0.7 K/uL   Basophils Relative 1  0 - 1 %   Basophils Absolute 0.1  0.0 - 0.1 K/uL  BASIC METABOLIC PANEL      Component Value Range   Sodium 135  135 - 145 mEq/L   Potassium 3.4 (*) 3.5 - 5.1 mEq/L   Chloride 97  96 - 112 mEq/L   CO2 28  19 - 32 mEq/L   Glucose, Bld 182 (*) 70 - 99 mg/dL   BUN 21  6 - 23 mg/dL   Creatinine, Ser 4.09  0.50 - 1.10 mg/dL   Calcium 81.1  8.4 - 91.4 mg/dL   GFR calc non Af  Amer 82 (*) >90 mL/min   GFR calc Af Amer >90  >90 mL/min  TROPONIN I      Component Value Range   Troponin I 3.40 (*) <0.30 ng/mL  GLUCOSE, CAPILLARY      Component Value Range   Glucose-Capillary 173 (*) 70 - 99 mg/dL   Comment 1 Documented in Chart     Comment 2 Notify RN    PROTIME-INR      Component Value Range   Prothrombin Time 27.9 (*) 11.6 - 15.2 seconds   INR 2.56 (*) 0.00 - 1.49   Dg Chest Portable 1 View 11/04/2011  *RADIOLOGY REPORT*  Clinical Data: Chest pain.  Pancreatic neoplasm.  PORTABLE CHEST - 1 VIEW  Comparison: 11/15/2011most recent.  Multiple priors.  Findings: Cardiomegaly.  Prior CABG.  No infiltrates or failure. No pulmonary nodules are seen.  Bones are demineralized.  Similar appearance to priors.  IMPRESSION: Cardiomegaly, no active disease.  Original Report Authenticated By: Elsie Stain, M.D.           I personally performed the services described in this documentation, which was scribed in my presence. The recorded information has been reviewed and considered. Devonna Oboyle Allison Quarry, DO 11/04/11 1922

## 2011-11-04 NOTE — H&P (Signed)
History and Physical  Patient ID: [name] Patient ID: [name] MRN: [id number], DOB/AGE: 1929/06/23 78 y.o. Date of Encounter: 11/04/2011  Primary Physician: Dwana Melena, MD Primary Cardiologist: Dr. Riley Kill Chief Complaint: Chest pain  WUJ:WJXB is an 65 old female with CAD and persistent afib.  She has had multiple follow -up visits to the clinic regarding her afib and rate was controlled on bystolic.  She was started on warfarin on 7/4 after being cleared by her GI and oncologist and have been following up INR at our office.   This morning around 6am, soon after washing up and sitting in her recliner, she began to feel pain in her chest in the mid sternal area which radiated to her neck. She describes it as "pain in my chest" 10/10 similar to my first heart attack. She took her NTG spray (which according to ER records had expired) but pain was unrelieved so she called her care giver and EMS was summoned. EMS gave her NTG SL which relieved the pain. She thinks that her chest pain duration was 30 minutes. Pain was associated with mild nausea, diaphoresis and mild hand tremors.Since admission, she has been chest pain free. Complains of mild SOB and dyspnea on exertion chronically.Denies nausea headaches or palpitations.  She denies any other episodes of similar chest pain recently.   Past Medical History  Diagnosis Date  . Other chronic pulmonary heart diseases   . HTN (hypertension)   . Coronary atherosclerosis of unspecified type of vessel, native or graft     remote CABG in 1999 with Dr. Laneta Simmers, S/P PCI with stent to LCX in 2009  . Personal history of other diseases of digestive system   . Gout, unspecified   . Type I (juvenile type) diabetes mellitus without mention of complication, not stated as uncontrolled   . Unspecified hereditary and idiopathic peripheral neuropathy   . Esophageal reflux   . S/P coronary artery stent placement 06/21/07    Platinum protocol study. Patent at  follow up  . Anemia   . GI bleed   . Primary pancreatic carcinoid tumor   . 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 Leukocytosis 03/16/2011  . Hyperlipidemia   . CLL (chronic lymphocytic leukemia)     seeing Dr. [name]  . Pancreas disorder     prior pancreatectomy in 2011 complicated by leak requiring prolong and repeated drainage and subsequent pancreatic stent     Surgical History:  Past Surgical History  Procedure Date  . Cabg     Prior CABG in 1999 with LIMA to LAD, SVG to the DX  . Appendectomy   . Oophorectomy     w hysterectomy and bladder tacking procedure  . Tonsillectomy   . Belpharoptosis repair     right upper eyelid  . Esophagogastroduodenoscopy 07/11/2010  . Colonoscopy 10/20/06  . Coronary stent placement 2009    LCX  . Abdominal hysterectomy      I have reviewed the patient's current medications. Prior to Admission medications   Medication Sig Start Date End Date Taking? Authorizing Provider  acetaminophen (TYLENOL) 325 MG tablet Take 325 mg by mouth as needed.     Yes Historical Provider, MD  amLODipine (NORVASC) 10 MG tablet Take 5 mg by mouth daily. PT TAKES ONLY WHEN SHE THINKS SHE NEEDS IT   Yes Historical Provider, MD  BYSTOLIC 5 MG tablet Take 5 mg by mouth daily.  10/30/11  Yes Historical Provider, MD  COLCRYS 0.6  MG tablet Take 0.6 mg by mouth daily. Dr Catalina Pizza instructed pt. To stop this med the end of this week and take Uloric 07/22/11  Yes Historical Provider, MD  febuxostat (ULORIC) 40 MG tablet Take 40 mg by mouth daily.   Yes Historical Provider, MD  gabapentin (NEURONTIN) 300 MG capsule Take 300 mg by mouth 3 (three) times daily.    Yes Historical Provider, MD  insulin aspart (NOVOLOG) 100 UNIT/ML injection Inject into the skin. In the insulin pump. Usually 30-50units daily.   Yes Historical Provider, MD  loperamide (IMODIUM) 2 MG capsule Take 2 mg by mouth as needed.     Yes Historical Provider, MD  Magnesium 250 MG  TABS Take 500 mg by mouth daily.    Yes Historical Provider, MD  metoprolol tartrate (LOPRESSOR) 25 MG tablet Take 1 tablet (25 mg total) by mouth 2 (two) times daily. 10/13/11 10/12/12 Yes Herby Abraham, MD  nitroGLYCERIN (NITROLINGUAL) 0.4 MG/SPRAY spray Place 1 spray under the tongue every 5 (five) minutes as needed.    Yes Historical Provider, MD  olmesartan (BENICAR) 40 MG tablet Take 40 mg by mouth every morning.    Yes Historical Provider, MD  Omega-3 Fatty Acids (FISH OIL) 1000 MG CAPS Take by mouth daily.     Yes Historical Provider, MD  polyethylene glycol (MIRALAX / GLYCOLAX) packet Take 17 g by mouth as needed.   Yes Historical Provider, MD  Prenatal Multivit-Min-Fe-FA (PRENATAL VITAMINS) 0.8 MG tablet Take 1 tablet by mouth daily.    Yes Historical Provider, MD  promethazine (PHENERGAN) 12.5 MG tablet Take 12.5 mg by mouth as needed.     Yes Historical Provider, MD  traMADol (ULTRAM) 50 MG tablet Take 50 mg by mouth daily as needed. pain 10/28/11  Yes Historical Provider, MD  warfarin (COUMADIN) 2.5 MG tablet Take 2.5-4 mg by mouth daily. TAKES 2.5 MG ON Sunday, Tuesday, Thursday and Saturday. Takes 4 mg on Monday, Wednesday and Fridays 10/14/11  Yes Herby Abraham, MD  Cholecalciferol (VITAMIN D3) 50000 UNITS CAPS Take 50,000 Units by mouth every 7 (seven) days. TAKES ON THURSDAYS    Historical Provider, MD    Scheduled Meds:   . aspirin  324 mg Oral Once   Continuous Infusions:  PRN Meds:.  Allergies:  Allergies  Allergen Reactions  . Ivp Dye (Iodinated Diagnostic Agents)   . Codeine   . Morphine   . Statins     History   Social History  . Marital Status: Widowed    Spouse Name: N/A    Number of Children: N/A  . Years of Education: N/A   Occupational History  . Not on file.   Social History Main Topics  . Smoking status: Never Smoker   . Smokeless tobacco: Never Used  . Alcohol Use: No  . Drug Use: No  . Sexually Active: No   Other Topics Concern  . Not  on file   Social History Narrative   Worked at [employer] for 41 years. Has a son who lives in [place].      Family History  Problem Relation Age of Onset  . Stroke Mother   . Lung cancer Father   . Healthy Son     Review of Systems: Pt has frequent nausea.  Pt was on 2 BBs at one point, Bystolic d/c'd and Toprol continued.  Pt had problems with dizziness on Toprol XL and so will go back to Bystolic. No fevers or chills. No melena  or GIB. Tolerating coumadin well. She is weak. Full 14-point review of systems otherwise negative except as noted above.  Physical Exam:  Blood pressure 176/85, pulse 80, temperature 97.7 F (36.5 C), temperature source Oral, resp. rate 18, height 5\' 4"  (1.626 m), weight 165 lb (74.844 kg), SpO2 96.00%. General: Well developed, well nourished, female in no acute distress. Head: Normocephalic, atraumatic, sclera non-icteric, no xanthomas, nares are without discharge. Dentition: poor Neck: No carotid bruits. JVD not elevated.  Lungs: Good expansion bilaterally. BLL diminished without wheezes or rhonchi; rales in bases.  Heart: IRRegular rate and rhythm with S1 S2.  No S3 or S4.  No murmur, no rubs, or gallops appreciated. Abdomen: Soft, non-tender, obese with normoactive bowel sounds. No . No rebound/guarding.  Msk:  Strength and tone appear normal for age. No joint deformities or effusions, no spine or costo-vertebral angle tenderness. Extremities: No clubbing or cyanosis. No edema.  Distal pedal pulses are 2+ in 4 extrem Neuro: Alert and oriented X 3. Moves all extremities spontaneously. No focal deficits noted. Psych:  Responds to questions appropriately with a normal affect. Skin: No rashes or lesions noted  Labs:   Lab Results  Component Value Date   WBC 14.0* 11/04/2011   HGB 13.9 11/04/2011   HCT 41.1 11/04/2011   MCV 94.7 11/04/2011   PLT 333 11/04/2011    Basename 11/04/11 0855  INR 2.56*    Lab 11/04/11 0855  NA 135  K 3.4*  CL 97  CO2 28    BUN 21  CREATININE 0.64  CALCIUM 10.4  PROT --  BILITOT --  ALKPHOS --  ALT --  AST --  GLUCOSE 182*    Basename 11/04/11 0855  CKTOTAL --  CKMB --  TROPONINI 3.40*    Radiology/Studies:  Dg Chest Portable 1 View 11/04/2011  *RADIOLOGY REPORT*  Clinical Data: Chest pain.  Pancreatic neoplasm.  PORTABLE CHEST - 1 VIEW  Comparison: 11/15/2011most recent.  Multiple priors.  Findings: Cardiomegaly.  Prior CABG.  No infiltrates or failure. No pulmonary nodules are seen.  Bones are demineralized.  Similar appearance to priors.  IMPRESSION: Cardiomegaly, no active disease.  Original Report Authenticated By: Elsie Stain, M.D.     Cardiac Cath: 06/21/2007 ANGIOGRAPHIC DATA:  1. Ventriculography in the RAO projection reveals an ejection fraction  probably 55%. There is some anteroapical minimal hypokinesis.  2. The left main is free of critical disease.  3. The LAD is totally occluded after a diagonal and diffusely diseased  proximally.  4. The saphenous vein graft to the diagonal is widely patent and  smooth. It retrograde fills the mid LAD which has several lesions.  5. The internal mammary to the distal LAD appears to be intact with  vigorous flow down the vessel.  6. The circumflex comes off the left main and provides predominantly  one marg branch that is tortuous. There were several sub branches  as well. There is a 90% focal stenosis measuring about 10 mm in  length. Following dilatation with an 18 mm stent and post  dilatation this was reduced to zero percent residual luminal  narrowing. There was no evidence of edge tear.  7. The right coronary artery was a very large caliber vessel with  minimal luminal irregularity. There is perhaps 20% narrowing at  the proximal PDA. Percutaneous stenting of the circumflex coronary artery involving  the platinum protocol. 18 x 2.5 platinum study stent   Echo: Study Conclusions 10/13/11 - Left ventricle: The cavity  size was normal.  Wall thickness was increased in a pattern of mild LVH. Systolic function was normal. The estimated ejection fraction was in the range of 60% to 65%. Wall motion was normal; there were no regional wall motion abnormalities. - Mitral valve: Mild regurgitation. - Tricuspid valve: Moderate regurgitation. - Pulmonary arteries: Systolic pressure was mildly increased. PA peak pressure: 33mm Hg (S).  ECG: Afib 04-Nov-2011 08:29:14 Atrial fibrillation ST & T wave abnormality, consider inferolateral ischemia Abnormal ECG When compared with ECG of 26-Feb-2010 01:44, Atrial fibrillation has replaced Sinus rhythm ST now depressed in Inferior leads T wave inversion more evident in Inferior leads T wave inversion now evident in Anterolateral leads Vent. rate 99 BPM PR interval * ms QRS duration 84 ms QT/QTc 310/397 ms P-R-T axes * 85 266  ASSESSMENT AND PLAN:  1.NSTEMI/CAD s/p remote CABG, S/P PCI with stent to LCX in 2009. She developed chest pain this morning which was relieved with NTG sl. She describes pain as same as her previous MI. She is currently pain free and has refused her ASA. First trop positive.Will initiate heparin once INR < 2.0, cycle cardiac biomarkers, if continue to be elevated, will plan CATH when INR sub-therapeutic enough. Her coumadin is held.   2. HTN- was elevated but now improved.Will continue her home meds.  3. Atrial fibrillation: This is chronic in nature. Rate reasonably controlled.  Pt had problems with dizziness on Toprol XL and but needs rate control. Will try lower dose of Lopressor, TID. Restart coumadin per pharmacy once invasive workup completed.  4. Leucocytosis- this is not new, pt has CLL followed by oncologist.  5. Hypokalemia- replace K, follow.  6. Chronic anticoagulation - Pt is on coumadin - Hold till invasive workup completed. Add ASA 81 mg and Dr Riley Kill to decide final anticoagulation regimen.  7. DM - Home insulin regimen is complicated and  equipment not available to continue it in-hospital. Dr Lucianne Muss contacted and will consult on pt to manage DM. Will hold off on ordering SSI, insulin pump till he sees. She has not eaten today and her CBG is low, treat with PO intake and follow closely.  Signed,  Bjorn Loser Barrett PA-C 11/04/2011, 2:01 PM  I have seen, examined the patient, and reviewed the above assessment and plan.  Changes to above are made where necessary.  The patient presents with an episode of CP this am, now resolved.  She denies other recent episodes of angina.  Her troponin at [facility] was quite elevated, though CK and MB were not drawn.  At this point, we will observe closely in the TCU.  We will cycle CMs overnight and ask Dr Riley Kill to assist with further risk stratification decisions in the am.  Presently, she feels much better and hopes to go home soon. If CMs are significantly abnormal, she may need cath.  This may require reversal of anticoagulation.  IF CMs do not significant elevate and she has no further symptoms, then perhaps a more conservative approach would be reasonable.   Co Sign: Hillis Range, MD 11/04/2011 5:25 PM

## 2011-11-04 NOTE — Progress Notes (Addendum)
Inpatient Diabetes Program Recommendations  AACE/ADA: New Consensus Statement on Inpatient Glycemic Control (2009)  Target Ranges:  Prepandial:   less than 140 mg/dL      Peak postprandial:   less than 180 mg/dL (1-2 hours)      Critically ill patients:  140 - 180 mg/dL   Reason for Visit: Patient admitted with insulin pump.  See's Dr. Lucianne Muss.  States that her friend takes care of pump at home and he will be going home tonight.  She is due for set change today and is almost out of insulin (70.6 units left).  She has no supplies here at hospital.  Called Dr. Lucianne Muss regarding recs. About insulin while in hospital.  He states he will come and see patient to manage diabetes/insulin.  Discussed with PA.  It does not appear that patient is appropriate to be on insulin pump since her friend manages at home (and will not be with her 40 /7).  Current CBG 1540 is 70 mg/dL.  RN gave patient regular soda.   Will follow.  Recommend checking CBG's closely (hourly) to prevent hypoglycemia.

## 2011-11-04 NOTE — ED Notes (Signed)
EMS reports called out for chest pain that started around 6am.  Reports pain radiated to r shoulder and neck.   Pt used 2 nitro sprays at home without relief but nitro had expired.  EMS administered one SL nitro and cp was relieved.  Pt afib on monitor.

## 2011-11-04 NOTE — Consult Note (Signed)
Reason for Consult:Diabetes Management Referring Physician: Dr. Lossie Faes [name] is an 78 y.o. female.  HPI: the patient has long-standing diabetes, type II with insulin resistance and requiring large amounts of insulin for control.  She has been on an insulin pump for several years with recently fairly good control.  Her insulin pump settings are as follows: BASAL rates: Basal rate: midnight=1.25. 6 am=2.6. 9 a.m. = 3.0. 1pm= 3.9, 5 p.m. = 4.1; 6 p.m. = 4.5, and 10.30 p.m. = 1.7 Carb ratio: 1:6 correction factor: 1:15; target 120 -140. active insulin time = 4 hours. Average daily insulin 92 units.  Her hemoglobin A1c has been lower than expected from her home readings, was 7% in April.  Her blood sugars tend to be higher at that time which were averaging 186 on the last visit. During the hospital stay her blood sugar was initially 173 and about an hour ago was 70 with continuing her basal rate. She has not had any meals today and only a snack. Her pump is usually managed by her neighbor who against ordering today and she usually not comfortable managing the pump for changing the settings and not completely  sure how to figure out the boluses.    Past Medical History  Diagnosis Date  . Other chronic pulmonary heart diseases   . HTN (hypertension)   . Coronary atherosclerosis of unspecified type of vessel, native or graft     remote CABG in 1999 with Dr. Laneta Simmers, S/P PCI with stent to LCX in 2009  . Personal history of other diseases of digestive system   . Gout, unspecified   . Type I (juvenile type) diabetes mellitus without mention of complication, not stated as uncontrolled   . Unspecified hereditary and idiopathic peripheral neuropathy   . Esophageal reflux   . S/P coronary artery stent placement 06/21/07    Platinum protocol study. Patent at follow up  . Anemia   . GI bleed   . Primary pancreatic carcinoid tumor   . 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  Leukocytosis 03/16/2011  . Hyperlipidemia   . CLL (chronic lymphocytic leukemia)     seeing Dr. [name]  . Pancreas disorder     prior pancreatectomy in 2011 complicated by leak requiring prolong and repeated drainage and subsequent pancreatic stent    Past Surgical History  Procedure Date  . Cabg     Prior CABG in 1999 with LIMA to LAD, SVG to the DX  . Appendectomy   . Oophorectomy     w hysterectomy and bladder tacking procedure  . Tonsillectomy   . Belpharoptosis repair     right upper eyelid  . Esophagogastroduodenoscopy 07/11/2010  . Colonoscopy 10/20/06  . Coronary stent placement 2009    LCX  . Abdominal hysterectomy     Family History  Problem Relation Age of Onset  . Stroke Mother   . Lung cancer Father   . Healthy Son     Social History:  reports that she has never smoked. She has never used smokeless tobacco. She reports that she does not drink alcohol or use illicit drugs.  Allergies:  Allergies  Allergen Reactions  . Ivp Dye (Iodinated Diagnostic Agents)   . Codeine   . Morphine   . Statins     Medications: I have reviewed the patient's current medications.  Results for orders placed during the hospital encounter of 11/04/11 (from the past 48 hour(s))  GLUCOSE, CAPILLARY  Status: Abnormal   Collection Time   11/04/11  8:38 AM      Component Value Range Comment   Glucose-Capillary 173 (*) 70 - 99 mg/dL    Comment 1 Documented in Chart      Comment 2 Notify RN     CBC WITH DIFFERENTIAL     Status: Abnormal   Collection Time   11/04/11  8:55 AM      Component Value Range Comment   WBC 14.0 (*) 4.0 - 10.5 K/uL    RBC 4.34  3.87 - 5.11 MIL/uL    Hemoglobin 13.9  12.0 - 15.0 g/dL    HCT 16.1  09.6 - 04.5 %    MCV 94.7  78.0 - 100.0 fL    MCH 32.0  26.0 - 34.0 pg    MCHC 33.8  30.0 - 36.0 g/dL    RDW 40.9  81.1 - 91.4 %    Platelets 333  150 - 400 K/uL    Neutrophils Relative 46  43 - 77 %    Neutro Abs 6.4  1.7 - 7.7 K/uL    Lymphocytes  Relative 40  12 - 46 %    Lymphs Abs 5.6 (*) 0.7 - 4.0 K/uL    Monocytes Relative 10  3 - 12 %    Monocytes Absolute 1.4 (*) 0.1 - 1.0 K/uL    Eosinophils Relative 3  0 - 5 %    Eosinophils Absolute 0.5  0.0 - 0.7 K/uL    Basophils Relative 1  0 - 1 %    Basophils Absolute 0.1  0.0 - 0.1 K/uL   BASIC METABOLIC PANEL     Status: Abnormal   Collection Time   11/04/11  8:55 AM      Component Value Range Comment   Sodium 135  135 - 145 mEq/L    Potassium 3.4 (*) 3.5 - 5.1 mEq/L    Chloride 97  96 - 112 mEq/L    CO2 28  19 - 32 mEq/L    Glucose, Bld 182 (*) 70 - 99 mg/dL    BUN 21  6 - 23 mg/dL    Creatinine, Ser 7.82  0.50 - 1.10 mg/dL    Calcium 95.6  8.4 - 10.5 mg/dL    GFR calc non Af Amer 82 (*) >90 mL/min    GFR calc Af Amer >90  >90 mL/min   TROPONIN I     Status: Abnormal   Collection Time   11/04/11  8:55 AM      Component Value Range Comment   Troponin I 3.40 (*) <0.30 ng/mL   PROTIME-INR     Status: Abnormal   Collection Time   11/04/11  8:55 AM      Component Value Range Comment   Prothrombin Time 27.9 (*) 11.6 - 15.2 seconds    INR 2.56 (*) 0.00 - 1.49   MRSA PCR SCREENING     Status: Normal   Collection Time   11/04/11  1:13 PM      Component Value Range Comment   MRSA by PCR NEGATIVE  NEGATIVE   CARDIAC PANEL(CRET KIN+CKTOT+MB+TROPI)     Status: Abnormal   Collection Time   11/04/11  2:49 PM      Component Value Range Comment   Total CK 54  7 - 177 U/L    CK, MB 3.0  0.3 - 4.0 ng/mL    Troponin I 3.38 (*) <0.30 ng/mL    Relative Index  RELATIVE INDEX IS INVALID  0.0 - 2.5   GLUCOSE, CAPILLARY     Status: Normal   Collection Time   11/04/11  3:31 PM      Component Value Range Comment   Glucose-Capillary 70  70 - 99 mg/dL     Dg Chest Portable 1 View  11/04/2011  *RADIOLOGY REPORT*  Clinical Data: Chest pain.  Pancreatic neoplasm.  PORTABLE CHEST - 1 VIEW  Comparison: 11/15/2011most recent.  Multiple priors.  Findings: Cardiomegaly.  Prior CABG.  No infiltrates  or failure. No pulmonary nodules are seen.  Bones are demineralized.  Similar appearance to priors.  IMPRESSION: Cardiomegaly, no active disease.  Original Report Authenticated By: Elsie Stain, M.D.    Review of Systems  Constitutional: Negative for fever.  Cardiovascular: Positive for chest pain. Negative for leg swelling.  Gastrointestinal: Negative for abdominal pain.  Musculoskeletal: Negative for myalgias.  Neurological: Negative for dizziness and headaches.   Blood pressure 153/94, pulse 92, temperature 97.7 F (36.5 C), temperature source Oral, resp. rate 24, height 5\' 4"  (1.626 m), weight 74.844 kg (165 lb), SpO2 94.00%. Physical Exam  Constitutional: She appears well-developed. No distress.  Cardiovascular: Regular rhythm.   Skin: Skin is warm and dry.  Psychiatric: She has a normal mood and affect.    Assessment/Plan:   DIABETES type 2 with insulin resistance and variable control in the past. Currently the patient's blood sugars are fairly well-controlled and since they are relatively lower this afternoon will need adjustment of her pump. HYPERTENSION: The patient has relatively high blood pressure today related to her recent status post MI HYPERLIPIDEMIA: Patient has diffuse to take any medications for her hyperlipidemia which includes high triglycerides and high LDL. Would recommend that she be tried on at least low-dose pravastatin Plan: Since the patient is not very capable of handling her insulin pump on her own will switch her to Lantus and mealtime NovoLog. Also since her blood sugars are relatively low with her and her basal rate down to 2.5 until 9 PM while starting her basal bolus regimen with subcutaneous insulin Will start conservatively at 20 units twice a day off her Lantus along with 6 units at mealtimes along with sliding scale. Blood sugars will be checked frequently. We'll continue to follow her during hospitalization  Shlome Baldree 11/04/2011, 4:15 PM

## 2011-11-04 NOTE — ED Notes (Signed)
CRITICAL VALUE ALERT  Critical value received:  Troponin 3.40  Date of notification:  11/04/2011  Time of notification:  0932  Critical value read back:yes  Nurse who received alert:  Santiago Bur, RN  MD notified (1st page):  Dr. Clarene Duke  Time of first page:  0932  MD notified (2nd page):  Time of second page:  Responding MD:  Dr. Clarene Duke  Time MD responded:  [phone]

## 2011-11-04 NOTE — Progress Notes (Signed)
ANTICOAGULATION CONSULT NOTE - Initial Consult  Pharmacy Consult for heparin Indication: chest pain/ACS  Allergies  Allergen Reactions  . Ivp Dye (Iodinated Diagnostic Agents)   . Codeine   . Morphine   . Statins     Patient Measurements: Height: 5\' 4"  (162.6 cm) Weight: 165 lb (74.844 kg) IBW/kg (Calculated) : 54.7  Heparin Dosing Weight: 70.3  Vital Signs: Temp: 97.7 F (36.5 C) (07/24 1053) Temp src: Oral (07/24 1053) BP: 176/85 mmHg (07/24 1053) Pulse Rate: 80  (07/24 1053)  Labs:  Basename 11/04/11 0855 11/02/11 1450 11/02/11 0917  HGB 13.9 -- 14.0  HCT 41.1 -- 41.2  PLT 333 -- 369  APTT -- -- --  LABPROT 27.9* -- --  INR 2.56* 2.9 --  HEPARINUNFRC -- -- --  CREATININE 0.64 -- --  CKTOTAL -- -- --  CKMB -- -- --  TROPONINI 3.40* -- --    Estimated Creatinine Clearance: 55.5 ml/min (by C-G formula based on Cr of 0.64).   Medical History: Past Medical History  Diagnosis Date  . Other chronic pulmonary heart diseases   . HTN (hypertension)   . Coronary atherosclerosis of unspecified type of vessel, native or graft     remote CABG in 1999 with Dr. Laneta Simmers, S/P PCI with stent to LCX in 2009  . Personal history of other diseases of digestive system   . Gout, unspecified   . Type I (juvenile type) diabetes mellitus without mention of complication, not stated as uncontrolled   . Unspecified hereditary and idiopathic peripheral neuropathy   . Esophageal reflux   . S/P coronary artery stent placement 06/21/07    Platinum protocol study. Patent at follow up  . Anemia   . GI bleed   . Primary pancreatic carcinoid tumor   . 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 Leukocytosis 03/16/2011  . Hyperlipidemia   . CLL (chronic lymphocytic leukemia)     seeing Dr. [name]  . Pancreas disorder     prior pancreatectomy in 2011 complicated by leak requiring prolong and repeated drainage and subsequent pancreatic stent    Medications:    Prescriptions prior to admission  Medication Sig Dispense Refill  . acetaminophen (TYLENOL) 325 MG tablet Take 325 mg by mouth as needed.        Marland Kitchen amLODipine (NORVASC) 10 MG tablet Take 5 mg by mouth daily. PT TAKES ONLY WHEN SHE THINKS SHE NEEDS IT      . BYSTOLIC 5 MG tablet Take 5 mg by mouth daily.       Marland Kitchen COLCRYS 0.6 MG tablet Take 0.6 mg by mouth daily. Dr Catalina Pizza instructed pt. To stop this med the end of this week and take Uloric      . febuxostat (ULORIC) 40 MG tablet Take 40 mg by mouth daily.      Marland Kitchen gabapentin (NEURONTIN) 300 MG capsule Take 300 mg by mouth 3 (three) times daily.       . insulin aspart (NOVOLOG) 100 UNIT/ML injection Inject into the skin. In the insulin pump. Usually 30-50units daily.      Marland Kitchen loperamide (IMODIUM) 2 MG capsule Take 2 mg by mouth as needed.        . Magnesium 250 MG TABS Take 500 mg by mouth daily.       . metoprolol tartrate (LOPRESSOR) 25 MG tablet Take 1 tablet (25 mg total) by mouth 2 (two) times daily.  60 tablet  3  . nitroGLYCERIN (NITROLINGUAL) 0.4 MG/SPRAY  spray Place 1 spray under the tongue every 5 (five) minutes as needed.       Marland Kitchen olmesartan (BENICAR) 40 MG tablet Take 40 mg by mouth every morning.       . Omega-3 Fatty Acids (FISH OIL) 1000 MG CAPS Take by mouth daily.        . polyethylene glycol (MIRALAX / GLYCOLAX) packet Take 17 g by mouth as needed.      . Prenatal Multivit-Min-Fe-FA (PRENATAL VITAMINS) 0.8 MG tablet Take 1 tablet by mouth daily.       . promethazine (PHENERGAN) 12.5 MG tablet Take 12.5 mg by mouth as needed.        . traMADol (ULTRAM) 50 MG tablet Take 50 mg by mouth daily as needed. pain      . warfarin (COUMADIN) 2.5 MG tablet Take 2.5-4 mg by mouth daily. TAKES 2.5 MG ON Sunday, Tuesday, Thursday and Saturday. Takes 4 mg on Monday, Wednesday and Fridays  1 tablet  0  . Cholecalciferol (VITAMIN D3) 50000 UNITS CAPS Take 50,000 Units by mouth every 7 (seven) days. TAKES ON THURSDAYS        Assessment: Ms.  Kellett is a 78 yo f admitted with ACS to start heparin per pharmacy. She is therapeutic on her home coumadin dose with an INR of 2.5 today.  Her troponins are + and her coumadin is being held for cardiac cath Goal of Therapy:  Heparin level 0.3-0.7 units/ml Monitor platelets by anticoagulation protocol: Yes   Plan:  1. No bolus 2nd INR 2.5 2. Heparin drip at 12 units/kg/hr = 850 units/hr 3. Check 8 hr heparin level 4. Daily HL and CBC while on heparin Herby Abraham, Pharm.D. [phone] 11/04/2011 2:55 PM

## 2011-11-05 ENCOUNTER — Other Ambulatory Visit: Payer: Self-pay

## 2011-11-05 DIAGNOSIS — I517 Cardiomegaly: Secondary | ICD-10-CM

## 2011-11-05 LAB — GLUCOSE, CAPILLARY: Glucose-Capillary: 274 mg/dL — ABNORMAL HIGH (ref 70–99)

## 2011-11-05 LAB — LIPID PANEL
HDL: 37 mg/dL — ABNORMAL LOW (ref >39)
LDL Cholesterol: UNDETERMINED mg/dL (ref 0–99)
Total CHOL/HDL Ratio: 6.9 RATIO
VLDL: UNDETERMINED mg/dL (ref 0–40)

## 2011-11-05 LAB — COMPREHENSIVE METABOLIC PANEL
ALT: 18 U/L (ref 0–35)
AST: 19 U/L (ref 0–37)
Albumin: 3.3 g/dL — ABNORMAL LOW (ref 3.5–5.2)
Calcium: 10.2 mg/dL (ref 8.4–10.5)
Chloride: 100 mEq/L (ref 96–112)
Creatinine, Ser: 0.6 mg/dL (ref 0.50–1.10)
Sodium: 140 mEq/L (ref 135–145)

## 2011-11-05 LAB — CBC
Hemoglobin: 14.5 g/dL (ref 12.0–15.0)
MCH: 32 pg (ref 26.0–34.0)
MCV: 95.1 fL (ref 78.0–100.0)
RBC: 4.53 MIL/uL (ref 3.87–5.11)

## 2011-11-05 LAB — PROTIME-INR
INR: 2.22 — ABNORMAL HIGH (ref 0.00–1.49)
INR: 2.04 — ABNORMAL HIGH (ref 0.00–1.49)
Prothrombin Time: 25 seconds — ABNORMAL HIGH (ref 11.6–15.2)
Prothrombin Time: 23.4 seconds — ABNORMAL HIGH (ref 11.6–15.2)

## 2011-11-05 LAB — CARDIAC PANEL(CRET KIN+CKTOT+MB+TROPI): Troponin I: 3.65 ng/mL (ref <0.30)

## 2011-11-05 MED ORDER — INSULIN ASPART 100 UNIT/ML ~~LOC~~ SOLN
4.0000 [IU] | Freq: Three times a day (TID) | SUBCUTANEOUS | Status: DC
  Administered 2011-11-05 (×2): 10 [IU] via SUBCUTANEOUS
  Administered 2011-11-06: 6 [IU] via SUBCUTANEOUS
  Administered 2011-11-06 – 2011-11-08 (×7): 4 [IU] via SUBCUTANEOUS
  Administered 2011-11-08 – 2011-11-09 (×2): 6 [IU] via SUBCUTANEOUS
  Administered 2011-11-09: 14 [IU] via SUBCUTANEOUS

## 2011-11-05 MED ORDER — INSULIN GLARGINE 100 UNIT/ML ~~LOC~~ SOLN
40.0000 [IU] | Freq: Every day | SUBCUTANEOUS | Status: DC
  Administered 2011-11-05: 40 [IU] via SUBCUTANEOUS

## 2011-11-05 MED ORDER — HEPARIN (PORCINE) IN NACL 100-0.45 UNIT/ML-% IJ SOLN
1050.0000 [IU]/h | INTRAMUSCULAR | Status: DC
  Administered 2011-11-05: 850 [IU]/h via INTRAVENOUS
  Filled 2011-11-05 (×2): qty 250

## 2011-11-05 MED ORDER — INSULIN GLARGINE 100 UNIT/ML ~~LOC~~ SOLN
25.0000 [IU] | Freq: Every day | SUBCUTANEOUS | Status: DC
  Administered 2011-11-05: 25 [IU] via SUBCUTANEOUS

## 2011-11-05 MED ORDER — INSULIN ASPART 100 UNIT/ML ~~LOC~~ SOLN
12.0000 [IU] | Freq: Every day | SUBCUTANEOUS | Status: DC
  Administered 2011-11-05: 12 [IU] via SUBCUTANEOUS

## 2011-11-05 MED ORDER — INSULIN ASPART 100 UNIT/ML ~~LOC~~ SOLN
10.0000 [IU] | Freq: Two times a day (BID) | SUBCUTANEOUS | Status: DC
  Administered 2011-11-05 – 2011-11-06 (×2): 10 [IU] via SUBCUTANEOUS

## 2011-11-05 MED ORDER — INSULIN ASPART 100 UNIT/ML ~~LOC~~ SOLN: 10.0000 [IU] | Freq: Three times a day (TID) | SUBCUTANEOUS | Status: DC

## 2011-11-05 MED ORDER — ASPIRIN 81 MG PO CHEW
81.0000 mg | CHEWABLE_TABLET | Freq: Every day | ORAL | Status: DC
  Administered 2011-11-05 – 2011-11-14 (×9): 81 mg via ORAL
  Filled 2011-11-05: qty 4
  Filled 2011-11-05 (×9): qty 1

## 2011-11-05 NOTE — Progress Notes (Addendum)
ANTICOAGULATION CONSULT NOTE - Initial Consult  Pharmacy Consult for heparin when INR < 2 Indication: chest pain/ACS  Allergies  Allergen Reactions  . Ivp Dye (Iodinated Diagnostic Agents)   . Codeine   . Morphine   . Statins     Patient Measurements: Height: 5\' 4"  (162.6 cm) Weight: 162 lb 11.2 oz (73.8 kg) IBW/kg (Calculated) : 54.7  Heparin Dosing Weight: 70.3  Vital Signs: Temp: 98 F (36.7 C) (07/25 1200) Temp src: Oral (07/25 1200) BP: 124/52 mmHg (07/25 1200) Pulse Rate: 67  (07/25 1200)  Labs:  Basename 11/05/11 1440 11/05/11 0307 11/04/11 2046 11/04/11 1449 11/04/11 0855  HGB -- 14.5 -- -- 13.9  HCT -- 43.1 -- -- 41.1  PLT -- 352 -- -- 333  APTT -- -- -- -- --  LABPROT 23.4* 25.0* -- -- 27.9*  INR 2.04* 2.22* -- -- 2.56*  HEPARINUNFRC -- -- -- -- --  CREATININE -- 0.60 -- -- 0.64  CKTOTAL -- 42 50 54 --  CKMB -- 2.7 3.2 3.0 --  TROPONINI -- 3.65* 3.53* 3.38* --    Estimated Creatinine Clearance: 55.2 ml/min (by C-G formula based on Cr of 0.6).   Medical History: Past Medical History  Diagnosis Date  . Other chronic pulmonary heart diseases   . HTN (hypertension)   . Coronary atherosclerosis of unspecified type of vessel, native or graft     remote CABG in 1999 with Dr. Laneta Simmers, S/P PCI with stent to LCX in 2009  . Personal history of other diseases of digestive system   . Gout, unspecified   . Unspecified hereditary and idiopathic peripheral neuropathy   . Esophageal reflux   . S/P coronary artery stent placement 06/21/07    Platinum protocol study. Patent at follow up  . Anemia   . GI bleed   . Primary pancreatic carcinoid tumor   . 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 Leukocytosis 03/16/2011  . Hyperlipidemia   . CLL (chronic lymphocytic leukemia)     seeing Dr. [name]  . Pancreas disorder     prior pancreatectomy in 2011 complicated by leak requiring prolong and repeated drainage and subsequent pancreatic stent    . Dysrhythmia 11/04/2011    atrial fibrilation  . Shortness of breath   . Type I (juvenile type) diabetes mellitus without mention of complication, not stated as uncontrolled     has insulin pump    Medications:  Prescriptions prior to admission  Medication Sig Dispense Refill  . acetaminophen (TYLENOL) 325 MG tablet Take 325 mg by mouth as needed.        Marland Kitchen amLODipine (NORVASC) 10 MG tablet Take 5 mg by mouth daily. PT TAKES ONLY WHEN SHE THINKS SHE NEEDS IT      . BYSTOLIC 5 MG tablet Take 5 mg by mouth daily.       Marland Kitchen COLCRYS 0.6 MG tablet Take 0.6 mg by mouth daily. Dr Catalina Pizza instructed pt. To stop this med the end of this week and take Uloric      . febuxostat (ULORIC) 40 MG tablet Take 40 mg by mouth daily.      Marland Kitchen gabapentin (NEURONTIN) 300 MG capsule Take 300 mg by mouth 3 (three) times daily.       . insulin aspart (NOVOLOG) 100 UNIT/ML injection Inject into the skin continuous. Basal rate 1.25 U/hr midnight-6 am; 2.6 U/hr till 9 am; 3.0 U/hr till 1 pm; 3.9 U/hr till 5 pm; 4.1 U/hr till 6  pm; 4.5 U/hr till 10:30 pm; 1.7 U/hr till midnight. Total basal daily insulin 69.8 Units (per 24 hr).       . loperamide (IMODIUM) 2 MG capsule Take 2 mg by mouth as needed.        . Magnesium 250 MG TABS Take 500 mg by mouth daily.       . metoprolol tartrate (LOPRESSOR) 25 MG tablet Take 1 tablet (25 mg total) by mouth 2 (two) times daily.  60 tablet  3  . nitroGLYCERIN (NITROLINGUAL) 0.4 MG/SPRAY spray Place 1 spray under the tongue every 5 (five) minutes as needed.       Marland Kitchen olmesartan (BENICAR) 40 MG tablet Take 40 mg by mouth every morning.       . Omega-3 Fatty Acids (FISH OIL) 1000 MG CAPS Take by mouth daily.        . polyethylene glycol (MIRALAX / GLYCOLAX) packet Take 17 g by mouth as needed.      . Prenatal Multivit-Min-Fe-FA (PRENATAL VITAMINS) 0.8 MG tablet Take 1 tablet by mouth daily.       . promethazine (PHENERGAN) 12.5 MG tablet Take 12.5 mg by mouth as needed.        . traMADol  (ULTRAM) 50 MG tablet Take 50 mg by mouth daily as needed. pain      . warfarin (COUMADIN) 2.5 MG tablet Take 2.5-4 mg by mouth daily. TAKES 2.5 MG ON Sunday, Tuesday, Thursday and Saturday. Takes 4 mg on Monday, Wednesday and Fridays  1 tablet  0  . Cholecalciferol (VITAMIN D3) 50000 UNITS CAPS Take 50,000 Units by mouth every 7 (seven) days. TAKES ON THURSDAYS        Assessment: [name] is a 78 yo f admitted with ACS to start heparin per pharmacy. She is on chronic Coumadin, which was held for cath lab soon.   AM INR 2.2, repeat check this afternoon is 2.04.  Suspect will be  < 2 within a few hours.  No bleeding noted.  Goal of Therapy:  Heparin level 0.3-0.7 units/ml Monitor platelets by anticoagulation protocol: Yes   Plan:  1. At 1800 PM tonight, will start IV heparin 850 units/hr. 2. Check heparin level 6 hrs after gtt starts. 3. Daily heparin level and CBC. 4. F/U plans for cath, resume Coumadin after cath.  Reece Leader, Pharm D 11/05/2011 3:25 PM

## 2011-11-05 NOTE — Progress Notes (Signed)
Utilization Review Completed.  Bransen Fassnacht T  11/05/2011  

## 2011-11-05 NOTE — Progress Notes (Signed)
*  PRELIMINARY RESULTS* Echocardiogram 2D Echocardiogram has been performed.  Jeryl Columbia 11/05/2011, 4:52 PM

## 2011-11-05 NOTE — Progress Notes (Signed)
Subjective: High blood sugars  Her blood sugars have gradually increased since her insulin pump was stopped. With her insulin pump she has a basal rate of about 70 units and total daily daily doses of about 90 units. However was started on relatively conservative basal bolus regimen. She has not had much food intake, apparently 50% of her dinner last night. We'll need to considerably increase her basal insulin as well as coverage for meals and correction. She is currently not ambulating. We'll continue to follow and adjust her insulin as needed HYPERTENSION: Blood pressure has been variable but better this morning  Objective: Vital signs in last 24 hours: Temp:  [97.7 F (36.5 C)-98.6 F (37 C)] 97.9 F (36.6 C) (07/25 0725) Pulse Rate:  [67-94] 74  (07/25 0725) Resp:  [17-29] 19  (07/25 0725) BP: (136-176)/(50-94) 155/55 mmHg (07/25 0725) SpO2:  [93 %-98 %] 97 % (07/25 0725) FiO2 (%):  [2 %] 2 % (07/24 1053) Weight:  [73.8 kg (162 lb 11.2 oz)-74.844 kg (165 lb)] 73.8 kg (162 lb 11.2 oz) (07/25 0458) Weight change:  Last BM Date: 11/04/11  CBG (last 3)   Basename 11/05/11 0726 11/05/11 0346 11/04/11 2355  GLUCAP 264* 225* 198*     Intake/Output from previous day: 07/24 0701 - 07/25 0700 In: 410 [P.O.:410] Out: 675 [Urine:675] Intake/Output this shift:    General appearance: alert, cooperative and no distress  Lab Results:  [facility] 11/05/11 0307 11/04/11 0855  WBC 16.2* 14.0*  HGB 14.5 13.9  HCT 43.1 41.1  PLT 352 333   BMET  Basename 11/05/11 0307 11/04/11 0855  NA 140 135  K 4.9 3.4*  CL 100 97  CO2 30 28  GLUCOSE 264* 182*  BUN 19 21  CREATININE 0.60 0.64  CALCIUM 10.2 10.4    Studies/Results: Dg Chest Portable 1 View  11/04/2011  *RADIOLOGY REPORT*  Clinical Data: Chest pain.  Pancreatic neoplasm.  PORTABLE CHEST - 1 VIEW  Comparison: 11/15/2011most recent.  Multiple priors.  Findings: Cardiomegaly.  Prior CABG.  No infiltrates or failure. No pulmonary  nodules are seen.  Bones are demineralized.  Similar appearance to priors.  IMPRESSION: Cardiomegaly, no active disease.  Original Report Authenticated By: Elsie Stain, M.D.    Medications: I have reviewed the patient's current medications.  Assessment/Plan: Increase insulin, with increased Lantus doses from a total amount of 40 units to 65 units and mealtime insulin will be increased to 10-14 units. We'll plan to start her on the insulin pump back on the morning of discharge  LOS: 1 day   Shanea Karney 11/05/2011, 8:08 AM

## 2011-11-05 NOTE — Progress Notes (Signed)
Subjective:  No current pain.  Trop are positive, but MB are neg--?from last week.    Objective:  Vital Signs in the last 24 hours: Temp:  [97.7 F (36.5 C)-98.6 F (37 C)] 98 F (36.7 C) (07/25 1200) Pulse Rate:  [67-94] 67  (07/25 1200) Resp:  [17-29] 25  (07/25 1200) BP: (124-165)/(50-94) 124/52 mmHg (07/25 1200) SpO2:  [93 %-98 %] 96 % (07/25 1200) Weight:  [162 lb 11.2 oz (73.8 kg)] 162 lb 11.2 oz (73.8 kg) (07/25 0458)  Intake/Output from previous day: 07/24 0701 - 07/25 0700 In: 410 [P.O.:410] Out: 675 [Urine:675]   Physical Exam: General: Well developed, well nourished, in no acute distress. Head:  Normocephalic and atraumatic. Lungs: Clear to auscultation and percussion. Heart: Normal S1 and S2.irregularly irregular rhythm.  Pulses: Pulses normal in all 4 extremities. Extremities: No clubbing or cyanosis. No edema. Neurologic: Alert and oriented x 3.    Lab Results:  Basename 11/05/11 0307 11/04/11 0855  WBC 16.2* 14.0*  HGB 14.5 13.9  PLT 352 333    Basename 11/05/11 0307 11/04/11 0855  NA 140 135  K 4.9 3.4*  CL 100 97  CO2 30 28  GLUCOSE 264* 182*  BUN 19 21  CREATININE 0.60 0.64    Basename 11/05/11 0307 11/04/11 2046  TROPONINI 3.65* 3.53*   Hepatic Function Panel  Basename 11/05/11 0307  PROT 6.7  ALBUMIN 3.3*  AST 19  ALT 18  ALKPHOS 84  BILITOT 0.3  BILIDIR --  IBILI --    Basename 11/05/11 0307  CHOL 257*   No results found for this basename: PROTIME in the last 72 hours  Imaging: Dg Chest Portable 1 View  11/04/2011  *RADIOLOGY REPORT*  Clinical Data: Chest pain.  Pancreatic neoplasm.  PORTABLE CHEST - 1 VIEW  Comparison: 11/15/2011most recent.  Multiple priors.  Findings: Cardiomegaly.  Prior CABG.  No infiltrates or failure. No pulmonary nodules are seen.  Bones are demineralized.  Similar appearance to priors.  IMPRESSION: Cardiomegaly, no active disease.  Original Report Authenticated By: Elsie Stain, M.D.    EKG:   No definite ST changes.  NSR.    Cardiac Studies:  Echo pending.  Trop   Assessment/Plan:  Patient Active Hospital Problem List: Non-ST elevation myocardial infarction (NSTEMI), initial episode of care (11/04/2011)   Assessment: stable.  No pain.   Plan: start heparin, continue ASA, pending warfarin coming down plan cath study IDDM (07/09/2008)   Assessment: appreciate endocrine care   Plan: continue  Leukocytosis (03/16/2011)   Assessment: chronic   Plan: monitor Atrial fibrillation (10/18/2011)   Assessment: rate is controlled   Plan: continued current meds Chronic anticoagulation (11/04/2011)   Assessment: just recently started with some concern about med compliance   Plan: continue to follow in [place] OP facility after discharge      Shawnie Pons, MD, [facility], [facility] 11/05/2011, 12:55 PM

## 2011-11-05 NOTE — Progress Notes (Signed)
ANTICOAGULATION CONSULT NOTE - Follow Up Consult  Pharmacy Consult for heparin when INR < 2 Indication: chest pain/ACS  Allergies  Allergen Reactions  . Ivp Dye (Iodinated Diagnostic Agents)   . Codeine   . Morphine   . Statins     Patient Measurements: Height: 5\' 4"  (162.6 cm) Weight: 162 lb 11.2 oz (73.8 kg) IBW/kg (Calculated) : 54.7  Heparin Dosing Weight: 70.3  Vital Signs: Temp: 97.8 F (36.6 C) (07/25 2000) Temp src: Oral (07/25 2000) BP: 137/47 mmHg (07/25 1930) Pulse Rate: 87  (07/25 1600)  Labs:  [name] 11/05/11 2158 11/05/11 1440 11/05/11 0307 11/04/11 2046 11/04/11 1449 11/04/11 0855  HGB -- -- 14.5 -- -- 13.9  HCT -- -- 43.1 -- -- 41.1  PLT -- -- 352 -- -- 333  APTT -- -- -- -- -- --  LABPROT -- 23.4* 25.0* -- -- 27.9*  INR -- 2.04* 2.22* -- -- 2.56*  HEPARINUNFRC <0.10* -- -- -- -- --  CREATININE -- -- 0.60 -- -- 0.64  CKTOTAL -- -- 42 50 54 --  CKMB -- -- 2.7 3.2 3.0 --  TROPONINI -- -- 3.65* 3.53* 3.38* --    Estimated Creatinine Clearance: 55.2 ml/min (by C-G formula based on Cr of 0.6).   Medical History: Past Medical History  Diagnosis Date  . Other chronic pulmonary heart diseases   . HTN (hypertension)   . Coronary atherosclerosis of unspecified type of vessel, native or graft     remote CABG in 1999 with Dr. Laneta Simmers, S/P PCI with stent to LCX in 2009  . Personal history of other diseases of digestive system   . Gout, unspecified   . Unspecified hereditary and idiopathic peripheral neuropathy   . Esophageal reflux   . S/P coronary artery stent placement 06/21/07    Platinum protocol study. Patent at follow up  . Anemia   . GI bleed   . Primary pancreatic carcinoid tumor   . 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 Leukocytosis 03/16/2011  . Hyperlipidemia   . CLL (chronic lymphocytic leukemia)     seeing Dr. [name]  . Pancreas disorder     prior pancreatectomy in 2011 complicated by leak requiring prolong  and repeated drainage and subsequent pancreatic stent  . Dysrhythmia 11/04/2011    atrial fibrilation  . Shortness of breath   . Type I (juvenile type) diabetes mellitus without mention of complication, not stated as uncontrolled     has insulin pump    Medications:  Prescriptions prior to admission  Medication Sig Dispense Refill  . acetaminophen (TYLENOL) 325 MG tablet Take 325 mg by mouth as needed.        Marland Kitchen amLODipine (NORVASC) 10 MG tablet Take 5 mg by mouth daily. PT TAKES ONLY WHEN SHE THINKS SHE NEEDS IT      . BYSTOLIC 5 MG tablet Take 5 mg by mouth daily.       Marland Kitchen COLCRYS 0.6 MG tablet Take 0.6 mg by mouth daily. Dr Catalina Pizza instructed pt. To stop this med the end of this week and take Uloric      . febuxostat (ULORIC) 40 MG tablet Take 40 mg by mouth daily.      Marland Kitchen gabapentin (NEURONTIN) 300 MG capsule Take 300 mg by mouth 3 (three) times daily.       . insulin aspart (NOVOLOG) 100 UNIT/ML injection Inject into the skin continuous. Basal rate 1.25 U/hr midnight-6 am; 2.6 U/hr till 9 am; 3.0  U/hr till 1 pm; 3.9 U/hr till 5 pm; 4.1 U/hr till 6 pm; 4.5 U/hr till 10:30 pm; 1.7 U/hr till midnight. Total basal daily insulin 69.8 Units (per 24 hr).       . loperamide (IMODIUM) 2 MG capsule Take 2 mg by mouth as needed.        . Magnesium 250 MG TABS Take 500 mg by mouth daily.       . metoprolol tartrate (LOPRESSOR) 25 MG tablet Take 1 tablet (25 mg total) by mouth 2 (two) times daily.  60 tablet  3  . nitroGLYCERIN (NITROLINGUAL) 0.4 MG/SPRAY spray Place 1 spray under the tongue every 5 (five) minutes as needed.       Marland Kitchen olmesartan (BENICAR) 40 MG tablet Take 40 mg by mouth every morning.       . Omega-3 Fatty Acids (FISH OIL) 1000 MG CAPS Take by mouth daily.        . polyethylene glycol (MIRALAX / GLYCOLAX) packet Take 17 g by mouth as needed.      . Prenatal Multivit-Min-Fe-FA (PRENATAL VITAMINS) 0.8 MG tablet Take 1 tablet by mouth daily.       . promethazine (PHENERGAN) 12.5 MG  tablet Take 12.5 mg by mouth as needed.        . traMADol (ULTRAM) 50 MG tablet Take 50 mg by mouth daily as needed. pain      . warfarin (COUMADIN) 2.5 MG tablet Take 2.5-4 mg by mouth daily. TAKES 2.5 MG ON Sunday, Tuesday, Thursday and Saturday. Takes 4 mg on Monday, Wednesday and Fridays  1 tablet  0  . Cholecalciferol (VITAMIN D3) 50000 UNITS CAPS Take 50,000 Units by mouth every 7 (seven) days. TAKES ON THURSDAYS        Assessment: [name] is a 78 yo f admitted with ACS to start heparin per pharmacy. She is on chronic Coumadin, which was held for pending cath. Heparin level (<0.1) is below-goal on 850 units/hr. No problem with line/infusion per RN.   Goal of Therapy:  Heparin level 0.3-0.7 units/ml Monitor platelets by anticoagulation protocol: Yes   Plan:  1. Increase IV heparin to 1050 units/hr.  2. Heparin level in 8 hours.   Lorre Munroe PharmD  11/05/2011 11:39 PM

## 2011-11-06 ENCOUNTER — Encounter (HOSPITAL_COMMUNITY)
Admission: EM | Disposition: A | Discharge status: Home Health | Payer: Self-pay | Source: Home / Self Care | Attending: Internal Medicine

## 2011-11-06 LAB — CBC
MCH: 31.4 pg (ref 26.0–34.0)
MCHC: 32.6 g/dL (ref 30.0–36.0)
Platelets: 341 10*3/uL (ref 150–400)
RBC: 4.56 MIL/uL (ref 3.87–5.11)

## 2011-11-06 LAB — HEPARIN LEVEL (UNFRACTIONATED)
Heparin Unfractionated: 0.67 IU/mL (ref 0.30–0.70)
Heparin Unfractionated: 0.79 IU/mL — ABNORMAL HIGH (ref 0.30–0.70)

## 2011-11-06 LAB — GLUCOSE, CAPILLARY
Glucose-Capillary: 210 mg/dL — ABNORMAL HIGH (ref 70–99)
Glucose-Capillary: 203 mg/dL — ABNORMAL HIGH (ref 70–99)
Glucose-Capillary: 184 mg/dL — ABNORMAL HIGH (ref 70–99)

## 2011-11-06 LAB — PROTIME-INR
INR: 1.63 — ABNORMAL HIGH (ref 0.00–1.49)
Prothrombin Time: 21.2 seconds — ABNORMAL HIGH (ref 11.6–15.2)
Prothrombin Time: 19.6 seconds — ABNORMAL HIGH (ref 11.6–15.2)

## 2011-11-06 SURGERY — LEFT HEART CATHETERIZATION WITH CORONARY ANGIOGRAM
Anesthesia: LOCAL

## 2011-11-06 MED ORDER — INSULIN ASPART 100 UNIT/ML ~~LOC~~ SOLN
14.0000 [IU] | Freq: Two times a day (BID) | SUBCUTANEOUS | Status: DC
  Administered 2011-11-07 – 2011-11-10 (×6): 14 [IU] via SUBCUTANEOUS
  Administered 2011-11-11: 10 [IU] via SUBCUTANEOUS

## 2011-11-06 MED ORDER — INSULIN GLARGINE 100 UNIT/ML ~~LOC~~ SOLN: 50.0000 [IU] | Freq: Every day | SUBCUTANEOUS | Status: DC

## 2011-11-06 MED ORDER — INSULIN GLARGINE 100 UNIT/ML ~~LOC~~ SOLN
45.0000 [IU] | Freq: Every day | SUBCUTANEOUS | Status: DC
  Administered 2011-11-06: 10:00:00 via SUBCUTANEOUS
  Administered 2011-11-07 – 2011-11-08 (×2): 45 [IU] via SUBCUTANEOUS

## 2011-11-06 MED ORDER — INSULIN ASPART 100 UNIT/ML ~~LOC~~ SOLN
14.0000 [IU] | Freq: Every day | SUBCUTANEOUS | Status: DC
  Administered 2011-11-06 – 2011-11-08 (×3): 14 [IU] via SUBCUTANEOUS

## 2011-11-06 MED ORDER — HEPARIN (PORCINE) IN NACL 100-0.45 UNIT/ML-% IJ SOLN
850.0000 [IU]/h | INTRAMUSCULAR | Status: DC
  Administered 2011-11-06 – 2011-11-09 (×3): 850 [IU]/h via INTRAVENOUS
  Filled 2011-11-06 (×3): qty 250

## 2011-11-06 MED ORDER — INSULIN GLARGINE 100 UNIT/ML ~~LOC~~ SOLN
35.0000 [IU] | Freq: Every day | SUBCUTANEOUS | Status: DC
  Administered 2011-11-06: 35 [IU] via SUBCUTANEOUS

## 2011-11-06 NOTE — Progress Notes (Addendum)
ANTICOAGULATION CONSULT NOTE - Follow Up Consult  Pharmacy Consult for heparin when INR < 2 Indication: chest pain/ACS  Allergies  Allergen Reactions  . Ivp Dye (Iodinated Diagnostic Agents)   . Codeine   . Morphine   . Statins     Patient Measurements: Height: 5\' 4"  (162.6 cm) Weight: 161 lb 6 oz (73.2 kg) (stand up weight) IBW/kg (Calculated) : 54.7  Heparin Dosing Weight: 70.3  Vital Signs: Temp: 97.4 F (36.3 C) (07/26 0745) Temp src: Oral (07/26 0745) BP: 137/65 mmHg (07/26 0745) Pulse Rate: 64  (07/26 0745)  Labs:  [name] 11/06/11 0831 11/06/11 0528 11/05/11 2158 11/05/11 1440 11/05/11 0307 11/04/11 2046 11/04/11 1449 11/04/11 0855  HGB -- 14.3 -- -- 14.5 -- -- --  HCT -- 43.9 -- -- 43.1 -- -- 41.1  PLT -- 341 -- -- 352 -- -- 333  APTT -- -- -- -- -- -- -- --  LABPROT -- 21.2* -- 23.4* 25.0* -- -- --  INR -- 1.80* -- 2.04* 2.22* -- -- --  HEPARINUNFRC 0.67 -- <0.10* -- -- -- -- --  CREATININE -- -- -- -- 0.60 -- -- 0.64  CKTOTAL -- -- -- -- 42 50 54 --  CKMB -- -- -- -- 2.7 3.2 3.0 --  TROPONINI -- -- -- -- 3.65* 3.53* 3.38* --    Estimated Creatinine Clearance: 55 ml/min (by C-G formula based on Cr of 0.6).   Medical History: Past Medical History  Diagnosis Date  . Other chronic pulmonary heart diseases   . HTN (hypertension)   . Coronary atherosclerosis of unspecified type of vessel, native or graft     remote CABG in 1999 with Dr. Laneta Simmers, S/P PCI with stent to LCX in 2009  . Personal history of other diseases of digestive system   . Gout, unspecified   . Unspecified hereditary and idiopathic peripheral neuropathy   . Esophageal reflux   . S/P coronary artery stent placement 06/21/07    Platinum protocol study. Patent at follow up  . Anemia   . GI bleed   . Primary pancreatic carcinoid tumor   . 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 Leukocytosis 03/16/2011  . Hyperlipidemia   . CLL (chronic lymphocytic leukemia)    seeing Dr. [name]  . Pancreas disorder     prior pancreatectomy in 2011 complicated by leak requiring prolong and repeated drainage and subsequent pancreatic stent  . Dysrhythmia 11/04/2011    atrial fibrilation  . Shortness of breath   . Type I (juvenile type) diabetes mellitus without mention of complication, not stated as uncontrolled     has insulin pump    Medications:  Prescriptions prior to admission  Medication Sig Dispense Refill  . acetaminophen (TYLENOL) 325 MG tablet Take 325 mg by mouth as needed.        Marland Kitchen amLODipine (NORVASC) 10 MG tablet Take 5 mg by mouth daily. PT TAKES ONLY WHEN SHE THINKS SHE NEEDS IT      . BYSTOLIC 5 MG tablet Take 5 mg by mouth daily.       Marland Kitchen COLCRYS 0.6 MG tablet Take 0.6 mg by mouth daily. Dr Catalina Pizza instructed pt. To stop this med the end of this week and take Uloric      . febuxostat (ULORIC) 40 MG tablet Take 40 mg by mouth daily.      Marland Kitchen gabapentin (NEURONTIN) 300 MG capsule Take 300 mg by mouth 3 (three) times daily.       Marland Kitchen  insulin aspart (NOVOLOG) 100 UNIT/ML injection Inject into the skin continuous. Basal rate 1.25 U/hr midnight-6 am; 2.6 U/hr till 9 am; 3.0 U/hr till 1 pm; 3.9 U/hr till 5 pm; 4.1 U/hr till 6 pm; 4.5 U/hr till 10:30 pm; 1.7 U/hr till midnight. Total basal daily insulin 69.8 Units (per 24 hr).       . loperamide (IMODIUM) 2 MG capsule Take 2 mg by mouth as needed.        . Magnesium 250 MG TABS Take 500 mg by mouth daily.       . metoprolol tartrate (LOPRESSOR) 25 MG tablet Take 1 tablet (25 mg total) by mouth 2 (two) times daily.  60 tablet  3  . nitroGLYCERIN (NITROLINGUAL) 0.4 MG/SPRAY spray Place 1 spray under the tongue every 5 (five) minutes as needed.       Marland Kitchen olmesartan (BENICAR) 40 MG tablet Take 40 mg by mouth every morning.       . Omega-3 Fatty Acids (FISH OIL) 1000 MG CAPS Take by mouth daily.        . polyethylene glycol (MIRALAX / GLYCOLAX) packet Take 17 g by mouth as needed.      . Prenatal  Multivit-Min-Fe-FA (PRENATAL VITAMINS) 0.8 MG tablet Take 1 tablet by mouth daily.       . promethazine (PHENERGAN) 12.5 MG tablet Take 12.5 mg by mouth as needed.        . traMADol (ULTRAM) 50 MG tablet Take 50 mg by mouth daily as needed. pain      . warfarin (COUMADIN) 2.5 MG tablet Take 2.5-4 mg by mouth daily. TAKES 2.5 MG ON Sunday, Tuesday, Thursday and Saturday. Takes 4 mg on Monday, Wednesday and Fridays  1 tablet  0  . Cholecalciferol (VITAMIN D3) 50000 UNITS CAPS Take 50,000 Units by mouth every 7 (seven) days. TAKES ON THURSDAYS        Assessment: Ms. Monsivais is a 78 yo f admitted with ACS to start heparin per pharmacy. She is on chronic Coumadin, which was held for pending cath. Heparin level (<0.1) is below-goal on 850 units/hr last night; rate increased to 1050 units/hr, and now level is at upper end of range (0.67).  No bleeding noted per chart notes.  Goal of Therapy:  Heparin level 0.3-0.7 units/ml Monitor platelets by anticoagulation protocol: Yes   Plan:  1. Decrease IV heparin rate to 950 units/hr to try to keep from rising above range.  2. Repeat heparin level in 6 hrs. 3. Continue daily heparin level and CBC.  Reece Leader, Pharm D 11/06/2011 10:40 AM

## 2011-11-06 NOTE — Progress Notes (Signed)
Subjective: CBG (last 3)   Basename 11/06/11 0749 11/05/11 2159 11/05/11 1948  GLUCAP 210* 190* 276*   Patient's insulin dose has been increased yesterday with blood sugars still relatively high around 200. She is not eating this morning because of planned procedure. We'll continue to increase her Lantus to keep glucose about 150-200 range.   Objective: Vital signs in last 24 hours: Temp:  [97.4 F (36.3 C)-98.1 F (36.7 C)] 97.4 F (36.3 C) (07/26 0745) Pulse Rate:  [64-87] 64  (07/26 0745) Resp:  [10-28] 18  (07/26 0745) BP: (124-161)/(35-65) 137/65 mmHg (07/26 0745) SpO2:  [95 %-97 %] 97 % (07/26 0745) Weight:  [73.2 kg (161 lb 6 oz)] 73.2 kg (161 lb 6 oz) (07/26 0600) Weight change: -1.643 kg (-3 lb 10 oz) Last BM Date: 11/05/11  Intake/Output from previous day: 07/25 0701 - 07/26 0700 In: 1209.8 [P.O.:960; I.V.:249.8] Out: 1926 [Urine:1925; Stool:1] Intake/Output this shift:    General appearance: alert and no distress  Lab Results:  [facility] 11/06/11 0528 11/05/11 0307  WBC 18.5* 16.2*  HGB 14.3 14.5  HCT 43.9 43.1  PLT 341 352   BMET  Basename 11/05/11 0307 11/04/11 0855  NA 140 135  K 4.9 3.4*  CL 100 97  CO2 30 28  GLUCOSE 264* 182*  BUN 19 21  CREATININE 0.60 0.64  CALCIUM 10.2 10.4    Studies/Results: Dg Chest Portable 1 View  11/04/2011  *RADIOLOGY REPORT*  Clinical Data: Chest pain.  Pancreatic neoplasm.  PORTABLE CHEST - 1 VIEW  Comparison: 11/15/2011most recent.  Multiple priors.  Findings: Cardiomegaly.  Prior CABG.  No infiltrates or failure. No pulmonary nodules are seen.  Bones are demineralized.  Similar appearance to priors.  IMPRESSION: Cardiomegaly, no active disease.  Original Report Authenticated By: Elsie Stain, M.D.    Medications: I have reviewed the patient's current medications.  Scheduled Meds:   . amLODipine  5 mg Oral Daily  . aspirin  81 mg Oral Daily  . colchicine  0.6 mg Oral Daily  . febuxostat  40 mg Oral Daily    . gabapentin  300 mg Oral TID  . insulin aspart  10 Units Subcutaneous BID WC  . insulin aspart  12 Units Subcutaneous Q supper  . insulin aspart  4-14 Units Subcutaneous TID AC  . insulin glargine  25 Units Subcutaneous QHS  . insulin glargine  40 Units Subcutaneous Daily  . irbesartan  150 mg Oral Daily  . magnesium oxide  400 mg Oral Daily  . metoprolol tartrate  12.5 mg Oral TID  . omega-3 acid ethyl esters  1 g Oral Daily  . prenatal multivitamin  1 tablet Oral Daily  . sodium chloride  3 mL Intravenous Q12H  . Vitamin D (Ergocalciferol)  50,000 Units Oral Q7 days  . DISCONTD: aspirin  324 mg Oral Once  . DISCONTD: insulin aspart  10-12 Units Subcutaneous TID WC  . DISCONTD: insulin aspart  2-8 Units Subcutaneous TID WC  . DISCONTD: insulin aspart  6 Units Subcutaneous TID WC  . DISCONTD: insulin glargine  20 Units Subcutaneous BID   Continuous Infusions:   . heparin 1,050 Units/hr (11/05/11 2349)   PRN Meds:.sodium chloride, acetaminophen, ALPRAZolam, loperamide, nitroGLYCERIN, ondansetron (ZOFRAN) IV, polyethylene glycol, sodium chloride, traMADol, zolpidem  Assessment/Plan: Still mildly hyperglycemic, blood sugar 210 this morning. We'll increase her Lantus by 10 units twice a day    LOS: 2 days   [name] 11/06/2011, 7:59 AM

## 2011-11-06 NOTE — Progress Notes (Addendum)
Patient was made NPO for lunch pending repeat INR result at 1300, now at 1530, Dr. Riley Kill here and pending imminent plans for cardiac cath very soon when IVP dye allergy was noted, patient had not been prepped for IVP dye allergy, cardiac cath now delayed until Monday 7/29, patient understandibly upset over delay, emotional support given, snack obtained by CNA until dinner arrives, Jyl Heinz RN

## 2011-11-06 NOTE — Progress Notes (Signed)
Patient Name: [name] Date of Encounter: 11/06/2011  Principal Problem:  *Non-ST elevation myocardial infarction (NSTEMI), initial episode of care Active Problems:  IDDM  Leukocytosis  Atrial fibrillation  Chronic anticoagulation    SUBJECTIVE:    OBJECTIVE Filed Vitals:   11/06/11 0350 11/06/11 0400 11/06/11 0600 11/06/11 0745  BP: 137/35   137/65  Pulse:    64  Temp:  97.5 F (36.4 C)  97.4 F (36.3 C)  TempSrc:  Oral  Oral  Resp:  20  18  Height:      Weight:   161 lb 6 oz (73.2 kg)   SpO2:  96%  97%    Intake/Output Summary (Last 24 hours) at 11/06/11 1025 Last data filed at 11/06/11 0600  Gross per 24 hour  Intake 849.78 ml  Output   1576 ml  Net -726.22 ml   Weight change: -3 lb 10 oz (-1.643 kg) Filed Weights   11/04/11 0833 11/05/11 0458 11/06/11 0600  Weight: 165 lb (74.844 kg) 162 lb 11.2 oz (73.8 kg) 161 lb 6 oz (73.2 kg)   PHYSICAL EXAM General: Well developed, well nourished, female in no acute distress. Head: Normocephalic, atraumatic.  Neck: Supple without bruits, JVD not elevated. Lungs:  Resp regular and unlabored, CTA bilat except a few rales. Heart: RRR, S1, S2, no S3, S4, soft systolic murmur. Abdomen: Soft, non-tender, non-distended, BS + x 4.  Extremities: No clubbing, cyanosis, no edema.  Neuro: Alert and oriented X 3. Moves all extremities spontaneously. Psych: Normal affect.  LABS: CBC: Basename 11/06/11 0528 11/05/11 0307 11/04/11 0855  WBC 18.5* 16.2* --  NEUTROABS -- -- 6.4  HGB 14.3 14.5 --  HCT 43.9 43.1 --  MCV 96.3 95.1 --  PLT 341 352 --   INR: Basename 11/06/11 0528  INR 1.80*   Basic Metabolic Panel: Basename 11/05/11 0307 11/04/11 0855  NA 140 135  K 4.9 3.4*  CL 100 97  CO2 30 28  GLUCOSE 264* 182*  BUN 19 21  CREATININE 0.60 0.64  CALCIUM 10.2 10.4  MG -- --  PHOS -- --   Liver Function Tests: [facility] 11/05/11 0307  AST 19  ALT 18  ALKPHOS 84  BILITOT 0.3  PROT 6.7  ALBUMIN 3.3*    Cardiac Enzymes: Basename 11/05/11 0307 11/04/11 2046 11/04/11 1449  CKTOTAL 42 50 54  CKMB 2.7 3.2 3.0  CKMBINDEX -- -- --  TROPONINI 3.65* 3.53* 3.38*   Fasting Lipid Panel: Basename 11/05/11 0307  CHOL 257*  HDL 37*  LDLCALC UNABLE TO CALCULATE IF TRIGLYCERIDE OVER 400 mg/dL  TRIG 161*  CHOLHDL 6.9  LDLDIRECT --   Thyroid Function Tests: Basename 11/04/11 1449  TSH 1.034  T4TOTAL --  T3FREE --  THYROIDAB --   TELE:   Atrial fib, rate generally well-controlled   ECG: 06-Nov-2011 07:15:14 Atrial fibrillation Abnormal QRS-T angle, consider primary T wave abnormality Vent. rate 67 BPM PR interval * ms QRS duration 88 ms QT/QTc 382/403 ms P-R-T axes * 76 -16  Radiology/Studies: Dg Chest Portable 1 View 11/04/2011  *RADIOLOGY REPORT*  Clinical Data: Chest pain.  Pancreatic neoplasm.  PORTABLE CHEST - 1 VIEW  Comparison: 11/15/2011most recent.  Multiple priors.  Findings: Cardiomegaly.  Prior CABG.  No infiltrates or failure. No pulmonary nodules are seen.  Bones are demineralized.  Similar appearance to priors.  IMPRESSION: Cardiomegaly, no active disease.  Original Report Authenticated By: Elsie Stain, M.D.    Current Medications:  . amLODipine  5 mg Oral Daily  . aspirin  81 mg Oral Daily  . colchicine  0.6 mg Oral Daily  . febuxostat  40 mg Oral Daily  . gabapentin  300 mg Oral TID  . insulin aspart  14 Units Subcutaneous BID WC  . insulin aspart  14 Units Subcutaneous Q supper  . insulin aspart  4-14 Units Subcutaneous TID AC  . insulin glargine  35 Units Subcutaneous QHS  . insulin glargine  45 Units Subcutaneous Daily  . irbesartan  150 mg Oral Daily  . magnesium oxide  400 mg Oral Daily  . metoprolol tartrate  12.5 mg Oral TID  . omega-3 acid ethyl esters  1 g Oral Daily  . prenatal multivitamin  1 tablet Oral Daily  . sodium chloride  3 mL Intravenous Q12H  . Vitamin D (Ergocalciferol)  50,000 Units Oral Q7 days    ASSESSMENT AND PLAN: Patient  Active Hospital Problem List: Non-ST elevation myocardial infarction (NSTEMI), initial episode of care (11/04/2011) Assessment: stable. No pain. Plan: start heparin, continue ASA, pending warfarin coming down plan cath study - MD advise on today, will recheck INR at 1 pm.  IDDM (07/09/2008) Assessment: appreciate endocrine care Plan: continue   Leukocytosis (03/16/2011) Assessment: chronic Plan: monitor  Atrial fibrillation (10/18/2011) Assessment: rate is controlled Plan: continued current meds  Chronic anticoagulation (11/04/2011) Assessment: just recently started with some concern about med compliance; continue heparin till after cath Plan: continue to follow in [place] [facility] facility after discharge  Otherwise, see h&p, continue current Rx.  Signed, Theodore Demark , PA-C 10:25 AM 11/06/2011   Patient seen and examined.  Her INR is down to 1.63 but she has not been pretreated for contrast allergy.  Therefore, she will need to be deferred until Monday for her cath.  She will remain on IVUFH, and we will plan for Monday with pretreatment beginning on Sunday. Patient obviously disappointed.

## 2011-11-06 NOTE — Progress Notes (Signed)
ANTICOAGULATION CONSULT NOTE - Follow Up Consult  Pharmacy Consult for heparin when INR < 2 Indication: chest pain/ACS  Allergies  Allergen Reactions  . Ivp Dye (Iodinated Diagnostic Agents)   . Codeine   . Morphine   . Statins     Patient Measurements: Height: 5\' 4"  (162.6 cm) Weight: 161 lb 6 oz (73.2 kg) (stand up weight) IBW/kg (Calculated) : 54.7  Heparin Dosing Weight: 70.3  Vital Signs: Temp: 98.1 F (36.7 C) (07/26 1600) Temp src: Oral (07/26 1600) BP: 141/68 mmHg (07/26 1600) Pulse Rate: 87  (07/26 1600)  Labs:  Basename 11/06/11 1700 11/06/11 1320 11/06/11 0831 11/06/11 0528 11/05/11 2158 11/05/11 1440 11/05/11 0307 11/04/11 2046 11/04/11 1449 11/04/11 0855  HGB -- -- -- 14.3 -- -- 14.5 -- -- --  HCT -- -- -- 43.9 -- -- 43.1 -- -- 41.1  PLT -- -- -- 341 -- -- 352 -- -- 333  APTT -- -- -- -- -- -- -- -- -- --  LABPROT -- 19.6* -- 21.2* -- 23.4* -- -- -- --  INR -- 1.63* -- 1.80* -- 2.04* -- -- -- --  HEPARINUNFRC 0.79* -- 0.67 -- <0.10* -- -- -- -- --  CREATININE -- -- -- -- -- -- 0.60 -- -- 0.64  CKTOTAL -- -- -- -- -- -- 42 50 54 --  CKMB -- -- -- -- -- -- 2.7 3.2 3.0 --  TROPONINI -- -- -- -- -- -- 3.65* 3.53* 3.38* --    Estimated Creatinine Clearance: 55 ml/min (by C-G formula based on Cr of 0.6).   Medical History: Past Medical History  Diagnosis Date  . Other chronic pulmonary heart diseases   . HTN (hypertension)   . Coronary atherosclerosis of unspecified type of vessel, native or graft     remote CABG in 1999 with Dr. Laneta Simmers, S/P PCI with stent to LCX in 2009  . Personal history of other diseases of digestive system   . Gout, unspecified   . Unspecified hereditary and idiopathic peripheral neuropathy   . Esophageal reflux   . S/P coronary artery stent placement 06/21/07    Platinum protocol study. Patent at follow up  . Anemia   . GI bleed   . Primary pancreatic carcinoid tumor   . Primary malignant neuroendocrine tumor of pancreas  03/16/2011  . Leukocytosis 03/16/2011  . Hyperlipidemia   . CLL (chronic lymphocytic leukemia)     seeing Dr. [name]  . Pancreas disorder     prior pancreatectomy in 2011 complicated by leak requiring prolong and repeated drainage and subsequent pancreatic stent  . Dysrhythmia 11/04/2011    atrial fibrilation  . Shortness of breath   . Type I (juvenile type) diabetes mellitus without mention of complication, not stated as uncontrolled     has insulin pump    Medications:  Prescriptions prior to admission  Medication Sig Dispense Refill  . acetaminophen (TYLENOL) 325 MG tablet Take 325 mg by mouth as needed.        Marland Kitchen amLODipine (NORVASC) 10 MG tablet Take 5 mg by mouth daily. PT TAKES ONLY WHEN SHE THINKS SHE NEEDS IT      . BYSTOLIC 5 MG tablet Take 5 mg by mouth daily.       Marland Kitchen COLCRYS 0.6 MG tablet Take 0.6 mg by mouth daily. Dr Catalina Pizza instructed pt. To stop this med the end of this week and take Uloric      . febuxostat (ULORIC) 40 MG tablet Take  40 mg by mouth daily.      Marland Kitchen gabapentin (NEURONTIN) 300 MG capsule Take 300 mg by mouth 3 (three) times daily.       . insulin aspart (NOVOLOG) 100 UNIT/ML injection Inject into the skin continuous. Basal rate 1.25 U/hr midnight-6 am; 2.6 U/hr till 9 am; 3.0 U/hr till 1 pm; 3.9 U/hr till 5 pm; 4.1 U/hr till 6 pm; 4.5 U/hr till 10:30 pm; 1.7 U/hr till midnight. Total basal daily insulin 69.8 Units (per 24 hr).       . loperamide (IMODIUM) 2 MG capsule Take 2 mg by mouth as needed.        . Magnesium 250 MG TABS Take 500 mg by mouth daily.       . metoprolol tartrate (LOPRESSOR) 25 MG tablet Take 1 tablet (25 mg total) by mouth 2 (two) times daily.  60 tablet  3  . nitroGLYCERIN (NITROLINGUAL) 0.4 MG/SPRAY spray Place 1 spray under the tongue every 5 (five) minutes as needed.       Marland Kitchen olmesartan (BENICAR) 40 MG tablet Take 40 mg by mouth every morning.       . Omega-3 Fatty Acids (FISH OIL) 1000 MG CAPS Take by mouth daily.        .  polyethylene glycol (MIRALAX / GLYCOLAX) packet Take 17 g by mouth as needed.      . Prenatal Multivit-Min-Fe-FA (PRENATAL VITAMINS) 0.8 MG tablet Take 1 tablet by mouth daily.       . promethazine (PHENERGAN) 12.5 MG tablet Take 12.5 mg by mouth as needed.        . traMADol (ULTRAM) 50 MG tablet Take 50 mg by mouth daily as needed. pain      . warfarin (COUMADIN) 2.5 MG tablet Take 2.5-4 mg by mouth daily. TAKES 2.5 MG ON Sunday, Tuesday, Thursday and Saturday. Takes 4 mg on Monday, Wednesday and Fridays  1 tablet  0  . Cholecalciferol (VITAMIN D3) 50000 UNITS CAPS Take 50,000 Units by mouth every 7 (seven) days. TAKES ON THURSDAYS        Assessment: Ms. Uriostegui is a 78 yo f admitted with ACS to start heparin per pharmacy. She is on chronic Coumadin, which was held for pending cath. Heparin level now 0.79 on heparin at 950 units/hr, no bleeding noted by nurse who confirms turning rate down to 950 units/hr earlier today.  Apparent accumulation in this 78 year old female.   Goal of Therapy:  Heparin level 0.3-0.7 units/ml Monitor platelets by anticoagulation protocol: Yes   Plan:  1. Decrease IV heparin rate to 850 units/hr. 2. Repeat heparin level in 6 hrs. 3. Continue daily heparin level and CBC.   Thank you for allowing pharmacy to be a part of this patients care team.  Lovenia Kim Pharm.D., BCPS Clinical Pharmacist 11/06/2011 5:44 PM Pager: [phone] Phone: [phone]

## 2011-11-07 DIAGNOSIS — I2510 Atherosclerotic heart disease of native coronary artery without angina pectoris: Secondary | ICD-10-CM

## 2011-11-07 LAB — CBC
HCT: 42.3 % (ref 36.0–46.0)
Hemoglobin: 14.3 g/dL (ref 12.0–15.0)
MCH: 32.4 pg (ref 26.0–34.0)
MCHC: 33.8 g/dL (ref 30.0–36.0)
MCV: 95.9 fL (ref 78.0–100.0)
RDW: 15.6 % — ABNORMAL HIGH (ref 11.5–15.5)

## 2011-11-07 LAB — PROTIME-INR
INR: 1.5 — ABNORMAL HIGH (ref 0.00–1.49)
Prothrombin Time: 18.4 seconds — ABNORMAL HIGH (ref 11.6–15.2)

## 2011-11-07 LAB — HEPARIN LEVEL (UNFRACTIONATED): Heparin Unfractionated: 0.39 IU/mL (ref 0.30–0.70)

## 2011-11-07 LAB — GLUCOSE, CAPILLARY
Glucose-Capillary: 198 mg/dL — ABNORMAL HIGH (ref 70–99)
Glucose-Capillary: 165 mg/dL — ABNORMAL HIGH (ref 70–99)
Glucose-Capillary: 158 mg/dL — ABNORMAL HIGH (ref 70–99)

## 2011-11-07 MED ORDER — METOPROLOL TARTRATE 25 MG PO TABS
25.0000 mg | ORAL_TABLET | Freq: Three times a day (TID) | ORAL | Status: DC
  Administered 2011-11-07 – 2011-11-14 (×17): 25 mg via ORAL
  Filled 2011-11-07 (×25): qty 1

## 2011-11-07 MED ORDER — INSULIN GLARGINE 100 UNIT/ML ~~LOC~~ SOLN: 40.0000 [IU] | Freq: Every day | SUBCUTANEOUS | Status: DC

## 2011-11-07 MED ORDER — COLESEVELAM HCL 625 MG PO TABS
625.0000 mg | ORAL_TABLET | Freq: Three times a day (TID) | ORAL | Status: DC
  Administered 2011-11-07 – 2011-11-14 (×19): 625 mg via ORAL
  Filled 2011-11-07 (×23): qty 1

## 2011-11-07 MED ORDER — EZETIMIBE 10 MG PO TABS
10.0000 mg | ORAL_TABLET | Freq: Every day | ORAL | Status: DC
  Administered 2011-11-07 – 2011-11-14 (×7): 10 mg via ORAL
  Filled 2011-11-07 (×8): qty 1

## 2011-11-07 NOTE — Progress Notes (Signed)
ANTICOAGULATION CONSULT NOTE - Follow Up Consult  Pharmacy Consult for heparin when INR < 2 Indication: chest pain/ACS  Allergies  Allergen Reactions  . Ivp Dye (Iodinated Diagnostic Agents)   . Codeine   . Morphine   . Statins     Patient Measurements: Height: 5\' 4"  (162.6 cm) Weight: 161 lb 6 oz (73.2 kg) (stand up weight) IBW/kg (Calculated) : 54.7  Heparin Dosing Weight: 70.3  Vital Signs: Temp: 97.9 F (36.6 C) (07/26 2337) Temp src: Oral (07/26 2337) BP: 117/57 mmHg (07/26 1940) Pulse Rate: 87  (07/26 1600)  Labs:  Basename 11/06/11 2359 11/06/11 1700 11/06/11 1320 11/06/11 0831 11/06/11 0528 11/05/11 1440 11/05/11 0307 11/04/11 2046 11/04/11 1449 11/04/11 0855  HGB -- -- -- -- 14.3 -- 14.5 -- -- --  HCT -- -- -- -- 43.9 -- 43.1 -- -- 41.1  PLT -- -- -- -- 341 -- 352 -- -- 333  APTT -- -- -- -- -- -- -- -- -- --  LABPROT -- -- 19.6* -- 21.2* 23.4* -- -- -- --  INR -- -- 1.63* -- 1.80* 2.04* -- -- -- --  HEPARINUNFRC 0.52 0.79* -- 0.67 -- -- -- -- -- --  CREATININE -- -- -- -- -- -- 0.60 -- -- 0.64  CKTOTAL -- -- -- -- -- -- 42 50 54 --  CKMB -- -- -- -- -- -- 2.7 3.2 3.0 --  TROPONINI -- -- -- -- -- -- 3.65* 3.53* 3.38* --    Estimated Creatinine Clearance: 55 ml/min (by C-G formula based on Cr of 0.6).   Medical History: Past Medical History  Diagnosis Date  . Other chronic pulmonary heart diseases   . HTN (hypertension)   . Coronary atherosclerosis of unspecified type of vessel, native or graft     remote CABG in 1999 with Dr. Laneta Simmers, S/P PCI with stent to LCX in 2009  . Personal history of other diseases of digestive system   . Gout, unspecified   . Unspecified hereditary and idiopathic peripheral neuropathy   . Esophageal reflux   . S/P coronary artery stent placement 06/21/07    Platinum protocol study. Patent at follow up  . Anemia   . GI bleed   . Primary pancreatic carcinoid tumor   . Primary malignant neuroendocrine tumor of pancreas  03/16/2011  . Leukocytosis 03/16/2011  . Hyperlipidemia   . CLL (chronic lymphocytic leukemia)     seeing Dr. [name]  . Pancreas disorder     prior pancreatectomy in 2011 complicated by leak requiring prolong and repeated drainage and subsequent pancreatic stent  . Dysrhythmia 11/04/2011    atrial fibrilation  . Shortness of breath   . Type I (juvenile type) diabetes mellitus without mention of complication, not stated as uncontrolled     has insulin pump    Medications:  Prescriptions prior to admission  Medication Sig Dispense Refill  . acetaminophen (TYLENOL) 325 MG tablet Take 325 mg by mouth as needed.        Marland Kitchen amLODipine (NORVASC) 10 MG tablet Take 5 mg by mouth daily. PT TAKES ONLY WHEN SHE THINKS SHE NEEDS IT      . BYSTOLIC 5 MG tablet Take 5 mg by mouth daily.       Marland Kitchen COLCRYS 0.6 MG tablet Take 0.6 mg by mouth daily. Dr Catalina Pizza instructed pt. To stop this med the end of this week and take Uloric      . febuxostat (ULORIC) 40 MG tablet Take  40 mg by mouth daily.      Marland Kitchen gabapentin (NEURONTIN) 300 MG capsule Take 300 mg by mouth 3 (three) times daily.       . insulin aspart (NOVOLOG) 100 UNIT/ML injection Inject into the skin continuous. Basal rate 1.25 U/hr midnight-6 am; 2.6 U/hr till 9 am; 3.0 U/hr till 1 pm; 3.9 U/hr till 5 pm; 4.1 U/hr till 6 pm; 4.5 U/hr till 10:30 pm; 1.7 U/hr till midnight. Total basal daily insulin 69.8 Units (per 24 hr).       . loperamide (IMODIUM) 2 MG capsule Take 2 mg by mouth as needed.        . Magnesium 250 MG TABS Take 500 mg by mouth daily.       . metoprolol tartrate (LOPRESSOR) 25 MG tablet Take 1 tablet (25 mg total) by mouth 2 (two) times daily.  60 tablet  3  . nitroGLYCERIN (NITROLINGUAL) 0.4 MG/SPRAY spray Place 1 spray under the tongue every 5 (five) minutes as needed.       Marland Kitchen olmesartan (BENICAR) 40 MG tablet Take 40 mg by mouth every morning.       . Omega-3 Fatty Acids (FISH OIL) 1000 MG CAPS Take by mouth daily.        .  polyethylene glycol (MIRALAX / GLYCOLAX) packet Take 17 g by mouth as needed.      . Prenatal Multivit-Min-Fe-FA (PRENATAL VITAMINS) 0.8 MG tablet Take 1 tablet by mouth daily.       . promethazine (PHENERGAN) 12.5 MG tablet Take 12.5 mg by mouth as needed.        . traMADol (ULTRAM) 50 MG tablet Take 50 mg by mouth daily as needed. pain      . warfarin (COUMADIN) 2.5 MG tablet Take 2.5-4 mg by mouth daily. TAKES 2.5 MG ON Sunday, Tuesday, Thursday and Saturday. Takes 4 mg on Monday, Wednesday and Fridays  1 tablet  0  . Cholecalciferol (VITAMIN D3) 50000 UNITS CAPS Take 50,000 Units by mouth every 7 (seven) days. TAKES ON THURSDAYS        Assessment: Ms. Zhan is a 78 yo f admitted with ACS on IV heparin per pharmacy. She is on chronic Coumadin, which was held for pending cath. Heparin level (0.52) is at-goal on 850 units/hr.   Goal of Therapy:  Heparin level 0.3-0.7 units/ml Monitor platelets by anticoagulation protocol: Yes   Plan:  1. Continue IV heparin at 850 units/hr.  2. Follow-up AM HL to confirm dosing.   Lorre Munroe, PharmD 11/07/2011 1:19 AM

## 2011-11-07 NOTE — Progress Notes (Signed)
Subjective: CBG (last 3)   Basename 11/07/11 0728 11/06/11 2155 11/06/11 1648  GLUCAP 198* 184* 203*   Blood sugar readings have been relatively stable as above with slight increase in insulin yesterday. No hypoglycemia. Has been eating very little because of being n.p.o. for procedure. She has no complaints today and vital signs quite stable.  Objective: Vital signs in last 24 hours: Temp:  [97.6 F (36.4 C)-98.4 F (36.9 C)] 98.4 F (36.9 C) (07/27 0726) Pulse Rate:  [71-87] 87  (07/26 1600) Resp:  [18-22] 22  (07/26 2337) BP: (117-141)/(51-68) 129/61 mmHg (07/27 0726) SpO2:  [95 %-98 %] 98 % (07/27 0800) Weight:  [73.9 kg (162 lb 14.7 oz)] 73.9 kg (162 lb 14.7 oz) (07/27 0500) Weight change: 0.7 kg (1 lb 8.7 oz) Last BM Date: 11/05/11  Intake/Output from previous day: 07/26 0701 - 07/27 0700 In: 1582.8 [P.O.:1200; I.V.:382.8] Out: 960 [Urine:960] Intake/Output this shift: Total I/O In: 37 [I.V.:37] Out: -   General appearance: alert and no distress  Lab Results:  Basename 11/07/11 0519 11/06/11 0528  WBC 16.1* 18.5*  HGB 14.3 14.3  HCT 42.3 43.9  PLT 286 341   BMET  Basename 11/05/11 0307  NA 140  K 4.9  CL 100  CO2 30  GLUCOSE 264*  BUN 19  CREATININE 0.60  CALCIUM 10.2    Studies/Results: No results found.  Medications: I have reviewed the patient's current medications.  Assessment/Plan: Her fasting blood sugars are still around 200, will increase her Lantus by 5 units twice a day and continue same doses of mealtime insulin and correction.  LOS: 3 days   [place] Throckmorton 11/07/2011, 10:44 AM

## 2011-11-07 NOTE — Progress Notes (Signed)
[name]  78 y.o.  female  Subjective: Patient is perturbed about having to stay in hospital over the weekend, but otherwise is doing well. She experienced a brief episode of chest discomfort this morning, but has generally done well on bed rest. She is being treated with intravenous heparin but no nitroglycerin.  Allergy: Ivp dye; Codeine; Morphine; and Statins  Objective: Vital signs in last 24 hours: Temp:  [97.6 F (36.4 C)-98.4 F (36.9 C)] 97.9 F (36.6 C) (07/27 1142) Pulse Rate:  [71-87] 87  (07/26 1600) Resp:  [18-22] 22  (07/26 2337) BP: (117-141)/(51-68) 129/61 mmHg (07/27 0726) SpO2:  [95 %-98 %] 98 % (07/27 0800) Weight:  [73.9 kg (162 lb 14.7 oz)] 73.9 kg (162 lb 14.7 oz) (07/27 0500)  73.9 kg (162 lb 14.7 oz) Body mass index is 27.97 kg/(m^2).  Weight change: 0.7 kg (1 lb 8.7 oz) Last BM Date: 11/05/11  Intake/Output from previous day: 07/26 0701 - 07/27 0700 In: 1582.8 [P.O.:1200; I.V.:382.8] Out: 960 [Urine:960]  General- Well developed; no acute distress ; overweight; present Neck- No JVD, no carotid bruits Lungs- clear lung fields; normal I:E ratio Cardiovascular- normal PMI; normal S1 and S2; fourth heart sound present Abdomen- normal bowel sounds; soft and non-tender without masses or organomegaly Skin- Warm, no significant lesions Extremities- Nl distal pulses; no edema  Lab Results: Cardiac Markers:   Basename 11/05/11 0307 11/04/11 2046  TROPONINI 3.65* 3.53*   CBC:   Basename 11/07/11 0519 11/06/11 0528  WBC 16.1* 18.5*  HGB 14.3 14.3  HCT 42.3 43.9  PLT 286 341   BMET:  Basename 11/05/11 0307  NA 140  K 4.9  CL 100  CO2 30  GLUCOSE 264*  BUN 19  CREATININE 0.60  CALCIUM 10.2   Hepatic Function:   Basename 11/05/11 0307  PROT 6.7  ALBUMIN 3.3*  AST 19  ALT 18  ALKPHOS 84  BILITOT 0.3  BILIDIR --  IBILI --   GFR:  Estimated Creatinine Clearance: 55.3 ml/min (by C-G formula based on Cr of 0.6). Lipids:   Basename  11/05/11 0307  CHOL 257*  TRIG 567*  HDL 37*   EKG:  Initial tracing showed atrial fibrillation with a controlled ventricular response and inferolateral ST-T wave abnormalities.  Heart rate has decreased and ST-T wave abnormalities improved on subsequent tracings  Imaging: Imaging results have been reviewed  Medications: I have reviewed the patient's current medications.  Infusions:     . heparin 850 Units/hr (11/07/11 0800)    Principal Problem:  *Non-ST elevation myocardial infarction (NSTEMI), initial episode of care Active Problems:  IDDM  HYPERCHOLESTEROLEMIA  IIA  HYPERTENSION  Leukocytosis  Atrial fibrillation  Chronic anticoagulation   Assessment/Plan: Atrial fibrillation: Stable arrhythmia with controlled heart rate and adequate anticoagulation.  Acute coronary syndrome: Cardiac markers are abnormalities with accompanying EKG changes. Cardiac catheterization planned for Monday.  If she continues to experience chest discomfort,  intravenous nitroglycerin will be added to her regime.  Hypertension: Systolic and occasional diastolic pressures were significantly elevated early in his hospital course, but  are now controlled with current medication.  Hyperlipidemia: Previous adverse reactions to statins; non-statin agents will be added to medical regime.  LOS: 3 days   [place] Bing 11/07/2011, 11:53 AM

## 2011-11-07 NOTE — Progress Notes (Addendum)
ANTICOAGULATION CONSULT NOTE - Follow Up Consult  Pharmacy Consult for heparin (INR < 2) Indication: chest pain/ACS  Allergies  Allergen Reactions  . Ivp Dye (Iodinated Diagnostic Agents)   . Codeine   . Morphine   . Statins     Patient Measurements: Height: 5\' 4"  (162.6 cm) Weight: 162 lb 14.7 oz (73.9 kg) IBW/kg (Calculated) : 54.7  Heparin Dosing Weight: 70.3  Vital Signs: Temp: 98.4 F (36.9 C) (07/27 0726) Temp src: Oral (07/27 0726) BP: 129/61 mmHg (07/27 0726)  Labs:  Basename 11/07/11 0519 11/06/11 2359 11/06/11 1700 11/06/11 1320 11/06/11 0528 11/05/11 0307 11/04/11 2046 11/04/11 1449  HGB 14.3 -- -- -- 14.3 -- -- --  HCT 42.3 -- -- -- 43.9 43.1 -- --  PLT 286 -- -- -- 341 352 -- --  APTT -- -- -- -- -- -- -- --  LABPROT 18.4* -- -- 19.6* 21.2* -- -- --  INR 1.50* -- -- 1.63* 1.80* -- -- --  HEPARINUNFRC 0.39 0.52 0.79* -- -- -- -- --  CREATININE -- -- -- -- -- 0.60 -- --  CKTOTAL -- -- -- -- -- 42 50 54  CKMB -- -- -- -- -- 2.7 3.2 3.0  TROPONINI -- -- -- -- -- 3.65* 3.53* 3.38*    Estimated Creatinine Clearance: 55.3 ml/min (by C-G formula based on Cr of 0.6).   Medical History: Past Medical History  Diagnosis Date  . Other chronic pulmonary heart diseases   . HTN (hypertension)   . Coronary atherosclerosis of unspecified type of vessel, native or graft     remote CABG in 1999 with Dr. Laneta Simmers, S/P PCI with stent to LCX in 2009  . Personal history of other diseases of digestive system   . Gout, unspecified   . Unspecified hereditary and idiopathic peripheral neuropathy   . Esophageal reflux   . S/P coronary artery stent placement 06/21/07    Platinum protocol study. Patent at follow up  . Anemia   . GI bleed   . Primary pancreatic carcinoid tumor   . 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 Leukocytosis 03/16/2011  . Hyperlipidemia   . CLL (chronic lymphocytic leukemia)     seeing Dr. [name]  . Pancreas disorder    prior pancreatectomy in 2011 complicated by leak requiring prolong and repeated drainage and subsequent pancreatic stent  . Dysrhythmia 11/04/2011    atrial fibrilation  . Shortness of breath   . Type I (juvenile type) diabetes mellitus without mention of complication, not stated as uncontrolled     has insulin pump    Medications:  Prescriptions prior to admission  Medication Sig Dispense Refill  . acetaminophen (TYLENOL) 325 MG tablet Take 325 mg by mouth as needed.        Marland Kitchen amLODipine (NORVASC) 10 MG tablet Take 5 mg by mouth daily. PT TAKES ONLY WHEN SHE THINKS SHE NEEDS IT      . BYSTOLIC 5 MG tablet Take 5 mg by mouth daily.       Marland Kitchen COLCRYS 0.6 MG tablet Take 0.6 mg by mouth daily. Dr Catalina Pizza instructed pt. To stop this med the end of this week and take Uloric      . febuxostat (ULORIC) 40 MG tablet Take 40 mg by mouth daily.      Marland Kitchen gabapentin (NEURONTIN) 300 MG capsule Take 300 mg by mouth 3 (three) times daily.       . insulin aspart (NOVOLOG) 100 UNIT/ML injection  Inject into the skin continuous. Basal rate 1.25 U/hr midnight-6 am; 2.6 U/hr till 9 am; 3.0 U/hr till 1 pm; 3.9 U/hr till 5 pm; 4.1 U/hr till 6 pm; 4.5 U/hr till 10:30 pm; 1.7 U/hr till midnight. Total basal daily insulin 69.8 Units (per 24 hr).       . loperamide (IMODIUM) 2 MG capsule Take 2 mg by mouth as needed.        . Magnesium 250 MG TABS Take 500 mg by mouth daily.       . metoprolol tartrate (LOPRESSOR) 25 MG tablet Take 1 tablet (25 mg total) by mouth 2 (two) times daily.  60 tablet  3  . nitroGLYCERIN (NITROLINGUAL) 0.4 MG/SPRAY spray Place 1 spray under the tongue every 5 (five) minutes as needed.       Marland Kitchen olmesartan (BENICAR) 40 MG tablet Take 40 mg by mouth every morning.       . Omega-3 Fatty Acids (FISH OIL) 1000 MG CAPS Take by mouth daily.        . polyethylene glycol (MIRALAX / GLYCOLAX) packet Take 17 g by mouth as needed.      . Prenatal Multivit-Min-Fe-FA (PRENATAL VITAMINS) 0.8 MG tablet Take 1  tablet by mouth daily.       . promethazine (PHENERGAN) 12.5 MG tablet Take 12.5 mg by mouth as needed.        . traMADol (ULTRAM) 50 MG tablet Take 50 mg by mouth daily as needed. pain      . warfarin (COUMADIN) 2.5 MG tablet Take 2.5-4 mg by mouth daily. TAKES 2.5 MG ON Sunday, Tuesday, Thursday and Saturday. Takes 4 mg on Monday, Wednesday and Fridays  1 tablet  0  . Cholecalciferol (VITAMIN D3) 50000 UNITS CAPS Take 50,000 Units by mouth every 7 (seven) days. TAKES ON THURSDAYS        Assessment: Ms. Gradilla is a 78 yo f admitted with ACS on IV heparin per pharmacy. She is on chronic Coumadin, which was held for pending cath. Heparin level (0.39) is at-goal on 850 units/hr.  H/H stable, platelet ct with slight decrease this AM.  Goal of Therapy:  Heparin level 0.3-0.7 units/ml Monitor platelets by anticoagulation protocol: Yes   Plan:  1. Continue IV heparin at 850 units/hr.  2. Continue daily heparin level and CBC. 3. Watch platelet count for now.  Reece Leader, Pharm D 11/07/2011 10:57 AM

## 2011-11-08 LAB — PROTIME-INR
INR: 1.27 (ref 0.00–1.49)
Prothrombin Time: 16.2 seconds — ABNORMAL HIGH (ref 11.6–15.2)

## 2011-11-08 LAB — GLUCOSE, CAPILLARY
Glucose-Capillary: 186 mg/dL — ABNORMAL HIGH (ref 70–99)
Glucose-Capillary: 161 mg/dL — ABNORMAL HIGH (ref 70–99)

## 2011-11-08 LAB — CBC
Hemoglobin: 14.4 g/dL (ref 12.0–15.0)
MCH: 32.5 pg (ref 26.0–34.0)
MCHC: 33.6 g/dL (ref 30.0–36.0)
RDW: 15.5 % (ref 11.5–15.5)

## 2011-11-08 MED ORDER — DIPHENHYDRAMINE HCL 50 MG/ML IJ SOLN
25.0000 mg | INTRAMUSCULAR | Status: AC
  Administered 2011-11-09: 25 mg via INTRAVENOUS
  Filled 2011-11-08: qty 1

## 2011-11-08 MED ORDER — PREDNISONE 10 MG PO TABS
60.0000 mg | ORAL_TABLET | ORAL | Status: AC
  Administered 2011-11-09: 60 mg via ORAL
  Filled 2011-11-08: qty 1

## 2011-11-08 MED ORDER — FAMOTIDINE 20 MG PO TABS
20.0000 mg | ORAL_TABLET | ORAL | Status: AC
  Administered 2011-11-08: 20 mg via ORAL
  Filled 2011-11-08: qty 1

## 2011-11-08 MED ORDER — SODIUM CHLORIDE 0.9 % IV SOLN: 1.0000 mL/kg/h | INTRAVENOUS | Status: DC

## 2011-11-08 MED ORDER — SODIUM CHLORIDE 0.9 % IJ SOLN: 3.0000 mL | Freq: Two times a day (BID) | INTRAMUSCULAR | Status: DC

## 2011-11-08 MED ORDER — SODIUM CHLORIDE 0.9 % IV SOLN: 250.0000 mL | INTRAVENOUS | Status: DC | PRN

## 2011-11-08 MED ORDER — ASPIRIN 81 MG PO CHEW
324.0000 mg | CHEWABLE_TABLET | ORAL | Status: AC
  Administered 2011-11-09: 324 mg via ORAL

## 2011-11-08 MED ORDER — SODIUM CHLORIDE 0.9 % IJ SOLN: 3.0000 mL | INTRAMUSCULAR | Status: DC | PRN

## 2011-11-08 MED ORDER — PREDNISONE 50 MG PO TABS
60.0000 mg | ORAL_TABLET | ORAL | Status: AC
  Administered 2011-11-08: 60 mg via ORAL
  Filled 2011-11-08: qty 1

## 2011-11-08 NOTE — Progress Notes (Signed)
ANTICOAGULATION CONSULT NOTE - Follow Up Consult  Pharmacy Consult for heparin (INR < 2) Indication: chest pain/ACS  Allergies  Allergen Reactions  . Ivp Dye (Iodinated Diagnostic Agents)   . Codeine   . Morphine   . Statins     Patient Measurements: Height: 5\' 4"  (162.6 cm) Weight: 162 lb 11.2 oz (73.8 kg) IBW/kg (Calculated) : 54.7  Heparin Dosing Weight: 70.3  Vital Signs: Temp: 97.6 F (36.4 C) (07/28 0737) Temp src: Oral (07/28 0737) BP: 145/43 mmHg (07/28 0737) Pulse Rate: 57  (07/28 0315)  Labs:  [name] 11/08/11 0555 11/07/11 0519 11/06/11 2359 11/06/11 1320 11/06/11 0528  HGB 14.4 14.3 -- -- --  HCT 42.9 42.3 -- -- 43.9  PLT 305 286 -- -- 341  APTT -- -- -- -- --  LABPROT 16.2* 18.4* -- 19.6* --  INR 1.27 1.50* -- 1.63* --  HEPARINUNFRC 0.41 0.39 0.52 -- --  CREATININE -- -- -- -- --  CKTOTAL -- -- -- -- --  CKMB -- -- -- -- --  TROPONINI -- -- -- -- --    Estimated Creatinine Clearance: 55.2 ml/min (by C-G formula based on Cr of 0.6).   Medical History: Past Medical History  Diagnosis Date  . Other chronic pulmonary heart diseases   . HTN (hypertension)   . Coronary atherosclerosis of unspecified type of vessel, native or graft     remote CABG in 1999 with Dr. Laneta Simmers, S/P PCI with stent to LCX in 2009  . Personal history of other diseases of digestive system   . Gout, unspecified   . Unspecified hereditary and idiopathic peripheral neuropathy   . Esophageal reflux   . S/P coronary artery stent placement 06/21/07    Platinum protocol study. Patent at follow up  . Anemia   . GI bleed   . Primary pancreatic carcinoid tumor   . 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 Leukocytosis 03/16/2011  . Hyperlipidemia   . CLL (chronic lymphocytic leukemia)     seeing Dr. [name]  . Pancreas disorder     prior pancreatectomy in 2011 complicated by leak requiring prolong and repeated drainage and subsequent pancreatic stent  .  Dysrhythmia 11/04/2011    atrial fibrilation  . Shortness of breath   . Type I (juvenile type) diabetes mellitus without mention of complication, not stated as uncontrolled     has insulin pump    Medications:     . heparin 850 Units/hr (11/08/11 0234)    Assessment: [name] is a 78 yo F admitted with ACS on IV heparin per pharmacy. She is on chronic Coumadin, which was held for pending cath. Heparin level (0.41) is at-goal on 850 units/hr.  CBC stable today.  Goal of Therapy:  Heparin level 0.3-0.7 units/ml Monitor platelets by anticoagulation protocol: Yes   Plan:  1. Continue IV heparin at 850 units/hr.  2. Continue daily heparin level and CBC. 3. Watch platelet count for now.  Reece Leader, Pharm D 11/08/2011 11:14 AM

## 2011-11-08 NOTE — Progress Notes (Signed)
[name]  78 y.o.  female  Subjective: Mood is much improved today, but still seeks discharge ASAP.  No further chest discomfort or dyspnea.    Allergy: Ivp dye; Codeine; Morphine; and Statins  Objective: Vital signs in last 24 hours: Temp:  [97.6 F (36.4 C)-98.1 F (36.7 C)] 97.6 F (36.4 C) (07/28 0737) Pulse Rate:  [57] 57  (07/28 0315) Resp:  [15-16] 15  (07/28 0737) BP: (102-145)/(43-67) 145/43 mmHg (07/28 0737) SpO2:  [94 %-96 %] 96 % (07/28 0737) Weight:  [73.8 kg (162 lb 11.2 oz)] 73.8 kg (162 lb 11.2 oz) (07/28 0541)  73.8 kg (162 lb 11.2 oz) Body mass index is 27.93 kg/(m^2).  Weight change: -0.1 kg (-3.5 oz) Last BM Date: 11/05/11  Intake/Output from previous day: 07/27 0701 - 07/28 0700 In: 544 [P.O.:100; I.V.:444] Out: 1075 [Urine:1075]  General- Well developed; no acute distress ; overweight;  Neck- minimal JVD, no carotid bruits Lungs- clear lung fields; normal I:E ratio Cardiovascular- normal PMI; normal S1 and S2; fourth heart sound present Abdomen- normal bowel sounds; soft and non-tender without masses or organomegaly Skin- Warm, no significant lesions Extremities- Nl distal pulses; no edema   Basename 11/08/11 0555 11/07/11 0519  WBC 15.3* 16.1*  HGB 14.4 14.3  HCT 42.9 42.3  PLT 305 286   EKG: 11/06/2011-atrial fibrillation with a controlled ventricular response and nonspecific ST-T wave abnormalities.    Imaging: Imaging results have been reviewed  Medications: I have reviewed the patient's current medications.  Infusions:      . heparin 850 Units/hr (11/08/11 0234)    Assessment/Plan: Atrial fibrillation: Stable arrhythmia with controlled heart rate and adequate anticoagulation.  Acute coronary syndrome: Cardiac markers are abnormalities with accompanying EKG changes. Cardiac catheterization planned for Monday.  Medication to reduce the likelihood of allergic reaction to contrast is ordered.  Hypertension: Systolic and occasional  diastolic pressures were significantly elevated early in his hospital course, but  are now controlled with systolic BP approximately 130 and diastolic 60.  Diabetes:  CBGs 110-186 representing adequate control.  LOS: 4 days   Nikoli Nasser 11/08/2011, 11:25 AM      

## 2011-11-09 ENCOUNTER — Encounter (HOSPITAL_COMMUNITY)
Admission: EM | Disposition: A | Discharge status: Home Health | Payer: Self-pay | Source: Home / Self Care | Attending: Internal Medicine

## 2011-11-09 ENCOUNTER — Ambulatory Visit: Payer: Medicare Other | Admitting: Cardiology

## 2011-11-09 DIAGNOSIS — I214 Non-ST elevation (NSTEMI) myocardial infarction: Secondary | ICD-10-CM

## 2011-11-09 DIAGNOSIS — I25810 Atherosclerosis of coronary artery bypass graft(s) without angina pectoris: Secondary | ICD-10-CM

## 2011-11-09 HISTORY — PX: PERCUTANEOUS CORONARY STENT INTERVENTION (PCI-S): SHX5485

## 2011-11-09 HISTORY — PX: LEFT HEART CATHETERIZATION WITH CORONARY/GRAFT ANGIOGRAM: SHX5450

## 2011-11-09 LAB — BASIC METABOLIC PANEL
CO2: 26 mEq/L (ref 19–32)
Calcium: 10 mg/dL (ref 8.4–10.5)
Creatinine, Ser: 0.67 mg/dL (ref 0.50–1.10)
Glucose, Bld: 351 mg/dL — ABNORMAL HIGH (ref 70–99)

## 2011-11-09 LAB — CBC
HCT: 43.8 % (ref 36.0–46.0)
MCHC: 33.6 g/dL (ref 30.0–36.0)
MCV: 95.4 fL (ref 78.0–100.0)
Platelets: 276 10*3/uL (ref 150–400)
RDW: 15.3 % (ref 11.5–15.5)

## 2011-11-09 LAB — GLUCOSE, CAPILLARY
Glucose-Capillary: 321 mg/dL — ABNORMAL HIGH (ref 70–99)
Glucose-Capillary: 268 mg/dL — ABNORMAL HIGH (ref 70–99)
Glucose-Capillary: 198 mg/dL — ABNORMAL HIGH (ref 70–99)
Glucose-Capillary: 224 mg/dL — ABNORMAL HIGH (ref 70–99)

## 2011-11-09 LAB — PROTIME-INR: INR: 1.07 (ref 0.00–1.49)

## 2011-11-09 SURGERY — LEFT HEART CATHETERIZATION WITH CORONARY/GRAFT ANGIOGRAM
Anesthesia: LOCAL

## 2011-11-09 MED ORDER — CLOPIDOGREL BISULFATE 300 MG PO TABS
ORAL_TABLET | ORAL | Status: AC
  Filled 2011-11-09: qty 2

## 2011-11-09 MED ORDER — INSULIN GLARGINE 100 UNIT/ML ~~LOC~~ SOLN
35.0000 [IU] | Freq: Every day | SUBCUTANEOUS | Status: DC
  Administered 2011-11-09 – 2011-11-10 (×2): 35 [IU] via SUBCUTANEOUS

## 2011-11-09 MED ORDER — MIDAZOLAM HCL 2 MG/2ML IJ SOLN
INTRAMUSCULAR | Status: AC
  Filled 2011-11-09: qty 2

## 2011-11-09 MED ORDER — HALOPERIDOL LACTATE 5 MG/ML IJ SOLN
1.0000 mg | Freq: Four times a day (QID) | INTRAMUSCULAR | Status: DC | PRN
  Administered 2011-11-09: 13:00:00 2 mg via INTRAVENOUS
  Filled 2011-11-09: qty 0.4

## 2011-11-09 MED ORDER — HEPARIN (PORCINE) IN NACL 2-0.9 UNIT/ML-% IJ SOLN
INTRAMUSCULAR | Status: AC
  Filled 2011-11-09: qty 2000

## 2011-11-09 MED ORDER — LIDOCAINE HCL (PF) 1 % IJ SOLN
INTRAMUSCULAR | Status: AC
  Filled 2011-11-09: qty 30

## 2011-11-09 MED ORDER — ACETAMINOPHEN 325 MG PO TABS: 650.0000 mg | ORAL_TABLET | ORAL | Status: DC | PRN

## 2011-11-09 MED ORDER — INSULIN GLARGINE 100 UNIT/ML ~~LOC~~ SOLN
20.0000 [IU] | SUBCUTANEOUS | Status: AC
  Administered 2011-11-09: 20 [IU] via SUBCUTANEOUS

## 2011-11-09 MED ORDER — FENTANYL CITRATE 0.05 MG/ML IJ SOLN
INTRAMUSCULAR | Status: AC
  Filled 2011-11-09: qty 2

## 2011-11-09 MED ORDER — NITROGLYCERIN 0.2 MG/ML ON CALL CATH LAB
INTRAVENOUS | Status: AC
  Filled 2011-11-09: qty 1

## 2011-11-09 MED ORDER — HALOPERIDOL LACTATE 5 MG/ML IJ SOLN
1.0000 mg | INTRAMUSCULAR | Status: DC | PRN
  Administered 2011-11-09: 2 mg via INTRAVENOUS
  Filled 2011-11-09: qty 1
  Filled 2011-11-09: qty 0.4

## 2011-11-09 MED ORDER — FAMOTIDINE IN NACL 20-0.9 MG/50ML-% IV SOLN
INTRAVENOUS | Status: AC
  Filled 2011-11-09: qty 50

## 2011-11-09 MED ORDER — DIAZEPAM 5 MG PO TABS
5.0000 mg | ORAL_TABLET | ORAL | Status: AC
  Administered 2011-11-09: 5 mg via ORAL
  Filled 2011-11-09: qty 1

## 2011-11-09 MED ORDER — CLOPIDOGREL BISULFATE 75 MG PO TABS
75.0000 mg | ORAL_TABLET | Freq: Every day | ORAL | Status: DC
  Administered 2011-11-10 – 2011-11-14 (×5): 75 mg via ORAL
  Filled 2011-11-09 (×6): qty 1

## 2011-11-09 MED ORDER — INSULIN ASPART 100 UNIT/ML ~~LOC~~ SOLN: 12.0000 [IU] | Freq: Every day | SUBCUTANEOUS | Status: DC

## 2011-11-09 MED ORDER — SODIUM CHLORIDE 0.9 % IV SOLN
1.0000 mL/kg/h | INTRAVENOUS | Status: AC
  Administered 2011-11-09: 1 mL/kg/h via INTRAVENOUS

## 2011-11-09 MED ORDER — BIVALIRUDIN 250 MG IV SOLR
INTRAVENOUS | Status: AC
  Filled 2011-11-09: qty 250

## 2011-11-09 MED ORDER — LORAZEPAM 2 MG/ML IJ SOLN
0.5000 mg | Freq: Once | INTRAMUSCULAR | Status: AC
  Administered 2011-11-09: 12:00:00 0.5 mg via INTRAVENOUS
  Filled 2011-11-09: qty 1

## 2011-11-09 MED ORDER — DEXMEDETOMIDINE HCL IN NACL 200 MCG/50ML IV SOLN
0.2000 ug/kg/h | INTRAVENOUS | Status: AC
  Administered 2011-11-09: 0.7 ug/kg/h via INTRAVENOUS
  Administered 2011-11-09: 0.4 ug/kg/h via INTRAVENOUS
  Administered 2011-11-10: 0.5 ug/kg/h via INTRAVENOUS
  Filled 2011-11-09 (×11): qty 50

## 2011-11-09 MED ORDER — WARFARIN - PHARMACIST DOSING INPATIENT: Freq: Every day | Status: DC

## 2011-11-09 MED FILL — Perflutren Lipid Microsphere IV Susp 6.52 MG/ML: INTRAVENOUS | Qty: 2 | Status: AC

## 2011-11-09 NOTE — Progress Notes (Signed)
Pt confused, angry, combative trying to bite,kick punch staff in four point restraints.

## 2011-11-09 NOTE — H&P (View-Only) (Signed)
Loma Boston  78 y.o.  female  Subjective: Mood is much improved today, but still seeks discharge ASAP.  No further chest discomfort or dyspnea.    Allergy: Ivp dye; Codeine; Morphine; and Statins  Objective: Vital signs in last 24 hours: Temp:  [97.6 F (36.4 C)-98.1 F (36.7 C)] 97.6 F (36.4 C) (07/28 0737) Pulse Rate:  [57] 57  (07/28 0315) Resp:  [15-16] 15  (07/28 0737) BP: (102-145)/(43-67) 145/43 mmHg (07/28 0737) SpO2:  [94 %-96 %] 96 % (07/28 0737) Weight:  [73.8 kg (162 lb 11.2 oz)] 73.8 kg (162 lb 11.2 oz) (07/28 0541)  73.8 kg (162 lb 11.2 oz) Body mass index is 27.93 kg/(m^2).  Weight change: -0.1 kg (-3.5 oz) Last BM Date: 11/05/11  Intake/Output from previous day: 07/27 0701 - 07/28 0700 In: 544 [P.O.:100; I.V.:444] Out: 1075 [Urine:1075]  General- Well developed; no acute distress ; overweight;  Neck- minimal JVD, no carotid bruits Lungs- clear lung fields; normal I:E ratio Cardiovascular- normal PMI; normal S1 and S2; fourth heart sound present Abdomen- normal bowel sounds; soft and non-tender without masses or organomegaly Skin- Warm, no significant lesions Extremities- Nl distal pulses; no edema   Basename 11/08/11 0555 11/07/11 0519  WBC 15.3* 16.1*  HGB 14.4 14.3  HCT 42.9 42.3  PLT 305 286   EKG: 11/06/2011-atrial fibrillation with a controlled ventricular response and nonspecific ST-T wave abnormalities.    Imaging: Imaging results have been reviewed  Medications: I have reviewed the patient's current medications.  Infusions:      . heparin 850 Units/hr (11/08/11 0234)    Assessment/Plan: Atrial fibrillation: Stable arrhythmia with controlled heart rate and adequate anticoagulation.  Acute coronary syndrome: Cardiac markers are abnormalities with accompanying EKG changes. Cardiac catheterization planned for Monday.  Medication to reduce the likelihood of allergic reaction to contrast is ordered.  Hypertension: Systolic and occasional  diastolic pressures were significantly elevated early in his hospital course, but  are now controlled with systolic BP approximately 130 and diastolic 60.  Diabetes:  CBGs 110-186 representing adequate control.  LOS: 4 days   [place] Bing 11/08/2011, 11:25 AM

## 2011-11-09 NOTE — Progress Notes (Signed)
Subjective: Followup of diabetes.  Her blood glucose was 87 last night and she appears to take her Lantus insulin. Blood sugar has been in the 200-300 range this morning. Also has been in the Cath Lab for procedure and has not been taking her NovoLog. She has not had any meals because of this. Also currently has been somewhat anxious.  CBG (last 3)   Basename 11/09/11 1042 11/09/11 0725 11/08/11 2123  GLUCAP 268* 321* 87    Objective: Vital signs in last 24 hours: Temp:  [97.5 F (36.4 C)-98.5 F (36.9 C)] 97.5 F (36.4 C) (07/29 0724) Pulse Rate:  [86-88] 88  (07/29 1307) Resp:  [15-23] 23  (07/29 1307) BP: (103-157)/(50-82) 157/82 mmHg (07/29 1307) SpO2:  [96 %-97 %] 96 % (07/29 0735) Weight:  [74.1 kg (163 lb 5.8 oz)] 74.1 kg (163 lb 5.8 oz) (07/29 0400) Weight change: 0.3 kg (10.6 oz) Last BM Date: 11/08/11  Intake/Output from previous day: 07/28 0701 - 07/29 0700 In: 1003.1 [P.O.:460; I.V.:543.1] Out: 975 [Urine:975] Intake/Output this shift: Total I/O In: 238.4 [I.V.:238.4] Out: 75 [Urine:75]  General appearance: mild distress, somewhat anxious but now more cooperative  Lab Results:  [facility] 11/09/11 0545 11/08/11 0555  WBC 11.0* 15.3*  HGB 14.7 14.4  HCT 43.8 42.9  PLT 276 305   BMET  Basename 11/09/11 0910  NA 138  K 4.6  CL 100  CO2 26  GLUCOSE 351*  BUN 19  CREATININE 0.67  CALCIUM 10.0    Studies/Results: No results found.  Medications:  Scheduled:   . amLODipine  5 mg Oral Daily  . aspirin  324 mg Oral Pre-Cath  . aspirin  81 mg Oral Daily  . bivalirudin      . clopidogrel      . clopidogrel  75 mg Oral Q breakfast  . colchicine  0.6 mg Oral Daily  . colesevelam  625 mg Oral TID  . diazepam  5 mg Oral On Call  . diphenhydrAMINE  25 mg Intravenous On Call  . ezetimibe  10 mg Oral Daily  . famotidine      . famotidine  20 mg Oral Pre-Cath  . febuxostat  40 mg Oral Daily  . fentaNYL      . gabapentin  300 mg Oral TID  . heparin       . insulin aspart  14 Units Subcutaneous BID WC  . insulin aspart  14 Units Subcutaneous Q supper  . insulin aspart  4-14 Units Subcutaneous TID AC  . insulin glargine  40 Units Subcutaneous QHS  . insulin glargine  45 Units Subcutaneous Daily  . irbesartan  150 mg Oral Daily  . lidocaine      . LORazepam  0.5 mg Intravenous Once  . magnesium oxide  400 mg Oral Daily  . metoprolol tartrate  25 mg Oral TID  . midazolam      . nitroGLYCERIN      . omega-3 acid ethyl esters  1 g Oral Daily  . predniSONE  60 mg Oral Pre-Cath   Followed by  . predniSONE  60 mg Oral Pre-Cath  . prenatal multivitamin  1 tablet Oral Daily  . sodium chloride  3 mL Intravenous Q12H  . Vitamin D (Ergocalciferol)  50,000 Units Oral Q7 days  . Warfarin - Pharmacist Dosing Inpatient   Does not apply q1800  . DISCONTD: sodium chloride  3 mL Intravenous Q12H    Assessment/Plan: Since her blood sugars are high up from  not taking her Lantus last night will give an extra dose of Lantus now and reduce her dose for tonight. Also reduce her NovoLog at suppertime smaller doses for high sugar correction. We'll continue to observe her while she is in the hospital and adjust her insulin as needed. Discussed with her that Lantus should not be withheld when blood sugar is normal   LOS: 5 days   Lacey Dotson 11/09/2011, 1:27 PM

## 2011-11-09 NOTE — Progress Notes (Signed)
Cath lab; 103fr sheath pulled rfa and direct pressure maintained for 20 minutes resulting in complete hemostasis from arteriotomy.  Pt calm but easily agitated by sudden stimuli.  Sight was at level 0 with 2+distal pulses.  Attempted to educate pt but she has a altered affect and could not repeat instructions back to me.  Pt was also sedated in 4 point restraints.

## 2011-11-09 NOTE — CV Procedure (Signed)
  Cardiac Catheterization Procedure Note  Name: [name] MRN: [id number] DOB: 1930-03-12  Procedure: Left Heart Cath, Selective Coronary Angiography, LV angiography,  saphenous vein graft angiography, LIMA graft angiography, PTCA/Stent of the saphenous vein graft to the diagonal.  Indication: 78 year old white female with history of coronary disease status post CABG. She is status post stenting of the native left circumflex in 2010. She has a history of chronic atrial fibrillation and is on Coumadin. She presents now with a non-ST elevation myocardial infarction.   Diagnostic Procedure Details: The right groin was prepped, draped, and anesthetized with 1% lidocaine. Using the modified Seldinger technique, a 5 French sheath was introduced into the right femoral artery. Standard Judkins catheters were used for selective coronary angiography and left ventriculography. Catheter exchanges were performed over a wire.  The diagnostic procedure was well-tolerated without immediate complications.  PROCEDURAL FINDINGS Hemodynamics: AO 148/66 with a mean of 101 mmHg LV 147/10 mmHg  Coronary angiography: Coronary dominance: right  Left mainstem: The left main has 20-30% narrowing at the ostium.  Left anterior descending (LAD): The LAD is occluded at its ostium. It is supplied by the IMA graft.  Left circumflex (LCx): The left circumflex coronary has scattered nonobstructive disease to 20-30%. The stent is widely patent with 20% narrowing.  Right coronary artery (RCA): The right coronary is a large dominant vessel. It has diffuse irregularities throughout up to 20-30%.  The saphenous vein graft to the diagonal is patent but there is a segmental 90% stenosis in the proximal vein graft.  The LIMA graft to the LAD is widely patent with good runoff.  Left ventriculography: Left ventricular systolic function is normal, LVEF is estimated at 55-65%, there is moderate  mitral regurgitation   PCI  Procedure Note:  Following the diagnostic procedure, the decision was made to proceed with PCI. The sheath was upsized to a 6 [place]. Weight-based bivalirudin was given for anticoagulation. Plavix 600 mg was given orally. Once a therapeutic ACT was achieved, a 6 French left Amplatz 1 guide catheter was inserted.  A filter coronary guidewire was used to cross the lesion.  The filter was deployed in the distal vein graft. The lesion was predilated with a 2.5 mm compliant balloon.  The lesion was then stented with a 3.5 x 28 mm Veriflex stent to 10 atmospheres.    Following PCI, there was 0% residual stenosis and TIMI-3 flow. Final angiography confirmed an excellent result. The filter wire was retrieved without difficulty. Femoral hemostasis was achieved with manual compression.  The patient tolerated the PCI procedure well. There were no immediate procedural complications.  The patient was transferred to the post catheterization recovery area for further monitoring.  PCI Data: Vessel - saphenous vein graft to the diagonal/Segment - proximal Percent Stenosis (pre)  90% TIMI-flow 3 Stent 3.5 x 28 mm Veriflex stent Percent Stenosis (post) 0% TIMI-flow (post) 3  Final Conclusions:   1. Single vessel occlusive coronary disease. Continued patency of the stent in the mid left circumflex. 2. Severe stenosis in the saphenous vein graft to the diagonal. 3. Patent LIMA graft to the LAD. 4. Good left ventricular function with moderate mitral insufficiency. 5. Successful intracoronary stenting of the saphenous vein graft to the diagonal with a bare-metal stent.  Recommendations: Recommend continue dual antiplatelet therapy for at least one month. Coumadin can be resumed tomorrow. I would not bridge with heparin.  Peter [place] MD, [facility] 11/09/2011 10:23 AM

## 2011-11-09 NOTE — Progress Notes (Signed)
For cath today.  Plan stat BMET.

## 2011-11-09 NOTE — Progress Notes (Signed)
ANTICOAGULATION CONSULT NOTE - Follow Up Consult  Pharmacy Consult for heparin (INR < 2) Indication: chest pain/ACS  Allergies  Allergen Reactions  . Ivp Dye (Iodinated Diagnostic Agents)   . Codeine   . Morphine   . Statins     Patient Measurements: Height: 5\' 4"  (162.6 cm) Weight: 163 lb 5.8 oz (74.1 kg) IBW/kg (Calculated) : 54.7  Heparin Dosing Weight: 70.3  Vital Signs: Temp: 97.5 F (36.4 C) (07/29 0724) Temp src: Oral (07/29 0724) BP: 128/66 mmHg (07/29 0735)  Labs:  Basename 11/09/11 0545 11/08/11 0555 11/07/11 0519  HGB 14.7 14.4 --  HCT 43.8 42.9 42.3  PLT 276 305 286  APTT -- -- --  LABPROT 14.1 16.2* 18.4*  INR 1.07 1.27 1.50*  HEPARINUNFRC 0.73* 0.41 0.39  CREATININE -- -- --  CKTOTAL -- -- --  CKMB -- -- --  TROPONINI -- -- --    Estimated Creatinine Clearance: 55.3 ml/min (by C-G formula based on Cr of 0.6).   Medications:     . sodium chloride 1 mL/kg/hr (11/09/11 0400)  . heparin Stopped (11/09/11 0900)    Assessment: [name] is a 78 yo F admitted with ACS on IV heparin per pharmacy. She is on chronic Coumadin, which was held for  Cath today. Heparin level (0.73) is above goal on 850 units/hr.  CBC stable today.  Goal of Therapy:  Heparin level 0.3-0.7 units/ml Monitor platelets by anticoagulation protocol: Yes   Plan:  -No heparin changes as cath for today and only slightly above goal -Will follow plans post cath  Harland German, Pharm D 11/09/2011 9:15 AM

## 2011-11-09 NOTE — Progress Notes (Signed)
ANTICOAGULATION CONSULT NOTE - Follow Up Consult  Pharmacy Consult for coumadin Indication: chest pain/ACS  Allergies  Allergen Reactions  . Ivp Dye (Iodinated Diagnostic Agents)   . Codeine   . Morphine   . Statins     Patient Measurements: Height: 5\' 4"  (162.6 cm) Weight: 163 lb 5.8 oz (74.1 kg) IBW/kg (Calculated) : 54.7  Heparin Dosing Weight: 70.3  Vital Signs: Temp: 97.5 F (36.4 C) (07/29 0724) Temp src: Oral (07/29 0724) BP: 128/66 mmHg (07/29 0735) Pulse Rate: 86  (07/29 0907)  Labs:  Basename 11/09/11 0910 11/09/11 0545 11/08/11 0555 11/07/11 0519  HGB -- 14.7 14.4 --  HCT -- 43.8 42.9 42.3  PLT -- 276 305 286  APTT -- -- -- --  LABPROT -- 14.1 16.2* 18.4*  INR -- 1.07 1.27 1.50*  HEPARINUNFRC -- 0.73* 0.41 0.39  CREATININE 0.67 -- -- --  CKTOTAL -- -- -- --  CKMB -- -- -- --  TROPONINI -- -- -- --    Estimated Creatinine Clearance: 55.3 ml/min (by C-G formula based on Cr of 0.67).   Medications:     . sodium chloride    . DISCONTD: sodium chloride 1 mL/kg/hr (11/09/11 0400)  . DISCONTD: heparin Stopped (11/09/11 0900)    Assessment: [name] is a 78 yo F who is s/p cath with PCI. She has been on coumadin for her afib. Per Dr. Elvis Coil note, ok to resume coumadin tomorrow.   Goal of Therapy:  INR = 2-3 Monitor platelets by anticoagulation protocol: Yes   Plan:  Dc heparin Resume coumadin 7/30

## 2011-11-09 NOTE — Progress Notes (Signed)
Md notified for medication clarification.  Pt HR in the 50's.  Will hold bp medication pm dose per MD.  Will continue to monitor. Emilie [place]

## 2011-11-09 NOTE — Progress Notes (Addendum)
I was called to see patient for acute confusion. Patient became confused on transport to unit. She is disoriented and combative- hitting out at caregivers and attempting to bite. Now in 4 point restraint for safety. Femoral sheath still in place. Patient not cooperative enough to remove. She is oriented to self but not to situation or location. She moves all extremities well and speech is normal. Cranial nerves are all intact.   I feel she has an acute delirium related to sedation in the cath lab or potentially prednisone that she received pre procedure. We will give IV haldol for sedation. When patient can cooperate will attempt to remove femoral sheath. Discussed with family.  Fredie Majano [place] MD, [facility]  11/09/2011 1:19 PM Patient did not respond to haldol. Still confused and combative. Will transfer to [facility] for more intensive monitoring and IV Precedex for sedation so we can remove sheath. If delirium does not improve may need cranial CT or MRI but currently unable to lie still for study.  Philbert Ocallaghan [place] MD, [facility]  11/09/2011 .3:19 PM

## 2011-11-09 NOTE — Interval H&P Note (Signed)
History and Physical Interval Note:  11/09/2011 9:09 AM  [name]  has presented today for surgery, with the diagnosis of chest pain  The various methods of treatment have been discussed with the patient and family. After consideration of risks, benefits and other options for treatment, the patient has consented to  Procedure(s) (LRB): LEFT HEART CATHETERIZATION WITH CORONARY/GRAFT ANGIOGRAM (N/A) as a surgical intervention .  The patient's history has been reviewed, patient examined, no change in status, stable for surgery.  I have reviewed the patient's chart and labs.  Questions were answered to the patient's satisfaction.     Theron Arista [facility] 11/09/2011 9:09 AM

## 2011-11-10 LAB — BASIC METABOLIC PANEL
CO2: 25 mEq/L (ref 19–32)
Calcium: 10 mg/dL (ref 8.4–10.5)
GFR calc non Af Amer: 83 mL/min — ABNORMAL LOW (ref >90)
Potassium: 3.5 mEq/L (ref 3.5–5.1)
Sodium: 137 mEq/L (ref 135–145)

## 2011-11-10 LAB — CBC
HCT: 39.7 % (ref 36.0–46.0)
MCV: 93.4 fL (ref 78.0–100.0)
RBC: 4.25 MIL/uL (ref 3.87–5.11)
RDW: 15.1 % (ref 11.5–15.5)
WBC: 20.4 10*3/uL — ABNORMAL HIGH (ref 4.0–10.5)

## 2011-11-10 LAB — PROTIME-INR: INR: 1.15 (ref 0.00–1.49)

## 2011-11-10 LAB — GLUCOSE, CAPILLARY: Glucose-Capillary: 88 mg/dL (ref 70–99)

## 2011-11-10 MED ORDER — ENSURE COMPLETE PO LIQD
237.0000 mL | Freq: Two times a day (BID) | ORAL | Status: DC
  Administered 2011-11-13: 237 mL via ORAL

## 2011-11-10 MED ORDER — INSULIN ASPART 100 UNIT/ML ~~LOC~~ SOLN
4.0000 [IU] | Freq: Three times a day (TID) | SUBCUTANEOUS | Status: DC
  Administered 2011-11-10 – 2011-11-12 (×2): 4 [IU] via SUBCUTANEOUS
  Administered 2011-11-13: 7 [IU] via SUBCUTANEOUS
  Administered 2011-11-13: 10 [IU] via SUBCUTANEOUS
  Administered 2011-11-14: 4 [IU] via SUBCUTANEOUS

## 2011-11-10 MED ORDER — INSULIN GLARGINE 100 UNIT/ML ~~LOC~~ SOLN
55.0000 [IU] | Freq: Every day | SUBCUTANEOUS | Status: DC
  Administered 2011-11-10: 55 [IU] via SUBCUTANEOUS

## 2011-11-10 MED ORDER — WARFARIN SODIUM 4 MG PO TABS
4.0000 mg | ORAL_TABLET | Freq: Once | ORAL | Status: AC
  Administered 2011-11-10: 4 mg via ORAL
  Filled 2011-11-10: qty 1

## 2011-11-10 MED FILL — Dextrose Inj 5%: INTRAVENOUS | Qty: 50 | Status: AC

## 2011-11-10 NOTE — Progress Notes (Signed)
Subjective:  Diabetes management: Blood sugars are running still somewhat over 200 despite her not eating as much. However did not get her full dose of Lantus the night before last and will need to go up on her basal insulin. Eating about 50% of her breakfast this morning. She is a little more alert and cooperative  CBG (last 3)   Basename 11/09/11 2140 11/09/11 1730 11/09/11 1303  GLUCAP 224* 198* 249*     Objective: Vital signs in last 24 hours: Temp:  [97.5 F (36.4 C)-97.9 F (36.6 C)] 97.9 F (36.6 C) (07/30 0400) Pulse Rate:  [41-90] 90  (07/30 0700) Resp:  [16-23] 20  (07/30 0700) BP: (100-178)/(51-121) 143/63 mmHg (07/30 0700) SpO2:  [94 %-98 %] 97 % (07/30 0700) Arterial Line BP: (163-176)/(77-82) 163/77 mmHg (07/29 1515) Weight change:  Last BM Date: 11/08/11  Intake/Output from previous day: 07/29 0701 - 07/30 0700 In: 1197.2 [P.O.:180; I.V.:1017.2] Out: 575 [Urine:575] Intake/Output this shift:    General appearance: alert, cooperative, fatigued and mild distress  Lab Results:  Basename 11/10/11 0600 11/09/11 0545  WBC 20.4* 11.0*  HGB 13.3 14.7  HCT 39.7 43.8  PLT 273 276   BMET  Basename 11/10/11 0600 11/09/11 0910  NA 137 138  K 3.5 4.6  CL 101 100  CO2 25 26  GLUCOSE 231* 351*  BUN 18 19  CREATININE 0.62 0.67  CALCIUM 10.0 10.0    Studies/Results: No results found.  Medications:  Scheduled:   . amLODipine  5 mg Oral Daily  . aspirin  81 mg Oral Daily  . bivalirudin      . clopidogrel      . clopidogrel  75 mg Oral Q breakfast  . colchicine  0.6 mg Oral Daily  . colesevelam  625 mg Oral TID  . diazepam  5 mg Oral On Call  . diphenhydrAMINE  25 mg Intravenous On Call  . ezetimibe  10 mg Oral Daily  . famotidine      . febuxostat  40 mg Oral Daily  . fentaNYL      . gabapentin  300 mg Oral TID  . heparin      . insulin aspart  12 Units Subcutaneous Q supper  . insulin aspart  14 Units Subcutaneous BID WC  . insulin aspart   4-14 Units Subcutaneous TID AC  . insulin glargine  20 Units Subcutaneous NOW  . insulin glargine  35 Units Subcutaneous QHS  . insulin glargine  45 Units Subcutaneous Daily  . irbesartan  150 mg Oral Daily  . lidocaine      . LORazepam  0.5 mg Intravenous Once  . magnesium oxide  400 mg Oral Daily  . metoprolol tartrate  25 mg Oral TID  . midazolam      . nitroGLYCERIN      . omega-3 acid ethyl esters  1 g Oral Daily  . prenatal multivitamin  1 tablet Oral Daily  . sodium chloride  3 mL Intravenous Q12H  . Vitamin D (Ergocalciferol)  50,000 Units Oral Q7 days  . Warfarin - Pharmacist Dosing Inpatient   Does not apply q1800  . DISCONTD: insulin aspart  14 Units Subcutaneous Q supper  . DISCONTD: insulin glargine  40 Units Subcutaneous QHS  . DISCONTD: sodium chloride  3 mL Intravenous Q12H     Assessment/Plan:  today we will will increase her Lantus insulin and her correction dose today blood sugar back at least under 180 . We'll continue to  follow her Also has mild hypokalemia   LOS: 6 days   [name] 11/10/2011, 8:10 AM

## 2011-11-10 NOTE — Progress Notes (Signed)
Subjective:  Events of yesterday note.  Spoke with team yesterday.  She is starting to wake up and may be better.  No obvious deficits.    Objective:  Vital Signs in the last 24 hours: Temp:  [97.5 F (36.4 C)-97.9 F (36.6 C)] 97.9 F (36.6 C) (07/30 0400) Pulse Rate:  [27-90] 27  (07/30 0800) Resp:  [16-23] 20  (07/30 0700) BP: (100-178)/(51-121) 143/63 mmHg (07/30 0700) SpO2:  [94 %-98 %] 97 % (07/30 0700) Arterial Line BP: (163-176)/(77-82) 163/77 mmHg (07/29 1515)  Intake/Output from previous day: 07/29 0701 - 07/30 0700 In: 1197.2 [P.O.:180; I.V.:1017.2] Out: 575 [Urine:575]   Physical Exam: General: Well developed, well nourished, in no acute distress.   Somewhat pale Rate varies from 30s to 60s with somewhat slow response.   Head:  Normocephalic and atraumatic. Lungs: Clear to auscultation and percussion. Heart: irregularly irregular rhythm.  No murmur Pulses: Pulses normal in all 4 extremities. Extremities: No clubbing or cyanosis. No edema. Neurologic: Alert and oriented x 3. She knows me and where she is.  No longer fighting.  Otherwise stable.      Lab Results:  Basename 11/10/11 0600 11/09/11 0545  WBC 20.4* 11.0*  HGB 13.3 14.7  PLT 273 276    Basename 11/10/11 0600 11/09/11 0910  NA 137 138  K 3.5 4.6  CL 101 100  CO2 25 26  GLUCOSE 231* 351*  BUN 18 19  CREATININE 0.62 0.67   No results found for this basename: TROPONINI:2,CK,MB:2 in the last 72 hours Hepatic Function Panel No results found for this basename: PROT,ALBUMIN,AST,ALT,ALKPHOS,BILITOT,BILIDIR,IBILI in the last 72 hours No results found for this basename: CHOL in the last 72 hours No results found for this basename: PROTIME in the last 72 hours  Imaging: No results found.  EKG:  afib with very slow response.     Assessment/Plan:  Patient Active Hospital Problem List: Non-ST elevation myocardial infarction (NSTEMI), initial episode of care (11/04/2011)   Assessment: status post  PCI   Plan: stable following intervention.   IDDM (07/09/2008)   Assessment: appreciate help of Dr. Lucianne Muss   Plan: continue to follow.  Leukocytosis (03/16/2011)   Assessment: likely due to steroids   Plan: follow Atrial fibrillation (10/18/2011)   Assessment: very slow response.    Plan: ? Secondary to meds.  Currently on hold.  Monitor.  Chronic anticoagulation (11/04/2011)   Assessment: recently started on warfarin, but will need to hold in short term.  Consider restarting in two weeks.     Plan: see above. Delirium   Etiology unclear, and now improved.  Continue current management.  Neuro consult and MRI if not improved.        Shawnie Pons, MD, [facility], FSCAI 11/10/2011, 9:28 AM

## 2011-11-10 NOTE — Progress Notes (Signed)
INITIAL ADULT NUTRITION ASSESSMENT Date: 11/10/2011   Time: 10:01 AM Reason for Assessment: Low Braden Score  INTERVENTION: 1. Ensure Complete po BID, each supplement provides 350 kcal and 13 grams of protein. 2. RD will continue to follow    ASSESSMENT: Female 78 y.o.  Dx: Non-ST elevation myocardial infarction (NSTEMI), initial episode of care  Hx:  Past Medical History  Diagnosis Date  . Other chronic pulmonary heart diseases   . HTN (hypertension)   . Coronary atherosclerosis of unspecified type of vessel, native or graft     remote CABG in 1999 with Dr. Laneta Simmers, S/P PCI with stent to LCX in 2009  . Personal history of other diseases of digestive system   . Gout, unspecified   . Unspecified hereditary and idiopathic peripheral neuropathy   . Esophageal reflux   . S/P coronary artery stent placement 06/21/07    Platinum protocol study. Patent at follow up  . Anemia   . GI bleed   . Primary pancreatic carcinoid tumor   . 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 Leukocytosis 03/16/2011  . Hyperlipidemia   . CLL (chronic lymphocytic leukemia)     seeing Dr. [name]  . Pancreas disorder     prior pancreatectomy in 2011 complicated by leak requiring prolong and repeated drainage and subsequent pancreatic stent  . Dysrhythmia 11/04/2011    atrial fibrilation  . Shortness of breath   . Type I (juvenile type) diabetes mellitus without mention of complication, not stated as uncontrolled     has insulin pump    Related Meds:     . amLODipine  5 mg Oral Daily  . aspirin  81 mg Oral Daily  . clopidogrel  75 mg Oral Q breakfast  . colchicine  0.6 mg Oral Daily  . colesevelam  625 mg Oral TID  . ezetimibe  10 mg Oral Daily  . febuxostat  40 mg Oral Daily  . gabapentin  300 mg Oral TID  . insulin aspart  12 Units Subcutaneous Q supper  . insulin aspart  14 Units Subcutaneous BID WC  . insulin aspart  4-12 Units Subcutaneous TID AC  . insulin glargine   20 Units Subcutaneous NOW  . insulin glargine  35 Units Subcutaneous QHS  . insulin glargine  55 Units Subcutaneous Daily  . irbesartan  150 mg Oral Daily  . LORazepam  0.5 mg Intravenous Once  . magnesium oxide  400 mg Oral Daily  . metoprolol tartrate  25 mg Oral TID  . omega-3 acid ethyl esters  1 g Oral Daily  . prenatal multivitamin  1 tablet Oral Daily  . sodium chloride  3 mL Intravenous Q12H  . Vitamin D (Ergocalciferol)  50,000 Units Oral Q7 days  . warfarin  4 mg Oral ONCE-1800  . Warfarin - Pharmacist Dosing Inpatient   Does not apply q1800  . DISCONTD: insulin aspart  14 Units Subcutaneous Q supper  . DISCONTD: insulin aspart  4-14 Units Subcutaneous TID AC  . DISCONTD: insulin glargine  40 Units Subcutaneous QHS  . DISCONTD: insulin glargine  45 Units Subcutaneous Daily  . DISCONTD: sodium chloride  3 mL Intravenous Q12H     Ht: 5\' 4"  (162.6 cm)  Wt: 163 lb 5.8 oz (74.1 kg)  Ideal Wt: 54.5 kg  % Ideal Wt: 136%  Usual Wt:  Wt Readings from Last 10 Encounters:  11/09/11 163 lb 5.8 oz (74.1 kg)  11/09/11 163 lb 5.8 oz (74.1 kg)  11/09/11  163 lb 5.8 oz (74.1 kg)  11/02/11 169 lb (76.658 kg)  10/20/11 165 lb (74.844 kg)  10/13/11 166 lb (75.297 kg)  10/07/11 164 lb (74.39 kg)  09/14/11 166 lb 11.2 oz (75.615 kg)  06/18/11 167 lb (75.751 kg)  06/01/11 165 lb (74.844 kg)    % Usual Wt: ~100%  Body mass index is 28.04 kg/(m^2).  Pt BMI meets criteria for overweight   Food/Nutrition Related Hx: no problems PTA per admission nutrition screen   Labs:  CMP     Component Value Date/Time   NA 137 11/10/2011 0600   K 3.5 11/10/2011 0600   CL 101 11/10/2011 0600   CO2 25 11/10/2011 0600   GLUCOSE 231* 11/10/2011 0600   BUN 18 11/10/2011 0600   CREATININE 0.62 11/10/2011 0600   CREATININE 0.96 09/14/2011 1232   CALCIUM 10.0 11/10/2011 0600   PROT 6.7 11/05/2011 0307   ALBUMIN 3.3* 11/05/2011 0307   AST 19 11/05/2011 0307   ALT 18 11/05/2011 0307   ALKPHOS 84 11/05/2011  0307   BILITOT 0.3 11/05/2011 0307   GFRNONAA 83* 11/10/2011 0600   GFRAA >90 11/10/2011 0600      Intake/Output Summary (Last 24 hours) at 11/10/11 1011 Last data filed at 11/10/11 0900  Gross per 24 hour  Intake 1098.8 ml  Output    500 ml  Net  598.8 ml      Diet Order: Carb Control  Supplements/Tube Feeding: none  IVF:    sodium chloride Last Rate: 1 mL/kg/hr (11/09/11 1235)  dexmedetomidine Last Rate: Stopped (11/10/11 0945)  DISCONTD: sodium chloride Last Rate: 1 mL/kg/hr (11/09/11 0400)  DISCONTD: heparin Last Rate: Stopped (11/09/11 0900)    Estimated Nutritional Needs:   Kcal: 1500-1700 Protein: 75-85 gm  Fluid:  1.5-1.7 L   RD pulled to chart for low Braden score. Indicates possibly inadequate oral intake, pt is at increased risk for skin breakdown. No wounds noted at this time. Pt with increased agitation yesterday requiring restraints. Pt with limited mobility also increases risk for skin breakdown. PO intake was ~20% this morning per Tech who is sitting with pt.   NUTRITION DIAGNOSIS: -Inadequate oral intake (NI-2.1).  Status: Ongoing  RELATED TO: recent delirium   AS EVIDENCE BY: 20% intake reported by tech  MONITORING/EVALUATION(Goals): Goal: PO intake of meals and supplements to meet >90% of needs to maintain skin integrity.  Monitor: PO intake, weight, skin integrity   EDUCATION NEEDS: -No education needs identified at this time   DOCUMENTATION CODES Per approved criteria  -Not Applicable    Clarene Duke RD, LDN Pager [phone] After Hours pager [phone]

## 2011-11-10 NOTE — Progress Notes (Signed)
HR up and mentation much improved.  She seems to me to be back to near normal.   Will plan to dc patient likely without warfarin for a couple of weeks.

## 2011-11-10 NOTE — Progress Notes (Signed)
ANTICOAGULATION CONSULT NOTE - Follow Up Consult  Pharmacy Consult for coumadin Indication: afib  Allergies  Allergen Reactions  . Ivp Dye (Iodinated Diagnostic Agents)   . Codeine   . Morphine   . Statins     Patient Measurements: Height: 5\' 4"  (162.6 cm) Weight: 163 lb 5.8 oz (74.1 kg) IBW/kg (Calculated) : 54.7  Heparin Dosing Weight: 70.3  Vital Signs: Temp: 97.9 F (36.6 C) (07/30 0400) Temp src: Oral (07/30 0400) BP: 143/63 mmHg (07/30 0700) Pulse Rate: 90  (07/30 0700)  Labs:  Basename 11/10/11 0600 11/09/11 0910 11/09/11 0545 11/08/11 0555  HGB 13.3 -- 14.7 --  HCT 39.7 -- 43.8 42.9  PLT 273 -- 276 305  APTT -- -- -- --  LABPROT 14.9 -- 14.1 16.2*  INR 1.15 -- 1.07 1.27  HEPARINUNFRC -- -- 0.73* 0.41  CREATININE 0.62 0.67 -- --  CKTOTAL -- -- -- --  CKMB -- -- -- --  TROPONINI -- -- -- --    Estimated Creatinine Clearance: 55.3 ml/min (by C-G formula based on Cr of 0.62).   Medications:     . sodium chloride 1 mL/kg/hr (11/09/11 1235)  . dexmedetomidine 0.5 mcg/kg/hr (11/10/11 0200)  . DISCONTD: sodium chloride 1 mL/kg/hr (11/09/11 0400)  . DISCONTD: heparin Stopped (11/09/11 0900)    Assessment: [name] is a 78 yo F who is s/p cath with PCI. She has been on coumadin for her afib. Home coumadin dose 2.5mg  STTS and 4mg  MWF (INR=2.56 at admit). INR=1.15 today and patient to resume coumadin today. Patient noted on ASA and plavix for at least one month.   Goal of Therapy:  INR = 2-3 Monitor platelets by anticoagulation protocol: Yes   Plan:  -Coumadin 4mg  po today -Daily PT/INR  Harland German, Pharm D 11/10/2011 8:33 AM

## 2011-11-11 LAB — CBC
HCT: 41.1 % (ref 36.0–46.0)
MCH: 32 pg (ref 26.0–34.0)
MCV: 93.8 fL (ref 78.0–100.0)
RBC: 4.38 MIL/uL (ref 3.87–5.11)
WBC: 20.3 10*3/uL — ABNORMAL HIGH (ref 4.0–10.5)

## 2011-11-11 LAB — GLUCOSE, CAPILLARY
Glucose-Capillary: 103 mg/dL — ABNORMAL HIGH (ref 70–99)
Glucose-Capillary: 112 mg/dL — ABNORMAL HIGH (ref 70–99)

## 2011-11-11 MED ORDER — INSULIN ASPART 100 UNIT/ML ~~LOC~~ SOLN
10.0000 [IU] | Freq: Every day | SUBCUTANEOUS | Status: DC
  Administered 2011-11-11: 12 [IU] via SUBCUTANEOUS
  Administered 2011-11-13: 10 [IU] via SUBCUTANEOUS

## 2011-11-11 MED ORDER — WARFARIN SODIUM 4 MG PO TABS
4.0000 mg | ORAL_TABLET | Freq: Once | ORAL | Status: AC
  Administered 2011-11-11: 4 mg via ORAL
  Filled 2011-11-11: qty 1

## 2011-11-11 MED ORDER — INSULIN GLARGINE 100 UNIT/ML ~~LOC~~ SOLN
30.0000 [IU] | Freq: Every day | SUBCUTANEOUS | Status: DC
  Administered 2011-11-11 – 2011-11-12 (×2): 30 [IU] via SUBCUTANEOUS

## 2011-11-11 MED ORDER — INSULIN GLARGINE 100 UNIT/ML ~~LOC~~ SOLN
48.0000 [IU] | Freq: Every day | SUBCUTANEOUS | Status: DC
  Administered 2011-11-11 – 2011-11-12 (×2): 48 [IU] via SUBCUTANEOUS

## 2011-11-11 MED ORDER — INSULIN ASPART 100 UNIT/ML ~~LOC~~ SOLN
12.0000 [IU] | Freq: Two times a day (BID) | SUBCUTANEOUS | Status: DC
  Administered 2011-11-11: 10 [IU] via SUBCUTANEOUS
  Administered 2011-11-12 – 2011-11-13 (×3): 12 [IU] via SUBCUTANEOUS

## 2011-11-11 NOTE — Progress Notes (Signed)
ANTICOAGULATION CONSULT NOTE - Follow Up Consult  Pharmacy Consult for coumadin Indication: afib  Allergies  Allergen Reactions  . Ivp Dye (Iodinated Diagnostic Agents)   . Codeine   . Morphine   . Statins     Patient Measurements: Height: 5\' 4"  (162.6 cm) Weight: 165 lb 5.5 oz (75 kg) IBW/kg (Calculated) : 54.7  Heparin Dosing Weight: 70.3  Vital Signs: Temp: 97.7 F (36.5 C) (07/31 0700) Temp src: Oral (07/31 0700) BP: 142/67 mmHg (07/31 0700) Pulse Rate: 77  (07/31 0700)  Labs:  Basename 11/11/11 0552 11/10/11 0600 11/09/11 0910 11/09/11 0545  HGB 14.0 13.3 -- --  HCT 41.1 39.7 -- 43.8  PLT 246 273 -- 276  APTT -- -- -- --  LABPROT 14.9 14.9 -- 14.1  INR 1.15 1.15 -- 1.07  HEPARINUNFRC -- -- -- 0.73*  CREATININE -- 0.62 0.67 --  CKTOTAL -- -- -- --  CKMB -- -- -- --  TROPONINI -- -- -- --    Estimated Creatinine Clearance: 55.6 ml/min (by C-G formula based on Cr of 0.62).   Medications:     . dexmedetomidine Stopped (11/10/11 0945)    Assessment: Ms. Canaday is a 78 yo F who is s/p cath with PCI. She has been on coumadin for her afib. Home coumadin dose 2.5mg  STTS and 4mg  MWF (INR=2.56 at admit). INR=1.15 today and noted plans for likely d/c without coumadin.  Goal of Therapy:  INR = 2-3 Monitor platelets by anticoagulation protocol: Yes   Plan:  -Coumadin 4mg  po today -Daily PT/INR -Will follow plans for coumadin  Harland German, Pharm D 11/11/2011 8:39 AM

## 2011-11-11 NOTE — Progress Notes (Signed)
CARDIAC REHAB PHASE I   PRE:  Rate/Rhythm: 90 afib    BP: sitting 124/62    SaO2:   MODE:  Ambulation: 140 ft   POST:  Rate/Rhythm: 102 afib    BP: sitting 111/66     SaO2:   Pt weak and somewhat SOB, which she sts is normal for her "I'm always SOB". Pt determined to walk a "good" distance. Walks short distances with rollator at home. Caregiver helps as needed. Walks pushing buggy in grocery store but gets tired. Sts her caregiver can take care of her during the day and she can call him if she needs him at night. Afib controlled walking. Gave stent card and MI book. [phone]   Elissa Lovett [place] CES, ACSM

## 2011-11-11 NOTE — Progress Notes (Signed)
Subjective:  Much more alert and doing well.  No chest pain.  Feels good.    Objective:  Vital Signs in the last 24 hours: Temp:  [97.7 F (36.5 C)-98.9 F (37.2 C)] 98 F (36.7 C) (07/31 1200) Pulse Rate:  [59-99] 92  (07/31 1200) Resp:  [16-26] 20  (07/31 1200) BP: (109-173)/(39-97) 144/73 mmHg (07/31 1200) SpO2:  [91 %-97 %] 95 % (07/31 1200) Weight:  [165 lb 5.5 oz (75 kg)] 165 lb 5.5 oz (75 kg) (07/31 0500)  Intake/Output from previous day: 07/30 0701 - 07/31 0700 In: 760 [P.O.:530; I.V.:230] Out: 903 [Urine:901; Stool:2]   Physical Exam: General: Well developed, well nourished, in no acute distress. Head:  Normocephalic and atraumatic. Lungs: Clear to auscultation and percussion. Heart: irregularly irregular.  Pulses: Pulses normal in all 4 extremities. Extremities: No clubbing or cyanosis. No edema. Neurologic: Alert and oriented x 3.    Lab Results:  Basename 11/11/11 0552 11/10/11 0600  WBC 20.3* 20.4*  HGB 14.0 13.3  PLT 246 273    Basename 11/10/11 0600 11/09/11 0910  NA 137 138  K 3.5 4.6  CL 101 100  CO2 25 26  GLUCOSE 231* 351*  BUN 18 19  CREATININE 0.62 0.67   No results found for this basename: TROPONINI:2,CK,MB:2 in the last 72 hours Hepatic Function Panel No results found for this basename: PROT,ALBUMIN,AST,ALT,ALKPHOS,BILITOT,BILIDIR,IBILI in the last 72 hours No results found for this basename: CHOL in the last 72 hours No results found for this basename: PROTIME in the last 72 hours  Imaging: No results found.    Assessment/Plan:  Patient Active Hospital Problem List: Non-ST elevation myocardial infarction (NSTEMI), initial episode of care (11/04/2011)   Assessment: status post PCI of SVG, stable   Plan: continue DAPT for now.   IDDM (07/09/2008)   Assessment: glucoses high but better today. See note of Dr. Lucianne Muss   Plan: appreciate endo help  HYPERTENSION (07/09/2008)   Assessment: stable   Plan: see values Leukocytosis  (03/16/2011)   Assessment: secondary to steroids   Plan: monitor Atrial fibrillation (10/18/2011)   Assessment: rate controlled   Continue medical therapy.  Resume warfarin in 2 weeks.  Stop Plavix at that time        Shawnie Pons, MD, [facility], [facility] 11/11/2011, 2:08 PM

## 2011-11-11 NOTE — Progress Notes (Signed)
Subjective:  CBG (last 3)   Basename 11/11/11 0751 11/10/11 2122 11/10/11 1608  GLUCAP 103* 88 89    Her blood sugars have improved significantly, probably were high from stress of surgery and her agitation. Also Lantus was increased yesterday. No hypoglycemic symptoms. She is eating a little better at breakfast today. Has not been ambulating as yet.  Objective: Vital signs in last 24 hours: Temp:  [97.3 F (36.3 C)-98.9 F (37.2 C)] 97.7 F (36.5 C) (07/31 0700) Pulse Rate:  [39-95] 77  (07/31 0700) Resp:  [14-26] 21  (07/31 0700) BP: (109-156)/(39-97) 142/67 mmHg (07/31 0700) SpO2:  [91 %-98 %] 92 % (07/31 0700) Weight:  [75 kg (165 lb 5.5 oz)] 75 kg (165 lb 5.5 oz) (07/31 0500) Weight change:  Last BM Date: 11/10/11  Intake/Output from previous day: 07/30 0701 - 07/31 0700 In: 760 [P.O.:530; I.V.:230] Out: 903 [Urine:901; Stool:2] Intake/Output this shift:    General appearance: alert and no distress  Lab Results:  Basename 11/11/11 0552 11/10/11 0600  WBC 20.3* 20.4*  HGB 14.0 13.3  HCT 41.1 39.7  PLT 246 273   BMET  Basename 11/10/11 0600 11/09/11 0910  NA 137 138  K 3.5 4.6  CL 101 100  CO2 25 26  GLUCOSE 231* 351*  BUN 18 19  CREATININE 0.62 0.67  CALCIUM 10.0 10.0    Studies/Results: No results found.  Medications:  Scheduled:   . amLODipine  5 mg Oral Daily  . aspirin  81 mg Oral Daily  . clopidogrel  75 mg Oral Q breakfast  . colchicine  0.6 mg Oral Daily  . colesevelam  625 mg Oral TID  . ezetimibe  10 mg Oral Daily  . febuxostat  40 mg Oral Daily  . feeding supplement  237 mL Oral BID BM  . gabapentin  300 mg Oral TID  . insulin aspart  12 Units Subcutaneous Q supper  . insulin aspart  14 Units Subcutaneous BID WC  . insulin aspart  4-12 Units Subcutaneous TID AC  . insulin glargine  35 Units Subcutaneous QHS  . insulin glargine  55 Units Subcutaneous Daily  . irbesartan  150 mg Oral Daily  . magnesium oxide  400 mg Oral Daily  .  metoprolol tartrate  25 mg Oral TID  . omega-3 acid ethyl esters  1 g Oral Daily  . prenatal multivitamin  1 tablet Oral Daily  . sodium chloride  3 mL Intravenous Q12H  . Vitamin D (Ergocalciferol)  50,000 Units Oral Q7 days  . warfarin  4 mg Oral ONCE-1800  . Warfarin - Pharmacist Dosing Inpatient   Does not apply q1800  . DISCONTD: insulin aspart  4-14 Units Subcutaneous TID AC  . DISCONTD: insulin glargine  45 Units Subcutaneous Daily    Assessment/Plan: Glucose is practically normal since the last afternoon. Will reduce her Lantus by 15 units overall and NovoLog by 2 units at each meal. We'll continue to follow  LOS: 7 days   Muneer Leider 11/11/2011, 8:08 AM

## 2011-11-12 DIAGNOSIS — I4891 Unspecified atrial fibrillation: Secondary | ICD-10-CM

## 2011-11-12 DIAGNOSIS — I214 Non-ST elevation (NSTEMI) myocardial infarction: Secondary | ICD-10-CM

## 2011-11-12 LAB — CBC
Hemoglobin: 13.5 g/dL (ref 12.0–15.0)
MCH: 31.5 pg (ref 26.0–34.0)
MCV: 95.1 fL (ref 78.0–100.0)
Platelets: 249 10*3/uL (ref 150–400)
RBC: 4.29 MIL/uL (ref 3.87–5.11)
WBC: 17.2 10*3/uL — ABNORMAL HIGH (ref 4.0–10.5)

## 2011-11-12 LAB — GLUCOSE, CAPILLARY
Glucose-Capillary: 179 mg/dL — ABNORMAL HIGH (ref 70–99)
Glucose-Capillary: 129 mg/dL — ABNORMAL HIGH (ref 70–99)

## 2011-11-12 LAB — PROTIME-INR: Prothrombin Time: 16.8 seconds — ABNORMAL HIGH (ref 11.6–15.2)

## 2011-11-12 NOTE — Progress Notes (Signed)
Subjective:  Stable.  More alert.  Not very active though.    Objective:  Vital Signs in the last 24 hours: Temp:  [98 F (36.7 C)-98.8 F (37.1 C)] 98 F (36.7 C) (08/01 0700) Pulse Rate:  [68-95] 95  (08/01 0700) Resp:  [18-28] 22  (08/01 1000) BP: (100-132)/(48-89) 127/89 mmHg (08/01 1000) SpO2:  [89 %-98 %] 96 % (08/01 0900) Weight:  [163 lb 9.3 oz (74.2 kg)] 163 lb 9.3 oz (74.2 kg) (08/01 0500)  Intake/Output from previous day: 07/31 0701 - 08/01 0700 In: 1283 [P.O.:1280; I.V.:3] Out: 1775 [Urine:1775]   Physical Exam: General: Well developed, well nourished, in no acute distress.  Slightly pale Head:  Normocephalic and atraumatic. Lungs: Clear to auscultation and percussion. Heart: irregularly irregular  Pulses: Pulses normal in all 4 extremities. Extremities: No clubbing or cyanosis. No edema. Neurologic: Alert and oriented x 3.    Lab Results:  Basename 11/12/11 0513 11/11/11 0552  WBC 17.2* 20.3*  HGB 13.5 14.0  PLT 249 246    Basename 11/10/11 0600  NA 137  K 3.5  CL 101  CO2 25  GLUCOSE 231*  BUN 18  CREATININE 0.62   No results found for this basename: TROPONINI:2,CK,MB:2 in the last 72 hours Hepatic Function Panel No results found for this basename: PROT,ALBUMIN,AST,ALT,ALKPHOS,BILITOT,BILIDIR,IBILI in the last 72 hours No results found for this basename: CHOL in the last 72 hours No results found for this basename: PROTIME in the last 72 hours  Imaging: No results found.    Assessment/Plan:  Patient Active Hospital Problem List: Non-ST elevation myocardial infarction (NSTEMI), initial episode of care (11/04/2011)   Assessment: improved   Plan: transfer to floor, ambulate and plan to dc tomorrow.  I suggested short term SNF--she said no.  IDDM (07/09/2008)   Assessment: modestly elevated   Plan: appreciate Dr. Lucianne Muss help.   Leukocytosis (03/16/2011)   Assessment: secondary to steroids and stress coming down   Plan: observe Atrial  fibrillation (10/18/2011)   Assessment: rate controlled   Plan: hold on warfarin for now. Chronic anticoagulation (11/04/2011)   Assessment: resume warfarin in two weeks as OP.         Shawnie Pons, MD, [facility], FSCAI 11/12/2011, 12:24 PM

## 2011-11-12 NOTE — Clinical Social Work Psychosocial (Signed)
     Clinical Social Work Department BRIEF PSYCHOSOCIAL ASSESSMENT 11/12/2011  Patient:  [name], [name]     Account Number:  [id number]     Admit date:  11/04/2011  Clinical Social Worker:  Hulan Fray  Date/Time:  11/12/2011 10:05 AM  Referred by:  RN  Date Referred:  11/12/2011 Referred for  Other - See comment   Other Referral:   "Family wants patient to go to rehab after hospitalization. The patient does not know this information.  Family would like information on the process."   Interview type:  Patient Other interview type:   and unit RN    PSYCHOSOCIAL DATA Living Status:  ALONE Admitted from facility:   Level of care:   Primary support name:  Loraine Leriche Primary support relationship to patient:  CHILD, ADULT Degree of support available:   supportive    CURRENT CONCERNS Current Concerns  Other - See comment   Other Concerns:   Patient is interested in home health services    SOCIAL WORK ASSESSMENT / PLAN Clinical Social Worker received referral for family requesting short term SNF placement for patient. CSW reviewed chart and spoke to unit RN. CSW and CM spoke with patient regarding home health services and short term SNF placement. Patient reported that she lives alone, but has a caregiver that comes to the house from 8am-10pm. Patient reported that she has been to [facility] previously and was not happy with her stay there. Patient is refusing short term SNF placement and is agreeable to home health services at discharge. CSW will sign off, as social work intevention is no longer needed.   Assessment/plan status:  No Further Intervention Required Other assessment/ plan:   Information/referral to community resources:   CM provided patient with list of home health services in [place].    PATIENTS/FAMILYS RESPONSE TO PLAN OF CARE: Patient refused short term SNF placement, but is agreeable to home health services at discharge.

## 2011-11-12 NOTE — Care Management Note (Signed)
    Page 1 of 2   11/12/2011     10:11:32 AM   CARE MANAGEMENT NOTE 11/12/2011  Patient:  [name], [name]   Account Number:  [id number]  Date Initiated:  11/06/2011  Documentation initiated by:  [name] Assessment:   78 yr-old female adm with NSTEMI     Action/Plan:   lives alone but has caregiver from 8am to 10pm. she has hx of hhc with [facility].   Anticipated DC Date:  11/13/2011   Anticipated DC Plan:  HOME W HOME HEALTH SERVICES  In-house referral  Clinical Social Worker      DC Planning Services  CM consult      [facility] Choice  HOME HEALTH   Choice offered to / List presented to:  C-1 Patient        HH arranged  HH-1 RN  HH-2 PT      [facility] agency  [facility]   Status of service:   Medicare Important Message given?   (If response is "NO", the following Medicare IM given date fields will be blank) Date Medicare IM given:   Date Additional Medicare IM given:    Discharge Disposition:  HOME W HOME HEALTH SERVICES  Per UR Regulation:  Reviewed for med. necessity/level of care/duration of stay  If discussed at Long Length of Stay Meetings, dates discussed:   11/10/2011  11/12/2011    Comments:  PCP:  Dr. Dwana Melena 8/1 10a debbie Inola Lisle rn,bsn sw and i went by to ck w pt to see if she wants hhc or poss short term snf. pt refueses snf. gave her hhc agency list for [place] no pref to hhc agency. is agreeable to hhrn and pt. amb in hall w card rehab and rw today. ref to [facility] w [facility] for hhc post dc. will ask md to put in hhc order. copy of hhc agency list on chart.  11/11/11 1450 Henrietta Mayo RN MSN CCM Pt ambulated 140' with cardiac rehab, "weak and somewhat SOB."  Discussed home health PT to build strength and endurance - pt states she is not sure, will think about it. Anticipates d/c to home with caregiver 11/12/11.  7/29 14:34p debbie Javier Gell rn,bsn went to cath lab, stent placed. combative and confused post  procedure.

## 2011-11-12 NOTE — Progress Notes (Signed)
Pt was transferred to 36W.  Report was given to RN. Pt was transferred with nurse tech.

## 2011-11-12 NOTE — Progress Notes (Signed)
CARDIAC REHAB PHASE I   PRE:  Rate/Rhythm: 84afib  BP:  Supine:   Sitting: 128/69  Standing:    SaO2: 94%RA  MODE:  Ambulation: 300 ft   POST:  Rate/Rhythem: 109AFIB  BP:  Supine:   Sitting: 137/73  Standing:    SaO2: 95%RA 0936-1010 Pt walked 300 ft on RA with rolling walker and asst x 1. Stopped once to rest. Tolerated well for age. Stated no CP but was slightly SOB at end of walk. States she feels SOB with any exertion even before admission. To recliner after walk. We will educate when care giver here which pt thinks will be beneficial.  Duanne Limerick

## 2011-11-12 NOTE — Progress Notes (Signed)
Subjective: Followup of diabetes management  Her blood sugars were unexpectedly lower yesterday and were mostly in the 80s. No hypoglycemic symptoms. She has been eating fairly well and vital signs are stable. Currently on twice a day Lantus along with mealtime NovoLog and correction doses. CBG (last 3)   Basename 11/12/11 0802 11/11/11 1616 11/11/11 1210  GLUCAP 179* 112* 92     Objective: Vital signs in last 24 hours: Temp:  [98 F (36.7 C)-98.8 F (37.1 C)] 98 F (36.7 C) (08/01 0700) Pulse Rate:  [68-95] 95  (08/01 0700) Resp:  [18-28] 24  (08/01 0700) BP: (100-144)/(48-73) 130/73 mmHg (08/01 0700) SpO2:  [89 %-98 %] 96 % (08/01 0700) Weight:  [74.2 kg (163 lb 9.3 oz)] 74.2 kg (163 lb 9.3 oz) (08/01 0500) Weight change: -0.8 kg (-1 lb 12.2 oz) Last BM Date: 11/11/11  Intake/Output from previous day: 07/31 0701 - 08/01 0700 In: 1283 [P.O.:1280; I.V.:3] Out: 1775 [Urine:1775] Intake/Output this shift:    Neurologic: Grossly normal, has intention tremor of the right hand   Lab Results:  [facility] 11/12/11 0513 11/11/11 0552  WBC 17.2* 20.3*  HGB 13.5 14.0  HCT 40.8 41.1  PLT 249 246   BMET  Basename 11/10/11 0600 11/09/11 0910  NA 137 138  K 3.5 4.6  CL 101 100  CO2 25 26  GLUCOSE 231* 351*  BUN 18 19  CREATININE 0.62 0.67  CALCIUM 10.0 10.0    Studies/Results: No results found.  Medications:  I have reviewed the patient's current medications. Scheduled:   . amLODipine  5 mg Oral Daily  . aspirin  81 mg Oral Daily  . clopidogrel  75 mg Oral Q breakfast  . colchicine  0.6 mg Oral Daily  . colesevelam  625 mg Oral TID  . ezetimibe  10 mg Oral Daily  . febuxostat  40 mg Oral Daily  . feeding supplement  237 mL Oral BID BM  . gabapentin  300 mg Oral TID  . insulin aspart  10 Units Subcutaneous Q supper  . insulin aspart  12 Units Subcutaneous BID WC  . insulin aspart  4-12 Units Subcutaneous TID AC  . insulin glargine  30 Units Subcutaneous QHS    . insulin glargine  48 Units Subcutaneous Daily  . irbesartan  150 mg Oral Daily  . magnesium oxide  400 mg Oral Daily  . metoprolol tartrate  25 mg Oral TID  . omega-3 acid ethyl esters  1 g Oral Daily  . prenatal multivitamin  1 tablet Oral Daily  . sodium chloride  3 mL Intravenous Q12H  . Vitamin D (Ergocalciferol)  50,000 Units Oral Q7 days  . warfarin  4 mg Oral ONCE-1800  . Warfarin - Pharmacist Dosing Inpatient   Does not apply q1800    Assessment/Plan:   improved blood sugar patterns. We'll continue the same insulin regimen for today Tremor: unclear etiology, currently also on metoprolol, does not complain of anxiety Improved blood pressure Currently only on Zetia for her hyperlipidemia   LOS: 8 days   [name] 11/12/2011, 8:12 AM

## 2011-11-12 NOTE — Progress Notes (Signed)
MEDICARE-CERTIFIED HOME HEALTH AGENCIES [place]   Agencies that are Medicare-Certified and affiliated with [facility]  Home Health Agency  Telephone Number Address  [facility]  The [facility] has ownership interest in this company; however, you are under no obligation to use this agency. [phone]  [address] [place], [place] [address]    Agencies that are Medicare-Certified and are not affiliated with The  [facility]   Home Health Agency Telephone Number Address  [facility] [phone] Fax [phone] [address] [place], [place]  [address]  [facility] [phone] [address] [place], [place] [address]  [facility]  [phone] Fax [phone] [address], [address]  [place], [place]  [address]  [facility] [phone] or [phone] [address] [place], [place] [address]  [facility] [phone] or [phone] [address], [address] [place], [place]  [address]    

## 2011-11-12 NOTE — Progress Notes (Signed)
ANTICOAGULATION CONSULT NOTE - Follow Up Consult  Pharmacy Consult for coumadin Indication: afib  Labs:  Basename 11/12/11 0513 11/11/11 0552 11/10/11 0600  HGB 13.5 14.0 --  HCT 40.8 41.1 39.7  PLT 249 246 273  APTT -- -- --  LABPROT 16.8* 14.9 14.9  INR 1.34 1.15 1.15  HEPARINUNFRC -- -- --  CREATININE -- -- 0.62  CKTOTAL -- -- --  CKMB -- -- --  TROPONINI -- -- --    Estimated Creatinine Clearance: 55.3 ml/min (by C-G formula based on Cr of 0.62).   Medications:     Assessment: [name] is a 78 yo F who is s/p cath with PCI. She has been on coumadin for her afib. Home coumadin dose 2.5mg  STTS and 4mg  MWF (INR=2.56 at admit). INR=1.3 today and noted plans for likely d/c without coumadin for two weeks.  Goal of Therapy:  INR = 2-3 Monitor platelets by anticoagulation protocol: Yes   Plan:  Will hold coumadin for now  Sheppard Coil, [place] D 11/12/2011 10:09 AM

## 2011-11-13 LAB — CBC
HCT: 41.8 % (ref 36.0–46.0)
MCHC: 33 g/dL (ref 30.0–36.0)
MCV: 95.2 fL (ref 78.0–100.0)
Platelets: 308 10*3/uL (ref 150–400)
RDW: 15.4 % (ref 11.5–15.5)
WBC: 18.8 10*3/uL — ABNORMAL HIGH (ref 4.0–10.5)

## 2011-11-13 LAB — BASIC METABOLIC PANEL
CO2: 24 mEq/L (ref 19–32)
Chloride: 103 mEq/L (ref 96–112)
Creatinine, Ser: 0.8 mg/dL (ref 0.50–1.10)
GFR calc Af Amer: 79 mL/min — ABNORMAL LOW (ref >90)
Potassium: 4.1 mEq/L (ref 3.5–5.1)
Sodium: 140 mEq/L (ref 135–145)

## 2011-11-13 LAB — GLUCOSE, CAPILLARY
Glucose-Capillary: 285 mg/dL — ABNORMAL HIGH (ref 70–99)
Glucose-Capillary: 206 mg/dL — ABNORMAL HIGH (ref 70–99)
Glucose-Capillary: 106 mg/dL — ABNORMAL HIGH (ref 70–99)

## 2011-11-13 MED ORDER — INSULIN PEN STARTER KIT
1.0000 | Freq: Once | Status: AC
  Administered 2011-11-13: 1
  Filled 2011-11-13: qty 1

## 2011-11-13 MED ORDER — INSULIN GLARGINE 100 UNIT/ML ~~LOC~~ SOLN
44.0000 [IU] | Freq: Every day | SUBCUTANEOUS | Status: DC
  Administered 2011-11-13 – 2011-11-14 (×2): 44 [IU] via SUBCUTANEOUS

## 2011-11-13 MED ORDER — INSULIN GLARGINE 100 UNIT/ML ~~LOC~~ SOLN
26.0000 [IU] | Freq: Every day | SUBCUTANEOUS | Status: DC
  Administered 2011-11-13: 26 [IU] via SUBCUTANEOUS

## 2011-11-13 NOTE — Progress Notes (Signed)
Cardiology Progress Note Patient Name: [name] Date of Encounter: 11/13/2011, 3:21 PM     Subjective  No overnight events. Patient feeling well this morning without complaints of chest pain or sob. She has been able to ambulate with cardiac rehab using her walker. Still weak, but improving. Recommendations were made for HHPT/RN (orders have been placed).   Objective   Telemetry: A.fib 60-100s  Medications: . amLODipine  5 mg Oral Daily  . aspirin  81 mg Oral Daily  . clopidogrel  75 mg Oral Q breakfast  . colchicine  0.6 mg Oral Daily  . colesevelam  625 mg Oral TID  . ezetimibe  10 mg Oral Daily  . febuxostat  40 mg Oral Daily  . feeding supplement  237 mL Oral BID BM  . Flexpen Starter Kit  1 kit Other Once  . gabapentin  300 mg Oral TID  . insulin aspart  10 Units Subcutaneous Q supper  . insulin aspart  12 Units Subcutaneous BID WC  . insulin aspart  4-12 Units Subcutaneous TID AC  . insulin glargine  26 Units Subcutaneous QHS  . insulin glargine  44 Units Subcutaneous Daily  . irbesartan  150 mg Oral Daily  . magnesium oxide  400 mg Oral Daily  . metoprolol tartrate  25 mg Oral TID  . omega-3 acid ethyl esters  1 g Oral Daily  . prenatal multivitamin  1 tablet Oral Daily  . sodium chloride  3 mL Intravenous Q12H  . Vitamin D (Ergocalciferol)  50,000 Units Oral Q7 days    Physical Exam: Temp:  [97.5 F (36.4 C)-98.2 F (36.8 C)] 98.2 F (36.8 C) (08/02 1400) Pulse Rate:  [62-106] 87  (08/02 1400) Resp:  [20] 20  (08/02 1400) BP: (120-149)/(75-84) 138/76 mmHg (08/02 1400) SpO2:  [94 %-95 %] 94 % (08/02 1400)  General: Overweight elderly female, in no acute distress. Head: Normocephalic, atraumatic, sclera non-icteric, nares are without discharge.  Neck: Supple. No JVD Lungs: Clear bilaterally to auscultation without wheezes, rales, or rhonchi. Breathing is unlabored. Heart: Irregular S1 S2 without murmurs, rubs, or gallops.  Abdomen: Soft, non-tender,  non-distended with normoactive bowel sounds. No rebound/guarding. No obvious abdominal masses. Msk:  Strength and tone appear normal for age. Extremities: No edema. No clubbing or cyanosis. Distal pedal pulses are intact and equal bilaterally. Neuro: Alert and oriented X 3. Moves all extremities spontaneously. Psych:  Responds to questions appropriately with a normal affect.   Intake/Output Summary (Last 24 hours) at 11/13/11 1521 Last data filed at 11/12/11 1700  Gross per 24 hour  Intake    120 ml  Output      0 ml  Net    120 ml    Labs:  Basename 11/13/11 0619 11/12/11 0513  WBC 18.8* 17.2*  HGB 13.8 13.5  HCT 41.8 40.8  MCV 95.2 95.1  PLT 308 249     11/12/2011 05:13  Prothrombin Time 16.8 (H)  INR 1.34   Radiology/Studies:   11/04/2011 - Chest Portable 1 View Findings: Cardiomegaly.  Prior CABG.  No infiltrates or failure. No pulmonary nodules are seen.  Bones are demineralized.  Similar appearance to priors.  IMPRESSION: Cardiomegaly, no active disease.     Assessment and Plan   1. NSTEMI: S/p BMS to SVG to Diagonal. Cont ASA, Plavix, BB, Zetia. Will complete one month of Plavix then stop.  2. Diabetes Mellitus: Insulin pump prior to this hospitalization. Will be dc'd  on Lantus and Novolog insulin pens per IM recommendations  3. Chronic Atrial Fibrillation: Rate controlled on metoprolol. INR 1.34. Coumadin on hold while on DAPT, resume in about two weeks as an outpatient.  4. Hypertension: Stable. Cont current meds  5. Hyperlipidemia: Intolerant to statins. Cont zetia and omega 3  6. Leukocytosis: Chronic with h/o CLL followed by oncology. Afebrile. No signs of infection.  Disposition: Patient declined SNF. Case management to assist with home health PT/RN. Likely home tomorrow.  Signed, HOPE, JESSICA PA-C  Patient stable.  She is alert, back to herself, and ambulating.  Plan dc in the am with early follow up.  Will convert over to warfarin after about 3 to 4  weeks.

## 2011-11-13 NOTE — Progress Notes (Signed)
Patient was on insulin pump prior to admission.  Being d/c'd home on insulin pens (Lantus Solostar and Novolog Flexpen).  Instructed patient on insulin pen use at home.  Showed patient how to place needle on insulin pen, how to do 2-unit airshot, how to dial up dose, how to give injection, and how to remove and dispose of needle.  Reviewed troubleshooting techniques with patient.  Reviewed contents of insulin pen starter kit with patient and gave patient a one-page pamphlet on insulin pen use.  Patient was able to successfully provide return demonstration of insulin pen.  RNs to provide reinforcement education at bedside.  Will follow. Ambrose Finland RN, MSN, CDE Diabetes Coordinator Inpatient Diabetes Program [phone]

## 2011-11-13 NOTE — Progress Notes (Signed)
Subjective: CBG (last 3)   Basename 11/13/11 0736 11/12/11 2101 11/12/11 1632  GLUCAP 79 138* 81    Sugars better  Objective: Vital signs in last 24 hours: Temp:  [97.5 F (36.4 C)-98.2 F (36.8 C)] 97.5 F (36.4 C) (08/02 0500) Pulse Rate:  [62-95] 62  (08/02 0500) Resp:  [19-26] 20  (08/01 1440) BP: (109-149)/(49-89) 147/79 mmHg (08/02 0500) SpO2:  [93 %-96 %] 94 % (08/02 0500) Weight change:  Last BM Date: 11/11/11  Intake/Output from previous day: 08/01 0701 - 08/02 0700 In: 840 [P.O.:840] Out: 225 [Urine:225] Intake/Output this shift:    General appearance: alert  Lab Results:  Basename 11/13/11 0619 11/12/11 0513  WBC 18.8* 17.2*  HGB 13.8 13.5  HCT 41.8 40.8  PLT 308 249   BMET No results found for this basename: NA:2,K:2,CL:2,CO2:2,GLUCOSE:2,BUN:2,CREATININE:2,CALCIUM:2 in the last 72 hours  Studies/Results: No results found.  Medications:  Scheduled:   . amLODipine  5 mg Oral Daily  . aspirin  81 mg Oral Daily  . clopidogrel  75 mg Oral Q breakfast  . colchicine  0.6 mg Oral Daily  . colesevelam  625 mg Oral TID  . ezetimibe  10 mg Oral Daily  . febuxostat  40 mg Oral Daily  . feeding supplement  237 mL Oral BID BM  . gabapentin  300 mg Oral TID  . insulin aspart  10 Units Subcutaneous Q supper  . insulin aspart  12 Units Subcutaneous BID WC  . insulin aspart  4-12 Units Subcutaneous TID AC  . insulin glargine  30 Units Subcutaneous QHS  . insulin glargine  48 Units Subcutaneous Daily  . irbesartan  150 mg Oral Daily  . magnesium oxide  400 mg Oral Daily  . metoprolol tartrate  25 mg Oral TID  . omega-3 acid ethyl esters  1 g Oral Daily  . prenatal multivitamin  1 tablet Oral Daily  . sodium chloride  3 mL Intravenous Q12H  . Vitamin D (Ergocalciferol)  50,000 Units Oral Q7 days  . DISCONTD: Warfarin - Pharmacist Dosing Inpatient   Does not apply q1800    Assessment/Plan: Go home on Lantus, Novolog; reduced Lantus doses for now; will  send Rx  LOS: 9 days   Wendell Fiebig 11/13/2011, 8:19 AM

## 2011-11-13 NOTE — Progress Notes (Signed)
CARDIAC REHAB PHASE I   PRE:  Rate/Rhythm: 95 Afib  BP:  Supine:   Sitting: Started walk from bathroom  Standing:    SaO2: 95 RA  MODE:  Ambulation: 460 ft   POST:  Rate/Rhythem: 108  BP:  Supine:   Sitting: 148/81  Standing:    SaO2: 96 RA 1125-1215 Assisted X 1 and used walker to ambulate.Gait fairly steady with walker. Pt"s legs weak and she states they feel weak.She was able to walk 460 feet without c/o of cp or SOB VS stable Completed MI  education with pt and caregiver. Pt declines Outpt. CRP not interested at present.  [name]

## 2011-11-13 NOTE — Progress Notes (Signed)
Patient had a 1.88 second pause @ 2138 on 11/12/11. Pt also had a 1.79 second pause @ 2112 on 11/12/11, will continue to monitor patient.

## 2011-11-13 NOTE — Progress Notes (Signed)
RN reviewed use of insulin pen with pt and caregiver.  Reviewed use of pen, air bubble release, and administration and sharps disposal.  Pt and caregiver return demonstrated administration of insulin via insulin pen.  Will continue to reinforce teaching.  Efraim Kaufmann

## 2011-11-14 ENCOUNTER — Encounter (HOSPITAL_COMMUNITY): Payer: Self-pay | Admitting: Physician Assistant

## 2011-11-14 LAB — CBC
Platelets: 313 10*3/uL (ref 150–400)
RBC: 4.32 MIL/uL (ref 3.87–5.11)
RDW: 15.3 % (ref 11.5–15.5)
WBC: 18 10*3/uL — ABNORMAL HIGH (ref 4.0–10.5)

## 2011-11-14 LAB — GLUCOSE, CAPILLARY
Glucose-Capillary: 102 mg/dL — ABNORMAL HIGH (ref 70–99)
Glucose-Capillary: 175 mg/dL — ABNORMAL HIGH (ref 70–99)

## 2011-11-14 MED ORDER — METOPROLOL TARTRATE 25 MG PO TABS: 25.0000 mg | ORAL_TABLET | Freq: Three times a day (TID) | ORAL | Status: DC

## 2011-11-14 MED ORDER — INSULIN ASPART 100 UNIT/ML ~~LOC~~ SOLN
10.0000 [IU] | Freq: Two times a day (BID) | SUBCUTANEOUS | Status: DC
  Administered 2011-11-14: 10 [IU] via SUBCUTANEOUS

## 2011-11-14 MED ORDER — POLYETHYLENE GLYCOL 3350 17 G PO PACK: 17.0000 g | PACK | Freq: Every day | ORAL | Status: DC | PRN

## 2011-11-14 MED ORDER — LOPERAMIDE HCL 2 MG PO CAPS: 2.0000 mg | ORAL_CAPSULE | ORAL | Status: DC | PRN

## 2011-11-14 MED ORDER — INSULIN ASPART 100 UNIT/ML ~~LOC~~ SOLN: SUBCUTANEOUS | Status: DC

## 2011-11-14 MED ORDER — AMLODIPINE BESYLATE 10 MG PO TABS: 5.0000 mg | ORAL_TABLET | Freq: Every day | ORAL | Status: DC

## 2011-11-14 MED ORDER — INSULIN GLARGINE 100 UNIT/ML ~~LOC~~ SOLN: SUBCUTANEOUS | Status: DC

## 2011-11-14 MED ORDER — ASPIRIN 81 MG PO TBEC: 81.0000 mg | DELAYED_RELEASE_TABLET | Freq: Every day | ORAL | Status: DC

## 2011-11-14 MED ORDER — CLOPIDOGREL BISULFATE 75 MG PO TABS: 75.0000 mg | ORAL_TABLET | Freq: Every day | ORAL | Status: DC

## 2011-11-14 MED ORDER — PROMETHAZINE HCL 12.5 MG PO TABS: 12.5000 mg | ORAL_TABLET | ORAL | Status: DC | PRN

## 2011-11-14 MED ORDER — COLESEVELAM HCL 625 MG PO TABS: 625.0000 mg | ORAL_TABLET | Freq: Three times a day (TID) | ORAL | Status: DC

## 2011-11-14 MED ORDER — ACETAMINOPHEN 325 MG PO TABS: 325.0000 mg | ORAL_TABLET | Freq: Four times a day (QID) | ORAL | Status: DC | PRN

## 2011-11-14 MED ORDER — INSULIN ASPART 100 UNIT/ML ~~LOC~~ SOLN: 12.0000 [IU] | Freq: Every day | SUBCUTANEOUS | Status: DC

## 2011-11-14 MED ORDER — NITROGLYCERIN 0.4 MG SL SUBL: 0.4000 mg | SUBLINGUAL_TABLET | SUBLINGUAL | Status: AC | PRN

## 2011-11-14 MED ORDER — COLCHICINE 0.6 MG PO TABS: 0.6000 mg | ORAL_TABLET | Freq: Every day | ORAL | Status: DC

## 2011-11-14 MED ORDER — EZETIMIBE 10 MG PO TABS: 10.0000 mg | ORAL_TABLET | Freq: Every day | ORAL | Status: DC

## 2011-11-14 MED ORDER — OLMESARTAN MEDOXOMIL 40 MG PO TABS: 20.0000 mg | ORAL_TABLET | Freq: Every day | ORAL | Status: DC

## 2011-11-14 NOTE — Discharge Summary (Signed)
Discharge Summary   Patient ID: [name] MRN: [id number], DOB/AGE: 09-09-1929 78 y.o. Admit date: 11/04/2011 D/C date:     11/14/2011  Primary Cardiologist: Dr. Riley Kill  Primary Discharge Diagnoses:  1. CAD with NSTEMI this admission s/p BMS to SVG->diagonal - CABG 1999 - stent to LCx 2009 - plan is to continue DAPT with ASA/Plavix then resume Coumadin when Plavix is discontinued in about 4 weeks 2. Acute delirium post cath, etiology unclear - possibly related to sedation vs pre-medication steroids 3. Diabetes mellitus 4. Chronic atrial fibrillation - see above re: anticoagulation 5. HL, intolerant to statin, started on Zetia/Welchol - will need f/u LFTs/lipids as OP  Secondary Discharge Diagnoses:  1. CLL (leukocytosis likely related to this) 2. HTN 3. Gout 4. Peripheral neuropathy 5. Esophageal reflux 6. H/o anemia 7. H/o GI bleed 8. Primary malignant neuroendocrine tumor of pancreas  9. H/o prior pancreatectomy in 2011 complicated by leak requiring prolong and repeated drainage and subsequent pancreatic stent   Hospital Course: 78 y/o F with hx of CAD, atrial fibrillation, primary malignant neuroendocrine tumor of pancreas, and CLL. She has had multiple follow -up visits to the clinic regarding her afib and rate was controlled on bystolic. She was started on warfarin on 7/4 after being cleared by her GI and oncologist and have been following up INR at our office. She presented on 11/04/11 with complaints of CP radiating to her neck similar to her first MI, associated with mild nausea, diaphoresis and mild hand tremors. NTG SL spray did not relieve the pain so she called EMS. SL NTG relieved the pain. EKG demonstrated atrial fib with ST-T wave abnormality with consideration for inferolateral ischemia. Her first troponin was positive. Coumadin was held in anticipation for cardiac cath. Troponins peaked at 3.65. Due to persistently elevated INR, cath was postponed until the weekend  had passed. She also required premedication for IV dye allergy. Cath was performed 11/09/11 demonstrating: Final Conclusions:  1. Single vessel occlusive coronary disease. Continued patency of the stent in the mid left circumflex.  2. Severe stenosis in the saphenous vein graft to the diagonal.  3. Patent LIMA graft to the LAD.  4. Good left ventricular function with moderate mitral insufficiency.  5. Successful intracoronary stenting of the saphenous vein graft to the diagonal with a bare-metal stent. Given her BMS to SVG->diag, DAPT was recommended. Post-procedurally, the patient was noted to have acute confusion. She was disoriented and combative, attempting to bite. She was placed in 4-pt restraints given that the femoral sheath was still in place. She was oriented to self but not situation or location. Aside from disorientation there was no focal neurologic abnormality. Dr. [place] felt her acute delirium was related to the sedation in the lab or potentially the prednisone used for premedication of her IV dye allergy. IV haldol did not help. She was transferred to the [facility] with IV Precedex for sedation. Her mental status did improve by the following day. By afternoon of 11/10/11, her mentation was back to near normal. She was seen by cardiac rehab. It was felt she would benefit from home health services including PT/RN. She refused short term SNF and was felt in sound state of mind to do so. She was observed over the next few days with progression of activity. Today she is feeling well. Dr. Jens Som has seem and examined her today and feels she is stable for discharge. The plan re: anticoagulation is to hold Coumadin while on DAPT, and resume  in about 4 weeks after Plavix is discontinued as an outpatient. See below for comprehensive med list. Of note she was on Avapro in hospital which was a formulary substitution; due to the conversion & stable BP's her benicar was cut down at discharge.  In regards to  her diabetes mellitus, Dr. Lucianne Muss was consulted because she was on an insulin pump at home. He changed her to Lantus and novolog. She was educated by diabetes coordinator on how to use insulin pens.   Discharge Vitals: Blood pressure 111/73, pulse 86, temperature 98.8 F (37.1 C), temperature source Oral, resp. rate 20, height 5\' 4"  (1.626 m), weight 163 lb 9.3 oz (74.2 kg), SpO2 94.00%.  Labs: Lab Results  Component Value Date   WBC 18.0* 11/14/2011   HGB 13.5 11/14/2011   HCT 41.2 11/14/2011   MCV 95.4 11/14/2011   PLT 313 11/14/2011     Lab 11/13/11 1912  NA 140  K 4.1  CL 103  CO2 24  BUN 24*  CREATININE 0.80  CALCIUM 10.2  PROT --  BILITOT --  ALKPHOS --  ALT --  AST --  GLUCOSE 170*    Lab Results  Component Value Date   CHOL 257* 11/05/2011   HDL 37* 11/05/2011   LDLCALC UNABLE TO CALCULATE IF TRIGLYCERIDE OVER 400 mg/dL 1/61/0960   TRIG 454* 0/98/1191    Diagnostic Studies/Procedures   -Dg Chest Portable 1 View 11/04/2011  *RADIOLOGY REPORT*  Clinical Data: Chest pain.  Pancreatic neoplasm.  PORTABLE CHEST - 1 VIEW  Comparison: 11/15/2011most recent.  Multiple priors.  Findings: Cardiomegaly.  Prior CABG.  No infiltrates or failure. No pulmonary nodules are seen.  Bones are demineralized.  Similar appearance to priors.  IMPRESSION: Cardiomegaly, no active disease.  Original Report Authenticated By: Elsie Stain, M.D.   -2D Echo 11/05/11 Study Conclusions - Left ventricle: The cavity size was normal. Wall thickness was increased in a pattern of mild LVH. Systolic function was normal. The estimated ejection fraction was in the range of 55% to 60%. Indeterminant diastolic function. Although no diagnostic regional wall motion abnormality was identified, this possibility cannot be completely excluded on the basis of this study. - Aortic valve: There was no stenosis. - Mitral valve: No significant regurgitation. - Left atrium: The atrium was mildly dilated. - Right ventricle:  The cavity size was normal. Systolic function was normal. - Right atrium: The atrium was mildly dilated. - Tricuspid valve: Peak RV-RA gradient:12mm Hg (S). - Systemic veins: IVC was not visualized. Impressions: - Normal LV size with mild LV hypertrophy. EF 55-60%. Normal RV size and systolic function. Biatrial enlargement.  - Cardiac catheterization this admission, please see full report and above for summary.  Discharge Medications   Medication List  As of 11/14/2011  3:02 PM   STOP taking these medications         BYSTOLIC 5 MG tablet      NITROLINGUAL 0.4 MG/SPRAY spray      warfarin 2.5 MG tablet         TAKE these medications         acetaminophen 325 MG tablet   Commonly known as: TYLENOL   Take 1 tablet (325 mg total) by mouth every 6 (six) hours as needed for pain or fever (do not exceed 4000mg  in 24 hours).      amLODipine 10 MG tablet   Commonly known as: NORVASC   Take 0.5 tablets (5 mg total) by mouth daily.  aspirin 81 MG EC tablet   Take 1 tablet (81 mg total) by mouth daily.      clopidogrel 75 MG tablet   Commonly known as: PLAVIX   Take 1 tablet (75 mg total) by mouth daily with breakfast.      colchicine 0.6 MG tablet   Take 1 tablet (0.6 mg total) by mouth daily. Please call your primary doctor to ask if you should still continue to take this. Pharmacy left a note stating that your primary doctor may have planned to stop this at some point, so please clarify this with Dr. [name].      colesevelam 625 MG tablet   Commonly known as: WELCHOL   Take 1 tablet (625 mg total) by mouth 3 (three) times daily.      ezetimibe 10 MG tablet   Commonly known as: ZETIA   Take 1 tablet (10 mg total) by mouth daily.      febuxostat 40 MG tablet   Commonly known as: ULORIC   Take 40 mg by mouth daily.      Fish Oil 1000 MG Caps   Take by mouth daily.      insulin aspart 100 UNIT/ML injection   Commonly known as: novoLOG   FlexPen - Inject into skin  subcutaneously. 10 units with breakfast, 10 units with lunch, and 12 units with supper.      insulin glargine 100 UNIT/ML injection   Commonly known as: LANTUS   Solostar Pen - Inject into skin subcutaneously. 44 units daily in AM and 26 units at bedtime.      loperamide 2 MG capsule   Commonly known as: IMODIUM   Take 1 capsule (2 mg total) by mouth as needed for diarrhea or loose stools (as directed by package insert, up to 2 doses).      Magnesium 250 MG Tabs   Take 500 mg by mouth daily.      metoprolol tartrate 25 MG tablet   Commonly known as: LOPRESSOR   Take 1 tablet (25 mg total) by mouth 3 (three) times daily.      NEURONTIN 300 MG capsule   Generic drug: gabapentin   Take 300 mg by mouth 3 (three) times daily.      nitroGLYCERIN 0.4 MG SL tablet   Commonly known as: NITROSTAT   Place 1 tablet (0.4 mg total) under the tongue every 5 (five) minutes x 3 doses as needed for chest pain.      olmesartan 40 MG tablet   Commonly known as: BENICAR   Take 0.5 tablets (20 mg total) by mouth daily.      polyethylene glycol packet   Commonly known as: MIRALAX / GLYCOLAX   Take 17 g by mouth daily as needed (constipation).      PRENATAL VITAMINS 0.8 MG tablet   Take 1 tablet by mouth daily.      promethazine 12.5 MG tablet   Commonly known as: PHENERGAN   Take 1 tablet (12.5 mg total) by mouth as needed for nausea.      traMADol 50 MG tablet   Commonly known as: ULTRAM   Take 50 mg by mouth daily as needed. pain      Vitamin D3 50000 UNITS Caps   Take 50,000 Units by mouth every 7 (seven) days. TAKES ON THURSDAYS            Disposition   The patient will be discharged in stable condition to home. Discharge Orders  Future Appointments: Provider: Department: Dept Phone: Center:   12/15/2011 11:00 AM Malissa Hippo, MD [facility] [phone] None   02/29/2012 11:45 AM Rachael Fee [place] [phone] None   02/29/2012 12:15 PM Josph Macho, MD [facility] [phone] None     Future Orders Please Complete By Expires   Diet - low sodium heart healthy      Scheduling Instructions:   Diabetic Diet   Increase activity slowly      Comments:   No driving until cleared by your doctor. No lifting over 5 lbs for 2 weeks. No sexual activity for 2 weeks. Keep procedure site clean & dry. If you notice increased pain, swelling, bleeding or pus, call/return!  You may shower, but no soaking baths/hot tubs/pools for 1 week.     Follow-up Information    Follow up with Shawnie Pons, MD. (Our office will call you to arrange appointment)    Contact information:   [address] [place] [address] [phone]            Duration of Discharge Encounter: Greater than 30 minutes including physician and PA time.  Signed, Dovie Kapusta PA-C 11/14/2011, 3:02 PM

## 2011-11-14 NOTE — Discharge Summary (Signed)
See progress notes [name]  

## 2011-11-14 NOTE — Progress Notes (Signed)
Checked in with pt from cardiac rehab. Pt states she is being discharged today. Did not ambulate pt.  Electronically signed by Harriett Sine MS on Saturday November 14 2011 at 1158

## 2011-11-14 NOTE — Progress Notes (Signed)
Cardiology Progress Note Patient Name: [name] Date of Encounter: 11/14/2011, 11:45 AM     Subjective  No chest pain or dyspnea   Objective   Telemetry: A.fib rate controlled.  Medications: . amLODipine  5 mg Oral Daily  . aspirin  81 mg Oral Daily  . clopidogrel  75 mg Oral Q breakfast  . colchicine  0.6 mg Oral Daily  . colesevelam  625 mg Oral TID  . ezetimibe  10 mg Oral Daily  . febuxostat  40 mg Oral Daily  . feeding supplement  237 mL Oral BID BM  . Flexpen Starter Kit  1 kit Other Once  . gabapentin  300 mg Oral TID  . insulin aspart  10 Units Subcutaneous Q supper  . insulin aspart  12 Units Subcutaneous BID WC  . insulin aspart  4-12 Units Subcutaneous TID AC  . insulin glargine  26 Units Subcutaneous QHS  . insulin glargine  44 Units Subcutaneous Daily  . irbesartan  150 mg Oral Daily  . magnesium oxide  400 mg Oral Daily  . metoprolol tartrate  25 mg Oral TID  . omega-3 acid ethyl esters  1 g Oral Daily  . prenatal multivitamin  1 tablet Oral Daily  . sodium chloride  3 mL Intravenous Q12H  . Vitamin D (Ergocalciferol)  50,000 Units Oral Q7 days    Physical Exam: Temp:  [98.2 F (36.8 C)-98.8 F (37.1 C)] 98.8 F (37.1 C) (08/03 0500) Pulse Rate:  [78-106] 106  (08/03 0940) Resp:  [20] 20  (08/02 1400) BP: (111-145)/(73-79) 111/73 mmHg (08/03 0940) SpO2:  [94 %-95 %] 94 % (08/03 0500)  General: wd wn NAD Head: Normal  Neck: Supple.  Lungs: CTA Heart: Irregular S1 S2 without murmurs, rubs, or gallops.  Abdomen: Soft, non-tender, non-distended  Extremities: No edema. Neuro: Alert and oriented X 3. Moves all extremities spontaneously.    Intake/Output Summary (Last 24 hours) at 11/14/11 1145 Last data filed at 11/14/11 0944  Gross per 24 hour  Intake    203 ml  Output      0 ml  Net    203 ml    Labs:  [facility] 11/13/11 0619 11/12/11 0513  WBC 18.8* 17.2*  HGB 13.8 13.5  HCT 41.8 40.8  MCV 95.2 95.1  PLT 308 249     11/12/2011 05:13  Prothrombin Time 16.8 (H)  INR 1.34   Radiology/Studies:   11/04/2011 - Chest Portable 1 View Findings: Cardiomegaly.  Prior CABG.  No infiltrates or failure. No pulmonary nodules are seen.  Bones are demineralized.  Similar appearance to priors.  IMPRESSION: Cardiomegaly, no active disease.     Assessment and Plan   1. NSTEMI: S/p BMS to SVG to Diagonal. Cont ASA, Plavix, BB, Zetia. Will complete one month of Plavix then stop.  2. Diabetes Mellitus: Insulin pump prior to this hospitalization. Will be dc'd on Lantus and Novolog insulin pens per IM recommendations  3. Chronic Atrial Fibrillation: Rate controlled on metoprolol. Coumadin on hold while on DAPT, resume in about 4 weeks after plavix dced as an outpatient.  4. Hypertension: Stable. Cont current meds  5. Hyperlipidemia: Intolerant to statins. Cont zetia and omega 3  6. Leukocytosis: Chronic with h/o CLL followed by oncology. Afebrile. No signs of infection.  Plan DC today and fu with Dr Riley Kill in 2-4 weeks >30 min PA and physician time D2  Signed, Olga Millers MD 11:52 AM

## 2011-11-16 ENCOUNTER — Ambulatory Visit: Payer: Medicare Other | Admitting: Cardiology

## 2011-11-16 DIAGNOSIS — I10 Essential (primary) hypertension: Secondary | ICD-10-CM

## 2011-11-16 DIAGNOSIS — I2510 Atherosclerotic heart disease of native coronary artery without angina pectoris: Secondary | ICD-10-CM

## 2011-11-16 DIAGNOSIS — I4891 Unspecified atrial fibrillation: Secondary | ICD-10-CM

## 2011-11-24 ENCOUNTER — Emergency Department (HOSPITAL_COMMUNITY): Payer: Medicare Other

## 2011-11-24 ENCOUNTER — Encounter (HOSPITAL_COMMUNITY): Payer: Self-pay | Admitting: Emergency Medicine

## 2011-11-24 ENCOUNTER — Inpatient Hospital Stay (HOSPITAL_COMMUNITY)
Admission: EM | Admit: 2011-11-24 | Discharge: 2011-11-25 | DRG: 303 | Disposition: A | Discharge status: Home Health | Payer: Medicare Other | Attending: Cardiology | Admitting: Cardiology

## 2011-11-24 DIAGNOSIS — I10 Essential (primary) hypertension: Secondary | ICD-10-CM | POA: Diagnosis present

## 2011-11-24 DIAGNOSIS — F3289 Other specified depressive episodes: Secondary | ICD-10-CM | POA: Diagnosis present

## 2011-11-24 DIAGNOSIS — I252 Old myocardial infarction: Secondary | ICD-10-CM

## 2011-11-24 DIAGNOSIS — I209 Angina pectoris, unspecified: Secondary | ICD-10-CM | POA: Diagnosis not present

## 2011-11-24 DIAGNOSIS — R0609 Other forms of dyspnea: Secondary | ICD-10-CM

## 2011-11-24 DIAGNOSIS — E109 Type 1 diabetes mellitus without complications: Secondary | ICD-10-CM | POA: Diagnosis present

## 2011-11-24 DIAGNOSIS — Z794 Long term (current) use of insulin: Secondary | ICD-10-CM

## 2011-11-24 DIAGNOSIS — I214 Non-ST elevation (NSTEMI) myocardial infarction: Secondary | ICD-10-CM | POA: Diagnosis present

## 2011-11-24 DIAGNOSIS — M109 Gout, unspecified: Secondary | ICD-10-CM | POA: Diagnosis present

## 2011-11-24 DIAGNOSIS — G609 Hereditary and idiopathic neuropathy, unspecified: Secondary | ICD-10-CM | POA: Diagnosis present

## 2011-11-24 DIAGNOSIS — K219 Gastro-esophageal reflux disease without esophagitis: Secondary | ICD-10-CM | POA: Diagnosis present

## 2011-11-24 DIAGNOSIS — E7800 Pure hypercholesterolemia, unspecified: Secondary | ICD-10-CM | POA: Diagnosis present

## 2011-11-24 DIAGNOSIS — R079 Chest pain, unspecified: Secondary | ICD-10-CM

## 2011-11-24 DIAGNOSIS — R0600 Dyspnea, unspecified: Secondary | ICD-10-CM

## 2011-11-24 DIAGNOSIS — Z7902 Long term (current) use of antithrombotics/antiplatelets: Secondary | ICD-10-CM

## 2011-11-24 DIAGNOSIS — I4891 Unspecified atrial fibrillation: Secondary | ICD-10-CM | POA: Diagnosis present

## 2011-11-24 DIAGNOSIS — I2510 Atherosclerotic heart disease of native coronary artery without angina pectoris: Principal | ICD-10-CM | POA: Diagnosis present

## 2011-11-24 DIAGNOSIS — Z951 Presence of aortocoronary bypass graft: Secondary | ICD-10-CM

## 2011-11-24 DIAGNOSIS — I4821 Permanent atrial fibrillation: Secondary | ICD-10-CM | POA: Diagnosis present

## 2011-11-24 DIAGNOSIS — F329 Major depressive disorder, single episode, unspecified: Secondary | ICD-10-CM | POA: Diagnosis present

## 2011-11-24 DIAGNOSIS — Z9861 Coronary angioplasty status: Secondary | ICD-10-CM

## 2011-11-24 DIAGNOSIS — E119 Type 2 diabetes mellitus without complications: Secondary | ICD-10-CM

## 2011-11-24 DIAGNOSIS — C9110 Chronic lymphocytic leukemia of B-cell type not having achieved remission: Secondary | ICD-10-CM | POA: Diagnosis present

## 2011-11-24 DIAGNOSIS — E785 Hyperlipidemia, unspecified: Secondary | ICD-10-CM | POA: Diagnosis present

## 2011-11-24 DIAGNOSIS — Z7982 Long term (current) use of aspirin: Secondary | ICD-10-CM

## 2011-11-24 DIAGNOSIS — Z79899 Other long term (current) drug therapy: Secondary | ICD-10-CM

## 2011-11-24 HISTORY — DX: Permanent atrial fibrillation: I48.21

## 2011-11-24 HISTORY — DX: Major depressive disorder, single episode, unspecified: F32.9

## 2011-11-24 HISTORY — DX: Non-ST elevation (NSTEMI) myocardial infarction: I21.4

## 2011-11-24 HISTORY — DX: Other forms of dyspnea: R06.09

## 2011-11-24 HISTORY — DX: Angina pectoris, unspecified: I20.9

## 2011-11-24 HISTORY — DX: Dyspnea, unspecified: R06.00

## 2011-11-24 HISTORY — DX: Depression, unspecified: F32.A

## 2011-11-24 HISTORY — DX: Acute myocardial infarction, unspecified: I21.9

## 2011-11-24 LAB — CBC WITH DIFFERENTIAL/PLATELET
Basophils Absolute: 0.1 10*3/uL (ref 0.0–0.1)
Basophils Relative: 0 % (ref 0–1)
Eosinophils Absolute: 0.5 10*3/uL (ref 0.0–0.7)
Eosinophils Relative: 3 % (ref 0–5)
MCH: 31.7 pg (ref 26.0–34.0)
MCV: 96.7 fL (ref 78.0–100.0)
Neutrophils Relative %: 65 % (ref 43–77)
Platelets: 384 10*3/uL (ref 150–400)
RDW: 15.7 % — ABNORMAL HIGH (ref 11.5–15.5)
WBC: 17.6 10*3/uL — ABNORMAL HIGH (ref 4.0–10.5)

## 2011-11-24 LAB — GLUCOSE, CAPILLARY

## 2011-11-24 LAB — BASIC METABOLIC PANEL
Calcium: 10.1 mg/dL (ref 8.4–10.5)
GFR calc Af Amer: 60 mL/min — ABNORMAL LOW (ref >90)
GFR calc non Af Amer: 52 mL/min — ABNORMAL LOW (ref >90)
Sodium: 137 mEq/L (ref 135–145)

## 2011-11-24 LAB — CARDIAC PANEL(CRET KIN+CKTOT+MB+TROPI)
CK, MB: 2.5 ng/mL (ref 0.3–4.0)
Total CK: 55 U/L (ref 7–177)

## 2011-11-24 LAB — HEPARIN LEVEL (UNFRACTIONATED): Heparin Unfractionated: 0.58 IU/mL (ref 0.30–0.70)

## 2011-11-24 LAB — TROPONIN I: Troponin I: 3.96 ng/mL (ref <0.30)

## 2011-11-24 MED ORDER — HEPARIN (PORCINE) IN NACL 100-0.45 UNIT/ML-% IJ SOLN
1000.0000 [IU]/h | INTRAMUSCULAR | Status: DC
  Administered 2011-11-24 – 2011-11-25 (×2): 1000 [IU]/h via INTRAVENOUS
  Filled 2011-11-24 (×3): qty 250

## 2011-11-24 MED ORDER — EZETIMIBE 10 MG PO TABS
10.0000 mg | ORAL_TABLET | Freq: Every day | ORAL | Status: DC
  Administered 2011-11-24 – 2011-11-25 (×2): 10 mg via ORAL
  Filled 2011-11-24 (×2): qty 1

## 2011-11-24 MED ORDER — INSULIN GLARGINE 100 UNIT/ML ~~LOC~~ SOLN: 26.0000 [IU] | Freq: Two times a day (BID) | SUBCUTANEOUS | Status: DC

## 2011-11-24 MED ORDER — ACETAMINOPHEN 325 MG PO TABS
650.0000 mg | ORAL_TABLET | ORAL | Status: DC | PRN
  Filled 2011-11-24: qty 2

## 2011-11-24 MED ORDER — GABAPENTIN 300 MG PO CAPS
300.0000 mg | ORAL_CAPSULE | Freq: Three times a day (TID) | ORAL | Status: DC
  Administered 2011-11-24 – 2011-11-25 (×2): 300 mg via ORAL
  Filled 2011-11-24 (×4): qty 1

## 2011-11-24 MED ORDER — ONDANSETRON HCL 4 MG/2ML IJ SOLN: 4.0000 mg | Freq: Four times a day (QID) | INTRAMUSCULAR | Status: DC | PRN

## 2011-11-24 MED ORDER — HEPARIN BOLUS VIA INFUSION
4000.0000 [IU] | Freq: Once | INTRAVENOUS | Status: AC
  Administered 2011-11-24: 4000 [IU] via INTRAVENOUS

## 2011-11-24 MED ORDER — MAGNESIUM GLUCONATE 500 MG PO TABS
500.0000 mg | ORAL_TABLET | Freq: Two times a day (BID) | ORAL | Status: DC
  Administered 2011-11-24 – 2011-11-25 (×2): 500 mg via ORAL
  Filled 2011-11-24 (×3): qty 1

## 2011-11-24 MED ORDER — METOPROLOL TARTRATE 25 MG PO TABS
25.0000 mg | ORAL_TABLET | Freq: Three times a day (TID) | ORAL | Status: DC
  Administered 2011-11-24 – 2011-11-25 (×2): 25 mg via ORAL
  Filled 2011-11-24 (×4): qty 1

## 2011-11-24 MED ORDER — SODIUM CHLORIDE 0.9 % IV SOLN: 250.0000 mL | INTRAVENOUS | Status: DC | PRN

## 2011-11-24 MED ORDER — INSULIN GLARGINE 100 UNIT/ML ~~LOC~~ SOLN
44.0000 [IU] | Freq: Every day | SUBCUTANEOUS | Status: DC
  Administered 2011-11-24: 44 [IU] via SUBCUTANEOUS

## 2011-11-24 MED ORDER — COLCHICINE 0.6 MG PO TABS
0.6000 mg | ORAL_TABLET | Freq: Every day | ORAL | Status: DC
  Administered 2011-11-25: 0.6 mg via ORAL
  Filled 2011-11-24 (×2): qty 1

## 2011-11-24 MED ORDER — TRAMADOL HCL 50 MG PO TABS: 50.0000 mg | ORAL_TABLET | Freq: Every day | ORAL | Status: DC | PRN

## 2011-11-24 MED ORDER — COLESEVELAM HCL 625 MG PO TABS
625.0000 mg | ORAL_TABLET | Freq: Three times a day (TID) | ORAL | Status: DC
  Administered 2011-11-24 – 2011-11-25 (×2): 625 mg via ORAL
  Filled 2011-11-24 (×4): qty 1

## 2011-11-24 MED ORDER — ASPIRIN EC 81 MG PO TBEC
81.0000 mg | DELAYED_RELEASE_TABLET | Freq: Every day | ORAL | Status: DC
  Administered 2011-11-25: 81 mg via ORAL
  Filled 2011-11-24 (×2): qty 1

## 2011-11-24 MED ORDER — PROMETHAZINE HCL 25 MG RE SUPP: 25.0000 mg | Freq: Four times a day (QID) | RECTAL | Status: DC | PRN

## 2011-11-24 MED ORDER — SODIUM CHLORIDE 0.9 % IJ SOLN: 3.0000 mL | INTRAMUSCULAR | Status: DC | PRN

## 2011-11-24 MED ORDER — SODIUM CHLORIDE 0.9 % IV SOLN
Freq: Once | INTRAVENOUS | Status: AC
  Administered 2011-11-24: 13:00:00 via INTRAVENOUS

## 2011-11-24 MED ORDER — INSULIN GLARGINE 100 UNIT/ML ~~LOC~~ SOLN
26.0000 [IU] | Freq: Every morning | SUBCUTANEOUS | Status: DC
  Administered 2011-11-25: 26 [IU] via SUBCUTANEOUS

## 2011-11-24 MED ORDER — POLYETHYLENE GLYCOL 3350 17 G PO PACK
17.0000 g | PACK | Freq: Every day | ORAL | Status: DC | PRN
  Filled 2011-11-24: qty 1

## 2011-11-24 MED ORDER — AMLODIPINE BESYLATE 5 MG PO TABS
5.0000 mg | ORAL_TABLET | Freq: Every day | ORAL | Status: DC
  Administered 2011-11-24 – 2011-11-25 (×2): 5 mg via ORAL
  Filled 2011-11-24 (×2): qty 1

## 2011-11-24 MED ORDER — INSULIN ASPART 100 UNIT/ML ~~LOC~~ SOLN: 10.0000 [IU] | Freq: Three times a day (TID) | SUBCUTANEOUS | Status: DC | PRN

## 2011-11-24 MED ORDER — NITROGLYCERIN 0.4 MG SL SUBL: 0.4000 mg | SUBLINGUAL_TABLET | SUBLINGUAL | Status: DC | PRN

## 2011-11-24 MED ORDER — FEBUXOSTAT 40 MG PO TABS
40.0000 mg | ORAL_TABLET | Freq: Every day | ORAL | Status: DC
  Administered 2011-11-25: 40 mg via ORAL
  Filled 2011-11-24 (×2): qty 1

## 2011-11-24 MED ORDER — SODIUM CHLORIDE 0.9 % IJ SOLN
3.0000 mL | Freq: Two times a day (BID) | INTRAMUSCULAR | Status: DC
  Administered 2011-11-24: 3 mL via INTRAVENOUS

## 2011-11-24 MED ORDER — CLOPIDOGREL BISULFATE 75 MG PO TABS
75.0000 mg | ORAL_TABLET | Freq: Every day | ORAL | Status: DC
Start: 2011-11-25 — End: 2011-11-25
  Administered 2011-11-25: 75 mg via ORAL
  Filled 2011-11-24: qty 1

## 2011-11-24 MED ORDER — MAGNESIUM 500 MG PO TABS: 1.0000 | ORAL_TABLET | Freq: Two times a day (BID) | ORAL | Status: DC

## 2011-11-24 MED ORDER — IRBESARTAN 300 MG PO TABS
300.0000 mg | ORAL_TABLET | Freq: Every day | ORAL | Status: DC
  Administered 2011-11-25: 300 mg via ORAL
  Filled 2011-11-24 (×2): qty 1

## 2011-11-24 NOTE — Progress Notes (Signed)
ANTICOAGULATION CONSULT NOTE - Initial Consult  Pharmacy Consult for Heparin Indication: chest pain/ACS  Allergies  Allergen Reactions  . Ivp Dye (Iodinated Diagnostic Agents) Other (See Comments)    Burst veins in her arm.   . Codeine Nausea And Vomiting  . Morphine Other (See Comments)    Not in Right State of Mind.   . Statins Other (See Comments)    Leg Cramps.   . Prednisone Other (See Comments)    Patient became delirious/combative when given 60mg  dose    Patient Measurements: Height: 5\' 4"  (162.6 cm) Weight: 163 lb (73.936 kg) IBW/kg (Calculated) : 54.7  Heparin Dosing Weight: 70 kg  Vital Signs: Temp: 98.6 F (37 C) (08/13 1626) Temp src: Oral (08/13 1626) BP: 158/76 mmHg (08/13 1626) Pulse Rate: 80  (08/13 1626)  Labs:  Basename 11/24/11 1215  HGB 13.4  HCT 40.9  PLT 384  APTT --  LABPROT --  INR --  HEPARINUNFRC --  CREATININE 1.00  CKTOTAL --  CKMB --  TROPONINI 3.96*    Estimated Creatinine Clearance: 44.2 ml/min (by C-G formula based on Cr of 1).   Medical History: Past Medical History  Diagnosis Date  . Other chronic pulmonary heart diseases   . HTN (hypertension)   . Coronary atherosclerosis of unspecified type of vessel, native or graft     a. remote CABG in 1999 with Dr. Laneta Simmers. b. s/p PCI with stent to LCX in 2009 (Platinum protocol study). c. NSTEMI 10/2011 s/p BMS to SVG->diagonal.  . Personal history of other diseases of digestive system   . Gout, unspecified   . Unspecified hereditary and idiopathic peripheral neuropathy   . Esophageal reflux   . Anemia   . GI bleed   . Primary pancreatic carcinoid tumor   . 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 Leukocytosis 03/16/2011  . Hyperlipidemia     Statin intolerant  . CLL (chronic lymphocytic leukemia)     seeing Dr. [name]  . Pancreas disorder     prior pancreatectomy in 2011 complicated by leak requiring prolong and repeated drainage and subsequent  pancreatic stent  . Type I (juvenile type) diabetes mellitus without mention of complication, not stated as uncontrolled     Previously on insulin pump  . Permanent atrial fibrillation     Medications:  Scheduled:    . sodium chloride   Intravenous Once  . amLODipine  5 mg Oral Daily  . aspirin EC  81 mg Oral Daily  . clopidogrel  75 mg Oral Q breakfast  . colchicine  0.6 mg Oral Daily  . colesevelam  625 mg Oral TID  . ezetimibe  10 mg Oral Daily  . febuxostat  40 mg Oral Daily  . gabapentin  300 mg Oral TID  . heparin  4,000 Units Intravenous Once  . insulin glargine  26 Units Subcutaneous q morning - 10a  . insulin glargine  44 Units Subcutaneous QHS  . irbesartan  300 mg Oral Daily  . magnesium gluconate  500 mg Oral BID  . metoprolol tartrate  25 mg Oral TID  . sodium chloride  3 mL Intravenous Q12H  . DISCONTD: insulin glargine  26-44 Units Subcutaneous BID  . DISCONTD: Magnesium  1 tablet Oral BID    Assessment: Pt was started on heparin at [facility], 4000 units bolus and 1000 units/hr, and then transferred to [facility] 1st troponin came back 3.96. Goal of Therapy:  Heparin level 0.3-0.7 units/ml Monitor platelets by  anticoagulation protocol: Yes   Plan:  Will continue heparin at the same rate. Will check an 8 hr heparin level and adjust as needed. Daily heparin level and CBC while on heparin.  Eugene Garnet 11/24/2011,6:50 PM

## 2011-11-24 NOTE — ED Notes (Signed)
CRITICAL VALUE ALERT  Critical value received:  Troponin 3.96  Date of notification:  11/24/11  Time of notification:  1315  Critical value read back:yes  Nurse who received alert:  t abbott rn  MD notified (1st page):  [place]  Time of first page: 1315  MD notified (2nd page):  Time of second page:  Responding MD:   Time MD responded:

## 2011-11-24 NOTE — ED Provider Notes (Addendum)
History   This chart was scribed for Amanda Lennert, MD by Charolett Bumpers . The patient was seen in room APA04/APA04. Patient's care was started at 1210.    CSN: [id number]  Arrival date & time 11/24/11  1155   First MD Initiated Contact with Patient 11/24/11 1157      Chief Complaint  Patient presents with  . Chest Pain    (Consider location/radiation/quality/duration/timing/severity/associated sxs/prior treatment) Patient is a 78 y.o. female presenting with chest pain. The history is provided by the patient. No language interpreter was used.  Chest Pain The chest pain began less than 1 hour ago. Chest pain occurs intermittently. The chest pain is resolved. The pain is associated with stress. At its most intense, the pain is at 7/10. The pain is currently at 0/10. Primary symptoms include shortness of breath. Pertinent negatives for primary symptoms include no fatigue, no cough, no abdominal pain and no dizziness.  Associated symptoms include diaphoresis.  Pertinent negatives for past medical history include no seizures.    [name] is a 78 y.o. female who presents to the Emergency Department complaining of constant, moderate left sided chest pain that started 30 minutes ago and has since subsided. Pt reports that her chest pain was relieved with one sublingual nitroglycerin tablet. Pt reports associated SOB and diaphroesis with onset of symptoms that has since subsided as well. Pt denies any dizziness or nausea. Pt reports that she has a h/o heart problems and had a stent placed 2 weeks ago. Pt has a h/o bypass surgery  Past Medical History  Diagnosis Date  . Other chronic pulmonary heart diseases   . HTN (hypertension)   . Coronary atherosclerosis of unspecified type of vessel, native or graft     a. remote CABG in 1999 with Dr. Laneta Simmers. b. s/p PCI with stent to LCX in 2009 (Platinum protocol study). c. NSTEMI 10/2011 s/p BMS to SVG->diagonal.  . Personal history of  other diseases of digestive system   . Gout, unspecified   . Unspecified hereditary and idiopathic peripheral neuropathy   . Esophageal reflux   . Anemia   . GI bleed   . Primary pancreatic carcinoid tumor   . 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 Leukocytosis 03/16/2011  . Hyperlipidemia     Statin intolerant  . CLL (chronic lymphocytic leukemia)     seeing Dr. [name]  . Pancreas disorder     prior pancreatectomy in 2011 complicated by leak requiring prolong and repeated drainage and subsequent pancreatic stent  . Type I (juvenile type) diabetes mellitus without mention of complication, not stated as uncontrolled     Previously on insulin pump  . Atrial fibrillation     Past Surgical History  Procedure Date  . Cabg     Prior CABG in 1999 with LIMA to LAD, SVG to the DX  . Appendectomy   . Oophorectomy     w hysterectomy and bladder tacking procedure  . Tonsillectomy   . Belpharoptosis repair     right upper eyelid  . Esophagogastroduodenoscopy 07/11/2010  . Colonoscopy 10/20/06  . Coronary stent placement 2009    LCX  . Abdominal hysterectomy   . Coronary artery bypass graft     Family History  Problem Relation Age of Onset  . Stroke Mother   . Lung cancer Father   . Healthy Son     History  Substance Use Topics  . Smoking status: Never Smoker   .  Smokeless tobacco: Never Used  . Alcohol Use: No    OB History    Grav Para Term Preterm Abortions TAB SAB Ect Mult Living                  Review of Systems  Constitutional: Positive for diaphoresis. Negative for fatigue.  HENT: Negative for congestion, sinus pressure and ear discharge.   Eyes: Negative for discharge.  Respiratory: Positive for shortness of breath. Negative for cough.   Cardiovascular: Positive for chest pain.  Gastrointestinal: Negative for abdominal pain and diarrhea.  Genitourinary: Negative for frequency and hematuria.  Musculoskeletal: Negative for back pain.   Skin: Negative for rash.  Neurological: Negative for dizziness, seizures and headaches.  Hematological: Negative.   Psychiatric/Behavioral: Negative for hallucinations.  All other systems reviewed and are negative.    Allergies  Ivp dye; Codeine; Morphine; and Statins  Home Medications   Current Outpatient Rx  Name Route Sig Dispense Refill  . AMLODIPINE BESYLATE 10 MG PO TABS Oral Take 0.5 tablets (5 mg total) by mouth daily.    . ASPIRIN 81 MG PO TBEC Oral Take 1 tablet (81 mg total) by mouth daily.    Marland Kitchen VITAMIN D3 50000 UNITS PO CAPS Oral Take 50,000 Units by mouth every 7 (seven) days. TAKES ON THURSDAYS    . CLOPIDOGREL BISULFATE 75 MG PO TABS Oral Take 1 tablet (75 mg total) by mouth daily with breakfast. 30 tablet 1    Talk to your doctor before refilling this medicine ...  . COLCHICINE 0.6 MG PO TABS Oral Take 1 tablet (0.6 mg total) by mouth daily. Please call your primary doctor to ask if you should still continue to take this. Pharmacy left a note stating that your primary doctor may have planned to stop this at some point, so please clarify this with Dr. [name].    . COLESEVELAM HCL 625 MG PO TABS Oral Take 1 tablet (625 mg total) by mouth 3 (three) times daily. 90 tablet 6  . EZETIMIBE 10 MG PO TABS Oral Take 1 tablet (10 mg total) by mouth daily. 30 tablet 6  . FEBUXOSTAT 40 MG PO TABS Oral Take 40 mg by mouth daily.    Marland Kitchen GABAPENTIN 300 MG PO CAPS Oral Take 300 mg by mouth 3 (three) times daily.     . INSULIN ASPART 100 UNIT/ML [place] SOLN  FlexPen - Inject into skin subcutaneously. 10 units with breakfast, 10 units with lunch, and 12 units with supper. 1 pen 1    Follow up with your primary doctor for this medici ...  . INSULIN GLARGINE 100 UNIT/ML [place] SOLN  Solostar Pen - Inject into skin subcutaneously. 44 units daily in AM and 26 units at bedtime. 1 pen 1    Follow up with your primary doctor for this medici ...  . LOPERAMIDE HCL 2 MG PO CAPS Oral Take 1 capsule (2 mg total)  by mouth as needed for diarrhea or loose stools (as directed by package insert, up to 2 doses).    Marland Kitchen MAGNESIUM 250 MG PO TABS Oral Take 500 mg by mouth daily.     Marland Kitchen METOPROLOL TARTRATE 25 MG PO TABS Oral Take 1 tablet (25 mg total) by mouth 3 (three) times daily.    Marland Kitchen NITROGLYCERIN 0.4 MG SL SUBL Sublingual Place 1 tablet (0.4 mg total) under the tongue every 5 (five) minutes x 3 doses as needed for chest pain. 25 tablet 4  . OLMESARTAN MEDOXOMIL 40  MG PO TABS Oral Take 0.5 tablets (20 mg total) by mouth daily.    Marland Kitchen FISH OIL 1000 MG PO CAPS Oral Take by mouth daily.      Marland Kitchen POLYETHYLENE GLYCOL 3350 PO PACK Oral Take 17 g by mouth daily as needed (constipation).    Marland Kitchen PRENATAL VITAMINS 0.8 MG PO TABS Oral Take 1 tablet by mouth daily.     Marland Kitchen PROMETHAZINE HCL 12.5 MG PO TABS Oral Take 1 tablet (12.5 mg total) by mouth as needed for nausea.    Marland Kitchen TRAMADOL HCL 50 MG PO TABS Oral Take 50 mg by mouth daily as needed. pain      BP 121/64  Resp 19  Ht 5\' 4"  (1.626 m)  Wt 163 lb (73.936 kg)  BMI 27.98 kg/m2  Physical Exam  Constitutional: She is oriented to person, place, and time. She appears well-developed.  HENT:  Head: Normocephalic and atraumatic.  Eyes: Conjunctivae and EOM are normal. No scleral icterus.  Neck: Neck supple. No thyromegaly present.  Cardiovascular: Normal rate, regular rhythm and normal heart sounds.  Exam reveals no gallop and no friction rub.   No murmur heard.      Well healed incision to chest.   Pulmonary/Chest: Effort normal and breath sounds normal. No stridor. She has no wheezes. She has no rales. She exhibits no tenderness.  Abdominal: Soft. Bowel sounds are normal. She exhibits no distension. There is no tenderness. There is no rebound.  Musculoskeletal: Normal range of motion. She exhibits no edema.  Lymphadenopathy:    She has no cervical adenopathy.  Neurological: She is oriented to person, place, and time. Coordination normal.  Skin: No rash noted. No  erythema.  Psychiatric: She has a normal mood and affect. Her behavior is normal.    ED Course  Procedures (including critical care time)   COORDINATION OF CARE:  12:15-Discussed planned course of treatment with the patient including blood work and chest x-ray, who is agreeable at this time.   12:30-Medication Orders: 0.9% sodium chloride infusion-once  Results for orders placed during the hospital encounter of 11/24/11  CBC WITH DIFFERENTIAL      Component Value Range   WBC 17.6 (*) 4.0 - 10.5 K/uL   RBC 4.23  3.87 - 5.11 MIL/uL   Hemoglobin 13.4  12.0 - 15.0 g/dL   HCT 96.0  45.4 - 09.8 %   MCV 96.7  78.0 - 100.0 fL   MCH 31.7  26.0 - 34.0 pg   MCHC 32.8  30.0 - 36.0 g/dL   RDW 11.9 (*) 14.7 - 82.9 %   Platelets 384  150 - 400 K/uL   Neutrophils Relative 65  43 - 77 %   Neutro Abs 11.5 (*) 1.7 - 7.7 K/uL   Lymphocytes Relative 24  12 - 46 %   Lymphs Abs 4.3 (*) 0.7 - 4.0 K/uL   Monocytes Relative 7  3 - 12 %   Monocytes Absolute 1.3 (*) 0.1 - 1.0 K/uL   Eosinophils Relative 3  0 - 5 %   Eosinophils Absolute 0.5  0.0 - 0.7 K/uL   Basophils Relative 0  0 - 1 %   Basophils Absolute 0.1  0.0 - 0.1 K/uL  BASIC METABOLIC PANEL      Component Value Range   Sodium 137  135 - 145 mEq/L   Potassium 4.1  3.5 - 5.1 mEq/L   Chloride 99  96 - 112 mEq/L   CO2 27  19 -  32 mEq/L   Glucose, Bld 162 (*) 70 - 99 mg/dL   BUN 15  6 - 23 mg/dL   Creatinine, Ser 4.78  0.50 - 1.10 mg/dL   Calcium 29.5  8.4 - 62.1 mg/dL   GFR calc non Af Amer 52 (*) >90 mL/min   GFR calc Af Amer 60 (*) >90 mL/min  TROPONIN I      Component Value Range   Troponin I 3.96 (*) <0.30 ng/mL     Dg Chest Portable 1 View  11/24/2011  *RADIOLOGY REPORT*  Clinical Data: Chest pain.  PORTABLE CHEST - 1 VIEW  Comparison: Chest x-ray 11/04/2011.  Findings: Lung volumes are normal.  No consolidative airspace disease.  No definite pleural effusions.  Mild pulmonary venous congestion without frank pulmonary edema.   Mild cardiomegaly is unchanged. The patient is rotated to the right on today's exam, resulting in distortion of the mediastinal contours and reduced diagnostic sensitivity and specificity for mediastinal pathology. Atherosclerosis in the thoracic aorta.  Status post median sternotomy for CABG with a LIMA.  IMPRESSION: 1.  Mild cardiomegaly with mild pulmonary venous congestion, but no frank pulmonary edema. 2.  Atherosclerosis. 3.  Status post median sternotomy for CABG with a LIMA.  Original Report Authenticated By: Florencia Reasons, M.D.     No diagnosis found.   CRITICAL CARE Performed by: Ena Demary L   Total critical care time:40  Critical care time was exclusive of separately billable procedures and treating other patients.  Critical care was necessary to treat or prevent imminent or life-threatening deterioration.  Critical care was time spent personally by me on the following activities: development of treatment plan with patient and/or surrogate as well as nursing, discussions with consultants, evaluation of patient's response to treatment, examination of patient, obtaining history from patient or surrogate, ordering and performing treatments and interventions, ordering and review of laboratory studies, ordering and review of radiographic studies, pulse oximetry and re-evaluation of patient's condition.  Date: 11/24/2011  Rate: 87  Rhythm: atrial fibrillation  QRS Axis: normal  Intervals: normal  ST/T Wave abnormalities: nonspecific ST changes  Conduction Disutrbances:none  Narrative Interpretation:   Old EKG Reviewed: unchanged   MDM    The chart was scribed for me under my direct supervision.  I personally performed the history, physical, and medical decision making and all procedures in the evaluation of this patient.Amanda Lennert, MD 11/24/11 1333  Amanda Lennert, MD 11/24/11 [phone]

## 2011-11-24 NOTE — H&P (Signed)
History and Physical   Patient ID: LYNZI MEULEMANS MRN: [id number], DOB/AGE: [birth year]   Admit date: 11/24/2011 Date of Consult: 11/24/2011   Primary Physician: Dwana Melena, MD Primary Cardiologist: Dr. Bonnee Quin  HPI:   Ms. Cincotta is a 78yo female with PMHx significant for CAD (CABG 1999; stent to LCx 2009; NSTEMI 10/2011 s/p BMS to SVG-diagonal), permanent atrial fibrillation, primary pancreatic malignant neuroendocrine tumor (s/p prior pancreatectomy in 2011 c/b leak requiring multiple drainage attempts and stent placement), CLL, DM, HL, HTN and GERD who was transferred from [facility] hospital to [facility] today with NSTEMI. She had recently been discharged on 11/14/11 for NSTEMI s/p PCI (BMS to SVG-diagonal). This was somewhat complicated by post-procedure delirium/confusion believed to be attributed to sedation vs pre-medication prednisone (patient with allergy to IVP dye). She was combative and attempted to bite, A&O x 1 to person. She was placed in 4-pt restraints. This resolved within 24 hours with IV Precedex for sedation. Mentation improved the following day. She was continued on DAPT- ASA/Plavix x 1 month. The plan is to hold Coumadin while on DAPT, and resume in about 4 weeks once Plavix is discontinued.  Studies that admission:  2D echo 2011/12/03:   LVEF 55-60%, mild LVH, indeterminant diastolic function, biatrial enlargement and normal RV size and function.   Cardiac cath + PCI 11/09/11:  20-30% ostial left main stenosis; occluded ostial LAD supplied by IMA graft, 20-30% scattered LCx stenoses with widely patent stent and 20% narrowing; diffuse RCA luminal irregularities up to 20-30%; SVG-diagonal patent, but proximal 90% stenosis s/p BMS; patent LIMA-LAD; LVEF 55-65%.   She reports experiencing constant, substernal dull chest pain radiating to her neck with associated diaphoresis today around 11 AM at rest. This was reminiscent of her prior NSTEMI, but less intense. No  pattern with exertion or meals. No prior episodes of chest pain since discharge leading up to today. She has noted increased nausea since discharge and decreased PO intake. Denies associated shortness of breath, lightheadedness, palpitations, LE edema, orthopnea, PND or new cough. No vomiting, diarrhea, fevers or chills. No active bleeding. She reports medication compliance. The pain persisted and she called EMS. She took NTG SL x 1 with slow, but eventual relief. A subsequent tablet was administered in transit to [facility] ED with complete alleviation.   Upon ED arrival, EKG revealed atrial fibrillation with controlled VR, no ST changes. Initial trop-I elevated at 3.96. New TWIs apparent inferolaterally. BMET returned WNL. CBC revealed a leukocytosis of 17.6. H/H and PLT stable. VSS. CXR reveals mild cardiomegaly with mild pulmonary venous congestion without frank edema and atherosclerosis. The patient ruled in for NSTEMI, was heparinized and transferred to [facility] for further evaluation/management.   Problem List: Past Medical History  Diagnosis Date  . Other chronic pulmonary heart diseases   . HTN (hypertension)   . Coronary atherosclerosis of unspecified type of vessel, native or graft     a. remote CABG in 1999 with Dr. Laneta Simmers. b. s/p PCI with stent to LCX in 2009 (Platinum protocol study). c. NSTEMI 10/2011 s/p BMS to SVG->diagonal.  . Personal history of other diseases of digestive system   . Gout, unspecified   . Unspecified hereditary and idiopathic peripheral neuropathy   . Esophageal reflux   . Anemia   . GI bleed   . Primary pancreatic carcinoid tumor   . 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 Leukocytosis 03/16/2011  . Hyperlipidemia     Statin  intolerant  . CLL (chronic lymphocytic leukemia)     seeing Dr. [name]  . Pancreas disorder     prior pancreatectomy in 2011 complicated by leak requiring prolong and repeated drainage and subsequent  pancreatic stent  . Type I (juvenile type) diabetes mellitus without mention of complication, not stated as uncontrolled     Previously on insulin pump  . Atrial fibrillation     Past Surgical History  Procedure Date  . Cabg     Prior CABG in 1999 with LIMA to LAD, SVG to the DX  . Appendectomy   . Oophorectomy     w hysterectomy and bladder tacking procedure  . Tonsillectomy   . Belpharoptosis repair     right upper eyelid  . Esophagogastroduodenoscopy 07/11/2010  . Colonoscopy 10/20/06  . Coronary stent placement 2009    LCX  . Abdominal hysterectomy   . Coronary artery bypass graft      Allergies:  Allergies  Allergen Reactions  . Ivp Dye (Iodinated Diagnostic Agents) Other (See Comments)    Burst veins in her arm.   . Codeine Nausea And Vomiting  . Morphine Other (See Comments)    Not in Right State of Mind.   . Statins Other (See Comments)    Leg Cramps.     Home Medications: Prior to Admission medications   Medication Sig Start Date End Date Taking? Authorizing Provider  amLODipine (NORVASC) 10 MG tablet Take 0.5 tablets (5 mg total) by mouth daily. 11/14/11  Yes Dayna N Dunn, PA  aspirin (ASPIRIN EC) 81 MG EC tablet Take 1 tablet (81 mg total) by mouth daily. 11/14/11 11/13/12 Yes Dayna N Dunn, PA  Cholecalciferol (VITAMIN D3) 50000 UNITS CAPS Take 50,000 Units by mouth every 7 (seven) days. TAKES ON THURSDAYS   Yes Historical Provider, MD  clopidogrel (PLAVIX) 75 MG tablet Take 1 tablet (75 mg total) by mouth daily with breakfast. 11/14/11 11/13/12 Yes Dayna N Dunn, PA  colchicine (COLCRYS) 0.6 MG tablet Take 1 tablet (0.6 mg total) by mouth daily. Please call your primary doctor to ask if you should still continue to take this. Pharmacy left a note stating that your primary doctor may have planned to stop this at some point, so please clarify this with Dr. [name]. 11/14/11  Yes Dayna N Dunn, PA  colesevelam (WELCHOL) 625 MG tablet Take 625 mg by mouth 3 (three) times daily.  11/14/11 11/13/12 Yes Dayna N Dunn, PA  ezetimibe (ZETIA) 10 MG tablet Take 1 tablet (10 mg total) by mouth daily. 11/14/11 11/13/12 Yes Dayna N Dunn, PA  febuxostat (ULORIC) 40 MG tablet Take 40 mg by mouth daily.   Yes Historical Provider, MD  gabapentin (NEURONTIN) 300 MG capsule Take 300 mg by mouth 3 (three) times daily.    Yes Historical Provider, MD  insulin aspart (NOVOLOG) 100 UNIT/ML injection Inject 10-12 Units into the skin 3 (three) times daily as needed. FlexPen - Inject into skin subcutaneously. 10 units with breakfast, 10 units with lunch, and 12 units with supper. 11/14/11  Yes Dayna N Dunn, PA  insulin glargine (LANTUS) 100 UNIT/ML injection Solostar Pen - Inject into skin subcutaneously. 44 units daily in AM and 26 units at bedtime. 11/14/11  Yes Dayna N Dunn, PA  Magnesium 500 MG TABS Take 1 tablet by mouth 2 (two) times daily.   Yes Historical Provider, MD  metoprolol tartrate (LOPRESSOR) 25 MG tablet Take 1 tablet (25 mg total) by mouth 3 (three) times daily. 11/14/11 11/13/12  Yes Dayna [place], PA  nitroGLYCERIN (NITROSTAT) 0.4 MG SL tablet Place 1 tablet (0.4 mg total) under the tongue every 5 (five) minutes x 3 doses as needed for chest pain. 11/14/11 11/13/12 Yes Dayna N Dunn, PA  olmesartan (BENICAR) 40 MG tablet Take 40 mg by mouth daily. 11/14/11  Yes Dayna N Dunn, PA  polyethylene glycol (MIRALAX / GLYCOLAX) packet Take 17 g by mouth daily as needed (constipation). 11/14/11  Yes Dayna N Dunn, PA  Prenatal Multivit-Min-Fe-FA (PRENATAL VITAMINS) 0.8 MG tablet Take 1 tablet by mouth daily.    Yes Historical Provider, MD  promethazine (PHENERGAN) 12.5 MG tablet Take 1 tablet (12.5 mg total) by mouth as needed for nausea. 11/14/11  Yes Dayna N Dunn, PA  promethazine (PHENERGAN) 25 MG suppository Place 25 mg rectally every 6 (six) hours as needed. Nausea and Vomiting   Yes Historical Provider, MD  traMADol (ULTRAM) 50 MG tablet Take 50 mg by mouth daily as needed. pain 10/28/11  Yes Historical Provider, MD     Inpatient Medications:     . sodium chloride   Intravenous Once  . heparin  4,000 Units Intravenous Once    (Not in a hospital admission)  Family History  Problem Relation Age of Onset  . Stroke Mother   . Lung cancer Father   . Healthy Son      History   Social History  . Marital Status: Widowed    Spouse Name: N/A    Number of Children: N/A  . Years of Education: N/A   Occupational History  . Not on file.   Social History Main Topics  . Smoking status: Never Smoker   . Smokeless tobacco: Never Used  . Alcohol Use: No  . Drug Use: No  . Sexually Active: No   Other Topics Concern  . Not on file   Social History Narrative   Worked at [employer] for 41 years. Has a son who lives in [place].      Review of Systems: General: negative for chills, fever, night sweats or weight changes.  Cardiovascular: positive for chest pain and diaphoresis, dyspnea on exertion, edema, orthopnea, palpitations, paroxysmal nocturnal dyspnea or shortness of breath Dermatological: negative for rash Respiratory: negative for cough or wheezing Urologic: negative for hematuria Abdominal:  positive for nausea, negative for vomiting, diarrhea, bright red blood per rectum, melena, or hematemesis Neurologic:  negative for visual changes, syncope, or dizziness All other systems reviewed and are otherwise negative except as noted above.  Physical Exam: Blood pressure 115/53, pulse 83, temperature 97.7 F (36.5 C), temperature source Oral, resp. rate 23, height 5\' 4"  (1.626 m), weight 73.936 kg (163 lb), SpO2 95.00%.    General: Elderly, well developed, well nourished, in no acute distress. Head: Normocephalic, atraumatic, sclera non-icteric, no xanthomas, nares are without discharge.  Neck: Negative for carotid bruits. JVD not elevated. Lungs: Clear bilaterally to auscultation without wheezes, rales, or rhonchi. Breathing is unlabored. Heart: Irregularly irregular, normal rate, with S1 S2.  No murmurs, rubs, or gallops appreciated. Abdomen: Soft, non-tender, non-distended with normoactive bowel sounds. No hepatomegaly. No rebound/guarding. No obvious abdominal masses. Msk:  Strength and tone appears normal for age. Extremities: No clubbing, cyanosis or edema.  Distal pedal pulses are 2+ and equal bilaterally. Neuro: Alert and oriented X 3. Moves all extremities spontaneously. Psych:  Responds to questions appropriately with a normal affect.  Labs: Recent Labs  Basename 11/24/11 1215   WBC 17.6*   HGB 13.4   HCT  40.9   MCV 96.7   PLT 384   Lab 11/24/11 1215  NA 137  K 4.1  CL 99  CO2 27  BUN 15  CREATININE 1.00  CALCIUM 10.1  PROT --  BILITOT --  ALKPHOS --  ALT --  AST --  AMYLASE --  LIPASE --  GLUCOSE 162*   Recent Labs  Basename 11/24/11 1215   CKTOTAL --   CKMB --   CKMBINDEX --   TROPONINI 3.96*   Radiology/Studies: Dg Chest Portable 1 View  11/24/2011  *RADIOLOGY REPORT*  Clinical Data: Chest pain.  PORTABLE CHEST - 1 VIEW  Comparison: Chest x-ray 11/04/2011.  Findings: Lung volumes are normal.  No consolidative airspace disease.  No definite pleural effusions.  Mild pulmonary venous congestion without frank pulmonary edema.  Mild cardiomegaly is unchanged. The patient is rotated to the right on today's exam, resulting in distortion of the mediastinal contours and reduced diagnostic sensitivity and specificity for mediastinal pathology. Atherosclerosis in the thoracic aorta.  Status post median sternotomy for CABG with a LIMA.  IMPRESSION: 1.  Mild cardiomegaly with mild pulmonary venous congestion, but no frank pulmonary edema. 2.  Atherosclerosis. 3.  Status post median sternotomy for CABG with a LIMA.  Original Report Authenticated By: Florencia Reasons, M.D.   Dg Chest Portable 1 View  11/04/2011  *RADIOLOGY REPORT*  Clinical Data: Chest pain.  Pancreatic neoplasm.  PORTABLE CHEST - 1 VIEW  Comparison: 11/15/2011most recent.  Multiple priors.   Findings: Cardiomegaly.  Prior CABG.  No infiltrates or failure. No pulmonary nodules are seen.  Bones are demineralized.  Similar appearance to priors.  IMPRESSION: Cardiomegaly, no active disease.  Original Report Authenticated By: Elsie Stain, M.D.   EKG: atrial fibrillation, 87 bpm, TWIs II, III, aVF, V5, V6, Q waves vs poor R wave progression V1-V3  ASSESSMENT:   1. NSTEMI/CAD 2. Permanent atrial fibrillation 3. Type 2 DM 4. HTN 5. HL 6. GERD 7. Gout 8. Nausea  DISCUSSION/PLAN:  Patient presented with only episode of chest pain since discharge. Reminiscent of prior MI, but less intense. It occurred at 11AM today while at rest. Located substernally with radiation to her neck and associated with diaphoresis. This was eventually relieved with NTG SL. Initial trop-I at [facility] returned elevated at 3.96. EKG with new TW changes. She was heparinized and transferred to [facility] where she remains comfortable without pain. Reviewing cath data, there does not seem to be borderline lesions that may be flow limiting. The concern lies in possible ISR. Will admit to telemetry. Should cardiac catheterization be pursued, approach to pre-medication and sedation will have to be adjusted due to episode of delirium/AMS driven by this last admission. Continue heparin. Continue outpatient medications including DAPT- ASA/Plavix - PPI, insulin and welchol/zetia (statin intolerant). Cycle cardiac biomarkers. Atrial fibrillation is rate-controlled at present. On speaking with Dr. Antoine Poche, the patient wishes to be conservative in her management given delirium on her last admission. Plan is to keep a high threshold for cardiac catheterization. Will trend cardiac biomarkers. If markedly elevate or chest pain becomes intractable, would proceed on to cath. Would avoid sedating meds. Will make NPO tomorrow AM should enzymes elevate further or chest pain worsen overnight.   Signed, R. Hurman Horn,  PA-C 11/24/2011, 3:52 PM   History and all data above reviewed.  Patient examined.  I agree with the findings as above.  She had one episode of chest pain this am.  For the past  two weeks she has had no recurrent discomfort and she has been slowly recovering from her recent cath/PCI.  She was just starting to feel well.  However, her EKG today is slightly different then the previous.  Her troponin is up (no CKMB) The patient exam reveals COR:RRR, no rub  ,  Lungs: Clear, no crackles  ,  Abd: Positive bowel sounds, no rebound no guarding, Ext No edema, multiple bruising  .  All available labs, radiology testing, previous records reviewed. Agree with documented assessment and plan. Chest pain.  She is quite tearful about being in the hospital again.  She would very much like conservative therapy unless there is a clear suggestion of a new acute coronary syndrome.  Plan to cycle enzymes and repeat an EKG.    Fayrene Fearing Annesha Delgreco  5:57 PM  11/24/2011

## 2011-11-24 NOTE — ED Notes (Signed)
Pt c/o left side cp radiating into left neck x 1 hour ago-relieved by 1 sl nitro pta. Denies pain at this time.

## 2011-11-24 NOTE — Progress Notes (Signed)
Heparin per pharmacy Indication: chest pain/ACS  Heparin level = 0.58 on 1000 units/hr Goal heparin level = 0.3-0.7  Heparin is within the desired range. Continue same rate. F/U daily heparin level and CBC  Cardell Peach, PharmD

## 2011-11-25 DIAGNOSIS — I214 Non-ST elevation (NSTEMI) myocardial infarction: Secondary | ICD-10-CM

## 2011-11-25 LAB — BASIC METABOLIC PANEL
CO2: 26 mEq/L (ref 19–32)
Calcium: 9.7 mg/dL (ref 8.4–10.5)
Creatinine, Ser: 0.7 mg/dL (ref 0.50–1.10)

## 2011-11-25 LAB — CARDIAC PANEL(CRET KIN+CKTOT+MB+TROPI)
Relative Index: INVALID (ref 0.0–2.5)
Relative Index: INVALID (ref 0.0–2.5)
Total CK: 55 U/L (ref 7–177)
Total CK: 52 U/L (ref 7–177)
Troponin I: 3.6 ng/mL (ref <0.30)

## 2011-11-25 LAB — CBC
MCH: 31.2 pg (ref 26.0–34.0)
MCHC: 32.2 g/dL (ref 30.0–36.0)
MCV: 97.1 fL (ref 78.0–100.0)
Platelets: 398 10*3/uL (ref 150–400)
RBC: 4.07 MIL/uL (ref 3.87–5.11)
RDW: 15.8 % — ABNORMAL HIGH (ref 11.5–15.5)

## 2011-11-25 LAB — GLUCOSE, CAPILLARY: Glucose-Capillary: 156 mg/dL — ABNORMAL HIGH (ref 70–99)

## 2011-11-25 NOTE — Evaluation (Signed)
Physical Therapy Evaluation Patient Details Name: [name] MRN: [id number] DOB: [birth year] Today's Date: 11/25/2011 Time: 1010-1039 PT Time Calculation (min): 29 min  PT Assessment / Plan / Recommendation Clinical Impression  78 y.o. female admitted to [facility] for chest pain.  Presents today with generalized weakness and decreased balance.  PTA she was active with [facility] HHPT.  I recommend she resume home therapy at discharge.      PT Assessment  Patient needs continued PT services    Follow Up Recommendations  Home health PT    Barriers to Discharge        Equipment Recommendations  None recommended by PT    Recommendations for Other Services     Frequency Min 3X/week    Precautions / Restrictions Precautions Precautions: Fall Precaution Comments: she tends to lose her balance while walking and talking/turning her head   Pertinent Vitals/Pain No reports of pain      Mobility  Transfers Transfers: Sit to Stand;Stand to Sit Sit to Stand: 4: Min guard;From bed;With upper extremity assist Stand to Sit: 4: Min guard;To bed;With upper extremity assist Details for Transfer Assistance: min guard assist secondary to tendancy to lose balance backwards when trying to stand.  Also, uncontrolled descent to sit Ambulation/Gait Ambulation/Gait Assistance: 4: Min guard Ambulation Distance (Feet): 250 Feet Assistive device: Rolling walker Ambulation/Gait Assistance Details: min guard assist secondary to LOB while walking talking and turning head  Gait Pattern: Step-through pattern;Shuffle (staggering) Gait velocity: 1.0 ft/sec (<1.8 ft/sec indicates she is at risk for recurrent falls)        PT Diagnosis: Difficulty walking;Abnormality of gait;Generalized weakness  PT Problem List: Decreased strength;Decreased activity tolerance;Decreased balance PT Treatment Interventions: DME instruction;Gait training;Functional mobility training;Therapeutic activities;Therapeutic exercise;Balance  training;Neuromuscular re-education;Patient/family education   PT Goals Acute Rehab PT Goals PT Goal Formulation: With patient Time For Goal Achievement: 12/09/11 Potential to Achieve Goals: Good Pt will go Supine/Side to Sit: with modified independence;with HOB 0 degrees PT Goal: Supine/Side to Sit - Progress: Goal set today Pt will go Sit to Supine/Side: with modified independence;with HOB 0 degrees PT Goal: Sit to Supine/Side - Progress: Goal set today Pt will go Sit to Stand: with modified independence PT Goal: Sit to Stand - Progress: Goal set today Pt will go Stand to Sit: with modified independence PT Goal: Stand to Sit - Progress: Goal set today Pt will Ambulate: >150 feet;with modified independence;with rolling walker PT Goal: Ambulate - Progress: Goal set today  Visit Information  Last PT Received On: 11/25/11 Assistance Needed: +1    Subjective Data  Subjective: Pt reports that she has not had any falls or stumbles at home.  She uses her RW inside "on occation" Patient Stated Goal: to go home today   Prior Functioning  Home Living Lives With: Alone Available Help at Discharge: Personal care attendant (8 am -10 pm 5 days per week) Type of Home: Apartment Home Access: Level entry Home Layout: One level Home Adaptive Equipment: Environmental consultant - four wheeled Communication Communication: No difficulties    Cognition  Overall Cognitive Status: Appears within functional limits for tasks assessed/performed    Extremity/Trunk Assessment Right Lower Extremity Assessment RLE ROM/Strength/Tone: Deficits RLE ROM/Strength/Tone Deficits: grossly 3/5 per functional assessment Left Lower Extremity Assessment LLE ROM/Strength/Tone: Deficits LLE ROM/Strength/Tone Deficits: grossly 3/5 per functional assessment      End of Session PT - End of Session Activity Tolerance: Patient limited by fatigue Patient left: in bed;with call bell/phone within reach Nurse Communication:  (d/c  recs)    GP     Rollene Rotunda. Mahaley Schwering, PT, DPT [phone]   11/25/2011, 11:25 AM

## 2011-11-25 NOTE — Discharge Summary (Signed)
CARDIOLOGY DISCHARGE SUMMARY   Patient ID: [name] MRN: [id number] DOB/AGE: 1929-11-07 78 y.o.  Admit date: 11/24/2011 Discharge date: 11/25/2011  Primary Discharge Diagnosis:  Anginal chest pain, medical therapy recommended Secondary Discharge Diagnosis:  Past Medical History  Diagnosis Date  . HTN (hypertension)   . Coronary atherosclerosis of unspecified type of vessel, native or graft     a. remote CABG in 1999 with Dr. Laneta Simmers. b. s/p PCI with stent to LCX in 2009 (Platinum protocol study). c. NSTEMI 10/2011 s/p BMS to SVG->diagonal.  . Personal history of other diseases of digestive system   . Gout, unspecified     "have had it in my hands and my feet"  . Unspecified hereditary and idiopathic peripheral neuropathy   . Esophageal reflux   . Anemia   . GI bleed   . Leukocytosis 03/16/2011  . Hyperlipidemia     Statin intolerant  . Pancreas disorder     prior pancreatectomy in 2011 complicated by leak requiring prolong and repeated drainage and subsequent pancreatic stent  . Other chronic pulmonary heart diseases   . Type I (juvenile type) diabetes mellitus without mention of complication, not stated as uncontrolled     Previously on insulin pump  . Permanent atrial fibrillation   . Anginal pain 11/24/11    "1st time"  . Exertional dyspnea 11/24/11  . Depression   . 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 CLL (chronic lymphocytic leukemia)     seeing Dr. [name]  . Myocardial infarction 1999    "on the verge of one"  . NSTEMI (non-ST elevated myocardial infarction) 10/2011    /H&P    Hospital Course: [name] is an 78 year old female with a history of coronary artery disease. She was discharged from the hospital on 11/14/2011 after a non-STEMI treated with PTCA and stenting. She has done fairly well but on the day of admission, she had an episode of substernal chest pain. This was treated with 2 sublingual nitroglycerin per the patient. Her  symptoms were concerning for angina and she came to the hospital where she was admitted for further evaluation and treatment.  Her CK-MB's were negative for MI. Her troponins had some elevation but there was no crescendo/decrescendo pattern. This is felt residual from her previous MI. Her white count was elevated but she had received steroids. She had no signs or symptoms of infection and no fever. She is to follow up with her family physician if any signs of infection develop. She had some mild congestion on her chest x-ray but no shortness of breath and her O2 saturation remained good with ambulation.  On 11/25/2011, she was seen by Dr. Kirke Corin. She was ambulating with physical therapy and did well. She had been active with Home health PT prior to admission and should continue this at discharge. Her daughter-in-law called because her son and daughter-in-law are concerned about her ability to care for herself. She currently has a caregiver, but not 24-hour supervision. She did well enough with physical therapy that 24-hour supervision is not felt necessary. She does need help and her family understands this. She is encouraged to continue with a caregiver and with home health PT. Her daughter-in-law was also encouraged to contact the patient's family physician if she has any other concerns.  [name] was ambulating without chest pain or shortness of breath and considered stable for discharge, to follow up as an outpatient.  Labs:   Lab Results  Component Value  Date   WBC 17.7* 11/25/2011   HGB 12.7 11/25/2011   HCT 39.5 11/25/2011   MCV 97.1 11/25/2011   PLT 398 11/25/2011    Lab 11/25/11 0539  NA 140  K 3.9  CL 102  CO2 26  BUN 13  CREATININE 0.70  CALCIUM 9.7  PROT --  BILITOT --  ALKPHOS --  ALT --  AST --  GLUCOSE 159*    Basename 11/25/11 0539 11/24/11 2315 11/24/11 1808  CKTOTAL 52 55 55  CKMB 2.0 2.4 2.5  CKMBINDEX -- -- --  TROPONINI 3.60* 3.61* 3.79*    Radiology: Dg Chest  Portable 1 View 11/24/2011  *RADIOLOGY REPORT*  Clinical Data: Chest pain.  PORTABLE CHEST - 1 VIEW  Comparison: Chest x-ray 11/04/2011.  Findings: Lung volumes are normal.  No consolidative airspace disease.  No definite pleural effusions.  Mild pulmonary venous congestion without frank pulmonary edema.  Mild cardiomegaly is unchanged. The patient is rotated to the right on today's exam, resulting in distortion of the mediastinal contours and reduced diagnostic sensitivity and specificity for mediastinal pathology. Atherosclerosis in the thoracic aorta.  Status post median sternotomy for CABG with a LIMA.  IMPRESSION: 1.  Mild cardiomegaly with mild pulmonary venous congestion, but no frank pulmonary edema. 2.  Atherosclerosis. 3.  Status post median sternotomy for CABG with a LIMA.  Original Report Authenticated By: Florencia Reasons, M.D.   EKG: 25-Nov-2011 06:33:46  Atrial fibrillation Nonspecific ST and T wave abnormality Abnormal ECG 20mm/s 12mm/mV 100Hz  8.0.1 12SL 241 CID: 1 Referred by: HOCHREIN Unconfirmed Vent. rate 78 BPM PR interval * ms QRS duration 84 ms QT/QTc 374/426 ms P-R-T axes * 77 -25  FOLLOW UP PLANS AND APPOINTMENTS Allergies  Allergen Reactions  . Codeine Nausea And Vomiting  . Ivp Dye (Iodinated Diagnostic Agents) Other (See Comments)    Burst veins in her arm.   . Morphine Other (See Comments)    Not in Right State of Mind.   . Prednisone Other (See Comments)    Patient became delirious/combative when given 60mg  dose  . Statins Other (See Comments)    Leg Cramps.    Medication List  As of 11/25/2011 11:49 AM   TAKE these medications         amLODipine 10 MG tablet   Commonly known as: NORVASC   Take 0.5 tablets (5 mg total) by mouth daily.      aspirin 81 MG EC tablet   Take 1 tablet (81 mg total) by mouth daily.      clopidogrel 75 MG tablet   Commonly known as: PLAVIX   Take 1 tablet (75 mg total) by mouth daily with breakfast.      colchicine 0.6  MG tablet   Take 1 tablet (0.6 mg total) by mouth daily. Please call your primary doctor to ask if you should still continue to take this. Pharmacy left a note stating that your primary doctor may have planned to stop this at some point, so please clarify this with Dr. [name].      colesevelam 625 MG tablet   Commonly known as: WELCHOL   Take 625 mg by mouth 3 (three) times daily.      ezetimibe 10 MG tablet   Commonly known as: ZETIA   Take 1 tablet (10 mg total) by mouth daily.      febuxostat 40 MG tablet   Commonly known as: ULORIC   Take 40 mg by mouth daily.  insulin aspart 100 UNIT/ML injection   Commonly known as: novoLOG   Inject 10-12 Units into the skin 3 (three) times daily as needed. FlexPen - Inject into skin subcutaneously. 10 units with breakfast, 10 units with lunch, and 12 units with supper.      insulin glargine 100 UNIT/ML injection   Commonly known as: LANTUS   Solostar Pen - Inject into skin subcutaneously. 44 units daily in AM and 26 units at bedtime.      Magnesium 500 MG Tabs   Take 1 tablet by mouth 2 (two) times daily.      metoprolol tartrate 25 MG tablet   Commonly known as: LOPRESSOR   Take 1 tablet (25 mg total) by mouth 3 (three) times daily.      NEURONTIN 300 MG capsule   Generic drug: gabapentin   Take 300 mg by mouth 3 (three) times daily.      nitroGLYCERIN 0.4 MG SL tablet   Commonly known as: NITROSTAT   Place 1 tablet (0.4 mg total) under the tongue every 5 (five) minutes x 3 doses as needed for chest pain.      olmesartan 40 MG tablet   Commonly known as: BENICAR   Take 40 mg by mouth daily.      polyethylene glycol packet   Commonly known as: MIRALAX / GLYCOLAX   Take 17 g by mouth daily as needed (constipation).      PRENATAL VITAMINS 0.8 MG tablet   Take 1 tablet by mouth daily.      promethazine 12.5 MG tablet   Commonly known as: PHENERGAN   Take 1 tablet (12.5 mg total) by mouth as needed for nausea.       promethazine 25 MG suppository   Commonly known as: PHENERGAN   Place 25 mg rectally every 6 (six) hours as needed. Nausea and Vomiting      traMADol 50 MG tablet   Commonly known as: ULTRAM   Take 50 mg by mouth daily as needed. pain      Vitamin D3 50000 UNITS Caps   Take 50,000 Units by mouth every 7 (seven) days. TAKES ON THURSDAYS           Discharge Orders    Future Appointments: Provider: Department: Dept Phone: Center:   12/15/2011 11:00 AM Malissa Hippo, MD [facility] [phone] None   12/17/2011 2:15 PM Herby Abraham, MD [facility] [phone] [facility]   02/29/2012 11:45 AM Rachael Fee [facility] [phone] None   02/29/2012 12:15 PM Josph Macho, MD [facility] [phone] None       BRING ALL MEDICATIONS WITH YOU TO FOLLOW UP APPOINTMENTS  Time spent with patient to include physician time: 49 min Signed: Theodore Demark 11/25/2011, 11:33 AM Co-Sign MD

## 2011-11-25 NOTE — Progress Notes (Signed)
    I have seen and examined the patient. I agree with the above note with the addition of : the patient is chest pain free. Had only 1 episode of chest pain that responded to 1 sl NTG. TnI is elevated but CPK is normal which suggest elevation from prior NSTEMI. The patient clearly does not want repeat cardiac cath which is reasonable considering previous complications. Continue medical therapy. Stop Heparin. Ambulate. PT/OT and have safety evaluation to determine discharge planning.   [name] MD, [facility] 11/25/2011 10:01 AM

## 2011-11-25 NOTE — Progress Notes (Signed)
Inpatient Diabetes Program Recommendations  AACE/ADA: New Consensus Statement on Inpatient Glycemic Control (2013)  Target Ranges:  Prepandial:   less than 140 mg/dL      Peak postprandial:   less than 180 mg/dL (1-2 hours)      Critically ill patients:  140 - 180 mg/dL   Reason for Visit: Admitted with CP.  Takes insulin at home.  Sees Dr. Lucianne Muss for Endocrinology.  Inpatient Diabetes Program Recommendations Correction (SSI): Please add Novolog Moderate correction scale (SSi) tid ac + HS.  Note: Will follow. Ambrose Finland RN, MSN, CDE Diabetes Coordinator Inpatient Diabetes Program [phone]

## 2011-11-25 NOTE — Progress Notes (Signed)
Utilization review completed.  

## 2011-11-25 NOTE — Progress Notes (Signed)
Patient Name: [name] Date of Encounter: 11/25/2011  Principal Problem:  *NSTEMI (non-ST elevated myocardial infarction) Active Problems:  IDDM  HYPERCHOLESTEROLEMIA  IIA  Gout, unspecified  HYPERTENSION  CAD (coronary artery disease)  Permanent atrial fibrillation  Hypertension  Hyperlipidemia  GERD (gastroesophageal reflux disease)    SUBJECTIVE: Pt had no more chest pain since pain yest am, relieved by SL NTG x 2. Says she does not want another cath because she is anxious about hospital costs and problems with cath last time.  OBJECTIVE Filed Vitals:   11/24/11 1626 11/24/11 2100 11/25/11 0500 11/25/11 0800  BP: 158/76 152/74 125/73 143/85  Pulse: 80 77 84 80  Temp: 98.6 F (37 C) 99 F (37.2 C) 98.9 F (37.2 C) 98 F (36.7 C)  TempSrc: Oral Oral Oral Oral  Resp: 23 22 22 17   Height:      Weight:      SpO2: 93% 93% 95% 94%   No intake or output data in the 24 hours ending 11/25/11 4098 Filed Weights   11/24/11 1152  Weight: 163 lb (73.936 kg)     PHYSICAL EXAM General: Well developed, well nourished, elderly female in no acute distress. Head: Normocephalic, atraumatic.  Neck: Supple without bruits, JVD not elevated. Lungs:  Resp regular and unlabored, CTA bilaterally. Heart: Irregular, S1, S2, no S3, S4, or murmur. Abdomen: Soft, non-tender, non-distended, BS + x 4.  Extremities: No clubbing, cyanosis, no edema.  Neuro: Alert and oriented X 3. Moves all extremities spontaneously. Psych: Normal affect - but anxious about hospital costs and problems with cath last time.  LABS: CBC: Basename 11/25/11 0539 11/24/11 1215  WBC 17.7* 17.6*  NEUTROABS -- 11.5*  HGB 12.7 13.4  HCT 39.5 40.9  MCV 97.1 96.7  PLT 398 384   INR:No results found for this basename: INR in the last 72 hours Basic Metabolic Panel: Basename 11/25/11 0539 11/24/11 1215  NA 140 137  K 3.9 4.1  CL 102 99  CO2 26 27  GLUCOSE 159* 162*  BUN 13 15  CREATININE 0.70 1.00    CALCIUM 9.7 10.1  MG -- --  PHOS -- --   Cardiac Enzymes: Basename 11/25/11 0539 11/24/11 2315 11/24/11 1808  CKTOTAL 52 55 55  CKMB 2.0 2.4 2.5  CKMBINDEX -- -- --  TROPONINI 3.60* 3.61* 3.79*    TELE/ECG: 25-Nov-2011 06:33:46   Atrial fibrillation Nonspecific ST and T wave abnormality Vent. rate 78 BPM PR interval * ms QRS duration 84 ms QT/QTc 374/426 ms P-R-T axes * 77 -25   Radiology/Studies: Dg Chest Portable 1 View 11/24/2011  *RADIOLOGY REPORT*  Clinical Data: Chest pain.  PORTABLE CHEST - 1 VIEW  Comparison: Chest x-ray 11/04/2011.  Findings: Lung volumes are normal.  No consolidative airspace disease.  No definite pleural effusions.  Mild pulmonary venous congestion without frank pulmonary edema.  Mild cardiomegaly is unchanged. The patient is rotated to the right on today's exam, resulting in distortion of the mediastinal contours and reduced diagnostic sensitivity and specificity for mediastinal pathology. Atherosclerosis in the thoracic aorta.  Status post median sternotomy for CABG with a LIMA.  IMPRESSION: 1.  Mild cardiomegaly with mild pulmonary venous congestion, but no frank pulmonary edema. 2.  Atherosclerosis. 3.  Status post median sternotomy for CABG with a LIMA.  Original Report Authenticated By: Florencia Reasons, M.D.   Current Medications:     . sodium chloride   Intravenous Once  . amLODipine  5 mg Oral Daily  .  aspirin EC  81 mg Oral Daily  . clopidogrel  75 mg Oral Q breakfast  . colchicine  0.6 mg Oral Daily  . colesevelam  625 mg Oral TID  . ezetimibe  10 mg Oral Daily  . febuxostat  40 mg Oral Daily  . gabapentin  300 mg Oral TID  . heparin  4,000 Units Intravenous Once  . insulin glargine  26 Units Subcutaneous q morning - 10a  . insulin glargine  44 Units Subcutaneous QHS  . irbesartan  300 mg Oral Daily  . magnesium gluconate  500 mg Oral BID  . metoprolol tartrate  25 mg Oral TID  . sodium chloride  3 mL Intravenous Q12H  . DISCONTD:  insulin glargine  26-44 Units Subcutaneous BID  . DISCONTD: Magnesium  1 tablet Oral BID      . heparin 1,000 Units/hr (11/25/11 0834)    ASSESSMENT AND PLAN: Principal Problem:  *NSTEMI (non-ST elevated myocardial infarction) - CKMBs are negative and troponins are flat. No clear indication of new blockage. OK to ambulate patient and see how tolerated. Keep NPO till MD sees if cath needed but significant problems last time, had steroids for dye allergy but ?psychosis after cath required Precedex.  Leukocytosis - patient had steroids recently, no fever and no pneumonia on chest x-ray. Will check urinalysis but feel that no antibiotic indicated unless active infection seen.  Otherwise, continue her medications for: Active Problems:  IDDM  HYPERCHOLESTEROLEMIA  IIA  Gout, unspecified  HYPERTENSION  CAD (coronary artery disease)  Permanent atrial fibrillation  Hypertension  Hyperlipidemia  GERD (gastroesophageal reflux disease)   Signed, Theodore Demark , PA-C 9:22 AM 11/25/2011

## 2011-11-25 NOTE — Evaluation (Signed)
Occupational Therapy Evaluation Patient Details Name: [name] MRN: [id number] DOB: 1930/03/03 Today's Date: 11/25/2011 Time: 1308-6578 OT Time Calculation (min): 14 min  OT Assessment / Plan / Recommendation Clinical Impression  This 78 y.o. female admitted with CP.  Pt. appears to be close to or at her baseline from an OT standpoint.  She demonstrates balance deficits and was receiving HHPT PTA.  She will continue to have her aid assist her at discharge and has all DME. Pt. for discharge home today.  No further OT services recomended    OT Assessment  Patient does not need any further OT services    Follow Up Recommendations  No OT follow up;Supervision - Intermittent    Barriers to Discharge      Equipment Recommendations  None recommended by OT    Recommendations for Other Services    Frequency       Precautions / Restrictions Precautions Precautions: Fall Precaution Comments: she tends to lose her balance while walking and talking/turning her head       ADL  Eating/Feeding: Simulated;Independent Where Assessed - Eating/Feeding: Edge of bed Grooming: Performed;Wash/dry hands;Wash/dry face;Supervision/safety Where Assessed - Grooming: Supported standing Upper Body Bathing: Simulated;Set up Where Assessed - Upper Body Bathing: Unsupported sitting Lower Body Bathing: Simulated;Supervision/safety Where Assessed - Lower Body Bathing: Supported sit to stand Upper Body Dressing: Simulated;Set up Where Assessed - Upper Body Dressing: Unsupported sitting Lower Body Dressing: Simulated;Performed;Supervision/safety Where Assessed - Lower Body Dressing: Supported sit to stand Toilet Transfer: Buyer, retail Method: Sit to Barista: Raised toilet seat with arms (or 3-in-1 over toilet) Toileting - Clothing Manipulation and Hygiene: Simulated;Supervision/safety Where Assessed - Engineer, mining and Hygiene:  Standing Tub/Shower Transfer: Landscape architect Method: Science writer: Shower seat with back Equipment Used: Rolling walker Transfers/Ambulation Related to ADLs: supervision in room ADL Comments: Pt. reports she feels she is at baseline.  She was able to perform simulated tub transfer with supervision with UE support.  Pt. report her aid will continue to provide assist at discharge    OT Diagnosis:    OT Problem List:   OT Treatment Interventions:     OT Goals    Visit Information  Last OT Received On: 11/25/11 Assistance Needed: +1    Subjective Data  Subjective: "I haven't been up here long enough to get weak this time" Patient Stated Goal: To go home   Prior Functioning  Vision/Perception  Home Living Lives With: Alone Available Help at Discharge: Personal care attendant (8 am -10 pm 5 days per week) Type of Home: Apartment Home Access: Level entry Home Layout: One level Bathroom Shower/Tub: Tub/shower unit;Curtain Firefighter: Standard Bathroom Accessibility: Yes How Accessible: Accessible via walker Home Adaptive Equipment: Dan Humphreys - four wheeled;Shower chair without back;Bedside commode/3-in-1;Grab bars in shower Prior Function Level of Independence: Needs assistance Needs Assistance: Bathing;Meal Prep;Light Housekeeping Bath: Supervision/set-up Meal Prep: Moderate Light Housekeeping: Moderate Driving: Yes Vocation: Retired Musician: No difficulties Dominant Hand: Right      Cognition  Overall Cognitive Status: Appears within functional limits for tasks assessed/performed Arousal/Alertness: Awake/alert Orientation Level: Appears intact for tasks assessed Behavior During Session: [facility] for tasks performed    Extremity/Trunk Assessment Right Upper Extremity Assessment RUE ROM/Strength/Tone: Within functional levels RUE Coordination: WFL - gross/fine motor Left Upper Extremity  Assessment LUE ROM/Strength/Tone: Within functional levels LUE Coordination: WFL - gross/fine motor Right Lower Extremity Assessment RLE ROM/Strength/Tone: Deficits RLE ROM/Strength/Tone Deficits: grossly 3/5 per functional assessment Left  Lower Extremity Assessment LLE ROM/Strength/Tone: Deficits LLE ROM/Strength/Tone Deficits: grossly 3/5 per functional assessment Trunk Assessment Trunk Assessment: Normal   Mobility Transfers Transfers: Sit to Stand;Stand to Sit Sit to Stand: 5: Supervision;With upper extremity assist;From bed Stand to Sit: 5: Supervision;To bed;With upper extremity assist Details for Transfer Assistance: Pt. with no LOB during OT eval/assesment during simulated LB ADLs and tub tranfser.  Pt. did use bil. UEs to provide support   Exercise    Balance    End of Session OT - End of Session Activity Tolerance: Patient tolerated treatment well Patient left: in bed;with call bell/phone within reach  GO     [name] M 11/25/2011, 12:03 PM

## 2011-11-25 NOTE — Care Management Note (Signed)
    Page 1 of 1   11/25/2011     12:16:33 PM   CARE MANAGEMENT NOTE 11/25/2011  Patient:  [name], [name]   Account Number:  [id number]  Date Initiated:  11/25/2011  Documentation initiated by:  GRAVES-BIGELOW,Mohamed Portlock  Subjective/Objective Assessment:   Pt admitted with cp. Plan to treat medically. Pt was previously active with [facility] for RN and PT services.     Action/Plan:   CM made resumption referral. No further needs at this time.   Anticipated DC Date:  11/25/2011   Anticipated DC Plan:  HOME W HOME HEALTH SERVICES      DC Planning Services  CM consult      [facility] Choice  Resumption Of Svcs/PTA Provider   Choice offered to / List presented to:  C-1 Patient        HH arranged  HH-1 RN  HH-10 DISEASE MANAGEMENT  HH-2 PT      HH agency  [facility]   Status of service:  Completed, signed off Medicare Important Message given?   (If response is "NO", the following Medicare IM given date fields will be blank) Date Medicare IM given:   Date Additional Medicare IM given:    Discharge Disposition:  HOME W HOME HEALTH SERVICES  Per UR Regulation:    If discussed at Long Length of Stay Meetings, dates discussed:    Comments:

## 2011-12-01 ENCOUNTER — Ambulatory Visit: Payer: Medicare Other | Admitting: Physician Assistant

## 2011-12-04 ENCOUNTER — Telehealth (INDEPENDENT_AMBULATORY_CARE_PROVIDER_SITE_OTHER): Payer: Self-pay | Admitting: *Deleted

## 2011-12-04 NOTE — Telephone Encounter (Signed)
[name] called and states that she had just seen [name]. Patient had yeast on her tongue Patient is Nauseated Her abd. Is swollen Unable to eat Her gout is back due to the patient not taking her medication as she should. Dr.Hall is out of the office and has no one on call for him. [name] is asking if we could call in something for Yeast , and is the patient can be seen prior to her appointment 12/28/11. Per [name] may call in Nystatin Oral Suspension 500,000 units Take 5 mls by mouth four times a day. Dispense for 10 days. This was called to [facility] [name] was called and made aware. Also she was told that our office would review office appointment and call her back.

## 2011-12-07 NOTE — Telephone Encounter (Signed)
Per Yerlin, "I am feeling better. Camelia Eng is not a doctor and she will wait until she can see him."

## 2011-12-15 ENCOUNTER — Ambulatory Visit (INDEPENDENT_AMBULATORY_CARE_PROVIDER_SITE_OTHER): Payer: Medicare Other | Admitting: Internal Medicine

## 2011-12-17 ENCOUNTER — Ambulatory Visit (INDEPENDENT_AMBULATORY_CARE_PROVIDER_SITE_OTHER): Payer: Medicare Other | Admitting: Cardiology

## 2011-12-17 ENCOUNTER — Encounter: Payer: Self-pay | Admitting: Cardiology

## 2011-12-17 VITALS — BP 126/66 | HR 95 | Ht 64.0 in | Wt 164.8 lb

## 2011-12-17 DIAGNOSIS — I2510 Atherosclerotic heart disease of native coronary artery without angina pectoris: Secondary | ICD-10-CM

## 2011-12-17 DIAGNOSIS — I4821 Permanent atrial fibrillation: Secondary | ICD-10-CM

## 2011-12-17 DIAGNOSIS — D72829 Elevated white blood cell count, unspecified: Secondary | ICD-10-CM

## 2011-12-17 DIAGNOSIS — I4891 Unspecified atrial fibrillation: Secondary | ICD-10-CM

## 2011-12-17 NOTE — Progress Notes (Signed)
HPI:  The patient is in for fo my plan llowup. She is really upset about her hospitalization. In addition, she seems upset about a lot of things. Apparently, her caregiver is leaving her.  She is adamant about the fact that she wants me and only me take care of her which is in the hospital in the future. I tried to make it clear to her that she really got excellent care, in that they did a good job with her stent.  He saw her primary care doctor who prescribed some sertraline, but she's not sure whether it was safe and wanted my opinion. My plan had been to get her off of her dual antiplatelet therapy now that she is out more than a month, and get her back on antithrombin therapy. However, I am really concerned about her ability take this and safely take it at this point in time.  Current Outpatient Prescriptions  Medication Sig Dispense Refill  . allopurinol (ZYLOPRIM) 300 MG tablet Take 300 mg by mouth daily.      Marland Kitchen amLODipine (NORVASC) 10 MG tablet Take 0.5 tablets (5 mg total) by mouth daily.      Marland Kitchen aspirin (ASPIRIN EC) 81 MG EC tablet Take 1 tablet (81 mg total) by mouth daily.      . BD PEN NEEDLE NANO U/F 32G X 4 MM MISC       . Cholecalciferol (VITAMIN D3) 50000 UNITS CAPS Take 50,000 Units by mouth every 7 (seven) days. TAKES ON THURSDAYS      . ciprofloxacin (CIPRO) 250 MG tablet Take 250 mg by mouth 2 (two) times daily.       . clopidogrel (PLAVIX) 75 MG tablet Take 1 tablet (75 mg total) by mouth daily with breakfast.  30 tablet  1  . colchicine (COLCRYS) 0.6 MG tablet Take 1 tablet (0.6 mg total) by mouth daily. Please call your primary doctor to ask if you should still continue to take this. Pharmacy left a note stating that your primary doctor may have planned to stop this at some point, so please clarify this with Dr. [name].      . colesevelam (WELCHOL) 625 MG tablet Take 625 mg by mouth 3 (three) times daily.      Marland Kitchen ezetimibe (ZETIA) 10 MG tablet Take 1 tablet (10 mg total) by mouth  daily.  30 tablet  6  . febuxostat (ULORIC) 40 MG tablet Take 40 mg by mouth daily.      Marland Kitchen gabapentin (NEURONTIN) 300 MG capsule Take 300 mg by mouth 4 (four) times daily.       . insulin aspart (NOVOLOG) 100 UNIT/ML injection Inject 10-12 Units into the skin 3 (three) times daily as needed. FlexPen - Inject into skin subcutaneously. 10 units with breakfast, 10 units with lunch, and 12 units with supper.      . insulin glargine (LANTUS) 100 UNIT/ML injection Solostar Pen - Inject into skin subcutaneously. 44 units daily in AM and 26 units at bedtime.  1 pen  1  . Magnesium 500 MG TABS Take 1 tablet by mouth 2 (two) times daily.      . metoprolol tartrate (LOPRESSOR) 25 MG tablet Take 25 mg by mouth 2 (two) times daily.      . nitroGLYCERIN (NITROSTAT) 0.4 MG SL tablet Place 1 tablet (0.4 mg total) under the tongue every 5 (five) minutes x 3 doses as needed for chest pain.  25 tablet  4  . olmesartan (BENICAR)  40 MG tablet Take 40 mg by mouth daily.      . pantoprazole (PROTONIX) 20 MG tablet Take 40 mg by mouth daily.      . polyethylene glycol (MIRALAX / GLYCOLAX) packet Take 17 g by mouth daily as needed (constipation).      . Prenatal Multivit-Min-Fe-FA (PRENATAL VITAMINS) 0.8 MG tablet Take 1 tablet by mouth daily.       . promethazine (PHENERGAN) 12.5 MG tablet Take 1 tablet (12.5 mg total) by mouth as needed for nausea.      . sertraline (ZOLOFT) 25 MG tablet Take 25 mg by mouth daily.       . traMADol (ULTRAM) 50 MG tablet Take 50 mg by mouth daily as needed. pain      . DISCONTD: metoprolol tartrate (LOPRESSOR) 25 MG tablet Take 1 tablet (25 mg total) by mouth 3 (three) times daily.      Marland Kitchen DISCONTD: promethazine (PHENERGAN) 25 MG suppository Place 25 mg rectally every 6 (six) hours as needed. Nausea and Vomiting        Allergies  Allergen Reactions  . Codeine Nausea And Vomiting  . Ivp Dye (Iodinated Diagnostic Agents) Other (See Comments)    Burst veins in her arm.   . Morphine Other  (See Comments)    Not in Right State of Mind.   . Prednisone Other (See Comments)    Patient became delirious/combative when given 60mg  dose  . Statins Other (See Comments)    Leg Cramps.     Past Medical History  Diagnosis Date  . HTN (hypertension)   . Coronary atherosclerosis of unspecified type of vessel, native or graft     a. remote CABG in 1999 with Dr. Laneta Simmers. b. s/p PCI with stent to LCX in 2009 (Platinum protocol study). c. NSTEMI 10/2011 s/p BMS to SVG->diagonal.  . Personal history of other diseases of digestive system   . Gout, unspecified     "have had it in my hands and my feet"  . Unspecified hereditary and idiopathic peripheral neuropathy   . Esophageal reflux   . Anemia   . GI bleed   . Leukocytosis 03/16/2011  . Hyperlipidemia     Statin intolerant  . Pancreas disorder     prior pancreatectomy in 2011 complicated by leak requiring prolong and repeated drainage and subsequent pancreatic stent  . Other chronic pulmonary heart diseases   . Type I (juvenile type) diabetes mellitus without mention of complication, not stated as uncontrolled     Previously on insulin pump  . Permanent atrial fibrillation   . Anginal pain 11/24/11    "1st time"  . Exertional dyspnea 11/24/11  . Depression   . 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 CLL (chronic lymphocytic leukemia)     seeing Dr. [name]  . Myocardial infarction 1999    "on the verge of one"  . NSTEMI (non-ST elevated myocardial infarction) 10/2011    /H&P    Past Surgical History  Procedure Date  . Oophorectomy 1970's    w hysterectomy and bladder tacking procedure  . Belpharoptosis repair     right upper eyelid  . Esophagogastroduodenoscopy 07/11/2010  . Colonoscopy 10/20/06  . Tonsillectomy 1947  . Appendectomy 1970's?  . Abdominal hysterectomy 1970's  . Coronary angioplasty with stent placement 10/2011    "makes total of 4"; 20-30% ostial left main stenosis; occluded ostial LAD  supplied by IMA graft, 20-30% scattered LCx stenoses with widely patent stent and 20%  narrowing; diffuse RCA luminal irregularities up to 20-30%; SVG-diagonal patent, but proximal 90% stenosis s/p BMS; patent LIMA-LAD; LVEF 55-65%.  . Coronary angioplasty with stent placement 2009    LCX  . Cataract extraction w/ intraocular lens  implant, bilateral ~ 2003  . Coronary artery bypass graft 1999    LIMA to LAD, SVG to the DX  . Pancreatectomy 2011    c/b leak requiring multiple drainage attempts and stent placement), /H&P    Family History  Problem Relation Age of Onset  . Stroke Mother   . Lung cancer Father   . Healthy Son     History   Social History  . Marital Status: Widowed    Spouse Name: N/A    Number of Children: N/A  . Years of Education: N/A   Occupational History  . Not on file.   Social History Main Topics  . Smoking status: Never Smoker   . Smokeless tobacco: Never Used  . Alcohol Use: No  . Drug Use: No  . Sexually Active: No   Other Topics Concern  . Not on file   Social History Narrative   Worked at [employer] for 41 years. Has a son who lives in [place].     ROS: Please see the HPI.  All other systems reviewed and negative.  PHYSICAL EXAM:  BP 126/66  Pulse 95  Ht 5\' 4"  (1.626 m)  Wt 164 lb 12.8 oz (74.753 kg)  BMI 28.29 kg/m2  SpO2 92%  General: Well developed, well nourished, in no acute distress. Head:  Normocephalic and atraumatic. Neck: no JVD Lungs: Clear to auscultation and percussion. Heart: Normal S1 and S2.  No murmur, rubs or gallops.  Abdomen:  Normal bowel sounds; soft; non tender; no organomegaly Pulses: Pulses normal in all 4 extremities. Extremities: No clubbing or cyanosis. No edema. Neurologic: Alert and oriented x 3.  EKG:  Atrial fib with controlled ventricular response.  Nonspecific T wave abnormality  ASSESSMENT AND PLAN:

## 2011-12-17 NOTE — Patient Instructions (Addendum)
Your physician recommends that you have lab work today: BMP and CBC  Your physician recommends that you schedule a follow-up appointment in: 2 WEEKS with Dr Riley Kill  Your physician recommends that you continue on your current medications as directed. Please refer to the Current Medication list given to you today.

## 2011-12-18 LAB — CBC WITH DIFFERENTIAL/PLATELET
Basophils Relative: 0.4 % (ref 0.0–3.0)
Eosinophils Relative: 2.3 % (ref 0.0–5.0)
HCT: 37.9 % (ref 36.0–46.0)
Hemoglobin: 12.2 g/dL (ref 12.0–15.0)
Lymphs Abs: 4.7 10*3/uL — ABNORMAL HIGH (ref 0.7–4.0)
Monocytes Relative: 6.9 % (ref 3.0–12.0)
Neutro Abs: 10.3 10*3/uL — ABNORMAL HIGH (ref 1.4–7.7)
Platelets: 326 10*3/uL (ref 150.0–400.0)
RBC: 3.82 Mil/uL — ABNORMAL LOW (ref 3.87–5.11)
WBC: 16.6 10*3/uL — ABNORMAL HIGH (ref 4.5–10.5)

## 2011-12-18 LAB — BASIC METABOLIC PANEL
GFR: 93.17 mL/min (ref >60.00)
Potassium: 4.5 mEq/L (ref 3.5–5.1)
Sodium: 142 mEq/L (ref 135–145)

## 2011-12-19 NOTE — Assessment & Plan Note (Signed)
[name] remains in atrial fib.  I am concerned about her overall risk of thromboembolic events.  She is on dual antiplatelet therapy, but falls in a high risk category.  However, she is highly upset about losing her caregiver, and she has not been able to replace him.  As such, I am also concerned about confusion with medications and instructions, and I see this as a potential problem.  Please see prior nurse notes about similar concerns regarding adopting an anticoagulation strategy.  We will try to get her off of plavix, but Dr. Karilyn Cota does think she is in an anticoagulation candidate.  If we can identify a reliable resource, perhaps Xarelto might be an option.

## 2011-12-19 NOTE — Assessment & Plan Note (Signed)
No recurrent angina since PCI.

## 2011-12-28 ENCOUNTER — Ambulatory Visit (INDEPENDENT_AMBULATORY_CARE_PROVIDER_SITE_OTHER): Payer: Medicare Other | Admitting: Internal Medicine

## 2011-12-28 ENCOUNTER — Encounter (INDEPENDENT_AMBULATORY_CARE_PROVIDER_SITE_OTHER): Payer: Self-pay | Admitting: Internal Medicine

## 2011-12-28 VITALS — BP 120/80 | HR 82 | Temp 98.3°F | Resp 20 | Ht 64.0 in | Wt 163.1 lb

## 2011-12-28 DIAGNOSIS — R109 Unspecified abdominal pain: Secondary | ICD-10-CM

## 2011-12-28 DIAGNOSIS — R110 Nausea: Secondary | ICD-10-CM

## 2011-12-28 MED ORDER — ONDANSETRON HCL 4 MG PO TABS: 4.0000 mg | ORAL_TABLET | Freq: Three times a day (TID) | ORAL | Status: DC | PRN

## 2011-12-28 NOTE — Patient Instructions (Signed)
Solid phase gastric emptying study to be scheduled. Zofran or ondansetron oh milligrams by mouth up to 3 times a day as needed for nausea.

## 2011-12-28 NOTE — Progress Notes (Signed)
Presenting complaint;  Nausea and abdominal pain.  Subjective:  Patient is 78 year old Caucasian female who presents with 2 complaints. She complains of nausea as well as upper and lower abdominal pain. She was last seen over 3 months ago and abdominopelvic CT primarily for abdominal pain and was unremarkable. There was no evidence of recurrent pancreatic fluid collection. She's had nausea for several weeks. She seem to get worse with food. She denies vomiting. Her heartburn is well controlled with pantoprazole. She continues to complain of weight discomfort at epigastrium and across lower abdomen. Sprain does not seem to get better with defecation or urination. She denies melena or rectal bleeding. She denies weight loss. She states she heard from Dr. Lucianne Muss that her magnesium level was now. She would like to get magnesium infusion at [facility]. Patient was treated for MI back in July 2013 and had bare-metal stent placed. She also has history of A. Fib. Dr. Riley Kill is thinking of starting her on warfarin but is held back because of history of GI bleed. Patient tells me that she developed confusion following cardiac catheter in July 2013 and is convinced that she was given morphine. I reviewed her records and reassured her that she did not receive any morphine.  Current Medications: Current Outpatient Prescriptions  Medication Sig Dispense Refill  . allopurinol (ZYLOPRIM) 300 MG tablet Take 300 mg by mouth daily.      Marland Kitchen amLODipine (NORVASC) 10 MG tablet Take 0.5 tablets (5 mg total) by mouth daily.      Marland Kitchen aspirin (ASPIRIN EC) 81 MG EC tablet Take 1 tablet (81 mg total) by mouth daily.      . BD PEN NEEDLE NANO U/F 32G X 4 MM MISC       . Cholecalciferol (VITAMIN D3) 50000 UNITS CAPS Take 50,000 Units by mouth every 7 (seven) days. TAKES ON THURSDAYS      . ciprofloxacin (CIPRO) 250 MG tablet Take 250 mg by mouth 2 (two) times daily.       . clopidogrel (PLAVIX) 75 MG tablet Take 1 tablet (75 mg total)  by mouth daily with breakfast.  30 tablet  1  . colchicine (COLCRYS) 0.6 MG tablet Take 1 tablet (0.6 mg total) by mouth daily. Please call your primary doctor to ask if you should still continue to take this. Pharmacy left a note stating that your primary doctor may have planned to stop this at some point, so please clarify this with Dr. [name].      . colesevelam (WELCHOL) 625 MG tablet Take 625 mg by mouth 3 (three) times daily.      Marland Kitchen ezetimibe (ZETIA) 10 MG tablet Take 1 tablet (10 mg total) by mouth daily.  30 tablet  6  . febuxostat (ULORIC) 40 MG tablet Take 40 mg by mouth daily.      Marland Kitchen gabapentin (NEURONTIN) 300 MG capsule Take 300 mg by mouth 3 (three) times daily.       . insulin aspart (NOVOLOG) 100 UNIT/ML injection Inject 10-12 Units into the skin 3 (three) times daily as needed. FlexPen - Inject into skin subcutaneously. 10 units with breakfast, 10 units with lunch, and 12 units with supper.      . insulin glargine (LANTUS) 100 UNIT/ML injection Solostar Pen - Inject into skin subcutaneously. 44 units daily in AM and 26 units at bedtime.  1 pen  1  . Magnesium 500 MG TABS Take 1 tablet by mouth 2 (two) times daily.      Marland Kitchen  metoprolol tartrate (LOPRESSOR) 25 MG tablet Take 25 mg by mouth 2 (two) times daily.      . nitroGLYCERIN (NITROSTAT) 0.4 MG SL tablet Place 1 tablet (0.4 mg total) under the tongue every 5 (five) minutes x 3 doses as needed for chest pain.  25 tablet  4  . olmesartan (BENICAR) 40 MG tablet Take 40 mg by mouth daily.      . pantoprazole (PROTONIX) 20 MG tablet Take 40 mg by mouth daily.      . polyethylene glycol (MIRALAX / GLYCOLAX) packet Take 17 g by mouth daily as needed (constipation).      . Prenatal Multivit-Min-Fe-FA (PRENATAL VITAMINS) 0.8 MG tablet Take 1 tablet by mouth daily.       . promethazine (PHENERGAN) 12.5 MG tablet Take 1 tablet (12.5 mg total) by mouth as needed for nausea.      . traMADol (ULTRAM) 50 MG tablet Take 50 mg by mouth daily as needed.  pain       Past medical history;  Diabetes mellitus of several years duration.  Hypertension.  Peripheral neuropathy.  Coronary artery disease status post CABG in 1999.  She suffered MI in March 2009 and extends into left circumflex artery.  She had non-STEMI in July 2013 and had bare-metal stent to SVG. She is admitted to [facility] last month for angina and medical therapy recommended. History of gout.  Hyperlipidemia.  History of colonic polyps. Last colonoscopy was in July 2008 [facility] revealing few diverticula sigmoid colon as well as descending and ascending colon.  She was evaluated for iron deficiency anemia and GI bleed in March 2012 and EGD was negative. It was felt she may have bled from a right-sided colonic diverticula.  History of hypomagnesemia.  Tonsillectomy.  Eye surgery to correct bilateral ptosis in 2003.  BSO with hysterectomy several years.  History of erosive esophagitis but presently free of symptoms.  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 Percutaneous drain was removed over a year ago. Abdominal wound took several months to heal completely.  Leukocytosis. Initially thought to be due to CLL a doctor and never feels this is of benign etiology based on negative flow cytometry.   Objective: Blood pressure 120/80, pulse 82, temperature 98.3 F (36.8 C), temperature source Oral, resp. rate 20, height 5\' 4"  (1.626 m), weight 163 lb 1.6 oz (73.982 kg). Patient is alert and in no acute distress. Conjunctiva is pink. Sclera is nonicteric Oropharyngeal mucosa is normal. No neck masses or thyromegaly noted. Cardiac exam with regular rhythm normal S1 and S2. No murmur or gallop noted Lungs are clear to auscultation. Abdomen is protuberant. Bowel sounds are normal. No bruit noted. She has mild tenderness  in midepigastric region and hypogastric region. Liver edge is 3-4 cm below RCM. Spleen is not palpable.  No LE edema or clubbing noted.   Assessment:  #1. Nausea. Suspect she may have diabetic gastroparesis. She was given metoclopramide by Dr. Reather Littler her endocrinologist and felt better after taking a few doses but had to stop the medication because of insomnia and nervousness. Since she had EGD in may 2012 it would be reasonable to proceed with solid phase GES. #2. Upper and lower abdominal pain. She had abdominopelvic CT on 09/22/2011 and no abnormality noted to account for her pain. She has chronic pain possibly due to  autonomic neuropathy. Her pain is not suggestive of abdominal angina.   Plan:  Zofran 4 mg by mouth 3 times a day when necessary. She may want to take single dose every morning and see how she does. Solid phase gastric emptying study.

## 2011-12-30 ENCOUNTER — Ambulatory Visit (INDEPENDENT_AMBULATORY_CARE_PROVIDER_SITE_OTHER): Payer: Medicare Other | Admitting: Cardiology

## 2011-12-30 ENCOUNTER — Encounter: Payer: Self-pay | Admitting: Cardiology

## 2011-12-30 VITALS — BP 142/76 | HR 92 | Resp 18 | Ht 63.0 in | Wt 164.8 lb

## 2011-12-30 DIAGNOSIS — I4891 Unspecified atrial fibrillation: Secondary | ICD-10-CM

## 2011-12-30 DIAGNOSIS — I214 Non-ST elevation (NSTEMI) myocardial infarction: Secondary | ICD-10-CM

## 2011-12-30 DIAGNOSIS — I2510 Atherosclerotic heart disease of native coronary artery without angina pectoris: Secondary | ICD-10-CM

## 2011-12-30 MED ORDER — WARFARIN SODIUM 2.5 MG PO TABS: 2.5000 mg | ORAL_TABLET | Freq: Every day | ORAL | Status: DC

## 2011-12-30 NOTE — Progress Notes (Addendum)
HPI:  The patient is in for followup. Generally, she has not had chest pain. She has had some nausea, hip magnesium is noted below. She denies any chest pain progressive shortness of breath. She also has have a fair amount of nausea, and is scheduled for a gastric emptying study.   Recent laboratory studies were stable, although her white cell count is chronically elevated. Patient and I had a long conversation about her options.  I do think we should stop her plavix, and she is at very high risk for stroke, so I would like to get her in the 2.0-2.5 range anyway.    Current Outpatient Prescriptions  Medication Sig Dispense Refill  . allopurinol (ZYLOPRIM) 300 MG tablet Take 300 mg by mouth daily.      Marland Kitchen amLODipine (NORVASC) 10 MG tablet Take 0.5 tablets (5 mg total) by mouth daily.      Marland Kitchen aspirin (ASPIRIN EC) 81 MG EC tablet Take 1 tablet (81 mg total) by mouth daily.      . BD PEN NEEDLE NANO U/F 32G X 4 MM MISC       . Cholecalciferol (VITAMIN D3) 50000 UNITS CAPS Take 50,000 Units by mouth every 7 (seven) days. TAKES ON THURSDAYS      . ciprofloxacin (CIPRO) 250 MG tablet Take 250 mg by mouth 2 (two) times daily.       . clopidogrel (PLAVIX) 75 MG tablet Take 1 tablet (75 mg total) by mouth daily with breakfast.  30 tablet  1  . colchicine (COLCRYS) 0.6 MG tablet Take 1 tablet (0.6 mg total) by mouth daily. Please call your primary doctor to ask if you should still continue to take this. Pharmacy left a note stating that your primary doctor may have planned to stop this at some point, so please clarify this with Dr. [name].      . colesevelam (WELCHOL) 625 MG tablet Take 625 mg by mouth 3 (three) times daily.      Marland Kitchen ezetimibe (ZETIA) 10 MG tablet Take 1 tablet (10 mg total) by mouth daily.  30 tablet  6  . febuxostat (ULORIC) 40 MG tablet Take 40 mg by mouth daily.      Marland Kitchen gabapentin (NEURONTIN) 300 MG capsule Take 300 mg by mouth 3 (three) times daily.       . insulin aspart (NOVOLOG) 100  UNIT/ML injection Inject 10-12 Units into the skin 3 (three) times daily as needed. FlexPen - Inject into skin subcutaneously. 10 units with breakfast, 10 units with lunch, and 12 units with supper.      . insulin glargine (LANTUS) 100 UNIT/ML injection Solostar Pen - Inject into skin subcutaneously. 44 units daily in AM and 26 units at bedtime.  1 pen  1  . Magnesium 500 MG TABS Take 1 tablet by mouth 2 (two) times daily.      . metoprolol tartrate (LOPRESSOR) 25 MG tablet Take 25 mg by mouth 2 (two) times daily.      . nitroGLYCERIN (NITROSTAT) 0.4 MG SL tablet Place 1 tablet (0.4 mg total) under the tongue every 5 (five) minutes x 3 doses as needed for chest pain.  25 tablet  4  . olmesartan (BENICAR) 40 MG tablet Take 40 mg by mouth daily.      . ondansetron (ZOFRAN) 4 MG tablet Take 1 tablet (4 mg total) by mouth every 8 (eight) hours as needed for nausea.  30 tablet  1  . pantoprazole (PROTONIX) 20 MG  tablet Take 40 mg by mouth daily.      . polyethylene glycol (MIRALAX / GLYCOLAX) packet Take 17 g by mouth daily as needed (constipation).      . Prenatal Multivit-Min-Fe-FA (PRENATAL VITAMINS) 0.8 MG tablet Take 1 tablet by mouth daily.       . promethazine (PHENERGAN) 12.5 MG tablet Take 1 tablet (12.5 mg total) by mouth as needed for nausea.      . traMADol (ULTRAM) 50 MG tablet Take 50 mg by mouth daily as needed. pain        Allergies  Allergen Reactions  . Codeine Nausea And Vomiting  . Ivp Dye (Iodinated Diagnostic Agents) Other (See Comments)    Burst veins in her arm.   . Morphine Other (See Comments)    Not in Right State of Mind.   . Prednisone Other (See Comments)    Patient became delirious/combative when given 60mg  dose  . Statins Other (See Comments)    Leg Cramps.     Past Medical History  Diagnosis Date  . HTN (hypertension)   . Coronary atherosclerosis of unspecified type of vessel, native or graft     a. remote CABG in 1999 with Dr. Laneta Simmers. b. s/p PCI with stent  to LCX in 2009 (Platinum protocol study). c. NSTEMI 10/2011 s/p BMS to SVG->diagonal.  . Personal history of other diseases of digestive system   . Gout, unspecified     "have had it in my hands and my feet"  . Unspecified hereditary and idiopathic peripheral neuropathy   . Esophageal reflux   . Anemia   . GI bleed   . Leukocytosis 03/16/2011  . Hyperlipidemia     Statin intolerant  . Pancreas disorder     prior pancreatectomy in 2011 complicated by leak requiring prolong and repeated drainage and subsequent pancreatic stent  . Other chronic pulmonary heart diseases   . Type I (juvenile type) diabetes mellitus without mention of complication, not stated as uncontrolled     Previously on insulin pump  . Permanent atrial fibrillation   . Anginal pain 11/24/11    "1st time"  . Exertional dyspnea 11/24/11  . Depression   . 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 CLL (chronic lymphocytic leukemia)     seeing Dr. [name]  . Myocardial infarction 1999    "on the verge of one"  . NSTEMI (non-ST elevated myocardial infarction) 10/2011    /H&P    Past Surgical History  Procedure Date  . Oophorectomy 1970's    w hysterectomy and bladder tacking procedure  . Belpharoptosis repair     right upper eyelid  . Esophagogastroduodenoscopy 07/11/2010  . Colonoscopy 10/20/06  . Tonsillectomy 1947  . Appendectomy 1970's?  . Abdominal hysterectomy 1970's  . Coronary angioplasty with stent placement 10/2011    "makes total of 4"; 20-30% ostial left main stenosis; occluded ostial LAD supplied by IMA graft, 20-30% scattered LCx stenoses with widely patent stent and 20% narrowing; diffuse RCA luminal irregularities up to 20-30%; SVG-diagonal patent, but proximal 90% stenosis s/p BMS; patent LIMA-LAD; LVEF 55-65%.  . Coronary angioplasty with stent placement 2009    LCX  . Cataract extraction w/ intraocular lens  implant, bilateral ~ 2003  . Coronary artery bypass graft 1999     LIMA to LAD, SVG to the DX  . Pancreatectomy 2011    c/b leak requiring multiple drainage attempts and stent placement), /H&P    Family History  Problem Relation Age  of Onset  . Stroke Mother   . Lung cancer Father   . Healthy Son     History   Social History  . Marital Status: Widowed    Spouse Name: N/A    Number of Children: N/A  . Years of Education: N/A   Occupational History  . Not on file.   Social History Main Topics  . Smoking status: Never Smoker   . Smokeless tobacco: Never Used  . Alcohol Use: No  . Drug Use: No  . Sexually Active: No   Other Topics Concern  . Not on file   Social History Narrative   Worked at [employer] for 41 years. Has a son who lives in [place].     ROS: Please see the HPI.  All other systems reviewed and negative.  PHYSICAL EXAM:  BP 142/76  Pulse 92  Resp 18  Ht 5\' 3"  (1.6 m)  Wt 164 lb 12.8 oz (74.753 kg)  BMI 29.19 kg/m2  SpO2 93%  General: Well developed, well nourished, in no acute distress. Head:  Normocephalic and atraumatic. Neck: no JVD Lungs: Clear to auscultation and percussion. Heart: Normal S1 and S2.  No murmur, rubs or gallops. Slight SEM.  No DM Abd:  Good bowel sounds; soft; non tender; no organomegaly Pulses: Pulses normal in all 4 extremities. Extremities: No clubbing or cyanosis. No edema. Neurologic: Alert and oriented x 3.  EKG:  Atrial fibrillation.  Low voltage QRS.  Nonspecific T flattening.    ASSESSMENT AND PLAN:

## 2011-12-30 NOTE — Patient Instructions (Addendum)
Your physician has recommended you make the following change in your medication: STOP Plavix, START Warfarin on FRIDAY 2.5mg  once every evening  Your physician recommends that you schedule a follow-up appointment on Monday (01/04/12) with Misty Stanley in the [place] office for [facility] at 11:40  Your physician recommends that you schedule a follow-up appointment in: 2 WEEKS with Dr Riley Kill

## 2011-12-31 ENCOUNTER — Other Ambulatory Visit (HOSPITAL_COMMUNITY): Payer: Medicare Other

## 2012-01-01 ENCOUNTER — Encounter (HOSPITAL_COMMUNITY): Payer: Self-pay

## 2012-01-01 ENCOUNTER — Encounter (HOSPITAL_COMMUNITY)
Admission: RE | Admit: 2012-01-01 | Discharge: 2012-01-01 | Disposition: A | Discharge status: Home / Self Care | Payer: Medicare Other | Source: Ambulatory Visit | Attending: Internal Medicine | Admitting: Internal Medicine

## 2012-01-01 DIAGNOSIS — R109 Unspecified abdominal pain: Secondary | ICD-10-CM | POA: Insufficient documentation

## 2012-01-01 DIAGNOSIS — R110 Nausea: Secondary | ICD-10-CM | POA: Insufficient documentation

## 2012-01-01 MED ORDER — TECHNETIUM TC 99M SULFUR COLLOID
2.0000 | Freq: Once | INTRAVENOUS | Status: AC | PRN
  Administered 2012-01-01: 2.1 via ORAL

## 2012-01-03 DIAGNOSIS — I4891 Unspecified atrial fibrillation: Secondary | ICD-10-CM | POA: Insufficient documentation

## 2012-01-03 NOTE — Assessment & Plan Note (Signed)
Currently stable after recent implant of non DES for SVG disease.  She has not had more chest pain at present.

## 2012-01-03 NOTE — Assessment & Plan Note (Signed)
I am concerned about the risk of bleeding, I also have great concern about her thromboembolic risks, particularly because of her very high profile risk. As a result, and given the timeframe, we will stop her clopidogrel and start her back on warfarin. She will need to be followed up closely in the Coumadin clinic in [place] and I will need to continue to monitor her very carefully. Am also concerned about her living situation. She has gotten some help, but I feel that she is fragile and I strongly encouraged her to follow her sons lead and consider assisted living facility here in [place]. She is, as you might expect, not too crazy about this idea. She wants to get help at home. We will continue to follow her every other week and monitor her closely.

## 2012-01-04 ENCOUNTER — Ambulatory Visit (INDEPENDENT_AMBULATORY_CARE_PROVIDER_SITE_OTHER): Payer: Medicare Other | Admitting: *Deleted

## 2012-01-04 DIAGNOSIS — I4891 Unspecified atrial fibrillation: Secondary | ICD-10-CM

## 2012-01-04 DIAGNOSIS — Z7901 Long term (current) use of anticoagulants: Secondary | ICD-10-CM

## 2012-01-04 LAB — POCT INR: INR: 1.1

## 2012-01-04 NOTE — Assessment & Plan Note (Signed)
This is chronic.  ?

## 2012-01-04 NOTE — Assessment & Plan Note (Signed)
Her rate is controlled.  However, her thromboembolic risk is high.  Will monitor closely and see her back with the next couple of weeks.  She will call if there are issues.

## 2012-01-05 ENCOUNTER — Encounter (HOSPITAL_COMMUNITY): Payer: Medicare Other | Attending: Internal Medicine

## 2012-01-05 MED ORDER — MAGNESIUM SULFATE 50 % IJ SOLN
5.0000 g | Freq: Once | INTRAVENOUS | Status: AC
  Administered 2012-01-05: 5 g via INTRAVENOUS
  Filled 2012-01-05: qty 10

## 2012-01-05 MED ORDER — MAGNESIUM SULFATE 50 % IJ SOLN: 5.0000 g | Freq: Once | INTRAVENOUS | Status: DC

## 2012-01-05 MED ORDER — MAGNESIUM SULFATE 50 % IJ SOLN: 5.0000 g | Freq: Once | INTRAMUSCULAR | Status: DC

## 2012-01-05 MED ORDER — SODIUM CHLORIDE 0.9 % IV SOLN
Freq: Once | INTRAVENOUS | Status: AC
  Administered 2012-01-05: 10:00:00 via INTRAVENOUS

## 2012-01-05 MED ORDER — SODIUM CHLORIDE 0.9 % IJ SOLN
10.0000 mL | Freq: Once | INTRAMUSCULAR | Status: AC
  Administered 2012-01-05: 10 mL via INTRAVENOUS

## 2012-01-05 NOTE — Progress Notes (Signed)
Tolerated infusion well. 

## 2012-01-11 ENCOUNTER — Ambulatory Visit (INDEPENDENT_AMBULATORY_CARE_PROVIDER_SITE_OTHER): Payer: Medicare Other | Admitting: *Deleted

## 2012-01-11 DIAGNOSIS — Z7901 Long term (current) use of anticoagulants: Secondary | ICD-10-CM

## 2012-01-11 DIAGNOSIS — I4891 Unspecified atrial fibrillation: Secondary | ICD-10-CM

## 2012-01-11 LAB — POCT INR: INR: 3.1

## 2012-01-18 ENCOUNTER — Encounter: Payer: Self-pay | Admitting: Cardiology

## 2012-01-18 ENCOUNTER — Ambulatory Visit (INDEPENDENT_AMBULATORY_CARE_PROVIDER_SITE_OTHER): Payer: Medicare Other | Admitting: *Deleted

## 2012-01-18 ENCOUNTER — Ambulatory Visit (INDEPENDENT_AMBULATORY_CARE_PROVIDER_SITE_OTHER): Payer: Medicare Other | Admitting: Cardiology

## 2012-01-18 VITALS — BP 128/62 | HR 84 | Ht 64.0 in | Wt 164.4 lb

## 2012-01-18 DIAGNOSIS — I4821 Permanent atrial fibrillation: Secondary | ICD-10-CM

## 2012-01-18 DIAGNOSIS — Z7901 Long term (current) use of anticoagulants: Secondary | ICD-10-CM

## 2012-01-18 DIAGNOSIS — I4891 Unspecified atrial fibrillation: Secondary | ICD-10-CM

## 2012-01-18 DIAGNOSIS — I2510 Atherosclerotic heart disease of native coronary artery without angina pectoris: Secondary | ICD-10-CM

## 2012-01-18 NOTE — Patient Instructions (Addendum)
Your physician recommends that you have lab work today: CBC  Your physician recommends that you schedule a follow-up appointment in: 2-3 WEEKS  Your physician recommends that you continue on your current medications as directed. Please refer to the Current Medication list given to you today.

## 2012-01-19 ENCOUNTER — Encounter (HOSPITAL_COMMUNITY): Payer: Self-pay | Admitting: Emergency Medicine

## 2012-01-19 ENCOUNTER — Inpatient Hospital Stay (HOSPITAL_COMMUNITY)
Admission: EM | Admit: 2012-01-19 | Discharge: 2012-01-21 | DRG: 378 | Disposition: A | Discharge status: Home / Self Care | Payer: Medicare Other | Attending: Internal Medicine | Admitting: Internal Medicine

## 2012-01-19 DIAGNOSIS — I2789 Other specified pulmonary heart diseases: Secondary | ICD-10-CM

## 2012-01-19 DIAGNOSIS — R1030 Lower abdominal pain, unspecified: Secondary | ICD-10-CM

## 2012-01-19 DIAGNOSIS — I10 Essential (primary) hypertension: Secondary | ICD-10-CM

## 2012-01-19 DIAGNOSIS — Z7901 Long term (current) use of anticoagulants: Secondary | ICD-10-CM

## 2012-01-19 DIAGNOSIS — Z9079 Acquired absence of other genital organ(s): Secondary | ICD-10-CM

## 2012-01-19 DIAGNOSIS — E109 Type 1 diabetes mellitus without complications: Secondary | ICD-10-CM

## 2012-01-19 DIAGNOSIS — K921 Melena: Secondary | ICD-10-CM

## 2012-01-19 DIAGNOSIS — I2510 Atherosclerotic heart disease of native coronary artery without angina pectoris: Secondary | ICD-10-CM

## 2012-01-19 DIAGNOSIS — E669 Obesity, unspecified: Secondary | ICD-10-CM | POA: Diagnosis present

## 2012-01-19 DIAGNOSIS — I214 Non-ST elevation (NSTEMI) myocardial infarction: Secondary | ICD-10-CM

## 2012-01-19 DIAGNOSIS — K625 Hemorrhage of anus and rectum: Secondary | ICD-10-CM

## 2012-01-19 DIAGNOSIS — K219 Gastro-esophageal reflux disease without esophagitis: Secondary | ICD-10-CM

## 2012-01-19 DIAGNOSIS — I252 Old myocardial infarction: Secondary | ICD-10-CM

## 2012-01-19 DIAGNOSIS — I279 Pulmonary heart disease, unspecified: Secondary | ICD-10-CM | POA: Diagnosis present

## 2012-01-19 DIAGNOSIS — C7A8 Other malignant neuroendocrine tumors: Secondary | ICD-10-CM

## 2012-01-19 DIAGNOSIS — M109 Gout, unspecified: Secondary | ICD-10-CM

## 2012-01-19 DIAGNOSIS — Z6827 Body mass index (BMI) 27.0-27.9, adult: Secondary | ICD-10-CM

## 2012-01-19 DIAGNOSIS — I519 Heart disease, unspecified: Secondary | ICD-10-CM

## 2012-01-19 DIAGNOSIS — F329 Major depressive disorder, single episode, unspecified: Secondary | ICD-10-CM | POA: Diagnosis present

## 2012-01-19 DIAGNOSIS — E785 Hyperlipidemia, unspecified: Secondary | ICD-10-CM

## 2012-01-19 DIAGNOSIS — Z9089 Acquired absence of other organs: Secondary | ICD-10-CM

## 2012-01-19 DIAGNOSIS — Z79899 Other long term (current) drug therapy: Secondary | ICD-10-CM

## 2012-01-19 DIAGNOSIS — R1013 Epigastric pain: Secondary | ICD-10-CM

## 2012-01-19 DIAGNOSIS — K5731 Diverticulosis of large intestine without perforation or abscess with bleeding: Principal | ICD-10-CM | POA: Diagnosis present

## 2012-01-19 DIAGNOSIS — Z9861 Coronary angioplasty status: Secondary | ICD-10-CM

## 2012-01-19 DIAGNOSIS — D6832 Hemorrhagic disorder due to extrinsic circulating anticoagulants: Secondary | ICD-10-CM

## 2012-01-19 DIAGNOSIS — Z7982 Long term (current) use of aspirin: Secondary | ICD-10-CM

## 2012-01-19 DIAGNOSIS — D72829 Elevated white blood cell count, unspecified: Secondary | ICD-10-CM

## 2012-01-19 DIAGNOSIS — Z91041 Radiographic dye allergy status: Secondary | ICD-10-CM

## 2012-01-19 DIAGNOSIS — C9110 Chronic lymphocytic leukemia of B-cell type not having achieved remission: Secondary | ICD-10-CM | POA: Diagnosis present

## 2012-01-19 DIAGNOSIS — G609 Hereditary and idiopathic neuropathy, unspecified: Secondary | ICD-10-CM

## 2012-01-19 DIAGNOSIS — Z90710 Acquired absence of both cervix and uterus: Secondary | ICD-10-CM

## 2012-01-19 DIAGNOSIS — D649 Anemia, unspecified: Secondary | ICD-10-CM

## 2012-01-19 DIAGNOSIS — R110 Nausea: Secondary | ICD-10-CM

## 2012-01-19 DIAGNOSIS — I803 Phlebitis and thrombophlebitis of lower extremities, unspecified: Secondary | ICD-10-CM

## 2012-01-19 DIAGNOSIS — F3289 Other specified depressive episodes: Secondary | ICD-10-CM | POA: Diagnosis present

## 2012-01-19 DIAGNOSIS — E7800 Pure hypercholesterolemia, unspecified: Secondary | ICD-10-CM

## 2012-01-19 DIAGNOSIS — I4891 Unspecified atrial fibrillation: Secondary | ICD-10-CM

## 2012-01-19 DIAGNOSIS — I4821 Permanent atrial fibrillation: Secondary | ICD-10-CM

## 2012-01-19 DIAGNOSIS — Z794 Long term (current) use of insulin: Secondary | ICD-10-CM

## 2012-01-19 DIAGNOSIS — Z8509 Personal history of malignant neoplasm of other digestive organs: Secondary | ICD-10-CM

## 2012-01-19 DIAGNOSIS — Z951 Presence of aortocoronary bypass graft: Secondary | ICD-10-CM

## 2012-01-19 DIAGNOSIS — I209 Angina pectoris, unspecified: Secondary | ICD-10-CM

## 2012-01-19 DIAGNOSIS — T45515A Adverse effect of anticoagulants, initial encounter: Secondary | ICD-10-CM

## 2012-01-19 DIAGNOSIS — Z885 Allergy status to narcotic agent status: Secondary | ICD-10-CM

## 2012-01-19 DIAGNOSIS — Z90410 Acquired total absence of pancreas: Secondary | ICD-10-CM

## 2012-01-19 DIAGNOSIS — K863 Pseudocyst of pancreas: Secondary | ICD-10-CM

## 2012-01-19 DIAGNOSIS — D509 Iron deficiency anemia, unspecified: Secondary | ICD-10-CM

## 2012-01-19 DIAGNOSIS — Z888 Allergy status to other drugs, medicaments and biological substances status: Secondary | ICD-10-CM

## 2012-01-19 DIAGNOSIS — Z8719 Personal history of other diseases of the digestive system: Secondary | ICD-10-CM

## 2012-01-19 LAB — CBC WITH DIFFERENTIAL/PLATELET
Basophils Absolute: 0.1 10*3/uL (ref 0.0–0.1)
Eosinophils Absolute: 0.6 10*3/uL (ref 0.0–0.7)
Eosinophils Absolute: 0.4 10*3/uL (ref 0.0–0.7)
Hemoglobin: 11.2 g/dL — ABNORMAL LOW (ref 12.0–15.0)
Lymphocytes Relative: 24.3 % (ref 12.0–46.0)
Lymphocytes Relative: 29 % (ref 12–46)
Lymphs Abs: 5.8 10*3/uL — ABNORMAL HIGH (ref 0.7–4.0)
MCH: 30.7 pg (ref 26.0–34.0)
MCHC: 31.8 g/dL (ref 30.0–36.0)
MCV: 93.7 fL (ref 78.0–100.0)
Monocytes Relative: 6 % (ref 3–12)
Neutro Abs: 13.3 10*3/uL — ABNORMAL HIGH (ref 1.4–7.7)
Neutrophils Relative %: 65.8 % (ref 43.0–77.0)
Neutrophils Relative %: 62 % (ref 43–77)
Platelets: 411 10*3/uL — ABNORMAL HIGH (ref 150.0–400.0)
RBC: 3.65 MIL/uL — ABNORMAL LOW (ref 3.87–5.11)
RDW: 15.3 % — ABNORMAL HIGH (ref 11.5–14.6)

## 2012-01-19 LAB — HEMOGLOBIN: Hemoglobin: 10 g/dL — ABNORMAL LOW (ref 12.0–15.0)

## 2012-01-19 LAB — GLUCOSE, CAPILLARY: Glucose-Capillary: 210 mg/dL — ABNORMAL HIGH (ref 70–99)

## 2012-01-19 LAB — COMPREHENSIVE METABOLIC PANEL
Alkaline Phosphatase: 97 U/L (ref 39–117)
BUN: 14 mg/dL (ref 6–23)
CO2: 26 mEq/L (ref 19–32)
GFR calc Af Amer: >90 mL/min (ref >90)
GFR calc non Af Amer: 83 mL/min — ABNORMAL LOW (ref >90)
Glucose, Bld: 204 mg/dL — ABNORMAL HIGH (ref 70–99)
Potassium: 3.7 mEq/L (ref 3.5–5.1)
Total Bilirubin: 0.4 mg/dL (ref 0.3–1.2)
Total Protein: 7.7 g/dL (ref 6.0–8.3)

## 2012-01-19 LAB — PROTIME-INR: Prothrombin Time: 22.9 seconds — ABNORMAL HIGH (ref 11.6–15.2)

## 2012-01-19 LAB — SAMPLE TO BLOOD BANK

## 2012-01-19 MED ORDER — METOPROLOL TARTRATE 25 MG PO TABS
25.0000 mg | ORAL_TABLET | Freq: Two times a day (BID) | ORAL | Status: DC
  Administered 2012-01-19 – 2012-01-20 (×3): 25 mg via ORAL
  Filled 2012-01-19 (×4): qty 1

## 2012-01-19 MED ORDER — NITROGLYCERIN 0.4 MG SL SUBL: 0.4000 mg | SUBLINGUAL_TABLET | SUBLINGUAL | Status: DC | PRN

## 2012-01-19 MED ORDER — ONDANSETRON HCL 4 MG PO TABS: 4.0000 mg | ORAL_TABLET | Freq: Four times a day (QID) | ORAL | Status: DC | PRN

## 2012-01-19 MED ORDER — ONDANSETRON HCL 4 MG PO TABS: 4.0000 mg | ORAL_TABLET | Freq: Three times a day (TID) | ORAL | Status: DC | PRN

## 2012-01-19 MED ORDER — POLYETHYLENE GLYCOL 3350 17 G PO PACK: 17.0000 g | PACK | Freq: Every day | ORAL | Status: DC | PRN

## 2012-01-19 MED ORDER — SODIUM CHLORIDE 0.9 % IV SOLN
INTRAVENOUS | Status: DC
  Administered 2012-01-19: 09:00:00 via INTRAVENOUS
  Administered 2012-01-19: 75 mL via INTRAVENOUS

## 2012-01-19 MED ORDER — PANTOPRAZOLE SODIUM 20 MG PO TBEC: 20.0000 mg | DELAYED_RELEASE_TABLET | Freq: Every day | ORAL | Status: DC

## 2012-01-19 MED ORDER — COLCHICINE 0.6 MG PO TABS: 0.6000 mg | ORAL_TABLET | Freq: Every day | ORAL | Status: DC | PRN

## 2012-01-19 MED ORDER — IRBESARTAN 300 MG PO TABS
300.0000 mg | ORAL_TABLET | Freq: Every day | ORAL | Status: DC
  Filled 2012-01-19 (×2): qty 1

## 2012-01-19 MED ORDER — SODIUM CHLORIDE 0.9 % IJ SOLN: 3.0000 mL | Freq: Two times a day (BID) | INTRAMUSCULAR | Status: DC

## 2012-01-19 MED ORDER — POLYETHYLENE GLYCOL 3350 17 G PO PACK
17.0000 g | PACK | Freq: Every day | ORAL | Status: DC
  Administered 2012-01-19: 17 g via ORAL
  Filled 2012-01-19 (×2): qty 1

## 2012-01-19 MED ORDER — FEBUXOSTAT 40 MG PO TABS
40.0000 mg | ORAL_TABLET | Freq: Every day | ORAL | Status: DC
  Filled 2012-01-19 (×4): qty 1

## 2012-01-19 MED ORDER — ONDANSETRON HCL 4 MG/2ML IJ SOLN: 4.0000 mg | Freq: Four times a day (QID) | INTRAMUSCULAR | Status: DC | PRN

## 2012-01-19 MED ORDER — COLESEVELAM HCL 625 MG PO TABS
625.0000 mg | ORAL_TABLET | Freq: Three times a day (TID) | ORAL | Status: DC
  Filled 2012-01-19 (×8): qty 1

## 2012-01-19 MED ORDER — EZETIMIBE 10 MG PO TABS
10.0000 mg | ORAL_TABLET | Freq: Every day | ORAL | Status: DC
  Administered 2012-01-19: 10 mg via ORAL
  Filled 2012-01-19 (×2): qty 1

## 2012-01-19 MED ORDER — AMLODIPINE BESYLATE 5 MG PO TABS
5.0000 mg | ORAL_TABLET | Freq: Every day | ORAL | Status: DC
  Administered 2012-01-19: 5 mg via ORAL
  Filled 2012-01-19 (×2): qty 1

## 2012-01-19 MED ORDER — GABAPENTIN 300 MG PO CAPS
300.0000 mg | ORAL_CAPSULE | Freq: Three times a day (TID) | ORAL | Status: DC
  Administered 2012-01-19 – 2012-01-20 (×5): 300 mg via ORAL
  Filled 2012-01-19 (×6): qty 1

## 2012-01-19 MED ORDER — VITAMIN K1 10 MG/ML IJ SOLN
2.0000 mg | Freq: Once | INTRAMUSCULAR | Status: AC
  Administered 2012-01-19: 2 mg via SUBCUTANEOUS
  Filled 2012-01-19: qty 1

## 2012-01-19 MED ORDER — PANTOPRAZOLE SODIUM 40 MG PO TBEC
40.0000 mg | DELAYED_RELEASE_TABLET | Freq: Every day | ORAL | Status: DC
  Administered 2012-01-20: 40 mg via ORAL
  Filled 2012-01-19 (×2): qty 1

## 2012-01-19 NOTE — ED Notes (Addendum)
Pt c/o blood in stool since yesterday. Denies weakness/dizziness/cp/sob. Pt on coumadin. nad noted.

## 2012-01-19 NOTE — ED Provider Notes (Signed)
History  This chart was scribed for [name], MD by Ladona Ridgel Day. This patient was seen in room APA14/APA14 and the patient's care was started at 0819.   CSN: [id number]  Arrival date & time 01/19/12  7829   First MD Initiated Contact with Patient 01/19/12 [phone]      Chief Complaint  Patient presents with  . Rectal Bleeding   Patient is a 78 y.o. female presenting with hematochezia. The history is provided by the patient. No language interpreter was used.  Rectal Bleeding  The current episode started today. The problem occurs occasionally. The problem has been unchanged. The patient is experiencing no pain. The stool is described as mixed with blood. Pertinent negatives include no fever, no diarrhea, no nausea, no vomiting and no chest pain. She has been eating and drinking normally.   [name] is a 78 y.o. female who presents to the Emergency Department complaining of blood mixed stool with 2 bowel movements since this AM. She states she is unsure of the amount of blood present. She states she felt fine yesterday and denies abdominal pain, nausea, light headidness or dizziness. She has had one previous episode which she states was not as bad as today (sounds like she had a + hemocult test) and was last treated by Dr. Karilyn Cota who performed a  Normal colonoscopy a year ago.  At that time she was  on Plavix but was switched and still taking Coumadin, which her INR was 3.1 yesterday. She did not take her coumadin last night.  Her PCP is Dr. Dwana Melena  Past Medical History  Diagnosis Date  . HTN (hypertension)   . Coronary atherosclerosis of unspecified type of vessel, native or graft     a. remote CABG in 1999 with Dr. Laneta Simmers. b. s/p PCI with stent to LCX in 2009 (Platinum protocol study). c. NSTEMI 10/2011 s/p BMS to SVG->diagonal.  . Personal history of other diseases of digestive system   . Gout, unspecified     "have had it in my hands and my feet"  . Unspecified hereditary and  idiopathic peripheral neuropathy   . Esophageal reflux   . Anemia   . GI bleed   . Leukocytosis 03/16/2011  . Hyperlipidemia     Statin intolerant  . Pancreas disorder     prior pancreatectomy in 2011 complicated by leak requiring prolong and repeated drainage and subsequent pancreatic stent  . Other chronic pulmonary heart diseases   . Type I (juvenile type) diabetes mellitus without mention of complication, not stated as uncontrolled     Previously on insulin pump  . Permanent atrial fibrillation   . Anginal pain 11/24/11    "1st time"  . Exertional dyspnea 11/24/11  . Depression   . 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 CLL (chronic lymphocytic leukemia)     seeing Dr. [name]  . Myocardial infarction 1999    "on the verge of one"  . NSTEMI (non-ST elevated myocardial infarction) 10/2011    /H&P    Past Surgical History  Procedure Date  . Oophorectomy 1970's    w hysterectomy and bladder tacking procedure  . Belpharoptosis repair     right upper eyelid  . Esophagogastroduodenoscopy 07/11/2010  . Colonoscopy 10/20/06  . Tonsillectomy 1947  . Appendectomy 1970's?  . Abdominal hysterectomy 1970's  . Coronary angioplasty with stent placement 10/2011    "makes total of 4"; 20-30% ostial left main stenosis; occluded ostial LAD supplied by  IMA graft, 20-30% scattered LCx stenoses with widely patent stent and 20% narrowing; diffuse RCA luminal irregularities up to 20-30%; SVG-diagonal patent, but proximal 90% stenosis s/p BMS; patent LIMA-LAD; LVEF 55-65%.  . Coronary angioplasty with stent placement 2009    LCX  . Cataract extraction w/ intraocular lens  implant, bilateral ~ 2003  . Coronary artery bypass graft 1999    LIMA to LAD, SVG to the DX  . Pancreatectomy 2011    c/b leak requiring multiple drainage attempts and stent placement), /H&P  . Cardiac surgery     Family History  Problem Relation Age of Onset  . Stroke Mother   . Lung cancer  Father   . Healthy Son     History  Substance Use Topics  . Smoking status: Never Smoker   . Smokeless tobacco: Never Used  . Alcohol Use: No  Lives at home  OB History    Grav Para Term Preterm Abortions TAB SAB Ect Mult Living                  Review of Systems  Constitutional: Negative for fever and chills.  Respiratory: Negative for shortness of breath.   Cardiovascular: Negative for chest pain.  Gastrointestinal: Positive for constipation (constipated several days ago but not now), blood in stool and hematochezia. Negative for nausea, vomiting and diarrhea.  Neurological: Negative for dizziness, weakness and light-headedness.  All other systems reviewed and are negative.    Allergies  Codeine; Ivp dye; Morphine; Prednisone; and Statins  Home Medications   Current Outpatient Rx  Name Route Sig Dispense Refill  . AMLODIPINE BESYLATE 10 MG PO TABS Oral Take 0.5 tablets (5 mg total) by mouth daily.    . ASPIRIN 81 MG PO TBEC Oral Take 1 tablet (81 mg total) by mouth daily.    . BD PEN NEEDLE NANO U/F 32G X 4 MM MISC      . VITAMIN D3 50000 UNITS PO CAPS Oral Take 50,000 Units by mouth every 7 (seven) days. TAKES ON THURSDAYS    . COLCHICINE 0.6 MG PO TABS Oral Take 1 tablet (0.6 mg total) by mouth daily. Please call your primary doctor to ask if you should still continue to take this. Pharmacy left a note stating that your primary doctor may have planned to stop this at some point, so please clarify this with Dr. [name].    . COLESEVELAM HCL 625 MG PO TABS Oral Take 625 mg by mouth 3 (three) times daily.    Marland Kitchen ESCITALOPRAM OXALATE 5 MG PO TABS Oral Take 5 mg by mouth daily.     Marland Kitchen EZETIMIBE 10 MG PO TABS Oral Take 1 tablet (10 mg total) by mouth daily. 30 tablet 6  . FEBUXOSTAT 40 MG PO TABS Oral Take 40 mg by mouth daily.    Marland Kitchen GABAPENTIN 300 MG PO CAPS Oral Take 300 mg by mouth 3 (three) times daily.     . INSULIN ASPART 100 UNIT/ML [place] SOLN Subcutaneous Inject 10-12 Units  into the skin 3 (three) times daily as needed. FlexPen - Inject into skin subcutaneously. 10 units with breakfast, 10 units with lunch, and 12 units with supper.    . INSULIN GLARGINE 100 UNIT/ML [place] SOLN  Solostar Pen - Inject into skin subcutaneously. 44 units daily in AM and 26 units at bedtime. 1 pen 1    Follow up with your primary doctor for this medici ...  . MAGNESIUM 500 MG PO TABS Oral Take  1 tablet by mouth 2 (two) times daily.    Marland Kitchen METOPROLOL TARTRATE 25 MG PO TABS Oral Take 25 mg by mouth 2 (two) times daily.    Marland Kitchen NITROGLYCERIN 0.4 MG SL SUBL Sublingual Place 1 tablet (0.4 mg total) under the tongue every 5 (five) minutes x 3 doses as needed for chest pain. 25 tablet 4  . OLMESARTAN MEDOXOMIL 40 MG PO TABS Oral Take 40 mg by mouth daily.    Marland Kitchen ONDANSETRON HCL 4 MG PO TABS Oral Take 1 tablet (4 mg total) by mouth every 8 (eight) hours as needed for nausea. 30 tablet 1  . PANTOPRAZOLE SODIUM 20 MG PO TBEC Oral Take 40 mg by mouth daily.    Marland Kitchen POLYETHYLENE GLYCOL 3350 PO PACK Oral Take 17 g by mouth daily as needed (constipation).    Marland Kitchen PRENATAL VITAMINS 0.8 MG PO TABS Oral Take 1 tablet by mouth daily.     Marland Kitchen PROMETHAZINE HCL 12.5 MG PO TABS Oral Take 1 tablet (12.5 mg total) by mouth as needed for nausea.    Marland Kitchen TRAMADOL HCL 50 MG PO TABS Oral Take 50 mg by mouth daily as needed. pain    . WARFARIN SODIUM 2.5 MG PO TABS Oral Take 1 tablet (2.5 mg total) by mouth daily. 14 tablet 0    Triage Vitals: BP 147/52  Pulse 90  Temp 97.9 F (36.6 C)  Resp 20  Ht 5\' 4"  (1.626 m)  Wt 164 lb (74.39 kg)  BMI 28.15 kg/m2  SpO2 96%  Vital signs normal    Physical Exam  Nursing note and vitals reviewed. Constitutional: She is oriented to person, place, and time. She appears well-developed and well-nourished.  Non-toxic appearance. She does not appear ill. No distress.  HENT:  Head: Normocephalic and atraumatic.  Right Ear: External ear normal.  Left Ear: External ear normal.  Nose: Nose  normal. No mucosal edema or rhinorrhea.  Mouth/Throat: Mucous membranes are normal. No dental abscesses or uvula swelling.       Dry mucous membranes  Eyes: Conjunctivae normal and EOM are normal. Pupils are equal, round, and reactive to light.  Neck: Normal range of motion and full passive range of motion without pain. Neck supple.  Cardiovascular: Normal rate, regular rhythm and normal heart sounds.  Exam reveals no gallop and no friction rub.   No murmur heard. Pulmonary/Chest: Effort normal and breath sounds normal. No respiratory distress. She has no wheezes. She has no rhonchi. She has no rales. She exhibits no tenderness and no crepitus.  Abdominal: Soft. Normal appearance. She exhibits no distension. There is no tenderness. There is no rebound and no guarding.       Diminished bowel sounds  Genitourinary:       maroon colored blood  Musculoskeletal: Normal range of motion. She exhibits no edema and no tenderness.       Moves all extremities well.   Neurological: She is alert and oriented to person, place, and time. She has normal strength. No cranial nerve deficit.  Skin: Skin is warm, dry and intact. No rash noted. No erythema. There is pallor.  Psychiatric: She has a normal mood and affect. Her speech is normal and behavior is normal. Her mood appears not anxious.    ED Course  Procedures (including critical care time)   Medications  0.9 %  sodium chloride infusion (  Intravenous New Bag/Given 01/19/12 0924)    DIAGNOSTIC STUDIES: Oxygen Saturation is 94% on room air, adequate  by my interpretation.    COORDINATION OF CARE: At 900 AM Discussed treatment plan with patient which includes rectal exam. Patient agrees.    10:16 no more passing blood while in the ED, states she feels fine  10:26 Dr Karilyn Cota will see at lunchtime, wants hospitalist to admit. States had last  EDG last year and colonoscopy before that.   10:46 Dr Karilyn Cota will admit  Results for orders placed  during the hospital encounter of 01/19/12  CBC WITH DIFFERENTIAL      Component Value Range   WBC 19.7 (*) 4.0 - 10.5 K/uL   RBC 3.65 (*) 3.87 - 5.11 MIL/uL   Hemoglobin 11.2 (*) 12.0 - 15.0 g/dL   HCT 91.4 (*) 78.2 - 95.6 %   MCV 93.7  78.0 - 100.0 fL   MCH 30.7  26.0 - 34.0 pg   MCHC 32.7  30.0 - 36.0 g/dL   RDW 21.3  08.6 - 57.8 %   Platelets 500 (*) 150 - 400 K/uL   Neutrophils Relative 62  43 - 77 %   Neutro Abs 12.3 (*) 1.7 - 7.7 K/uL   Lymphocytes Relative 29  12 - 46 %   Lymphs Abs 5.8 (*) 0.7 - 4.0 K/uL   Monocytes Relative 6  3 - 12 %   Monocytes Absolute 1.2 (*) 0.1 - 1.0 K/uL   Eosinophils Relative 2  0 - 5 %   Eosinophils Absolute 0.4  0.0 - 0.7 K/uL   Basophils Relative 0  0 - 1 %   Basophils Absolute 0.1  0.0 - 0.1 K/uL  COMPREHENSIVE METABOLIC PANEL      Component Value Range   Sodium 138  135 - 145 mEq/L   Potassium 3.7  3.5 - 5.1 mEq/L   Chloride 100  96 - 112 mEq/L   CO2 26  19 - 32 mEq/L   Glucose, Bld 204 (*) 70 - 99 mg/dL   BUN 14  6 - 23 mg/dL   Creatinine, Ser 4.69  0.50 - 1.10 mg/dL   Calcium 62.9  8.4 - 52.8 mg/dL   Total Protein 7.7  6.0 - 8.3 g/dL   Albumin 3.9  3.5 - 5.2 g/dL   AST 19  0 - 37 U/L   ALT 12  0 - 35 U/L   Alkaline Phosphatase 97  39 - 117 U/L   Total Bilirubin 0.4  0.3 - 1.2 mg/dL   GFR calc non Af Amer 83 (*) >90 mL/min   GFR calc Af Amer >90  >90 mL/min  SAMPLE TO BLOOD BANK      Component Value Range   Blood Bank Specimen SAMPLE AVAILABLE FOR TESTING     Sample Expiration 01/22/2012    APTT      Component Value Range   aPTT 40 (*) 24 - 37 seconds  PROTIME-INR      Component Value Range   Prothrombin Time 22.9 (*) 11.6 - 15.2 seconds   INR 2.13 (*) 0.00 - 1.49  OCCULT BLOOD, POC DEVICE      Component Value Range   Fecal Occult Bld POSITIVE     Laboratory interpretation all normal except therapeutic INR, hyperglycemia, leukocytosis, mild stable anemia   1. Rectal bleeding   2. Warfarin-induced coagulopathy   3.  Anemia    Plan admit  Devoria Albe, MD, Armando Gang'   MDM   I personally performed the services described in this documentation, which was scribed in my presence. The recorded information has  been reviewed and considered.  Devoria Albe, MD, FACEP          [name], MD 01/19/12 1049  [name], MD 01/19/12 1102

## 2012-01-19 NOTE — Consult Note (Signed)
Reason for consultation; Rectal bleeding in a patient who is anticoagulated. History of present illness; Patient is 78 year old Caucasian female with multiple medical problems who is well known to me who was seen in the office for nausea and abdominal pain about 3 weeks was in usual state of health until 6:00 this morning when she began passing bright red blood rectum. She had multiple bowel movements and also noted burgundy or maroonish blood. She did not experience abdominal pain nausea vomiting or postal symptoms. She states she may have passed small amount of blood yesterday but is not sure. Patient saw Dr. Riley Kill her cardiologist yesterday her hemoglobin was 10.6. She also had her INR checked was 3.1. She is advised to take Coumadin dose. She had 2 bowel movements in emergency room. She's had 2 more since she's been in her room. Last time she noted small volume maroonish blood. She is hungry and anxious to go home. She also takes low-dose aspirin but does not use any other OTC NSAIDs. She was recently seen by Dr. Catalina Pizza for hypogastric pain. She was begun on Cipro. Home medications; Prior to Admission medications   Medication Sig Start Date End Date Taking? Authorizing Provider  amLODipine (NORVASC) 10 MG tablet Take 0.5 tablets (5 mg total) by mouth daily. 11/14/11  Yes Dayna N Dunn, PA  aspirin (ASPIRIN EC) 81 MG EC tablet Take 1 tablet (81 mg total) by mouth daily. 11/14/11 11/13/12 Yes Dayna N Dunn, PA  Cholecalciferol (VITAMIN D3) 50000 UNITS CAPS Take 50,000 Units by mouth every 7 (seven) days. TAKES ON THURSDAYS   Yes Historical Provider, MD  colchicine 0.6 MG tablet Take 0.6 mg by mouth daily as needed. Gout Flare   Yes Historical Provider, MD  colesevelam (WELCHOL) 625 MG tablet Take 625 mg by mouth 3 (three) times daily. 11/14/11 11/13/12 Yes Dayna N Dunn, PA  ezetimibe (ZETIA) 10 MG tablet Take 1 tablet (10 mg total) by mouth daily. 11/14/11 11/13/12 Yes Dayna N Dunn, PA  febuxostat (ULORIC) 40  MG tablet Take 40 mg by mouth daily.   Yes Historical Provider, MD  gabapentin (NEURONTIN) 300 MG capsule Take 300 mg by mouth 3 (three) times daily.    Yes Historical Provider, MD  insulin aspart (NOVOLOG) 100 UNIT/ML injection Inject 10-12 Units into the skin 3 (three) times daily as needed. FlexPen - Inject into skin subcutaneously. 10 units with breakfast, 10 units with lunch, and 12 units with supper. If sugar is running high patient may take an additional 2-4 units. 11/14/11  Yes Dayna N Dunn, PA  insulin glargine (LANTUS) 100 UNIT/ML injection Solostar Pen - Inject into skin subcutaneously. 44 units daily in AM and 26 units at bedtime. 11/14/11  Yes Dayna N Dunn, PA  metoprolol tartrate (LOPRESSOR) 25 MG tablet Take 25 mg by mouth 2 (two) times daily. 11/14/11 11/13/12 Yes Dayna N Dunn, PA  olmesartan (BENICAR) 40 MG tablet Take 40 mg by mouth daily. 11/14/11  Yes Dayna N Dunn, PA  ondansetron (ZOFRAN) 4 MG tablet Take 1 tablet (4 mg total) by mouth every 8 (eight) hours as needed for nausea. 12/28/11  Yes Malissa Hippo, MD  pantoprazole (PROTONIX) 20 MG tablet Take 20 mg by mouth daily.    Yes Historical Provider, MD  polyethylene glycol (MIRALAX / GLYCOLAX) packet Take 17 g by mouth daily. 11/14/11  Yes Dayna N Dunn, PA  nitroGLYCERIN (NITROSTAT) 0.4 MG SL tablet Place 1 tablet (0.4 mg total) under the tongue every 5 (five)  minutes x 3 doses as needed for chest pain. 11/14/11 11/13/12  Dayna N Dunn, PA  warfarin (COUMADIN) 2.5 MG tablet Take 1.25-2.5 mg by mouth daily. Takes 2.5 mg everyday besides on Thursday, takes 1.25 mg. 12/30/11   Herby Abraham, MD  Current medications; Current Facility-Administered Medications  Medication Dose Route Frequency Provider Last Rate Last Dose  . 0.9 %  sodium chloride infusion   Intravenous Continuous [name], MD 75 mL/hr at 01/19/12 1343    . amLODipine (NORVASC) tablet 5 mg  5 mg Oral Daily Nimish Normajean Glasgow, MD   5 mg at 01/19/12 1412  . colchicine tablet 0.6 mg   0.6 mg Oral Daily PRN Wilson Singer, MD      . colesevelam [facility]) tablet 625 mg  625 mg Oral TID Wilson Singer, MD      . ezetimibe (ZETIA) tablet 10 mg  10 mg Oral Daily Nimish Normajean Glasgow, MD   10 mg at 01/19/12 1412  . febuxostat (ULORIC) tablet 40 mg  40 mg Oral Daily Nimish C Gosrani, MD      . gabapentin (NEURONTIN) capsule 300 mg  300 mg Oral TID Wilson Singer, MD   300 mg at 01/19/12 1634  . irbesartan (AVAPRO) tablet 300 mg  300 mg Oral Daily Nimish C Gosrani, MD      . metoprolol tartrate (LOPRESSOR) tablet 25 mg  25 mg Oral BID Nimish C Gosrani, MD      . nitroGLYCERIN (NITROSTAT) SL tablet 0.4 mg  0.4 mg Sublingual Q5 Min x 3 PRN Nimish C Gosrani, MD      . ondansetron (ZOFRAN) tablet 4 mg  4 mg Oral Q6H PRN Nimish Normajean Glasgow, MD       Or  . ondansetron (ZOFRAN) injection 4 mg  4 mg Intravenous Q6H PRN Nimish C Gosrani, MD      . pantoprazole (PROTONIX) EC tablet 40 mg  40 mg Oral QAC breakfast Nimish C Gosrani, MD      . phytonadione (VITAMIN K) SQ injection 2 mg  2 mg Subcutaneous Once Wilson Singer, MD   2 mg at 01/19/12 1634  . polyethylene glycol (MIRALAX / GLYCOLAX) packet 17 g  17 g Oral Daily PRN Nimish C Gosrani, MD      . polyethylene glycol (MIRALAX / GLYCOLAX) packet 17 g  17 g Oral Daily Nimish C Gosrani, MD   17 g at 01/19/12 1412  . sodium chloride 0.9 % injection 3 mL  3 mL Intravenous Q12H Nimish C Gosrani, MD      . DISCONTD: ondansetron (ZOFRAN) tablet 4 mg  4 mg Oral Q8H PRN Nimish C Gosrani, MD      . DISCONTD: pantoprazole (PROTONIX) EC tablet 20 mg  20 mg Oral Daily Nimish Normajean Glasgow, MD       Past medical history; Chronic GERD. EGD years ago revealed erosive esophagitis. Small adenoma was removed from her colon in 1998. Last colonoscopy was in July 2008 at [facility] in [place] revealing left and right-sided diverticulosis. Last year she was evaluated for GI bleed and underwent EGD and no bleeding lesion was found. It  was thought she had colonic bleed but did not undergo colonoscopy. 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 Coronary artery disease. Status post CABG in 1999.  She nondistended he in July 2013 and had bare-metal stent to SVG. Atrial fibrillation. She was begun on warfarin about 2 months Ago. Diabetes mellitus of several years duration. Hypertension. Peripheral neuropathy. Hyperlipidemia. Gout. History of hypomagnesemia requiring IV replacement not responding to oral therapy. Status post tonsillectomy years ago. BSO with hysterectomy several years ago. Eye surgery to correct bilateral ptosis in 2003. Allergies; Allergies  Allergen Reactions  . Codeine Nausea And Vomiting  . Ivp Dye (Iodinated Diagnostic Agents) Other (See Comments)    Burst veins in her arm.   . Morphine Other (See Comments)    Not in Right State of Mind.   . Prednisone Other (See Comments)    Patient became delirious/combative when given 60mg  dose  . Statins Other (See Comments)    Leg Cramps.    Family history; Both parents are deceased. Father died of lung carcinoma at 61 and mother of CVA at 34. She had one sister who is deceased. Social history; She is widowed. She has a son who lives in [place], [place]. She has been retired for number of years. She worked at [employer]. He does not smoke cigarettes or drink alcohol. Her longtime friend Marchia Meiers is in the room with her.  Physical exam; BP 149/75  Pulse 104  Temp 97.8 F (36.6 C) (Oral)  Resp 20  Ht 5\' 4"  (1.626 m)  Wt 162 lb 3.2 oz (73.573 kg)  BMI 27.84 kg/m2  SpO2 95% Patient is alert and in no acute distress. Conjunctiva is pink. Sclera is nonicteric. Oropharyngeal mucosa is normal. No neck masses or thyromegaly noted. Cardiac exam  with irregular rhythm, normal S1 and S2. No murmur or gallop noted. Lungs are clear to auscultation. Abdomen is full. Bowel sounds are normal. Abdomen is soft and nontender. Fullness/firmness noted in epigastric region felt to be related to prior percutaneous drains. No organomegaly or masses noted. Rectal examination deferred. Lab data; WBC 19.7, H&H 11.2 and 34.2, platelet count 500K.  H&H yesterday when she saw Dr. Nancy Marus was 10.6 and 33.3. INR 2.13. Comprehensive chemistry panel is normal except glucose of 204. Assessment; Patient is 78 year old Caucasian female with multiple medical problems who is anticoagulated because of atrial fibrillation who presents with large volume painless hematochezia. She has history of colonic diverticulosis and felt to be the source of her bleed. INR is within therapeutic range. She is hemodynamically stable. Patient's last colonoscopy was in July 2008. She had overt GI bleed last year when she had EGD which was negative and she was not interested in colonoscopy. At the present time she is not sure if she wants to proceed with colonoscopy but wants to think about it. Recommendations; Serial H&H  as you're doing. Vitamin K 2 mg subcutaneous to bring INR down to 1.5. Patient will be reevaluated in a.m. and we'll consider colonoscopy if she is agreeable. Please note patient's condition was reviewed with Dr. Nancy Marus who is her primary cardiologist. Anticoagulation would be on hold until she has seen him next week.

## 2012-01-19 NOTE — H&P (Signed)
[facility] History and Physical  [name] ZOX:[id number] DOB: [birth year] DOA: 01/19/2012  Referring physician: Dr. Lynelle Doctor, ER physician. PCP: Dwana Melena, MD  Specialists: Dr Karilyn Cota, gastroenterology.  Chief Complaint: Painless  Rectal bleeding.  HPI: [name] is a 78 y.o. female presents with the above symptoms which started this morning. In fact, she has only had one episode of rectal bleeding and it was painless. She denies any abdominal pain, nausea or vomiting. There is no hematemesis. She is on Coumadin for chronic atrial fibrillation. She denies any dizziness or lightheadedness with this rectal bleeding.  Review of Systems:  Apart from history of present illness other systems negative.  Past Medical History  Diagnosis Date  . HTN (hypertension)   . Coronary atherosclerosis of unspecified type of vessel, native or graft     a. remote CABG in 1999 with Dr. Laneta Simmers. b. s/p PCI with stent to LCX in 2009 (Platinum protocol study). c. NSTEMI 10/2011 s/p BMS to SVG->diagonal.  . Personal history of other diseases of digestive system   . Gout, unspecified     "have had it in my hands and my feet"  . Unspecified hereditary and idiopathic peripheral neuropathy   . Esophageal reflux   . Anemia   . GI bleed   . Leukocytosis 03/16/2011  . Hyperlipidemia     Statin intolerant  . Pancreas disorder     prior pancreatectomy in 2011 complicated by leak requiring prolong and repeated drainage and subsequent pancreatic stent  . Other chronic pulmonary heart diseases   . Type I (juvenile type) diabetes mellitus without mention of complication, not stated as uncontrolled     Previously on insulin pump  . Permanent atrial fibrillation   . Anginal pain 11/24/11    "1st time"  . Exertional dyspnea 11/24/11  . Depression   . 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 CLL (chronic lymphocytic leukemia)     seeing Dr. [name]  . Myocardial infarction 1999    "on  the verge of one"  . NSTEMI (non-ST elevated myocardial infarction) 10/2011    /H&P   Past Surgical History  Procedure Date  . Oophorectomy 1970's    w hysterectomy and bladder tacking procedure  . Belpharoptosis repair     right upper eyelid  . Esophagogastroduodenoscopy 07/11/2010  . Colonoscopy 10/20/06  . Tonsillectomy 1947  . Appendectomy 1970's?  . Abdominal hysterectomy 1970's  . Coronary angioplasty with stent placement 10/2011    "makes total of 4"; 20-30% ostial left main stenosis; occluded ostial LAD supplied by IMA graft, 20-30% scattered LCx stenoses with widely patent stent and 20% narrowing; diffuse RCA luminal irregularities up to 20-30%; SVG-diagonal patent, but proximal 90% stenosis s/p BMS; patent LIMA-LAD; LVEF 55-65%.  . Coronary angioplasty with stent placement 2009    LCX  . Cataract extraction w/ intraocular lens  implant, bilateral ~ 2003  . Coronary artery bypass graft 1999    LIMA to LAD, SVG to the DX  . Pancreatectomy 2011    c/b leak requiring multiple drainage attempts and stent placement), /H&P  . Cardiac surgery    Social History:  She lives alone. She has a constant  caregiver. She does not drink alcohol. She does not smoke cigarettes.  Allergies  Allergen Reactions  . Codeine Nausea And Vomiting  . Ivp Dye (Iodinated Diagnostic Agents) Other (See Comments)    Burst veins in her arm.   . Morphine Other (See Comments)    Not in  Right State of Mind.   . Prednisone Other (See Comments)    Patient became delirious/combative when given 60mg  dose  . Statins Other (See Comments)    Leg Cramps.     Family History  Problem Relation Age of Onset  . Stroke Mother   . Lung cancer Father   . Healthy Son       Prior to Admission medications   Medication Sig Start Date End Date Taking? Authorizing Provider  amLODipine (NORVASC) 10 MG tablet Take 0.5 tablets (5 mg total) by mouth daily. 11/14/11  Yes Dayna N Dunn, PA  aspirin (ASPIRIN EC) 81 MG EC  tablet Take 1 tablet (81 mg total) by mouth daily. 11/14/11 11/13/12 Yes Dayna N Dunn, PA  Cholecalciferol (VITAMIN D3) 50000 UNITS CAPS Take 50,000 Units by mouth every 7 (seven) days. TAKES ON THURSDAYS   Yes Historical Provider, MD  colchicine 0.6 MG tablet Take 0.6 mg by mouth daily as needed. Gout Flare   Yes Historical Provider, MD  colesevelam (WELCHOL) 625 MG tablet Take 625 mg by mouth 3 (three) times daily. 11/14/11 11/13/12 Yes Dayna N Dunn, PA  ezetimibe (ZETIA) 10 MG tablet Take 1 tablet (10 mg total) by mouth daily. 11/14/11 11/13/12 Yes Dayna N Dunn, PA  febuxostat (ULORIC) 40 MG tablet Take 40 mg by mouth daily.   Yes Historical Provider, MD  gabapentin (NEURONTIN) 300 MG capsule Take 300 mg by mouth 3 (three) times daily.    Yes Historical Provider, MD  insulin aspart (NOVOLOG) 100 UNIT/ML injection Inject 10-12 Units into the skin 3 (three) times daily as needed. FlexPen - Inject into skin subcutaneously. 10 units with breakfast, 10 units with lunch, and 12 units with supper. If sugar is running high patient may take an additional 2-4 units. 11/14/11  Yes Dayna N Dunn, PA  insulin glargine (LANTUS) 100 UNIT/ML injection Solostar Pen - Inject into skin subcutaneously. 44 units daily in AM and 26 units at bedtime. 11/14/11  Yes Dayna N Dunn, PA  metoprolol tartrate (LOPRESSOR) 25 MG tablet Take 25 mg by mouth 2 (two) times daily. 11/14/11 11/13/12 Yes Dayna N Dunn, PA  olmesartan (BENICAR) 40 MG tablet Take 40 mg by mouth daily. 11/14/11  Yes Dayna N Dunn, PA  ondansetron (ZOFRAN) 4 MG tablet Take 1 tablet (4 mg total) by mouth every 8 (eight) hours as needed for nausea. 12/28/11  Yes Malissa Hippo, MD  pantoprazole (PROTONIX) 20 MG tablet Take 20 mg by mouth daily.    Yes Historical Provider, MD  polyethylene glycol (MIRALAX / GLYCOLAX) packet Take 17 g by mouth daily. 11/14/11  Yes Dayna N Dunn, PA  nitroGLYCERIN (NITROSTAT) 0.4 MG SL tablet Place 1 tablet (0.4 mg total) under the tongue every 5 (five)  minutes x 3 doses as needed for chest pain. 11/14/11 11/13/12  Dayna N Dunn, PA  warfarin (COUMADIN) 2.5 MG tablet Take 1.25-2.5 mg by mouth daily. Takes 2.5 mg everyday besides on Thursday, takes 1.25 mg. 12/30/11   Herby Abraham, MD   Physical Exam: Filed Vitals:   01/19/12 0908 01/19/12 0909 01/19/12 0912 01/19/12 1112  BP: 117/57 135/62 144/73 154/67  Pulse: 93 83 113 97  Temp:    97.8 F (36.6 C)  TempSrc:    Oral  Resp:    20  Height:      Weight:      SpO2:    95%     General:  She looks systemically well. She is obese.  She is sitting in a chair.  Eyes: No pallor. No jaundice there  ENT: No abnormalities.  Neck: No lymphadenopathy.  Cardiovascular: Heart sounds are present and irregularly irregular, consistent with atrial fibrillation. No murmurs. Jugular venous pressure not elevated.  Respiratory: Lung fields are clear.  Abdomen: Soft, nontender, no hepatosplenomegaly. No masses.  Skin: No rash.  Musculoskeletal: No major joint abnormalities apart from osteoarthritis related to aging.  Psychiatric: Appropriate affect.  Neurologic: Alert and orientated without any focal neurological signs.  Labs on Admission:  Basic Metabolic Panel:  Lab 01/19/12 4098  NA 138  K 3.7  CL 100  CO2 26  GLUCOSE 204*  BUN 14  CREATININE 0.61  CALCIUM 10.1  MG --  PHOS --   Liver Function Tests:  Lab 01/19/12 0913  AST 19  ALT 12  ALKPHOS 97  BILITOT 0.4  PROT 7.7  ALBUMIN 3.9     CBC:  Lab 01/19/12 0913  WBC 19.7*  NEUTROABS 12.3*  HGB 11.2*  HCT 34.2*  MCV 93.7  PLT 500*        Assessment/Plan Principal Problem:  *Rectal bleeding Active Problems:  IDDM  HYPERTENSION  Chronic anticoagulation  CAD (coronary artery disease)  Atrial fibrillation   1. Rectal bleeding, hemodynamically stable. No requirement for blood transfusion at the present time. 2. Atrial fibrillation on chronic anticoagulation. 3. Coronary artery disease status post non-ST  elevation MI in July 2013. 4. Hypertension 5. Insulin-dependent diabetes mellitus. Plan: 1. Admit to telemetry. 2. Hold Coumadin. Follow serial hemoglobins. 3. Gastroenterology consultation.  Code Status: Full code.   Family Communication: Plan discussed with patient at bedside.   Disposition Plan: Home in medically stable.   Time spent: 30 minutes.  Wilson Singer [facility] Pager [phone].  If 7PM-7AM, please contact night-coverage [email or web address] Password [email or web address] 01/19/2012, 11:24 AM

## 2012-01-20 ENCOUNTER — Encounter (HOSPITAL_COMMUNITY)
Admission: EM | Disposition: A | Discharge status: Home / Self Care | Payer: Self-pay | Source: Home / Self Care | Attending: Internal Medicine

## 2012-01-20 ENCOUNTER — Encounter (HOSPITAL_COMMUNITY): Payer: Self-pay

## 2012-01-20 DIAGNOSIS — K5730 Diverticulosis of large intestine without perforation or abscess without bleeding: Secondary | ICD-10-CM

## 2012-01-20 DIAGNOSIS — D649 Anemia, unspecified: Secondary | ICD-10-CM

## 2012-01-20 HISTORY — PX: COLONOSCOPY: SHX5424

## 2012-01-20 LAB — COMPREHENSIVE METABOLIC PANEL
ALT: 9 U/L (ref 0–35)
AST: 16 U/L (ref 0–37)
Alkaline Phosphatase: 75 U/L (ref 39–117)
CO2: 24 mEq/L (ref 19–32)
Calcium: 9.3 mg/dL (ref 8.4–10.5)
Chloride: 103 mEq/L (ref 96–112)
GFR calc Af Amer: >90 mL/min (ref >90)
GFR calc non Af Amer: 85 mL/min — ABNORMAL LOW (ref >90)
Glucose, Bld: 203 mg/dL — ABNORMAL HIGH (ref 70–99)
Potassium: 3.7 mEq/L (ref 3.5–5.1)
Sodium: 139 mEq/L (ref 135–145)

## 2012-01-20 LAB — GLUCOSE, CAPILLARY
Glucose-Capillary: 200 mg/dL — ABNORMAL HIGH (ref 70–99)
Glucose-Capillary: 267 mg/dL — ABNORMAL HIGH (ref 70–99)
Glucose-Capillary: 284 mg/dL — ABNORMAL HIGH (ref 70–99)

## 2012-01-20 LAB — HEMOGLOBIN AND HEMATOCRIT, BLOOD: HCT: 33 % — ABNORMAL LOW (ref 36.0–46.0)

## 2012-01-20 SURGERY — COLONOSCOPY
Anesthesia: Moderate Sedation

## 2012-01-20 MED ORDER — STERILE WATER FOR IRRIGATION IR SOLN
Status: DC | PRN
  Administered 2012-01-20: 16:00:00

## 2012-01-20 MED ORDER — MAGNESIUM CITRATE PO SOLN
1.0000 | Freq: Once | ORAL | Status: AC
  Administered 2012-01-20: 1 via ORAL
  Filled 2012-01-20: qty 296

## 2012-01-20 MED ORDER — MEPERIDINE HCL 50 MG/ML IJ SOLN
INTRAMUSCULAR | Status: DC | PRN
  Administered 2012-01-20: 15 mg via INTRAVENOUS
  Administered 2012-01-20: 25 mg via INTRAVENOUS
  Administered 2012-01-20: 10 mg via INTRAVENOUS

## 2012-01-20 MED ORDER — AMLODIPINE BESYLATE 5 MG PO TABS
5.0000 mg | ORAL_TABLET | Freq: Every day | ORAL | Status: DC
  Administered 2012-01-20: 5 mg via ORAL
  Filled 2012-01-20: qty 1

## 2012-01-20 MED ORDER — MIDAZOLAM HCL 5 MG/5ML IJ SOLN
INTRAMUSCULAR | Status: AC
  Filled 2012-01-20: qty 10

## 2012-01-20 MED ORDER — MEPERIDINE HCL 50 MG/ML IJ SOLN
INTRAMUSCULAR | Status: AC
  Filled 2012-01-20: qty 1

## 2012-01-20 MED ORDER — EZETIMIBE 10 MG PO TABS
10.0000 mg | ORAL_TABLET | Freq: Every day | ORAL | Status: DC
  Administered 2012-01-20: 10 mg via ORAL

## 2012-01-20 MED ORDER — SODIUM CHLORIDE 0.9 % IV SOLN: INTRAVENOUS | Status: DC

## 2012-01-20 MED ORDER — INSULIN GLARGINE 100 UNIT/ML ~~LOC~~ SOLN
20.0000 [IU] | Freq: Two times a day (BID) | SUBCUTANEOUS | Status: DC
  Administered 2012-01-20: 20 [IU] via SUBCUTANEOUS

## 2012-01-20 MED ORDER — MIDAZOLAM HCL 5 MG/5ML IJ SOLN
INTRAMUSCULAR | Status: DC | PRN
  Administered 2012-01-20: 1 mg via INTRAVENOUS
  Administered 2012-01-20: 2 mg via INTRAVENOUS
  Administered 2012-01-20 (×2): 1 mg via INTRAVENOUS

## 2012-01-20 NOTE — Op Note (Signed)
COLONOSCOPY PROCEDURE REPORT  PATIENT:  [name]  MR#:  [id number] Birthdate:  Mar 20, 1930, 78 y.o., female Endoscopist:  Dr. Malissa Hippo, MD Procedure Date: 01/20/2012  Procedure:   Colonoscopy  Indications: Patient is 78 year old Caucasian female with multiple medical problems who presents with large volume hematochezia. She is on warfarin for atrial fibrillation. She was given 2 mg of vitamin K subcutaneous and INR this morning was 1.63. She is undergoing diagnostic colonoscopy. Patient's last colonoscopy was in July 2008.  Informed Consent:  The procedure and risks were reviewed with the patient and informed consent was obtained.  Medications:  Demerol 50 mg IV Versed 5 mg IV  Description of procedure:  After a digital rectal exam was performed, that colonoscope was advanced from the anus through the rectum and colon to the area of the cecum, ileocecal valve and appendiceal orifice. The cecum was deeply intubated. These structures were well-seen and photographed for the record. From the level of the cecum and ileocecal valve, the scope was slowly and cautiously withdrawn. The mucosal surfaces were carefully surveyed utilizing scope tip to flexion to facilitate fold flattening as needed. The scope was pulled down into the rectum where a thorough exam including retroflexion was performed.  Findings:   Prep suboptimal with pieces of formed stool as well as burgundy blood. Scattered diverticula throughout the colon but mostly at sigmoid and ascending colon without stigmata of bleed. Normal rectal mucosa and ano-rectal junction.  Therapeutic/Diagnostic Maneuvers Performed:  None   Complications:  None  Cecal Withdrawal Time:  9 minutes  Impression:  Examination performed to cecum. Scattered diverticula throughout the colon but predominantly at sigmoid and descending colon. Quality of exam somewhat compromised due to  Sub-optimal prep as patient declined to drink  GoLYTELY. Suspect colonic diverticula bleed.  Recommendations:  Full liquids. H&H in a.m. Continue to hold anticoagulant.  [name]  01/20/2012 4:27 PM  CC: Dr. Dwana Melena, MD & Dr. Bonnetta Barry ref. provider found

## 2012-01-20 NOTE — Progress Notes (Addendum)
Chart reviewed.  Discussed with Dr. Karilyn Cota.  Seen in endoscopy.  Subjective: Continued to have bloody effluent with bowel prep. No other complaints.  Objective: Vital signs in last 24 hours: Filed Vitals:   01/19/12 2154 01/20/12 0610 01/20/12 1356 01/20/12 1423  BP: 149/75 149/76 130/84 136/67  Pulse: 104 88 81 88  Temp: 97.8 F (36.6 C) 97.6 F (36.4 C) 98.2 F (36.8 C) 97.9 F (36.6 C)  TempSrc: Oral Oral Oral Oral  Resp: 20 20 18 16   Height:      Weight:      SpO2: 95% 92% 95% 95%   Weight change:   Intake/Output Summary (Last 24 hours) at 01/20/12 1607 Last data filed at 01/20/12 0643  Gross per 24 hour  Intake 1658.75 ml  Output    300 ml  Net 1358.75 ml   General: Comfortable. Alert. Lungs clear to auscultation bilaterally without wheeze rhonchi or rales Cardiovascular regular rate rhythm without murmurs gallops rubs Abdomen soft nontender nondistended Extremities no clubbing cyanosis or edema  Lab Results: Basic Metabolic Panel:  Lab 01/20/12 4098 01/19/12 0913  NA 139 138  K 3.7 3.7  CL 103 100  CO2 24 26  GLUCOSE 203* 204*  BUN 8 14  CREATININE 0.57 0.61  CALCIUM 9.3 10.1  MG -- --  PHOS -- --   Liver Function Tests:  Lab 01/20/12 0531 01/19/12 0913  AST 16 19  ALT 9 12  ALKPHOS 75 97  BILITOT 0.5 0.4  PROT 6.4 7.7  ALBUMIN 3.3* 3.9   No results found for this basename: LIPASE:2,AMYLASE:2 in the last 168 hours No results found for this basename: AMMONIA:2 in the last 168 hours CBC:  Lab 01/20/12 1132 01/20/12 0819 01/19/12 0913 01/18/12 1642  WBC -- -- 19.7* 20.2*  NEUTROABS -- -- 12.3* 13.3*  HGB 10.5* 10.7* -- --  HCT -- 33.0* 34.2* --  MCV -- -- 93.7 97.0  PLT -- -- 500* 411.0*   Cardiac Enzymes: No results found for this basename: CKTOTAL:3,CKMB:3,CKMBINDEX:3,TROPONINI:3 in the last 168 hours BNP: No results found for this basename: PROBNP:3 in the last 168 hours D-Dimer: No results found for this basename: DDIMER:2 in the  last 168 hours CBG:  Lab 01/20/12 1109 01/20/12 0732 01/19/12 2159 01/19/12 1641 01/19/12 1233  GLUCAP 267* 200* 210* 179* 135*   Hemoglobin A1C: No results found for this basename: HGBA1C in the last 168 hours Fasting Lipid Panel: No results found for this basename: CHOL,HDL,LDLCALC,TRIG,CHOLHDL,LDLDIRECT in the last 119 hours Thyroid Function Tests: No results found for this basename: TSH,T4TOTAL,FREET4,T3FREE,THYROIDAB in the last 168 hours Coagulation:  Lab 01/20/12 0531 01/19/12 0913 01/18/12 0914  LABPROT 19.2* 22.9* --  INR 1.68* 2.13* 3.2   Scheduled Meds:   . amLODipine  5 mg Oral QHS  . colesevelam  625 mg Oral TID  . ezetimibe  10 mg Oral Q supper  . febuxostat  40 mg Oral Daily  . gabapentin  300 mg Oral TID  . irbesartan  300 mg Oral Daily  . magnesium citrate  1 Bottle Oral Once  . meperidine      . metoprolol tartrate  25 mg Oral BID  . midazolam      . pantoprazole  40 mg Oral QAC breakfast  . phytonadione  2 mg Subcutaneous Once  . polyethylene glycol  17 g Oral Daily  . sodium chloride  3 mL Intravenous Q12H  . DISCONTD: amLODipine  5 mg Oral Daily  . DISCONTD: ezetimibe  10 mg Oral Daily   Continuous Infusions:   . sodium chloride 75 mL (01/19/12 2348)   PRN Meds:.colchicine, meperidine, midazolam, nitroGLYCERIN, ondansetron (ZOFRAN) IV, ondansetron, polyethylene glycol Assessment/Plan: Principal Problem:  *Rectal bleeding Active Problems:  IDDM  HYPERTENSION  Chronic anticoagulation  CAD (coronary artery disease)  Atrial fibrillation  Await colonoscopy findings. Resume Lantus at a lower dose. Stable.   LOS: 1 day   Kealan Buchan L 01/20/2012, 4:07 PM

## 2012-01-20 NOTE — Progress Notes (Signed)
Inpatient Diabetes Program Recommendations  AACE/ADA: New Consensus Statement on Inpatient Glycemic Control (2013)  Target Ranges:  Prepandial:   less than 140 mg/dL      Peak postprandial:   less than 180 mg/dL (1-2 hours)      Critically ill patients:  140 - 180 mg/dL   Results for [name], [name] (MRN [id number]) as of 01/20/2012 09:44  Ref. Range 01/20/2012 07:32  Glucose-Capillary Latest Range: 70-99 mg/dL 409 (H)   Patient's home insulin regimen is as follows: Lantus 44 units in the AM and 26 units at bedtime Novolog 10 units with breakfast/ 10 units with lunch/ 12 units with supper  Inpatient Diabetes Program Recommendations Insulin - Basal: Please consider starting 1/2 patient's home dose Lantus if CBGs continue to rise- Lantus 20 units in the AM and Lantus 13 units at bedtime. Correction (SSI): Please start Novolog Sensitive correction scale tid ac + HS.  Note: Will follow. Ambrose Finland RN, MSN, CDE Diabetes Coordinator Inpatient Diabetes Program [phone]

## 2012-01-20 NOTE — Care Management Note (Signed)
    Page 1 of 1   01/21/2012     11:44:56 AM   CARE MANAGEMENT NOTE 01/21/2012  Patient:  [name], [name]   Account Number:  [id number]  Date Initiated:  01/20/2012  Documentation initiated by:  Rosemary Holms  Subjective/Objective Assessment:   Pt admitted  from home where she lives at home and has a caregiver. Pt admitted with rectal bleeding.     Action/Plan:   plan to DC home . No anticipated or identified HH needs.   Anticipated DC Date:  01/21/2012   Anticipated DC Plan:  HOME/SELF CARE      DC Planning Services  CM consult      Choice offered to / List presented to:             Status of service:  Completed, signed off Medicare Important Message given?   (If response is "NO", the following Medicare IM given date fields will be blank) Date Medicare IM given:   Date Additional Medicare IM given:    Discharge Disposition:  HOME/SELF CARE  Per UR Regulation:    If discussed at Long Length of Stay Meetings, dates discussed:    Comments:  01/20/12 1100 Hubert Raatz Leanord Hawking RN BSN CM

## 2012-01-20 NOTE — Progress Notes (Signed)
Patient had one bowel movement since seen yesterday afternoon. She passed dark blood. She denies abdominal pain. H&H from this morning is pending. INR down to 1.63. Patient is agreeable to proceed with colonoscopy. Prep her with mag citrate. colonoscopy this afternoon

## 2012-01-21 DIAGNOSIS — K5731 Diverticulosis of large intestine without perforation or abscess with bleeding: Principal | ICD-10-CM | POA: Diagnosis present

## 2012-01-21 LAB — HEMOGLOBIN AND HEMATOCRIT, BLOOD
HCT: 31.9 % — ABNORMAL LOW (ref 36.0–46.0)
Hemoglobin: 10.4 g/dL — ABNORMAL LOW (ref 12.0–15.0)

## 2012-01-21 LAB — GLUCOSE, CAPILLARY: Glucose-Capillary: 265 mg/dL — ABNORMAL HIGH (ref 70–99)

## 2012-01-21 NOTE — Progress Notes (Signed)
Discharge instructions given, verbalized understanding, out via w/c in stable condition with family.

## 2012-01-21 NOTE — Progress Notes (Signed)
Patient agitated at this time, oriented x 3, disoriented to situation post procedure, family at bedside, patient refuses to keep telemetry on at this time.

## 2012-01-21 NOTE — Discharge Summary (Addendum)
Physician Discharge Summary  Patient ID: [name] MRN: [id number] DOB/AGE: [birth year] 78 y.o.  Admit date: 01/19/2012 Discharge date: 01/21/2012  Discharge Diagnoses:  Principal Problem:  *Rectal bleeding Active Problems:  Diverticulosis of colon with hemorrhage  IDDM  HYPERTENSION  Atrial fibrillation  HYPERCHOLESTEROLEMIA  IIA  Gout, unspecified  DIASTOLIC DYSFUNCTION  Chronic anticoagulation  CAD (coronary artery disease)  GERD (gastroesophageal reflux disease)     Medication List     As of 01/21/2012  8:50 AM    STOP taking these medications         aspirin 81 MG EC tablet      warfarin 2.5 MG tablet   Commonly known as: COUMADIN      TAKE these medications         amLODipine 10 MG tablet   Commonly known as: NORVASC   Take 0.5 tablets (5 mg total) by mouth daily.      colchicine 0.6 MG tablet   Take 0.6 mg by mouth daily as needed. Gout Flare      colesevelam 625 MG tablet   Commonly known as: WELCHOL   Take 625 mg by mouth 3 (three) times daily.      ezetimibe 10 MG tablet   Commonly known as: ZETIA   Take 1 tablet (10 mg total) by mouth daily.      febuxostat 40 MG tablet   Commonly known as: ULORIC   Take 40 mg by mouth daily.      insulin aspart 100 UNIT/ML injection   Commonly known as: novoLOG   Inject 10-12 Units into the skin 3 (three) times daily as needed. FlexPen - Inject into skin subcutaneously. 10 units with breakfast, 10 units with lunch, and 12 units with supper. If sugar is running high patient may take an additional 2-4 units.      insulin glargine 100 UNIT/ML injection   Commonly known as: LANTUS   Solostar Pen - Inject into skin subcutaneously. 44 units daily in AM and 26 units at bedtime.      metoprolol tartrate 25 MG tablet   Commonly known as: LOPRESSOR   Take 25 mg by mouth 2 (two) times daily.      NEURONTIN 300 MG capsule   Generic drug: gabapentin   Take 300 mg by mouth 3 (three) times daily.     nitroGLYCERIN 0.4 MG SL tablet   Commonly known as: NITROSTAT   Place 1 tablet (0.4 mg total) under the tongue every 5 (five) minutes x 3 doses as needed for chest pain.      olmesartan 40 MG tablet   Commonly known as: BENICAR   Take 40 mg by mouth daily.      ondansetron 4 MG tablet   Commonly known as: ZOFRAN   Take 1 tablet (4 mg total) by mouth every 8 (eight) hours as needed for nausea.      pantoprazole 20 MG tablet   Commonly known as: PROTONIX   Take 20 mg by mouth daily.      polyethylene glycol packet   Commonly known as: MIRALAX / GLYCOLAX   Take 17 g by mouth daily.      Vitamin D3 50000 UNITS Caps   Take 50,000 Units by mouth every 7 (seven) days. TAKES ON THURSDAYS            Discharge Orders    Future Appointments: Provider: Department: Dept Phone: Center:   02/01/2012 2:30 PM [facility] [phone] [id number]  02/03/2012 2:15 PM Herby Abraham, MD [facility] [phone] [id number]   02/29/2012 11:45 AM Rachael Fee [facility] [phone] None   02/29/2012 12:15 PM Josph Macho, MD [facility] [phone] None   02/29/2012 2:00 PM Malissa Hippo, MD [facility] [phone] None     Future Orders Please Complete By Expires   Diet - low sodium heart healthy      Diet Carb Modified      Activity as tolerated - No restrictions         Follow-up Information    Follow up with Shawnie Pons, MD. In 1 week.   Contact information:   [address] [place] [address] [phone]       Follow up with Malissa Hippo, MD. In 2 weeks.   Contact information:   [address], [address] [place] [address] [phone]          Disposition: home  Discharged Condition: stable  Consults: Treatment Team:  Malissa Hippo, MD  Labs:   Results for orders placed during the hospital encounter of 01/19/12 (from the past 48 hour(s))   OCCULT BLOOD, POC DEVICE     Status: Normal   Collection Time   01/19/12  9:08 AM      Component Value Range Comment   Fecal Occult Bld POSITIVE     CBC WITH DIFFERENTIAL     Status: Abnormal   Collection Time   01/19/12  9:13 AM      Component Value Range Comment   WBC 19.7 (*) 4.0 - 10.5 K/uL    RBC 3.65 (*) 3.87 - 5.11 MIL/uL    Hemoglobin 11.2 (*) 12.0 - 15.0 g/dL    HCT 38.7 (*) 56.4 - 46.0 %    MCV 93.7  78.0 - 100.0 fL    MCH 30.7  26.0 - 34.0 pg    MCHC 32.7  30.0 - 36.0 g/dL    RDW 33.2  95.1 - 88.4 %    Platelets 500 (*) 150 - 400 K/uL    Neutrophils Relative 62  43 - 77 %    Neutro Abs 12.3 (*) 1.7 - 7.7 K/uL    Lymphocytes Relative 29  12 - 46 %    Lymphs Abs 5.8 (*) 0.7 - 4.0 K/uL    Monocytes Relative 6  3 - 12 %    Monocytes Absolute 1.2 (*) 0.1 - 1.0 K/uL    Eosinophils Relative 2  0 - 5 %    Eosinophils Absolute 0.4  0.0 - 0.7 K/uL    Basophils Relative 0  0 - 1 %    Basophils Absolute 0.1  0.0 - 0.1 K/uL   COMPREHENSIVE METABOLIC PANEL     Status: Abnormal   Collection Time   01/19/12  9:13 AM      Component Value Range Comment   Sodium 138  135 - 145 mEq/L    Potassium 3.7  3.5 - 5.1 mEq/L    Chloride 100  96 - 112 mEq/L    CO2 26  19 - 32 mEq/L    Glucose, Bld 204 (*) 70 - 99 mg/dL    BUN 14  6 - 23 mg/dL    Creatinine, Ser 1.66  0.50 - 1.10 mg/dL    Calcium 06.3  8.4 - 10.5 mg/dL    Total Protein 7.7  6.0 - 8.3 g/dL    Albumin 3.9  3.5 - 5.2 g/dL    AST 19  0 - 37 U/L    ALT 12  0 - 35 U/L    Alkaline Phosphatase 97  39 - 117 U/L    Total Bilirubin 0.4  0.3 - 1.2 mg/dL    GFR calc non Af Amer 83 (*) >90 mL/min    GFR calc Af Amer >90  >90 mL/min   APTT     Status: Abnormal   Collection Time   01/19/12  9:13 AM      Component Value Range Comment   aPTT 40 (*) 24 - 37 seconds   PROTIME-INR     Status: Abnormal   Collection Time   01/19/12  9:13 AM      Component Value Range Comment   Prothrombin Time 22.9 (*) 11.6 - 15.2 seconds    INR 2.13  (*) 0.00 - 1.49   SAMPLE TO BLOOD BANK     Status: Normal   Collection Time   01/19/12  9:14 AM      Component Value Range Comment   Blood Bank Specimen SAMPLE AVAILABLE FOR TESTING      Sample Expiration 01/22/2012     HEMOGLOBIN     Status: Abnormal   Collection Time   01/19/12 11:30 AM      Component Value Range Comment   Hemoglobin 11.6 (*) 12.0 - 15.0 g/dL   GLUCOSE, CAPILLARY     Status: Abnormal   Collection Time   01/19/12 12:33 PM      Component Value Range Comment   Glucose-Capillary 135 (*) 70 - 99 mg/dL   GLUCOSE, CAPILLARY     Status: Abnormal   Collection Time   01/19/12  4:41 PM      Component Value Range Comment   Glucose-Capillary 179 (*) 70 - 99 mg/dL    Comment 1 Notify RN     GLUCOSE, CAPILLARY     Status: Abnormal   Collection Time   01/19/12  9:59 PM      Component Value Range Comment   Glucose-Capillary 210 (*) 70 - 99 mg/dL   HEMOGLOBIN     Status: Abnormal   Collection Time   01/19/12 11:08 PM      Component Value Range Comment   Hemoglobin 10.0 (*) 12.0 - 15.0 g/dL   COMPREHENSIVE METABOLIC PANEL     Status: Abnormal   Collection Time   01/20/12  5:31 AM      Component Value Range Comment   Sodium 139  135 - 145 mEq/L    Potassium 3.7  3.5 - 5.1 mEq/L    Chloride 103  96 - 112 mEq/L    CO2 24  19 - 32 mEq/L    Glucose, Bld 203 (*) 70 - 99 mg/dL    BUN 8  6 - 23 mg/dL    Creatinine, Ser 1.61  0.50 - 1.10 mg/dL    Calcium 9.3  8.4 - 09.6 mg/dL    Total Protein 6.4  6.0 - 8.3 g/dL    Albumin 3.3 (*) 3.5 - 5.2 g/dL    AST 16  0 - 37 U/L    ALT 9  0 - 35 U/L    Alkaline Phosphatase 75  39 - 117 U/L    Total Bilirubin 0.5  0.3 - 1.2 mg/dL    GFR calc non Af Amer 85 (*) >90 mL/min    GFR calc Af Amer >90  >90 mL/min   PROTIME-INR  Status: Abnormal   Collection Time   01/20/12  5:31 AM      Component Value Range Comment   Prothrombin Time 19.2 (*) 11.6 - 15.2 seconds    INR 1.68 (*) 0.00 - 1.49   GLUCOSE, CAPILLARY     Status: Abnormal    Collection Time   01/20/12  7:32 AM      Component Value Range Comment   Glucose-Capillary 200 (*) 70 - 99 mg/dL    Comment 1 Notify RN      Comment 2 Documented in Chart     HEMOGLOBIN AND HEMATOCRIT, BLOOD     Status: Abnormal   Collection Time   01/20/12  8:19 AM      Component Value Range Comment   Hemoglobin 10.7 (*) 12.0 - 15.0 g/dL    HCT 45.4 (*) 09.8 - 46.0 %   GLUCOSE, CAPILLARY     Status: Abnormal   Collection Time   01/20/12 11:09 AM      Component Value Range Comment   Glucose-Capillary 267 (*) 70 - 99 mg/dL    Comment 1 Notify RN      Comment 2 Documented in Chart     HEMOGLOBIN     Status: Abnormal   Collection Time   01/20/12 11:32 AM      Component Value Range Comment   Hemoglobin 10.5 (*) 12.0 - 15.0 g/dL   GLUCOSE, CAPILLARY     Status: Abnormal   Collection Time   01/20/12  9:02 PM      Component Value Range Comment   Glucose-Capillary 284 (*) 70 - 99 mg/dL    Comment 1 Documented in Chart      Comment 2 Notify RN     HEMOGLOBIN     Status: Abnormal   Collection Time   01/20/12 11:20 PM      Component Value Range Comment   Hemoglobin 9.7 (*) 12.0 - 15.0 g/dL   HEMOGLOBIN AND HEMATOCRIT, BLOOD     Status: Abnormal   Collection Time   01/21/12  5:41 AM      Component Value Range Comment   Hemoglobin 10.4 (*) 12.0 - 15.0 g/dL    HCT 11.9 (*) 14.7 - 46.0 %   PROTIME-INR     Status: Abnormal   Collection Time   01/21/12  5:41 AM      Component Value Range Comment   Prothrombin Time 16.3 (*) 11.6 - 15.2 seconds    INR 1.34  0.00 - 1.49   MAGNESIUM     Status: Abnormal   Collection Time   01/21/12  5:41 AM      Component Value Range Comment   Magnesium 1.3 (*) 1.5 - 2.5 mg/dL   GLUCOSE, CAPILLARY     Status: Abnormal   Collection Time   01/21/12  7:51 AM      Component Value Range Comment   Glucose-Capillary 265 (*) 70 - 99 mg/dL    Comment 1 Documented in Chart      Comment 2 Notify RN       Diagnostics:  Nm Gastric Emptying  01/01/2012   *RADIOLOGY REPORT*  Clinical Data:  Abdominal pain and nausea.  NUCLEAR MEDICINE GASTRIC EMPTYING SCAN  Technique:  After oral ingestion of radiolabeled meal, sequential abdominal images were obtained for 120 minutes.  Residual percentage of activity remaining within the stomach was calculated at 120 minutes.  Radiopharmaceutical: 2.1 mCi Tc-62m sulfur colloid.  Comparison: None.  Findings: 3% of the ingested  activity remained in the stomach at two hours.  The normal two hour gastric retention is less than 30%.  IMPRESSION: Normal examination.   Original Report Authenticated By: Darrol Angel, M.D.     Procedures: colonoscopy showed scattered diverticula throughout the colon but predominantly at sigmoid and descending colon, somewhat compromised prep.  Full Code   Hospital Course: See H&P for complete admission details. Ms. Alatorre is an 78 year old white female with rectal bleeding. She had no abdominal pain. She is on aspirin and Coumadin for chronic atrial fibrillation. On exam, she had normal vital signs. She was in no distress. Her abdomen was soft and nontender. White blood cell count was 19,000. Hemoglobin was 11. Hematocrit 34. Platelet count 500. Patient's Coumadin was stopped. She was given vitamin K. GI was consulted. She underwent colonoscopy. There was some burgundy blood but no ongoing bleeding noted on colonoscopy. She had no fever or other signs of infection. Hemoglobin at its lowest was 10.4. She did not require transfusion. Dr. Dionicia Abler has discussed the case with Dr. Riley Kill, patient's cardiologist. Dr. Riley Kill recommends stopping anticoagulation and will followup in the office in one to 2 weeks. Patient feels well and is anxious for discharge  Discharge Exam:  Blood pressure 124/80, pulse 81, temperature 97.3 F (36.3 C), temperature source Oral, resp. rate 20, height 5\' 4"  (1.626 m), weight 73.573 kg (162 lb 3.2 oz), SpO2 93.00%.  Patient is fully dressed in street close. Abdomen soft  nontender. Lungs clear to auscultation bilaterally. Extremities no edema.  SignedChristiane Ha 01/21/2012, 8:50 AM

## 2012-01-21 NOTE — Progress Notes (Signed)
Patient ID: [name], female   DOB: 02-19-1930, 78 y.o.   MRN: [id number] Feels good. No BM today. Colonoscopy result revealed probably diverticular bleed. Patient is requesting to go home. Hemoglobin stable 10.4. Needs OV in 2 weeks.

## 2012-01-21 NOTE — Progress Notes (Addendum)
Patient oriented x4, less agitated and more cooperative, family remains at this time, patient continues to refuse to wear telemetry at this time.

## 2012-01-24 NOTE — Assessment & Plan Note (Signed)
She has a high CHADS score, and is at very high risk for thromboembolic events.  However, she is being monitored in our [facility], and we will check her CBC today.

## 2012-01-24 NOTE — Progress Notes (Signed)
HPI:  [name] is in for follow up.  She is getting things perhaps more organized, but still upset about confusion in the hospital.  Is on anticoagulation, and we have been concerned about this, and yet her CHADS score is exceedingly high.  She does not feel all that well but does not have specific complaints.  She does have some intermittent abdominal discomfort but no chest pain.     Allergies  Allergen Reactions  . Codeine Nausea And Vomiting  . Ivp Dye (Iodinated Diagnostic Agents) Other (See Comments)    Burst veins in her arm.   . Morphine Other (See Comments)    Not in Right State of Mind.   . Prednisone Other (See Comments)    Patient became delirious/combative when given 60mg  dose  . Statins Other (See Comments)    Leg Cramps.     Past Medical History  Diagnosis Date  . HTN (hypertension)   . Coronary atherosclerosis of unspecified type of vessel, native or graft     a. remote CABG in 1999 with Dr. Laneta Yu. b. s/p PCI with stent to LCX in 2009 (Platinum protocol study). c. NSTEMI 10/2011 s/p BMS to SVG->diagonal.  . Personal history of other diseases of digestive system   . Gout, unspecified     "have had it in my hands and my feet"  . Unspecified hereditary and idiopathic peripheral neuropathy   . Esophageal reflux   . Anemia   . GI bleed   . Leukocytosis 03/16/2011  . Hyperlipidemia     Statin intolerant  . Pancreas disorder     prior pancreatectomy in 2011 complicated by leak requiring prolong and repeated drainage and subsequent pancreatic stent  . Other chronic pulmonary heart diseases   . Type I (juvenile type) diabetes mellitus without mention of complication, not stated as uncontrolled     Previously on insulin pump  . Permanent atrial fibrillation   . Anginal pain 11/24/11    "1st time"  . Exertional dyspnea 11/24/11  . Depression   . 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 CLL (chronic lymphocytic leukemia)     seeing Dr. Myna Yu  .  Myocardial infarction 1999    "on the verge of one"  . NSTEMI (non-ST elevated myocardial infarction) 10/2011    /H&P    Past Surgical History  Procedure Date  . Oophorectomy 1970's    w hysterectomy and bladder tacking procedure  . Belpharoptosis repair     right upper eyelid  . Esophagogastroduodenoscopy 07/11/2010  . Colonoscopy 10/20/06  . Tonsillectomy 1947  . Appendectomy 1970's?  . Abdominal hysterectomy 1970's  . Coronary angioplasty with stent placement 10/2011    "makes total of 4"; 20-30% ostial left main stenosis; occluded ostial LAD supplied by IMA graft, 20-30% scattered LCx stenoses with widely patent stent and 20% narrowing; diffuse RCA luminal irregularities up to 20-30%; SVG-diagonal patent, but proximal 90% stenosis s/p BMS; patent LIMA-LAD; LVEF 55-65%.  . Coronary angioplasty with stent placement 2009    LCX  . Cataract extraction w/ intraocular lens  implant, bilateral ~ 2003  . Coronary artery bypass graft 1999    LIMA to LAD, SVG to the DX  . Pancreatectomy 2011    c/b leak requiring multiple drainage attempts and stent placement), /H&P  . Cardiac surgery     Family History  Problem Relation Age of Onset  . Stroke Mother   . Lung cancer Father   . Healthy Son  History   Social History  . Marital Status: Widowed    Spouse Name: N/A    Number of Children: N/A  . Years of Education: N/A   Occupational History  . Not on file.   Social History Main Topics  . Smoking status: Never Smoker   . Smokeless tobacco: Never Used  . Alcohol Use: No  . Drug Use: No  . Sexually Active: No   Other Topics Concern  . Not on file   Social History Narrative   Worked at [employer] for 41 years. Has a son who lives in [place].     ROS: Please see the HPI.  All other systems reviewed and negative.  PHYSICAL EXAM:  BP 128/62  Pulse 84  Ht 5\' 4"  (1.626 m)  Wt 164 lb 6.4 oz (74.571 kg)  BMI 28.22 kg/m2  SpO2 94%  General: Well developed, well  nourished, in no acute distress. Head:  Normocephalic and atraumatic. Neck: no JVD Lungs: Clear to auscultation and percussion. Heart: irregularly irregular rhythm.  Pulses: Pulses normal in all 4 extremities. Abdomen:  No masses or tenderness Extremities: No clubbing or cyanosis. No edema. Neurologic: Alert and oriented x 3.  EKG:  Atrial fib with controlled ventricular response.    ASSESSMENT AND PLAN:  1.  Coronary artery disease   --  She remains stable at the present time.  No current chest pain.  2.  Atrial fibrillation   --- Dr. Karilyn Yu and I had discussed previously in light of GI bleeding.  With her very high CHADS score she remains at very high risk for thromboembolic events.  See my note.  She will be followed closely in the warfarin clinic in [place], and our goal is to continue if she tolerates. 3.  DM  - under the care of Dr. Lucianne Yu.    Early follow up is planned.

## 2012-01-26 ENCOUNTER — Encounter (HOSPITAL_COMMUNITY): Payer: Self-pay | Admitting: Internal Medicine

## 2012-01-27 ENCOUNTER — Encounter (HOSPITAL_COMMUNITY): Payer: Self-pay | Admitting: Emergency Medicine

## 2012-01-27 ENCOUNTER — Emergency Department (HOSPITAL_COMMUNITY): Payer: Medicare Other

## 2012-01-27 ENCOUNTER — Inpatient Hospital Stay (HOSPITAL_COMMUNITY)
Admission: EM | Admit: 2012-01-27 | Discharge: 2012-02-02 | DRG: 064 | Disposition: A | Discharge status: Skilled Nursing Facility | Payer: Medicare Other | Source: Ambulatory Visit | Attending: Internal Medicine | Admitting: Internal Medicine

## 2012-01-27 DIAGNOSIS — Z90710 Acquired absence of both cervix and uterus: Secondary | ICD-10-CM

## 2012-01-27 DIAGNOSIS — Z794 Long term (current) use of insulin: Secondary | ICD-10-CM

## 2012-01-27 DIAGNOSIS — C9110 Chronic lymphocytic leukemia of B-cell type not having achieved remission: Secondary | ICD-10-CM | POA: Diagnosis present

## 2012-01-27 DIAGNOSIS — Z9089 Acquired absence of other organs: Secondary | ICD-10-CM

## 2012-01-27 DIAGNOSIS — Z7901 Long term (current) use of anticoagulants: Secondary | ICD-10-CM

## 2012-01-27 DIAGNOSIS — I639 Cerebral infarction, unspecified: Secondary | ICD-10-CM

## 2012-01-27 DIAGNOSIS — I209 Angina pectoris, unspecified: Secondary | ICD-10-CM

## 2012-01-27 DIAGNOSIS — Z9861 Coronary angioplasty status: Secondary | ICD-10-CM

## 2012-01-27 DIAGNOSIS — Z951 Presence of aortocoronary bypass graft: Secondary | ICD-10-CM

## 2012-01-27 DIAGNOSIS — R5381 Other malaise: Secondary | ICD-10-CM | POA: Diagnosis present

## 2012-01-27 DIAGNOSIS — R1013 Epigastric pain: Secondary | ICD-10-CM | POA: Diagnosis present

## 2012-01-27 DIAGNOSIS — R1030 Lower abdominal pain, unspecified: Secondary | ICD-10-CM

## 2012-01-27 DIAGNOSIS — Z66 Do not resuscitate: Secondary | ICD-10-CM | POA: Diagnosis not present

## 2012-01-27 DIAGNOSIS — R112 Nausea with vomiting, unspecified: Secondary | ICD-10-CM | POA: Diagnosis present

## 2012-01-27 DIAGNOSIS — I214 Non-ST elevation (NSTEMI) myocardial infarction: Secondary | ICD-10-CM | POA: Diagnosis present

## 2012-01-27 DIAGNOSIS — D72829 Elevated white blood cell count, unspecified: Secondary | ICD-10-CM

## 2012-01-27 DIAGNOSIS — J690 Pneumonitis due to inhalation of food and vomit: Secondary | ICD-10-CM | POA: Diagnosis present

## 2012-01-27 DIAGNOSIS — E785 Hyperlipidemia, unspecified: Secondary | ICD-10-CM

## 2012-01-27 DIAGNOSIS — I4821 Permanent atrial fibrillation: Secondary | ICD-10-CM

## 2012-01-27 DIAGNOSIS — R55 Syncope and collapse: Secondary | ICD-10-CM | POA: Diagnosis present

## 2012-01-27 DIAGNOSIS — K5731 Diverticulosis of large intestine without perforation or abscess with bleeding: Secondary | ICD-10-CM

## 2012-01-27 DIAGNOSIS — R110 Nausea: Secondary | ICD-10-CM

## 2012-01-27 DIAGNOSIS — M109 Gout, unspecified: Secondary | ICD-10-CM | POA: Diagnosis present

## 2012-01-27 DIAGNOSIS — I10 Essential (primary) hypertension: Secondary | ICD-10-CM | POA: Diagnosis present

## 2012-01-27 DIAGNOSIS — E7800 Pure hypercholesterolemia, unspecified: Secondary | ICD-10-CM | POA: Diagnosis present

## 2012-01-27 DIAGNOSIS — Z8719 Personal history of other diseases of the digestive system: Secondary | ICD-10-CM

## 2012-01-27 DIAGNOSIS — I2510 Atherosclerotic heart disease of native coronary artery without angina pectoris: Secondary | ICD-10-CM

## 2012-01-27 DIAGNOSIS — Z9079 Acquired absence of other genital organ(s): Secondary | ICD-10-CM

## 2012-01-27 DIAGNOSIS — E860 Dehydration: Secondary | ICD-10-CM | POA: Diagnosis present

## 2012-01-27 DIAGNOSIS — G609 Hereditary and idiopathic neuropathy, unspecified: Secondary | ICD-10-CM | POA: Diagnosis present

## 2012-01-27 DIAGNOSIS — E109 Type 1 diabetes mellitus without complications: Secondary | ICD-10-CM | POA: Diagnosis present

## 2012-01-27 DIAGNOSIS — I2789 Other specified pulmonary heart diseases: Secondary | ICD-10-CM

## 2012-01-27 DIAGNOSIS — I6350 Cerebral infarction due to unspecified occlusion or stenosis of unspecified cerebral artery: Principal | ICD-10-CM | POA: Diagnosis present

## 2012-01-27 DIAGNOSIS — R404 Transient alteration of awareness: Secondary | ICD-10-CM | POA: Diagnosis present

## 2012-01-27 DIAGNOSIS — D649 Anemia, unspecified: Secondary | ICD-10-CM | POA: Diagnosis present

## 2012-01-27 DIAGNOSIS — I803 Phlebitis and thrombophlebitis of lower extremities, unspecified: Secondary | ICD-10-CM

## 2012-01-27 DIAGNOSIS — Z859 Personal history of malignant neoplasm, unspecified: Secondary | ICD-10-CM

## 2012-01-27 DIAGNOSIS — K863 Pseudocyst of pancreas: Secondary | ICD-10-CM

## 2012-01-27 DIAGNOSIS — I4891 Unspecified atrial fibrillation: Secondary | ICD-10-CM | POA: Diagnosis present

## 2012-01-27 DIAGNOSIS — D509 Iron deficiency anemia, unspecified: Secondary | ICD-10-CM

## 2012-01-27 DIAGNOSIS — Y92009 Unspecified place in unspecified non-institutional (private) residence as the place of occurrence of the external cause: Secondary | ICD-10-CM

## 2012-01-27 DIAGNOSIS — Z888 Allergy status to other drugs, medicaments and biological substances status: Secondary | ICD-10-CM

## 2012-01-27 DIAGNOSIS — I252 Old myocardial infarction: Secondary | ICD-10-CM

## 2012-01-27 DIAGNOSIS — R748 Abnormal levels of other serum enzymes: Secondary | ICD-10-CM | POA: Diagnosis present

## 2012-01-27 DIAGNOSIS — C7A8 Other malignant neuroendocrine tumors: Secondary | ICD-10-CM | POA: Diagnosis present

## 2012-01-27 DIAGNOSIS — Z91041 Radiographic dye allergy status: Secondary | ICD-10-CM

## 2012-01-27 DIAGNOSIS — F329 Major depressive disorder, single episode, unspecified: Secondary | ICD-10-CM | POA: Diagnosis present

## 2012-01-27 DIAGNOSIS — G608 Other hereditary and idiopathic neuropathies: Secondary | ICD-10-CM | POA: Diagnosis present

## 2012-01-27 DIAGNOSIS — K219 Gastro-esophageal reflux disease without esophagitis: Secondary | ICD-10-CM | POA: Diagnosis present

## 2012-01-27 DIAGNOSIS — R5383 Other fatigue: Secondary | ICD-10-CM | POA: Diagnosis present

## 2012-01-27 DIAGNOSIS — Z79899 Other long term (current) drug therapy: Secondary | ICD-10-CM

## 2012-01-27 DIAGNOSIS — Z885 Allergy status to narcotic agent status: Secondary | ICD-10-CM

## 2012-01-27 DIAGNOSIS — F3289 Other specified depressive episodes: Secondary | ICD-10-CM | POA: Diagnosis present

## 2012-01-27 DIAGNOSIS — I519 Heart disease, unspecified: Secondary | ICD-10-CM

## 2012-01-27 DIAGNOSIS — Z823 Family history of stroke: Secondary | ICD-10-CM

## 2012-01-27 DIAGNOSIS — K625 Hemorrhage of anus and rectum: Secondary | ICD-10-CM

## 2012-01-27 LAB — CBC WITH DIFFERENTIAL/PLATELET
Basophils Absolute: 0.1 10*3/uL (ref 0.0–0.1)
HCT: 30.5 % — ABNORMAL LOW (ref 36.0–46.0)
Hemoglobin: 9.9 g/dL — ABNORMAL LOW (ref 12.0–15.0)
Lymphocytes Relative: 27 % (ref 12–46)
Lymphs Abs: 4.6 10*3/uL — ABNORMAL HIGH (ref 0.7–4.0)
Monocytes Absolute: 1.1 10*3/uL — ABNORMAL HIGH (ref 0.1–1.0)
Monocytes Relative: 6 % (ref 3–12)
Neutro Abs: 11.1 10*3/uL — ABNORMAL HIGH (ref 1.7–7.7)
WBC: 17.1 10*3/uL — ABNORMAL HIGH (ref 4.0–10.5)

## 2012-01-27 LAB — COMPREHENSIVE METABOLIC PANEL
AST: 15 U/L (ref 0–37)
BUN: 19 mg/dL (ref 6–23)
CO2: 26 mEq/L (ref 19–32)
Chloride: 102 mEq/L (ref 96–112)
Creatinine, Ser: 0.66 mg/dL (ref 0.50–1.10)
GFR calc non Af Amer: 81 mL/min — ABNORMAL LOW (ref >90)
Total Bilirubin: 0.2 mg/dL — ABNORMAL LOW (ref 0.3–1.2)

## 2012-01-27 LAB — GLUCOSE, CAPILLARY
Glucose-Capillary: 264 mg/dL — ABNORMAL HIGH (ref 70–99)
Glucose-Capillary: 297 mg/dL — ABNORMAL HIGH (ref 70–99)

## 2012-01-27 LAB — TROPONIN I: Troponin I: 3.83 ng/mL (ref <0.30)

## 2012-01-27 LAB — TSH: TSH: 1.307 u[IU]/mL (ref 0.350–4.500)

## 2012-01-27 MED ORDER — LORAZEPAM 2 MG/ML IJ SOLN
0.5000 mg | Freq: Once | INTRAMUSCULAR | Status: AC
  Administered 2012-01-27: 0.5 mg via INTRAVENOUS
  Filled 2012-01-27: qty 1

## 2012-01-27 MED ORDER — SODIUM CHLORIDE 0.9 % IJ SOLN
3.0000 mL | Freq: Two times a day (BID) | INTRAMUSCULAR | Status: DC
  Administered 2012-01-27 – 2012-02-01 (×6): 3 mL via INTRAVENOUS
  Filled 2012-01-27 (×5): qty 3

## 2012-01-27 MED ORDER — IRBESARTAN 300 MG PO TABS
300.0000 mg | ORAL_TABLET | Freq: Every day | ORAL | Status: DC
  Administered 2012-01-29 – 2012-02-02 (×5): 300 mg via ORAL
  Filled 2012-01-27 (×5): qty 1

## 2012-01-27 MED ORDER — SODIUM CHLORIDE 0.9 % IV SOLN: INTRAVENOUS | Status: DC

## 2012-01-27 MED ORDER — ONDANSETRON HCL 4 MG/2ML IJ SOLN
4.0000 mg | Freq: Once | INTRAMUSCULAR | Status: AC
  Administered 2012-01-27: 4 mg via INTRAVENOUS
  Filled 2012-01-27: qty 2

## 2012-01-27 MED ORDER — ONDANSETRON HCL 4 MG PO TABS: 4.0000 mg | ORAL_TABLET | Freq: Three times a day (TID) | ORAL | Status: DC | PRN

## 2012-01-27 MED ORDER — HEPARIN SODIUM (PORCINE) 5000 UNIT/ML IJ SOLN
5000.0000 [IU] | Freq: Three times a day (TID) | INTRAMUSCULAR | Status: DC
  Administered 2012-01-27 – 2012-02-02 (×17): 5000 [IU] via SUBCUTANEOUS
  Filled 2012-01-27 (×17): qty 1

## 2012-01-27 MED ORDER — NITROGLYCERIN 0.4 MG SL SUBL: 0.4000 mg | SUBLINGUAL_TABLET | SUBLINGUAL | Status: DC | PRN

## 2012-01-27 MED ORDER — METOPROLOL TARTRATE 25 MG PO TABS
25.0000 mg | ORAL_TABLET | Freq: Two times a day (BID) | ORAL | Status: DC
  Administered 2012-01-27 – 2012-02-02 (×11): 25 mg via ORAL
  Filled 2012-01-27 (×11): qty 1

## 2012-01-27 MED ORDER — ASPIRIN 325 MG PO TABS
325.0000 mg | ORAL_TABLET | Freq: Every day | ORAL | Status: DC
  Filled 2012-01-27: qty 1

## 2012-01-27 MED ORDER — PANTOPRAZOLE SODIUM 40 MG IV SOLR
40.0000 mg | Freq: Once | INTRAVENOUS | Status: AC
  Administered 2012-01-27: 40 mg via INTRAVENOUS
  Filled 2012-01-27: qty 40

## 2012-01-27 MED ORDER — INSULIN ASPART 100 UNIT/ML ~~LOC~~ SOLN
0.0000 [IU] | Freq: Every day | SUBCUTANEOUS | Status: DC
  Administered 2012-01-27: 3 [IU] via SUBCUTANEOUS
  Administered 2012-01-30: 4 [IU] via SUBCUTANEOUS

## 2012-01-27 MED ORDER — SODIUM CHLORIDE 0.9 % IV BOLUS (SEPSIS)
500.0000 mL | Freq: Once | INTRAVENOUS | Status: AC
  Administered 2012-01-27: 16:00:00 via INTRAVENOUS

## 2012-01-27 MED ORDER — INSULIN ASPART 100 UNIT/ML ~~LOC~~ SOLN
0.0000 [IU] | Freq: Three times a day (TID) | SUBCUTANEOUS | Status: DC
  Administered 2012-01-28: 4 [IU] via SUBCUTANEOUS
  Administered 2012-01-28: 7 [IU] via SUBCUTANEOUS
  Administered 2012-01-28 – 2012-01-30 (×5): 4 [IU] via SUBCUTANEOUS
  Administered 2012-01-30 – 2012-01-31 (×4): 7 [IU] via SUBCUTANEOUS
  Administered 2012-01-31: 4 [IU] via SUBCUTANEOUS
  Administered 2012-02-01: 7 [IU] via SUBCUTANEOUS
  Administered 2012-02-01: 4 [IU] via SUBCUTANEOUS
  Administered 2012-02-01: 11 [IU] via SUBCUTANEOUS
  Administered 2012-02-02 (×2): 4 [IU] via SUBCUTANEOUS

## 2012-01-27 MED ORDER — POLYETHYLENE GLYCOL 3350 17 G PO PACK
17.0000 g | PACK | Freq: Every day | ORAL | Status: DC
  Administered 2012-01-29 – 2012-01-30 (×2): 17 g via ORAL
  Filled 2012-01-27 (×2): qty 1

## 2012-01-27 MED ORDER — BIOTENE DRY MOUTH MT LIQD
15.0000 mL | Freq: Two times a day (BID) | OROMUCOSAL | Status: DC
  Administered 2012-01-27 – 2012-02-02 (×10): 15 mL via OROMUCOSAL

## 2012-01-27 MED ORDER — EZETIMIBE 10 MG PO TABS
10.0000 mg | ORAL_TABLET | Freq: Every day | ORAL | Status: DC
  Administered 2012-01-30 – 2012-02-01 (×3): 10 mg via ORAL
  Filled 2012-01-27 (×5): qty 1

## 2012-01-27 MED ORDER — SODIUM CHLORIDE 0.9 % IV SOLN
INTRAVENOUS | Status: DC
  Administered 2012-01-27: 17:00:00 via INTRAVENOUS

## 2012-01-27 MED ORDER — PANTOPRAZOLE SODIUM 20 MG PO TBEC
20.0000 mg | DELAYED_RELEASE_TABLET | Freq: Every morning | ORAL | Status: DC
  Filled 2012-01-27 (×2): qty 1

## 2012-01-27 MED ORDER — AMLODIPINE BESYLATE 5 MG PO TABS
5.0000 mg | ORAL_TABLET | Freq: Every day | ORAL | Status: DC
  Administered 2012-01-27 – 2012-01-29 (×3): 5 mg via ORAL
  Filled 2012-01-27 (×3): qty 1

## 2012-01-27 MED ORDER — ONDANSETRON HCL 4 MG/2ML IJ SOLN
4.0000 mg | INTRAMUSCULAR | Status: DC | PRN
  Administered 2012-01-28 – 2012-02-01 (×3): 4 mg via INTRAVENOUS
  Filled 2012-01-27 (×5): qty 2

## 2012-01-27 MED ORDER — COLCHICINE 0.6 MG PO TABS: 0.6000 mg | ORAL_TABLET | Freq: Every day | ORAL | Status: DC | PRN

## 2012-01-27 MED ORDER — GABAPENTIN 300 MG PO CAPS
300.0000 mg | ORAL_CAPSULE | Freq: Three times a day (TID) | ORAL | Status: DC
  Administered 2012-01-28 – 2012-01-31 (×8): 300 mg via ORAL
  Filled 2012-01-27 (×8): qty 1

## 2012-01-27 MED ORDER — POTASSIUM CHLORIDE IN NACL 20-0.9 MEQ/L-% IV SOLN
INTRAVENOUS | Status: DC
  Administered 2012-01-27 – 2012-01-28 (×2): 1000 mL via INTRAVENOUS
  Administered 2012-01-28 – 2012-01-31 (×5): via INTRAVENOUS

## 2012-01-27 MED ORDER — ONDANSETRON HCL 4 MG/2ML IJ SOLN: 4.0000 mg | Freq: Four times a day (QID) | INTRAMUSCULAR | Status: DC | PRN

## 2012-01-27 MED ORDER — CITALOPRAM HYDROBROMIDE 20 MG PO TABS
10.0000 mg | ORAL_TABLET | Freq: Every day | ORAL | Status: DC
  Administered 2012-01-29 – 2012-02-02 (×5): 10 mg via ORAL
  Filled 2012-01-27 (×5): qty 1

## 2012-01-27 MED ORDER — ASPIRIN 325 MG PO TABS: 325.0000 mg | ORAL_TABLET | Freq: Every day | ORAL | Status: DC

## 2012-01-27 MED ORDER — ASPIRIN 81 MG PO CHEW
324.0000 mg | CHEWABLE_TABLET | Freq: Once | ORAL | Status: AC
  Administered 2012-01-27: 324 mg via ORAL
  Filled 2012-01-27: qty 4

## 2012-01-27 MED ORDER — NITROGLYCERIN IN D5W 200-5 MCG/ML-% IV SOLN
2.0000 ug/min | INTRAVENOUS | Status: DC
  Administered 2012-01-27: 5 ug/min via INTRAVENOUS
  Filled 2012-01-27: qty 250

## 2012-01-27 NOTE — Progress Notes (Signed)
Dr. Karilyn Cota returned page.  Gave orders for state ECG.  Notify Dr. Orvan Falconer of labs.  Orders followed.

## 2012-01-27 NOTE — Progress Notes (Signed)
CRITICAL VALUE ALERT  Critical value received:  Troponin 3.83    Date of notification:  01/27/2012   Time of notification:  1841  Critical value read back:yes  Nurse who received alert:  Fara Chute, RN  MD notified (1st page):  Dr. Karilyn Cota  Time of first page:  [phone]  MD notified (2nd page):  Time of second page:  Responding MD:  Dr. Karilyn Cota  Time MD responded:  [phone]

## 2012-01-27 NOTE — ED Provider Notes (Signed)
History  This chart was scribed for Amanda Hutching, MD by Shari Heritage. The patient was seen in room APA06/APA06. Patient's care was started at 1511.     CSN: [id number]  Arrival date & time 01/27/12  1504   First MD Initiated Contact with Patient 01/27/12 1511      Chief Complaint  Patient presents with  . Nausea  . Emesis  . Abdominal Pain    The history is provided by the EMS personnel. The history is limited by the condition of the patient. No language interpreter was used.    Level 5 Caveat - Unable to complete full ROS due to patient's dementia.  [name] is a 78 y.o. female who presents to the Emergency Department complaining of sudden emesis onset 1-2 hours ago. Patient denies abdominal pain at this time. Patient was transported to the ED by EMS after they received a call that patient was vomiting while shopping at [facility]. Per triage note, patient felt fine all day, then suddenly became nauseated and started vomiting. Patient was admitted to the hospital on 01/19/12 for treatment of a GI bleed and she stopped taking Coumadin at this time. Patient has a medical history of HTN, gout, peripheral neuropathy, hyperlipidemia, pancreas disorder, lymphocytic leukemia, MI and NSTEMI.   Past Medical History  Diagnosis Date  . HTN (hypertension)   . Coronary atherosclerosis of unspecified type of vessel, native or graft     a. remote CABG in 1999 with Dr. Laneta Simmers. b. s/p PCI with stent to LCX in 2009 (Platinum protocol study). c. NSTEMI 10/2011 s/p BMS to SVG->diagonal.  . Personal history of other diseases of digestive system   . Gout, unspecified     "have had it in my hands and my feet"  . Unspecified hereditary and idiopathic peripheral neuropathy   . Esophageal reflux   . Anemia   . GI bleed   . Leukocytosis 03/16/2011  . Hyperlipidemia     Statin intolerant  . Pancreas disorder     prior pancreatectomy in 2011 complicated by leak requiring prolong and repeated drainage and  subsequent pancreatic stent  . Other chronic pulmonary heart diseases   . Type I (juvenile type) diabetes mellitus without mention of complication, not stated as uncontrolled     Previously on insulin pump  . Permanent atrial fibrillation   . Anginal pain 11/24/11    "1st time"  . Exertional dyspnea 11/24/11  . Depression   . 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 CLL (chronic lymphocytic leukemia)     seeing Dr. [name]  . Myocardial infarction 1999    "on the verge of one"  . NSTEMI (non-ST elevated myocardial infarction) 10/2011    /H&P    Past Surgical History  Procedure Date  . Oophorectomy 1970's    w hysterectomy and bladder tacking procedure  . Belpharoptosis repair     right upper eyelid  . Esophagogastroduodenoscopy 07/11/2010  . Colonoscopy 10/20/06  . Tonsillectomy 1947  . Appendectomy 1970's?  . Abdominal hysterectomy 1970's  . Coronary angioplasty with stent placement 10/2011    "makes total of 4"; 20-30% ostial left main stenosis; occluded ostial LAD supplied by IMA graft, 20-30% scattered LCx stenoses with widely patent stent and 20% narrowing; diffuse RCA luminal irregularities up to 20-30%; SVG-diagonal patent, but proximal 90% stenosis s/p BMS; patent LIMA-LAD; LVEF 55-65%.  . Coronary angioplasty with stent placement 2009    LCX  . Cataract extraction w/ intraocular lens  implant,  bilateral ~ 2003  . Coronary artery bypass graft 1999    LIMA to LAD, SVG to the DX  . Pancreatectomy 2011    c/b leak requiring multiple drainage attempts and stent placement), /H&P  . Cardiac surgery   . Colonoscopy 01/20/2012    Procedure: COLONOSCOPY;  Surgeon: Malissa Hippo, MD;  Location: [facility];  Service: Endoscopy;  Laterality: N/A;    Family History  Problem Relation Age of Onset  . Stroke Mother   . Lung cancer Father   . Healthy Son     History  Substance Use Topics  . Smoking status: Never Smoker   . Smokeless tobacco: Never  Used  . Alcohol Use: No    OB History    Grav Para Term Preterm Abortions TAB SAB Ect Mult Living                  Review of Systems  Unable to perform ROS: Dementia    Allergies  Codeine; Ivp dye; Morphine; Prednisone; and Statins  Home Medications   Current Outpatient Rx  Name Route Sig Dispense Refill  . AMLODIPINE BESYLATE 10 MG PO TABS Oral Take 0.5 tablets (5 mg total) by mouth daily.    Marland Kitchen VITAMIN D3 50000 UNITS PO CAPS Oral Take 50,000 Units by mouth every 7 (seven) days. TAKES ON THURSDAYS    . COLCHICINE 0.6 MG PO TABS Oral Take 0.6 mg by mouth daily as needed. Gout Flare    . COLESEVELAM HCL 625 MG PO TABS Oral Take 625 mg by mouth 3 (three) times daily.    Marland Kitchen EZETIMIBE 10 MG PO TABS Oral Take 1 tablet (10 mg total) by mouth daily. 30 tablet 6  . FEBUXOSTAT 40 MG PO TABS Oral Take 40 mg by mouth daily.    Marland Kitchen GABAPENTIN 300 MG PO CAPS Oral Take 300 mg by mouth 3 (three) times daily.     . INSULIN ASPART 100 UNIT/ML Seibert SOLN Subcutaneous Inject 10-12 Units into the skin 3 (three) times daily as needed. FlexPen - Inject into skin subcutaneously. 10 units with breakfast, 10 units with lunch, and 12 units with supper. If sugar is running high patient may take an additional 2-4 units.    . INSULIN GLARGINE 100 UNIT/ML Tat[place] SOLN  Solostar Pen - Inject into skin subcutaneously. 44 units daily in AM and 26 units at bedtime. 1 pen 1    Follow up with your primary doctor for this medici ...  . METOPROLOL TARTRATE 25 MG PO TABS Oral Take 25 mg by mouth 2 (two) times daily.    Marland Kitchen NITROGLYCERIN 0.4 MG SL SUBL Sublingual Place 1 tablet (0.4 mg total) under the tongue every 5 (five) minutes x 3 doses as needed for chest pain. 25 tablet 4  . OLMESARTAN MEDOXOMIL 40 MG PO TABS Oral Take 40 mg by mouth daily.    Marland Kitchen ONDANSETRON HCL 4 MG PO TABS Oral Take 1 tablet (4 mg total) by mouth every 8 (eight) hours as needed for nausea. 30 tablet 1  . PANTOPRAZOLE SODIUM 20 MG PO TBEC Oral Take 20 mg by  mouth daily.     Marland Kitchen POLYETHYLENE GLYCOL 3350 PO PACK Oral Take 17 g by mouth daily.      BP 140/71  Pulse 65  Temp 97.2 F (36.2 C) (Axillary)  Resp 20  SpO2 97%  Physical Exam  Nursing note and vitals reviewed. Constitutional: She appears well-developed and well-nourished.  HENT:  Head: Normocephalic  and atraumatic.  Eyes: Conjunctivae normal and EOM are normal. Pupils are equal, round, and reactive to light.  Neck: Normal range of motion. Neck supple.  Cardiovascular: Normal rate, regular rhythm and normal heart sounds.   Pulmonary/Chest: Effort normal.  Abdominal: Soft. Bowel sounds are normal. There is no tenderness.       Emesis present on gown.  Musculoskeletal: Normal range of motion. She exhibits no edema.  Neurological: She is alert.       Unable.  Skin: Skin is warm and dry.  Psychiatric:       Flat affect.    ED Course  Procedures (including critical care time) DIAGNOSTIC STUDIES: Oxygen Saturation is 97% on room air, adequate by my interpretation.    COORDINATION OF CARE: 3:25pm- Patient informed of current plan for treatment and evaluation and agrees with plan at this time. Will admininster Zofran, Protonix and IV fluids. Will order CBC, comprehensive metabolic panel, blood lipase and abdominal X-ray.  Results for orders placed during the hospital encounter of 01/27/12  GLUCOSE, CAPILLARY      Component Value Range   Glucose-Capillary 252 (*) 70 - 99 mg/dL  CBC WITH DIFFERENTIAL      Component Value Range   WBC 17.1 (*) 4.0 - 10.5 K/uL   RBC 3.27 (*) 3.87 - 5.11 MIL/uL   Hemoglobin 9.9 (*) 12.0 - 15.0 g/dL   HCT 96.0 (*) 45.4 - 09.8 %   MCV 93.3  78.0 - 100.0 fL   MCH 30.3  26.0 - 34.0 pg   MCHC 32.5  30.0 - 36.0 g/dL   RDW 11.9  14.7 - 82.9 %   Platelets 591 (*) 150 - 400 K/uL   Neutrophils Relative 65  43 - 77 %   Neutro Abs 11.1 (*) 1.7 - 7.7 K/uL   Lymphocytes Relative 27  12 - 46 %   Lymphs Abs 4.6 (*) 0.7 - 4.0 K/uL   Monocytes Relative 6  3  - 12 %   Monocytes Absolute 1.1 (*) 0.1 - 1.0 K/uL   Eosinophils Relative 1  0 - 5 %   Eosinophils Absolute 0.2  0.0 - 0.7 K/uL   Basophils Relative 0  0 - 1 %   Basophils Absolute 0.1  0.0 - 0.1 K/uL  COMPREHENSIVE METABOLIC PANEL      Component Value Range   Sodium 137  135 - 145 mEq/L   Potassium 3.6  3.5 - 5.1 mEq/L   Chloride 102  96 - 112 mEq/L   CO2 26  19 - 32 mEq/L   Glucose, Bld 257 (*) 70 - 99 mg/dL   BUN 19  6 - 23 mg/dL   Creatinine, Ser 5.62  0.50 - 1.10 mg/dL   Calcium 9.7  8.4 - 13.0 mg/dL   Total Protein 7.5  6.0 - 8.3 g/dL   Albumin 3.7  3.5 - 5.2 g/dL   AST 15  0 - 37 U/L   ALT 12  0 - 35 U/L   Alkaline Phosphatase 89  39 - 117 U/L   Total Bilirubin 0.2 (*) 0.3 - 1.2 mg/dL   GFR calc non Af Amer 81 (*) >90 mL/min   GFR calc Af Amer >90  >90 mL/min  LIPASE, BLOOD      Component Value Range   Lipase 16  11 - 59 U/L   Date: 01/27/2012  Rate: *58 Rhythm: atrial fibrillation  QRS Axis: normal  Intervals: normal  ST/T Wave abnormalities: Inverted T waves laterally  Conduction Disutrbances:none  Narrative Interpretation:   Old EKG Reviewed: changes noted    No results found.   No diagnosis found.    MDM  Further history obtained from a female friend.  Patient had near syncopal spell in store.  She then vomited.  She feels slightly vertiginous.   She has a long history of gastrointestinal problems which are well-documented in her PMH.  IV hydration, IV Zofran and Protonix.  Discussed with hospitalist. Admit      I personally performed the services described in this documentation, which was scribed in my presence. The recorded information has been reviewed and considered.    Amanda Hutching, MD 01/27/12 [phone]

## 2012-01-27 NOTE — ED Notes (Signed)
EMS stated they responded to call of pt vomiting at [employer]. Pt stated she has felt fine all day until she "bent over" at [employer] and became nauseated and started vomiting. Pt was treated for GI bleed last week and her coumadin was stopped per EMS. Pt is c/o upper abd pain/nausea/vomiting.

## 2012-01-27 NOTE — Progress Notes (Signed)
Report called to ICU for transfer.

## 2012-01-27 NOTE — Progress Notes (Signed)
CRITICAL VALUE ALERT  Critical value received:  Troponin 3.62  Date of notification:  01/27/2012  Time of notification:  1848  Critical value read back:yes  Nurse who received alert:  Fara Chute, RN  MD notified (1st page):  Dr. Karilyn Cota  Time of first page:  1850  MD notified (2nd page):  Time of second page:  Responding MD:  Dr. Karilyn Cota  Time MD responded:  [phone]

## 2012-01-27 NOTE — H&P (Signed)
[facility] History and Physical  [name] ZOX:[id number] DOB: 13-Aug-1929 DOA: 01/27/2012  Referring physician: Dr. Adriana Simas, ER physician. PCP: Dwana Melena, MD    Chief Complaint: Near syncope, nausea and vomiting.  HPI: [name] is a 78 y.o. female describes an episode of near syncope after she bent over and stood up. She suddenly became lightheaded and almost passed out. Soon after this she had nausea and vomiting. She came to the emergency room and her symptoms of renal result. She was recently hospitalized for rectal bleeding. Anticoagulation was discontinued due to this as well as her aspirin. She did not have any abdominal pain.   Review of Systems: Apart from history of present illness other systems negative.  Past Medical History  Diagnosis Date  . HTN (hypertension)   . Coronary atherosclerosis of unspecified type of vessel, native or graft     a. remote CABG in 1999 with Dr. Laneta Simmers. b. s/p PCI with stent to LCX in 2009 (Platinum protocol study). c. NSTEMI 10/2011 s/p BMS to SVG->diagonal.  . Personal history of other diseases of digestive system   . Gout, unspecified     "have had it in my hands and my feet"  . Unspecified hereditary and idiopathic peripheral neuropathy   . Esophageal reflux   . Anemia   . GI bleed   . Leukocytosis 03/16/2011  . Hyperlipidemia     Statin intolerant  . Pancreas disorder     prior pancreatectomy in 2011 complicated by leak requiring prolong and repeated drainage and subsequent pancreatic stent  . Other chronic pulmonary heart diseases   . Type I (juvenile type) diabetes mellitus without mention of complication, not stated as uncontrolled     Previously on insulin pump  . Permanent atrial fibrillation   . Anginal pain 11/24/11    "1st time"  . Exertional dyspnea 11/24/11  . Depression   . 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 CLL (chronic lymphocytic leukemia)     seeing Dr. [name]  . Myocardial  infarction 1999    "on the verge of one"  . NSTEMI (non-ST elevated myocardial infarction) 10/2011    /H&P   Past Surgical History  Procedure Date  . Oophorectomy 1970's    w hysterectomy and bladder tacking procedure  . Belpharoptosis repair     right upper eyelid  . Esophagogastroduodenoscopy 07/11/2010  . Colonoscopy 10/20/06  . Tonsillectomy 1947  . Appendectomy 1970's?  . Abdominal hysterectomy 1970's  . Coronary angioplasty with stent placement 10/2011    "makes total of 4"; 20-30% ostial left main stenosis; occluded ostial LAD supplied by IMA graft, 20-30% scattered LCx stenoses with widely patent stent and 20% narrowing; diffuse RCA luminal irregularities up to 20-30%; SVG-diagonal patent, but proximal 90% stenosis s/p BMS; patent LIMA-LAD; LVEF 55-65%.  . Coronary angioplasty with stent placement 2009    LCX  . Cataract extraction w/ intraocular lens  implant, bilateral ~ 2003  . Coronary artery bypass graft 1999    LIMA to LAD, SVG to the DX  . Pancreatectomy 2011    c/b leak requiring multiple drainage attempts and stent placement), /H&P  . Cardiac surgery   . Colonoscopy 01/20/2012    Procedure: COLONOSCOPY;  Surgeon: Malissa Hippo, MD;  Location: [facility];  Service: Endoscopy;  Laterality: N/A;   Social History:  She lives by herself but has a caregiver who is constantly with her. She does not smoke, does not drink alcohol.  Allergies  Allergen Reactions  . Codeine Nausea And Vomiting  . Ivp Dye (Iodinated Diagnostic Agents) Other (See Comments)    Burst veins in her arm.   . Morphine Other (See Comments)    Not in Right State of Mind.   . Prednisone Other (See Comments)    Patient became delirious/combative when given 60mg  dose  . Statins Other (See Comments)    Leg Cramps.     Family History  Problem Relation Age of Onset  . Stroke Mother   . Lung cancer Father   . Healthy Son       Prior to Admission medications   Medication Sig Start Date End  Date Taking? Authorizing Provider  amLODipine (NORVASC) 10 MG tablet Take 5 mg by mouth at bedtime. 11/14/11  Yes Dayna N Dunn, PA  Cholecalciferol (VITAMIN D3) 50000 UNITS CAPS Take 50,000 Units by mouth every 7 (seven) days. TAKES ON THURSDAYS   Yes Historical Provider, MD  colchicine 0.6 MG tablet Take 0.6 mg by mouth daily as needed. Gout Flare   Yes Historical Provider, MD  colesevelam (WELCHOL) 625 MG tablet Take 625 mg by mouth 3 (three) times daily. 11/14/11 11/13/12 Yes Dayna N Dunn, PA  ezetimibe (ZETIA) 10 MG tablet Take 10 mg by mouth daily. 11/14/11 11/13/12 Yes Dayna N Dunn, PA  gabapentin (NEURONTIN) 300 MG capsule Take 300 mg by mouth 3 (three) times daily.    Yes Historical Provider, MD  insulin aspart (NOVOLOG) 100 UNIT/ML injection Inject 10-14 Units into the skin 3 (three) times daily as needed. FlexPen - Inject into skin subcutaneously. 10 units with breakfast, 10 units with lunch, and 12 units with supper. If sugar is running high patient may take an additional 2-4 units. 11/14/11  Yes Dayna N Dunn, PA  insulin glargine (LANTUS) 100 UNIT/ML injection Solostar Pen - Inject into skin subcutaneously. 44 units daily in AM and 26 units at bedtime. 11/14/11  Yes Dayna N Dunn, PA  metoprolol tartrate (LOPRESSOR) 25 MG tablet Take 25 mg by mouth 2 (two) times daily. 11/14/11 11/13/12 Yes Dayna N Dunn, PA  olmesartan (BENICAR) 40 MG tablet Take 40 mg by mouth daily. 11/14/11  Yes Dayna N Dunn, PA  pantoprazole (PROTONIX) 20 MG tablet Take 20 mg by mouth every morning.    Yes Historical Provider, MD  polyethylene glycol (MIRALAX / GLYCOLAX) packet Take 17 g by mouth daily. 11/14/11  Yes Dayna N Dunn, PA  escitalopram (LEXAPRO) 5 MG tablet Take 5 mg by mouth Daily. 01/15/12   Historical Provider, MD  febuxostat (ULORIC) 40 MG tablet Take 40 mg by mouth daily.    Historical Provider, MD  nitroGLYCERIN (NITROSTAT) 0.4 MG SL tablet Place 1 tablet (0.4 mg total) under the tongue every 5 (five) minutes x 3 doses as  needed for chest pain. 11/14/11 11/13/12  Dayna N Dunn, PA  ondansetron (ZOFRAN) 4 MG tablet Take 1 tablet (4 mg total) by mouth every 8 (eight) hours as needed for nausea. 12/28/11   Malissa Hippo, MD  PHENADOZ 25 MG suppository Place 1 suppository rectally Once daily as needed. For nausea and/or vomiting 11/23/11   Historical Provider, MD   Physical Exam: Filed Vitals:   01/27/12 1459 01/27/12 1716  BP: 140/71 123/50  Pulse: 65 69  Temp: 97.2 F (36.2 C)   TempSrc: Axillary   Resp: 20 20  SpO2: 97% 100%     General:  She looks systemically well, possibly slightly dehydrated.  Eyes: No pallor. No jaundice.  ENT: No abnormalities.  Neck: No lymphadenopathy.  Cardiovascular: Heart sounds are present and irregular consistent with atrial fibrillation.  Respiratory: Lung fields are clear.  Abdomen: Soft, nontender.  Skin: No rashes.  Musculoskeletal: No major joint deformities except for age-related osteoarthritis.  Psychiatric: Appropriate affect.  Neurologic: Alert and orientated without any focal neurological signs.  Labs on Admission:  Basic Metabolic Panel:  Lab 01/27/12 1610 01/21/12 0541  NA 137 --  K 3.6 --  CL 102 --  CO2 26 --  GLUCOSE 257* --  BUN 19 --  CREATININE 0.66 --  CALCIUM 9.7 --  MG -- 1.3*  PHOS -- --   Liver Function Tests:  Lab 01/27/12 1542  AST 15  ALT 12  ALKPHOS 89  BILITOT 0.2*  PROT 7.5  ALBUMIN 3.7    Lab 01/27/12 1542  LIPASE 16  AMYLASE --    CBC:  Lab 01/27/12 1542 01/21/12 0541 01/20/12 2320  WBC 17.1* -- --  NEUTROABS 11.1* -- --  HGB 9.9* 10.4* 9.7*  HCT 30.5* 31.9* --  MCV 93.3 -- --  PLT 591* -- --      BNP (last 3 results)  CBG:  Lab 01/27/12 1510 01/21/12 0751 01/20/12 2102  GLUCAP 252* 265* 284*    Radiological Exams on Admission: Dg Abd Acute W/chest  01/27/2012  *RADIOLOGY REPORT*  Clinical Data: Abdominal pain.  ACUTE ABDOMEN SERIES (ABDOMEN 2 VIEW & CHEST 1 VIEW)  Comparison: Plain film  chest 11/24/2011 and CT abdomen and pelvis 09/18/2011.  Findings: Single view of the chest demonstrates cardiomegaly and vascular congestion.  The patient is status post CABG.  No consolidative process, pneumothorax or effusion is identified.  Two views of the abdomen show no free intraperitoneal air.  The abdomen is nearly gasless.  Calcifications in the left upper quadrant are noted likely vascular.  IMPRESSION: No acute finding chest or abdomen.   Original Report Authenticated By: Bernadene Bell. Maricela Curet, M.D.     Assessment/Plan   1. Syncope, unclear etiology, possibly related to labyrinthitis. 2. Nausea and vomiting, likely related to #1. 3. Leukocytosis secondary to CLL. 4. Hypertension. 5. Insulin-dependent diabetes mellitus. Plan: 1. Admit to telemetry. 2. Serial cardiac enzymes. 3. Intravenous fluids. 4. Antiemetics as required. 5. Possibly discharge home tomorrow if she is improved.  Code Status: Full code.  Family Communication: Discussed plan with patient at the bedside.  Disposition Plan: Home in medically stable.   Time spent: 30 minutes.  Wilson Singer [facility] Pager [phone].  If 7PM-7AM, please contact night-coverage [email or web address] Password [email or web address] 01/27/2012, 5:51 PM

## 2012-01-28 ENCOUNTER — Observation Stay (HOSPITAL_COMMUNITY): Payer: Medicare Other

## 2012-01-28 DIAGNOSIS — I2510 Atherosclerotic heart disease of native coronary artery without angina pectoris: Secondary | ICD-10-CM

## 2012-01-28 DIAGNOSIS — I209 Angina pectoris, unspecified: Secondary | ICD-10-CM

## 2012-01-28 DIAGNOSIS — Z7901 Long term (current) use of anticoagulants: Secondary | ICD-10-CM

## 2012-01-28 DIAGNOSIS — I4891 Unspecified atrial fibrillation: Secondary | ICD-10-CM

## 2012-01-28 LAB — COMPREHENSIVE METABOLIC PANEL
ALT: 12 U/L (ref 0–35)
AST: 16 U/L (ref 0–37)
CO2: 26 mEq/L (ref 19–32)
Chloride: 103 mEq/L (ref 96–112)
GFR calc non Af Amer: 88 mL/min — ABNORMAL LOW (ref >90)
Sodium: 138 mEq/L (ref 135–145)
Total Bilirubin: 0.3 mg/dL (ref 0.3–1.2)

## 2012-01-28 LAB — GLUCOSE, CAPILLARY
Glucose-Capillary: 205 mg/dL — ABNORMAL HIGH (ref 70–99)
Glucose-Capillary: 172 mg/dL — ABNORMAL HIGH (ref 70–99)
Glucose-Capillary: 116 mg/dL — ABNORMAL HIGH (ref 70–99)

## 2012-01-28 LAB — CBC
MCH: 30 pg (ref 26.0–34.0)
MCHC: 32.6 g/dL (ref 30.0–36.0)
Platelets: 577 10*3/uL — ABNORMAL HIGH (ref 150–400)
RDW: 14.3 % (ref 11.5–15.5)

## 2012-01-28 LAB — PROTIME-INR: Prothrombin Time: 14.1 seconds (ref 11.6–15.2)

## 2012-01-28 MED ORDER — PANTOPRAZOLE SODIUM 40 MG IV SOLR
40.0000 mg | Freq: Every day | INTRAVENOUS | Status: DC
  Administered 2012-01-28 – 2012-01-30 (×3): 40 mg via INTRAVENOUS
  Filled 2012-01-28 (×3): qty 40

## 2012-01-28 MED ORDER — SODIUM CHLORIDE 0.9 % IJ SOLN
INTRAMUSCULAR | Status: AC
  Administered 2012-01-28: 10 mL
  Filled 2012-01-28: qty 3

## 2012-01-28 MED ORDER — MAGNESIUM SULFATE 40 MG/ML IJ SOLN
2.0000 g | Freq: Once | INTRAMUSCULAR | Status: AC
  Administered 2012-01-28: 2 g via INTRAVENOUS
  Filled 2012-01-28: qty 50

## 2012-01-28 MED ORDER — MAGNESIUM SULFATE 50 % IJ SOLN: 2.0000 g | Freq: Once | INTRAMUSCULAR | Status: DC

## 2012-01-28 MED ORDER — PIPERACILLIN-TAZOBACTAM 3.375 G IVPB
3.3750 g | Freq: Three times a day (TID) | INTRAVENOUS | Status: DC
  Administered 2012-01-28 – 2012-01-30 (×7): 3.375 g via INTRAVENOUS
  Filled 2012-01-28 (×13): qty 50

## 2012-01-28 MED ORDER — PANTOPRAZOLE SODIUM 40 MG PO TBEC
40.0000 mg | DELAYED_RELEASE_TABLET | Freq: Every day | ORAL | Status: DC
  Administered 2012-01-28: 40 mg via ORAL
  Filled 2012-01-28: qty 1

## 2012-01-28 MED ORDER — METOCLOPRAMIDE HCL 5 MG/ML IJ SOLN
10.0000 mg | Freq: Four times a day (QID) | INTRAMUSCULAR | Status: DC
  Administered 2012-01-28 – 2012-01-30 (×8): 10 mg via INTRAVENOUS
  Filled 2012-01-28 (×8): qty 2

## 2012-01-28 MED ORDER — SODIUM CHLORIDE 0.9 % IJ SOLN
INTRAMUSCULAR | Status: AC
  Filled 2012-01-28: qty 3

## 2012-01-28 MED ORDER — METOCLOPRAMIDE HCL 5 MG/ML IJ SOLN
5.0000 mg | Freq: Once | INTRAMUSCULAR | Status: AC
  Administered 2012-01-28: 5 mg via INTRAVENOUS
  Filled 2012-01-28: qty 2

## 2012-01-28 MED ORDER — ASPIRIN 81 MG PO CHEW: 162.0000 mg | CHEWABLE_TABLET | Freq: Every day | ORAL | Status: DC

## 2012-01-28 NOTE — Progress Notes (Addendum)
     Subjective: This lady continues to have nausea and vomiting. She also has epigastric pain. Cardiac enzymes were significantly elevated with troponins above 3. However, the pattern of troponins is not quite typical of myocardial infarction. Looking back at her records back in July 2013 when she apparently had a non-ST elevation MI, the troponin level has not changed much.           Physical Exam: Blood pressure 124/62, pulse 77, temperature 97.9 F (36.6 C), temperature source Oral, resp. rate 23, height 5\' 4"  (1.626 m), weight 73.3 kg (161 lb 9.6 oz), SpO2 98.00%. She looks uncomfortable. She has epigastric tenderness. Heart sounds are present and in atrial fibrillation. The rate is controlled. Lung fields are clear. She is alert and oriented.   Investigations:  Recent Results (from the past 240 hour(s))  MRSA PCR SCREENING     Status: Normal   Collection Time   01/27/12 10:35 PM      Component Value Range Status Comment   MRSA by PCR NEGATIVE  NEGATIVE Final      Basic Metabolic Panel:  Basename 01/28/12 0504 01/27/12 1542  NA 138 137  K 4.1 3.6  CL 103 102  CO2 26 26  GLUCOSE 258* 257*  BUN 10 19  CREATININE 0.52 0.66  CALCIUM 9.0 9.7  MG 1.0* --  PHOS -- --   Liver Function Tests:  [facility] 01/28/12 0504 01/27/12 1542  AST 16 15  ALT 12 12  ALKPHOS 82 89  BILITOT 0.3 0.2*  PROT 6.8 7.5  ALBUMIN 3.4* 3.7     CBC:  Basename 01/28/12 0504 01/27/12 1542  WBC 19.0* 17.1*  NEUTROABS -- 11.1*  HGB 9.7* 9.9*  HCT 29.8* 30.5*  MCV 92.3 93.3  PLT 577* 591*    Dg Abd Acute W/chest  01/27/2012  *RADIOLOGY REPORT*  Clinical Data: Abdominal pain.  ACUTE ABDOMEN SERIES (ABDOMEN 2 VIEW & CHEST 1 VIEW)  Comparison: Plain film chest 11/24/2011 and CT abdomen and pelvis 09/18/2011.  Findings: Single view of the chest demonstrates cardiomegaly and vascular congestion.  The patient is status post CABG.  No consolidative process, pneumothorax or effusion is  identified.  Two views of the abdomen show no free intraperitoneal air.  The abdomen is nearly gasless.  Calcifications in the left upper quadrant are noted likely vascular.  IMPRESSION: No acute finding chest or abdomen.   Original Report Authenticated By: Bernadene Bell. Maricela Curet, M.D.       Medications: I have reviewed the patient's current medications.  Impression: 1. Possible non-ST elevation myocardial infarction with elevated troponins. 2. Epigastric tenderness and nausea and vomiting. 3. Chronic atrial fibrillation, rate controlled. Anticoagulation held in view of recent rectal bleeding. 4. CLL. 5. History of syncope.     Plan: 1. Cardiology consultation this morning. Continue with nitroglycerin drip. 2. CT scan of the abdomen with oral contrast only (patient has IVP dye allergy). 3. I discussed CODE STATUS with patient. She agrees to be DO NOT RESUSCITATE. We will change this in order to reflect her decision.     LOS: 1 day   Wilson Singer Pager [phone]  01/28/2012, 7:44 AM

## 2012-01-28 NOTE — Progress Notes (Signed)
CRITICAL VALUE ALERT  Critical value received: Troponin 3.75  Date of notification:  01/27/12  Time of notification:  2356  Critical value read back:yes  Nurse who received alert:  Rockwell Germany RN  MD notified (1st page): Orvan Falconer  Time of first page: 0005  MD notified (2nd page):  Time of second page:  Responding MD:  Dr. Orvan Falconer  Time MD responded:  [phone]

## 2012-01-28 NOTE — Progress Notes (Signed)
Patient ID: DEMAYA HARDGE, female   DOB: Oct 03, 1929, 78 y.o.   MRN: [id number]   CARDIOLOGY CONSULT NOTE  Patient ID: AVALIE OCONNOR MRN: [id number], DOB/AGE: December 09, 1929   Admit date: 01/27/2012 Date of Consult: 01/28/2012   Primary Physician: Dwana Melena, MD Primary Cardiologist: Bonnee Quin MD  Pt. Profile  78 year old white female who presents with abdominal pain and rectal bleeding and has positive cardiac markers. Dr. Karilyn Cota to consult on her history of heart disease and these positive markers.  Problem List  Past Medical History  Diagnosis Date  . HTN (hypertension)   . Coronary atherosclerosis of unspecified type of vessel, native or graft     a. remote CABG in 1999 with Dr. Laneta Simmers. b. s/p PCI with stent to LCX in 2009 (Platinum protocol study). c. NSTEMI 10/2011 s/p BMS to SVG->diagonal.  . Personal history of other diseases of digestive system   . Gout, unspecified     "have had it in my hands and my feet"  . Unspecified hereditary and idiopathic peripheral neuropathy   . Esophageal reflux   . Anemia   . GI bleed   . Leukocytosis 03/16/2011  . Hyperlipidemia     Statin intolerant  . Pancreas disorder     prior pancreatectomy in 2011 complicated by leak requiring prolong and repeated drainage and subsequent pancreatic stent  . Other chronic pulmonary heart diseases   . Type I (juvenile type) diabetes mellitus without mention of complication, not stated as uncontrolled     Previously on insulin pump  . Permanent atrial fibrillation   . Anginal pain 11/24/11    "1st time"  . Exertional dyspnea 11/24/11  . Depression   . 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 CLL (chronic lymphocytic leukemia)     seeing Dr. [name]  . Myocardial infarction 1999    "on the verge of one"  . NSTEMI (non-ST elevated myocardial infarction) 10/2011    /H&P    Past Surgical History  Procedure Date  . Oophorectomy 1970's    w hysterectomy and bladder tacking  procedure  . Belpharoptosis repair     right upper eyelid  . Esophagogastroduodenoscopy 07/11/2010  . Colonoscopy 10/20/06  . Tonsillectomy 1947  . Appendectomy 1970's?  . Abdominal hysterectomy 1970's  . Coronary angioplasty with stent placement 10/2011    "makes total of 4"; 20-30% ostial left main stenosis; occluded ostial LAD supplied by IMA graft, 20-30% scattered LCx stenoses with widely patent stent and 20% narrowing; diffuse RCA luminal irregularities up to 20-30%; SVG-diagonal patent, but proximal 90% stenosis s/p BMS; patent LIMA-LAD; LVEF 55-65%.  . Coronary angioplasty with stent placement 2009    LCX  . Cataract extraction w/ intraocular lens  implant, bilateral ~ 2003  . Coronary artery bypass graft 1999    LIMA to LAD, SVG to the DX  . Pancreatectomy 2011    c/b leak requiring multiple drainage attempts and stent placement), /H&P  . Cardiac surgery   . Colonoscopy 01/20/2012    Procedure: COLONOSCOPY;  Surgeon: Malissa Hippo, MD;  Location: [facility];  Service: Endoscopy;  Laterality: N/A;     Allergies  Allergies  Allergen Reactions  . Codeine Nausea And Vomiting  . Ivp Dye (Iodinated Diagnostic Agents) Other (See Comments)    Burst veins in her arm.   . Morphine Other (See Comments)    Not in Right State of Mind.   . Prednisone Other (See Comments)    Patient became delirious/combative when  given 60mg  dose  . Statins Other (See Comments)    Leg Cramps.     HPI   Mrs Delhierro is an 78 year old white female who was admitted with the above complaint. She has a history of coronary artery disease with previous bypass surgery and PCI.  Her troponins have been positive this admission. Remarkably, this is been the case on several other admissions back in July and August. At those times, her CPK-MB's were normal. It was most likely noncardiac.  She denies any chest pain or ischemic heart symptoms. Her Coumadin for chronic A. fib has been stopped because the rectal  bleeding. She has agreed to being a DO NOT RESUSCITATE.  Inpatient Medications     . amLODipine  5 mg Oral QHS  . antiseptic oral rinse  15 mL Mouth Rinse BID  . aspirin  324 mg Oral Once  . aspirin  325 mg Oral Daily  . citalopram  10 mg Oral Daily  . ezetimibe  10 mg Oral q1800  . gabapentin  300 mg Oral TID  . heparin  5,000 Units Subcutaneous Q8H  . insulin aspart  0-20 Units Subcutaneous TID WC  . insulin aspart  0-5 Units Subcutaneous QHS  . irbesartan  300 mg Oral Daily  . LORazepam  0.5 mg Intravenous Once  . magnesium sulfate 1 - 4 g bolus IVPB  2 g Intravenous Once  . metoCLOPramide (REGLAN) injection  5 mg Intravenous Once  . metoprolol tartrate  25 mg Oral BID  . ondansetron  4 mg Intravenous Once  . ondansetron  4 mg Intravenous Once  . pantoprazole  40 mg Oral QAC breakfast  . pantoprazole (PROTONIX) IV  40 mg Intravenous Once  . pantoprazole (PROTONIX) IV  40 mg Intravenous Once  . polyethylene glycol  17 g Oral Daily  . sodium chloride  500 mL Intravenous Once  . sodium chloride  500 mL Intravenous Once  . sodium chloride  3 mL Intravenous Q12H  . sodium chloride      . DISCONTD: aspirin  325 mg Oral Daily  . DISCONTD: magnesium sulfate  2 g Intravenous Once  . DISCONTD: pantoprazole  20 mg Oral q morning - 10a    Family History Family History  Problem Relation Age of Onset  . Stroke Mother   . Lung cancer Father   . Healthy Son      Social History History   Social History  . Marital Status: Widowed    Spouse Name: N/A    Number of Children: N/A  . Years of Education: N/A   Occupational History  . Not on file.   Social History Main Topics  . Smoking status: Never Smoker   . Smokeless tobacco: Never Used  . Alcohol Use: No  . Drug Use: No  . Sexually Active: No   Other Topics Concern  . Not on file   Social History Narrative   Worked at [employer] for 41 years. Has a son who lives in [place].      Review of Systems  General:  No  chills, fever, night sweats or weight changes.  Cardiovascular:  No chest pain, dyspnea on exertion, edema, orthopnea, palpitations, paroxysmal nocturnal dyspnea. Dermatological: No rash, lesions/masses Respiratory: No cough, dyspnea Urologic: No hematuria, dysuria Abdominal:   No nausea, vomiting, diarrhea, bright red blood per rectum, melena, or hematemesis Neurologic:  No visual changes, wkns, changes in mental status. All other systems reviewed and are otherwise negative except as noted above.  Physical Exam  Blood pressure 147/70, pulse 69, temperature 97.6 F (36.4 C), temperature source Oral, resp. rate 24, height 5\' 4"  (1.626 m), weight 161 lb 9.6 oz (73.3 kg), SpO2 98.00%.  General: Pleasant, NAD, frail Psych: Normal affect. Neuro: Alert and oriented X 3. Moves all extremities spontaneously. HEENT: Normal  Neck: Supple without bruits or JVD. Lungs:  Resp regular and unlabored, CTA. Heart: Rate and rhythm, no s3, s4, or murmurs. Abdomen: Soft, non-tender, non-distended, BS + x 4.  Extremities: No clubbing, cyanosis or edema. DP/PT/Radials 2+ and equal bilaterally.  Labs   Basename 01/28/12 0505 01/27/12 2356 01/27/12 1812 01/27/12 1542  CKTOTAL -- -- -- --  CKMB -- -- -- --  TROPONINI 3.67* 3.75* 3.62* 3.83*   Lab Results  Component Value Date   WBC 19.0* 01/28/2012   HGB 9.7* 01/28/2012   HCT 29.8* 01/28/2012   MCV 92.3 01/28/2012   PLT 577* 01/28/2012    Lab 01/28/12 0504  NA 138  K 4.1  CL 103  CO2 26  BUN 10  CREATININE 0.52  CALCIUM 9.0  PROT 6.8  BILITOT 0.3  ALKPHOS 82  ALT 12  AST 16  GLUCOSE 258*   Lab Results  Component Value Date   CHOL 257* 11/05/2011   HDL 37* 11/05/2011   LDLCALC UNABLE TO CALCULATE IF TRIGLYCERIDE OVER 400 mg/dL 07/20/8117   TRIG 147* 12/10/5619   Lab Results  Component Value Date   DDIMER  Value: 0.38        AT THE INHOUSE ESTABLISHED CUTOFF VALUE OF 0.48 ug/mL FEU, THIS ASSAY HAS BEEN DOCUMENTED IN THE LITERATURE TO  HAVE A SENSITIVITY AND NEGATIVE PREDICTIVE VALUE OF AT LEAST 98 TO 99%.  THE TEST RESULT SHOULD BE CORRELATED WITH AN ASSESSMENT OF THE CLINICAL PROBABILITY OF DVT / VTE. 07/26/2008    Radiology/Studies  Nm Gastric Emptying  01/01/2012  *RADIOLOGY REPORT*  Clinical Data:  Abdominal pain and nausea.  NUCLEAR MEDICINE GASTRIC EMPTYING SCAN  Technique:  After oral ingestion of radiolabeled meal, sequential abdominal images were obtained for 120 minutes.  Residual percentage of activity remaining within the stomach was calculated at 120 minutes.  Radiopharmaceutical: 2.1 mCi Tc-34m sulfur colloid.  Comparison: None.  Findings: 3% of the ingested activity remained in the stomach at two hours.  The normal two hour gastric retention is less than 30%.  IMPRESSION: Normal examination.   Original Report Authenticated By: Darrol Angel, M.D.    Dg Abd Acute W/chest  01/27/2012  *RADIOLOGY REPORT*  Clinical Data: Abdominal pain.  ACUTE ABDOMEN SERIES (ABDOMEN 2 VIEW & CHEST 1 VIEW)  Comparison: Plain film chest 11/24/2011 and CT abdomen and pelvis 09/18/2011.  Findings: Single view of the chest demonstrates cardiomegaly and vascular congestion.  The patient is status post CABG.  No consolidative process, pneumothorax or effusion is identified.  Two views of the abdomen show no free intraperitoneal air.  The abdomen is nearly gasless.  Calcifications in the left upper quadrant are noted likely vascular.  IMPRESSION: No acute finding chest or abdomen.   Original Report Authenticated By: Bernadene Bell. D'ALESSIO, M.D.     ECG  A. fib which is chronic and rate controlled, no acute changes from previous ECGs  ASSESSMENT AND PLAN  Her cardiac markers, specifically her troponins, have been positive for months. There's little variation. CPK and MB on previous admissions this summer were normal. I do not feel this represents any myocardial injury. I would discontinue her IV nitroglycerin and continue  her outpatient  medications. I agree with rate control for A. fib and discontinue her anticoagulation because of rectal bleeding. Since she is now DO NOT RESUSCITATE, I would recommend palliative care and no further aggressive workup. Comfort measures are foremost important. Call us if you have any questions.   Signed, Valera Castle, MD 01/28/2012, 8:44 AM

## 2012-01-28 NOTE — Progress Notes (Signed)
ANTIBIOTIC CONSULT NOTE - INITIAL  Pharmacy Consult for Zosyn Indication: aspiration pneumonia  Allergies  Allergen Reactions  . Codeine Nausea And Vomiting  . Ivp Dye (Iodinated Diagnostic Agents) Other (See Comments)    Burst veins in her arm.   . Morphine Other (See Comments)    Not in Right State of Mind.   . Prednisone Other (See Comments)    Patient became delirious/combative when given 60mg  dose  . Statins Other (See Comments)    Leg Cramps.     Patient Measurements: Height: 5\' 4"  (162.6 cm) Weight: 161 lb 9.6 oz (73.3 kg) IBW/kg (Calculated) : 54.7   Vital Signs: Temp: 97.3 F (36.3 C) (10/17 1139) Temp src: Oral (10/17 1139) BP: 147/70 mmHg (10/17 0824) Pulse Rate: 69  (10/17 0824) Intake/Output from previous day: 10/16 0701 - 10/17 0700 In: 339.2 [I.V.:339.2] Out: 1750 [Urine:1750] Intake/Output from this shift:    Labs:  [facility] 01/28/12 0504 01/27/12 1542  WBC 19.0* 17.1*  HGB 9.7* 9.9*  PLT 577* 591*  LABCREA -- --  CREATININE 0.52 0.66   Estimated Creatinine Clearance: 55 ml/min (by C-G formula based on Cr of 0.52). No results found for this basename: VANCOTROUGH:2,VANCOPEAK:2,VANCORANDOM:2,GENTTROUGH:2,GENTPEAK:2,GENTRANDOM:2,TOBRATROUGH:2,TOBRAPEAK:2,TOBRARND:2,AMIKACINPEAK:2,AMIKACINTROU:2,AMIKACIN:2, in the last 72 hours   Microbiology: Recent Results (from the past 720 hour(s))  MRSA PCR SCREENING     Status: Normal   Collection Time   01/27/12 10:35 PM      Component Value Range Status Comment   MRSA by PCR NEGATIVE  NEGATIVE Final     Medical History: Past Medical History  Diagnosis Date  . HTN (hypertension)   . Coronary atherosclerosis of unspecified type of vessel, native or graft     a. remote CABG in 1999 with Dr. Laneta Simmers. b. s/p PCI with stent to LCX in 2009 (Platinum protocol study). c. NSTEMI 10/2011 s/p BMS to SVG->diagonal.  . Personal history of other diseases of digestive system   . Gout, unspecified     "have had it in  my hands and my feet"  . Unspecified hereditary and idiopathic peripheral neuropathy   . Esophageal reflux   . Anemia   . GI bleed   . Leukocytosis 03/16/2011  . Hyperlipidemia     Statin intolerant  . Pancreas disorder     prior pancreatectomy in 2011 complicated by leak requiring prolong and repeated drainage and subsequent pancreatic stent  . Other chronic pulmonary heart diseases   . Type I (juvenile type) diabetes mellitus without mention of complication, not stated as uncontrolled     Previously on insulin pump  . Permanent atrial fibrillation   . Anginal pain 11/24/11    "1st time"  . Exertional dyspnea 11/24/11  . Depression   . 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 CLL (chronic lymphocytic leukemia)     seeing Dr. [name]  . Myocardial infarction 1999    "on the verge of one"  . NSTEMI (non-ST elevated myocardial infarction) 10/2011    /H&P    Medications:  Scheduled:    . amLODipine  5 mg Oral QHS  . antiseptic oral rinse  15 mL Mouth Rinse BID  . aspirin  324 mg Oral Once  . citalopram  10 mg Oral Daily  . ezetimibe  10 mg Oral q1800  . gabapentin  300 mg Oral TID  . heparin  5,000 Units Subcutaneous Q8H  . insulin aspart  0-20 Units Subcutaneous TID WC  . insulin aspart  0-5 Units Subcutaneous QHS  .  irbesartan  300 mg Oral Daily  . LORazepam  0.5 mg Intravenous Once  . magnesium sulfate 1 - 4 g bolus IVPB  2 g Intravenous Once  . metoCLOPramide (REGLAN) injection  10 mg Intravenous Q6H  . metoCLOPramide (REGLAN) injection  5 mg Intravenous Once  . metoprolol tartrate  25 mg Oral BID  . ondansetron  4 mg Intravenous Once  . ondansetron  4 mg Intravenous Once  . pantoprazole (PROTONIX) IV  40 mg Intravenous Once  . pantoprazole (PROTONIX) IV  40 mg Intravenous Once  . pantoprazole (PROTONIX) IV  40 mg Intravenous Daily  . piperacillin-tazobactam (ZOSYN)  IV  3.375 g Intravenous Q8H  . polyethylene glycol  17 g Oral Daily  . sodium  chloride  500 mL Intravenous Once  . sodium chloride  500 mL Intravenous Once  . sodium chloride  3 mL Intravenous Q12H  . sodium chloride      . DISCONTD: aspirin  162 mg Oral Daily  . DISCONTD: aspirin  325 mg Oral Daily  . DISCONTD: aspirin  325 mg Oral Daily  . DISCONTD: magnesium sulfate  2 g Intravenous Once  . DISCONTD: pantoprazole  20 mg Oral q morning - 10a  . DISCONTD: pantoprazole  40 mg Oral QAC breakfast   Assessment: 78 yo F to start on IV Zosyn for possible aspiration PNA.   Her renal function is at baseline.   Goal of Therapy:  Eradicate infection.  Plan:  1) Zosyn 3.375gm IV Q8h to be infused over 4hrs 2) Monitor renal function and cx data   Elson Clan 01/28/2012,12:05 PM

## 2012-01-28 NOTE — Progress Notes (Signed)
CRITICAL VALUE ALERT  Critical value received:  Troponin 3.67  Date of notification:  01/28/12  Time of notification: 0615  Critical value read back:yes  Nurse who received alert: Rockwell Germany RN  MD notified (1st page):  Dr. Orvan Falconer  Time of first page:  0615  MD notified (2nd page):  Time of second page:  Responding MD:  Result less than previous  Time MD responded: [phone]

## 2012-01-28 NOTE — Progress Notes (Signed)
UR Chart Review Completed  

## 2012-01-28 NOTE — Progress Notes (Signed)
Inpatient Diabetes Program Recommendations  AACE/ADA: New Consensus Statement on Inpatient Glycemic Control (2013)  Target Ranges:  Prepandial:   less than 140 mg/dL      Peak postprandial:   less than 180 mg/dL (1-2 hours)      Critically ill patients:  140 - 180 mg/dL   Reason for Visit: Hyperglycemia Pt on Lantus 44 units in the am and 26 units at HS at home.  Please start with at least half of the home dose while here, 22 units in the am and 13 units at HS (in addition to correction as orderd) Inpatient Diabetes Program Recommendations Correction (SSI): xxxxx  Note: Thank you, Lenor Coffin, RN, CNS, Diabetes Coordinator [phone])

## 2012-01-28 NOTE — Progress Notes (Signed)
Notified Son Loraine Leriche of transfer to ICU floor, Nitroglycerin gtt and beta blocker given.  Patient hesitant to take any other pills. No Chest Pin.  Only complaint of nausea and vomiting turnip greens.  Ate at the [facility] yesterday.

## 2012-01-29 ENCOUNTER — Inpatient Hospital Stay (HOSPITAL_COMMUNITY): Payer: Medicare Other

## 2012-01-29 LAB — BASIC METABOLIC PANEL
BUN: 8 mg/dL (ref 6–23)
Chloride: 102 mEq/L (ref 96–112)
GFR calc Af Amer: >90 mL/min (ref >90)
Glucose, Bld: 125 mg/dL — ABNORMAL HIGH (ref 70–99)
Potassium: 3.7 mEq/L (ref 3.5–5.1)

## 2012-01-29 LAB — GLUCOSE, CAPILLARY: Glucose-Capillary: 151 mg/dL — ABNORMAL HIGH (ref 70–99)

## 2012-01-29 MED ORDER — SODIUM CHLORIDE 0.9 % IJ SOLN
INTRAMUSCULAR | Status: AC
  Administered 2012-01-29: 10 mL
  Filled 2012-01-29: qty 3

## 2012-01-29 MED ORDER — ONDANSETRON HCL 4 MG/2ML IJ SOLN
4.0000 mg | Freq: Four times a day (QID) | INTRAMUSCULAR | Status: AC
  Administered 2012-01-29 – 2012-01-30 (×4): 4 mg via INTRAVENOUS
  Filled 2012-01-29 (×4): qty 2

## 2012-01-29 NOTE — Progress Notes (Addendum)
Subjective: This lady continues to have nausea and vomiting. She also has epigastric pain. Cardiac enzymes were significantly elevated with troponins above 3. However, the pattern of troponins is not quite typical of myocardial infarction. Looking back at her records back in July 2013 when she apparently had a non-ST elevation MI, the troponin level has not changed much. Today she continues to have nausea and some vomiting. CT scan of the abdomen was mostly unrevealing except for the presence of bilateral lower lobe pneumonia. She has been started on intravenous Zosyn as of yesterday. Her troponins were elevated but cardiology, Dr. Daleen Squibb  feels that this is a chronic issue and not representative of myocardial ischemia or infarction. Nitroglycerin drip was stopped yesterday.          Physical Exam: Blood pressure 148/77, pulse 89, temperature 99 F (37.2 C), temperature source Oral, resp. rate 20, height 5\' 4"  (1.626 m), weight 73.3 kg (161 lb 9.6 oz), SpO2 96.00%. She looks uncomfortable. She has epigastric tenderness. Heart sounds are present and in atrial fibrillation. The rate is controlled. Lung fields are clear clinically. She is alert and oriented.   Investigations:  Recent Results (from the past 240 hour(s))  MRSA PCR SCREENING     Status: Normal   Collection Time   01/27/12 10:35 PM      Component Value Range Status Comment   MRSA by PCR NEGATIVE  NEGATIVE Final      Basic Metabolic Panel:  Basename 01/29/12 0454 01/28/12 0504  NA 139 138  K 3.7 4.1  CL 102 103  CO2 27 26  GLUCOSE 125* 258*  BUN 8 10  CREATININE 0.61 0.52  CALCIUM 9.3 9.0  MG -- 1.0*  PHOS -- --   Liver Function Tests:  [facility] 01/28/12 0504 01/27/12 1542  AST 16 15  ALT 12 12  ALKPHOS 82 89  BILITOT 0.3 0.2*  PROT 6.8 7.5  ALBUMIN 3.4* 3.7     CBC:  Basename 01/28/12 0504 01/27/12 1542  WBC 19.0* 17.1*  NEUTROABS -- 11.1*  HGB 9.7* 9.9*  HCT 29.8* 30.5*  MCV 92.3 93.3    PLT 577* 591*    Ct Abdomen Pelvis Wo Contrast  01/28/2012  *RADIOLOGY REPORT*  Clinical Data: Abdominal pain, nausea, vomiting.  CT ABDOMEN AND PELVIS WITHOUT CONTRAST  Technique:  Multidetector CT imaging of the abdomen and pelvis was performed following the standard protocol without intravenous contrast.  Comparison: 09/18/2011  Findings: Bilateral lower lobe areas of consolidation, atelectasis versus pneumonia.  Cardiomegaly.  No effusions.  Postsurgical changes within the stomach.  Gallbladder, pancreas, adrenals are unremarkable.  Mild cortical thinning and perinephric stranding about the kidneys.  No hydronephrosis. Prior splenectomy.  New area of irregular low density within the inferior right hepatic lobe.  This does not appear rounded or mass-like.  I suspect this may represent a new focal fatty infiltration, but further evaluation with ultrasound or contrasted CT may be helpful.  Colonic diverticulosis.  No active diverticulitis.  Small bowel is decompressed.  No free fluid, free air or adenopathy.  Prior hysterectomy.  No adnexal masses.  Urinary bladder decompressed with Foley catheter in place.  Aorta and iliac vessels are heavily calcified.  No aneurysm.  IMPRESSION: Bilateral lower lobe consolidation concerning for pneumonia.  New irregular low density in the inferior right hepatic lobe. Given its.  It is and rapid onset, I suspect this represents focal fatty infiltration. May consider ultrasound or contrasted CT for confirmation.  Colonic  diverticulosis.  No acute findings in the abdomen or pelvis.   Original Report Authenticated By: Cyndie Chime, M.D.    Dg Abd Acute W/chest  01/27/2012  *RADIOLOGY REPORT*  Clinical Data: Abdominal pain.  ACUTE ABDOMEN SERIES (ABDOMEN 2 VIEW & CHEST 1 VIEW)  Comparison: Plain film chest 11/24/2011 and CT abdomen and pelvis 09/18/2011.  Findings: Single view of the chest demonstrates cardiomegaly and vascular congestion.  The patient is status post  CABG.  No consolidative process, pneumothorax or effusion is identified.  Two views of the abdomen show no free intraperitoneal air.  The abdomen is nearly gasless.  Calcifications in the left upper quadrant are noted likely vascular.  IMPRESSION: No acute finding chest or abdomen.   Original Report Authenticated By: Bernadene Bell. Maricela Curet, M.D.       Medications: I have reviewed the patient's current medications.  Impression: 1. Chronically elevated troponin levels, unlikely to represent myocardial ischemia. 2. Epigastric tenderness and nausea and vomiting, unclear etiology. 3. Chronic atrial fibrillation, rate controlled. Anticoagulation held in view of recent rectal bleeding. 4. CLL. 5. History of syncope, possibly related to labyrinthitis or a vagal episode.     Plan: 1. Gastroenterology consultation. I wonder if she needs EGD to further evaluate. 2. Continue with antibiotics for pneumonia. I have spoken to the patient's son and he is aware of ongoing workup. He is also aware that the patient is now DO NOT RESUSCITATE. He is in agreement with this.     LOS: 2 days   Wilson Singer Pager [phone]  01/29/2012, 10:17 AM    Addendum: In view of the patient's persistent nausea and initial symptoms, a CT brain scan was done. This showed the presence of an acute new right inferior medial cerebellar CVA. I've discussed this finding with the patient's son. The patient had been off aspirin and Coumadin in view of recent rectal bleeding. Neurology has been consulted.

## 2012-01-29 NOTE — Consult Note (Signed)
Patient interviewed and examined. Lab studies reviewed along with abdominopelvic CT and acute abdominal series of the chest film. Head CT results also noted indicating recent acute infarct. History obtained from the patient. Patient is well known to me from multiple previous evaluations including one from week for last when she was admitted with acute GI bleed. She underwent colonoscopy and felt to be bleeding from colonic diverticulosis. Her warfarin was discontinued and she stopped bleeding spontaneously and did not require transfusion. Present illness started 2 days ago while she was at [facility] store in [facility] following lunch at a [employer]. She recalls as she stooped to pick up song record; as she tried to straighten up developed severe vertigo and fell she was going to fall down and passed out. She became diaphoretic and started to vomit. She had multiple episodes of vomiting. She does not recall that she had chest or abdominal pain. She was brought to emergency room noted to have elevated troponin levels. She was also found to have bilateral pneumonia and is currently on antibiotic. She was evaluated by Dr. Valera Castle of cardiology. He felt her chronically elevated troponin levels are not indicated of acute MI and recommended supportive therapy. In the meantime she has remains with nausea but she has not vomited today. Abdominopelvic CT yesterday which was unremarkable for acute abnormalities. It was unenhanced study. Today she had head CT by Dr. Raquel James. Patient does not feel well but she wants to go home. She denies heartburn chest or abdominal pain. He also denies rectal bleeding or melena. She has not experienced any more episodes of rectal bleeding since recent hospitalization. Patient is alert and responds appropriately to questions. Abdomen is symmetrical. Bowel sounds are normal. Abdomen is soft and nontender. Left lobe of liver is palpable. LFTs from yesterday are  within normal limits except albumin of 3.4. Serum calcium was 9.4 H&H from yesterday was 9.7 and 29.8. WBC 19.0 and platelet count 577K. Assessment; #1. Nausea and vomiting. While she's had nausea chronically acute symptomatology would appear to be secondary to acute vertigo or acute CVA. As far as chronic nausea is concerned I suspect it is secondary to medications. Given that she has long-standing diabetes mellitus, I thought she could have gastroparesis; however gastric emptying study 1 month ago was within normal. It is also possible that her chronic nausea is secondary to poor health and depression. She did have EGD last year and it was negative for peptic ulce.r disease. #2. Recent history of colonic diverticular bleed. No evidence of overt bleeding. I do not have objection to using low-dose aspirin if needed. #3. Pneumonia possibly secondary to aspiration. He does not have respiratory symptoms.  Recommendations; Agree with empiric short-term therapy with metoclopramide. We'll give her Zofran IV every 6x4 and thereafter when necessary. I would agree with using low-dose aspirin if you feel it is indicated for acute nonhemorrhagic CVA. If she has further episodes of emesis we will consider EGD once stable from medical standpoint. Patient's condition reviewed with Dr. Karilyn Cota. Dr. Darrick Penna will be assisting you with GI issues over the weekend.

## 2012-01-30 DIAGNOSIS — I10 Essential (primary) hypertension: Secondary | ICD-10-CM

## 2012-01-30 DIAGNOSIS — I6350 Cerebral infarction due to unspecified occlusion or stenosis of unspecified cerebral artery: Principal | ICD-10-CM

## 2012-01-30 DIAGNOSIS — R110 Nausea: Secondary | ICD-10-CM

## 2012-01-30 DIAGNOSIS — I639 Cerebral infarction, unspecified: Secondary | ICD-10-CM | POA: Diagnosis present

## 2012-01-30 LAB — COMPREHENSIVE METABOLIC PANEL
AST: 16 U/L (ref 0–37)
Albumin: 3.2 g/dL — ABNORMAL LOW (ref 3.5–5.2)
Calcium: 9.7 mg/dL (ref 8.4–10.5)
Chloride: 98 mEq/L (ref 96–112)
Creatinine, Ser: 0.59 mg/dL (ref 0.50–1.10)
Total Protein: 6.9 g/dL (ref 6.0–8.3)

## 2012-01-30 LAB — GLUCOSE, CAPILLARY
Glucose-Capillary: 192 mg/dL — ABNORMAL HIGH (ref 70–99)
Glucose-Capillary: 227 mg/dL — ABNORMAL HIGH (ref 70–99)
Glucose-Capillary: 215 mg/dL — ABNORMAL HIGH (ref 70–99)
Glucose-Capillary: 318 mg/dL — ABNORMAL HIGH (ref 70–99)

## 2012-01-30 LAB — CBC
MCH: 30 pg (ref 26.0–34.0)
MCV: 91.9 fL (ref 78.0–100.0)
Platelets: 619 10*3/uL — ABNORMAL HIGH (ref 150–400)
RDW: 14 % (ref 11.5–15.5)
WBC: 17.8 10*3/uL — ABNORMAL HIGH (ref 4.0–10.5)

## 2012-01-30 MED ORDER — ASPIRIN 81 MG PO CHEW
81.0000 mg | CHEWABLE_TABLET | Freq: Every day | ORAL | Status: DC
  Administered 2012-01-30 – 2012-02-01 (×3): 81 mg via ORAL
  Filled 2012-01-30 (×3): qty 1

## 2012-01-30 MED ORDER — INSULIN GLARGINE 100 UNIT/ML ~~LOC~~ SOLN
10.0000 [IU] | Freq: Every day | SUBCUTANEOUS | Status: DC
  Administered 2012-01-30: 10 [IU] via SUBCUTANEOUS

## 2012-01-30 MED ORDER — PANTOPRAZOLE SODIUM 40 MG PO TBEC
40.0000 mg | DELAYED_RELEASE_TABLET | Freq: Every day | ORAL | Status: DC
  Administered 2012-01-31 – 2012-02-02 (×3): 40 mg via ORAL
  Filled 2012-01-30 (×3): qty 1

## 2012-01-30 MED ORDER — POLYETHYLENE GLYCOL 3350 17 G PO PACK: 17.0000 g | PACK | Freq: Two times a day (BID) | ORAL | Status: DC

## 2012-01-30 MED ORDER — SCOPOLAMINE 1 MG/3DAYS TD PT72
1.0000 | MEDICATED_PATCH | TRANSDERMAL | Status: DC
  Administered 2012-01-30: 1.5 mg via TRANSDERMAL
  Filled 2012-01-30 (×2): qty 1

## 2012-01-30 NOTE — Progress Notes (Signed)
Pt stated feeling better but did not feel up to starting deep breathing exercise. Pt stated will start in AM.

## 2012-01-30 NOTE — Progress Notes (Addendum)
Chart reviewed.  Subjective: Patient has had no vomiting, but remains nauseated. Not eating much. Dry heaves. Dizzy occasionally. Has not ambulated much.  Objective: Vital signs in last 24 hours: Filed Vitals:   01/30/12 0147 01/30/12 0149 01/30/12 0537 01/30/12 1409  BP: 135/76  143/67 149/65  Pulse: 74 72 66 64  Temp: 97.8 F (36.6 C)  98.2 F (36.8 C) 96.5 F (35.8 C)  TempSrc: Oral  Oral Oral  Resp: 18  16 18   Height:      Weight:      SpO2: 98%  92% 95%   Weight change:   Intake/Output Summary (Last 24 hours) at 01/30/12 1610 Last data filed at 01/30/12 0536  Gross per 24 hour  Intake 3920.5 ml  Output   2450 ml  Net 1470.5 ml   General: Comfortable lying supine in bed, emesis basin at her side with clear liquid and paper towels. HEENT: No nystagmus Lungs clear to auscultation bilaterally without wheeze rhonchi or rales Cardiovascular irregularly irregular without murmurs gallops rubs Abdomen soft nontender nondistended Extremities no clubbing cyanosis or edema Neurologic: Cranial nerves intact. Finger to nose is normal. Strength 5 out of 5. Sensation intact. Did not test gait.  Lab Results: Basic Metabolic Panel:  Lab 01/30/12 8295 01/29/12 0454 01/28/12 0504  NA 137 139 --  K 3.6 3.7 --  CL 98 102 --  CO2 28 27 --  GLUCOSE 183* 125* --  BUN 10 8 --  CREATININE 0.59 0.61 --  CALCIUM 9.7 9.3 --  MG -- -- 1.0*  PHOS -- -- --   Liver Function Tests:  Lab 01/30/12 0536 01/28/12 0504  AST 16 16  ALT 9 12  ALKPHOS 77 82  BILITOT 0.6 0.3  PROT 6.9 6.8  ALBUMIN 3.2* 3.4*    Lab 01/28/12 0810 01/27/12 1542  LIPASE 9* 16  AMYLASE -- --   No results found for this basename: AMMONIA:2 in the last 168 hours CBC:  Lab 01/30/12 0536 01/28/12 0504 01/27/12 1542  WBC 17.8* 19.0* --  NEUTROABS -- -- 11.1*  HGB 10.8* 9.7* --  HCT 33.1* 29.8* --  MCV 91.9 92.3 --  PLT 619* 577* --   Cardiac Enzymes:  Lab 01/28/12 0505 01/27/12 2356 01/27/12 1812    CKTOTAL -- -- --  CKMB -- -- --  CKMBINDEX -- -- --  TROPONINI 3.67* 3.75* 3.62*   BNP: No results found for this basename: PROBNP:3 in the last 168 hours D-Dimer: No results found for this basename: DDIMER:2 in the last 168 hours CBG:  Lab 01/30/12 1146 01/30/12 0745 01/29/12 2022 01/29/12 1631 01/29/12 1116 01/29/12 0752  GLUCAP 227* 192* 156* 167* 175* 151*   Hemoglobin A1C: No results found for this basename: HGBA1C in the last 168 hours Fasting Lipid Panel: No results found for this basename: CHOL,HDL,LDLCALC,TRIG,CHOLHDL,LDLDIRECT in the last 621 hours Thyroid Function Tests:  Lab 01/27/12 1812  TSH 1.307  T4TOTAL --  FREET4 --  T3FREE --  THYROIDAB --   Coagulation:  Lab 01/28/12 0504  LABPROT 14.1  INR 1.10   Micro Results: Recent Results (from the past 240 hour(s))  MRSA PCR SCREENING     Status: Normal   Collection Time   01/27/12 10:35 PM      Component Value Range Status Comment   MRSA by PCR NEGATIVE  NEGATIVE Final    Studies/Results: Ct Head Wo Contrast  01/29/2012  *RADIOLOGY REPORT*  Clinical Data: Syncope.  Nausea and vomiting.  CT HEAD  WITHOUT CONTRAST  Technique:  Contiguous axial images were obtained from the base of the skull through the vertex without contrast.  Comparison: MRI brain without contrast 06/08/2003.  Findings: Remote anterior right frontal encephalomalacia is stable. Additional white matter changes within the right external capsule and posterior limb of the internal capsule are stable. Periventricular and subcortical white matter hypoattenuation bilaterally corresponds with the prior study.  Bilateral lacunar infarcts in the caudate heads are new, but appear remote.  Moderate generalized atrophy is stable.  A new medial inferior right cerebellar non hemorrhagic infarct is present.  There is effacement of the sulci.  The ventricles are of normal size.  No significant extra-axial fluid collections are present.  A fluid collection is  present in the left maxillary sinus.  The remaining paranasal sinuses and to the mastoid air cells are clear.  IMPRESSION:  1.  New right inferior medial cerebellar non hemorrhagic infarct. 2.  Stable encephalomalacia of the anterior right frontal lobe. 3.  Lacunar infarcts of the caudate heads bilaterally appear remote, but are new since 2005. 4.  Otherwise stable atrophy and diffuse white matter disease. 5.  Left maxillary sinusitis.   Original Report Authenticated By: Jamesetta Orleans. MATTERN, M.D.    Scheduled Meds:   . amLODipine  5 mg Oral QHS  . antiseptic oral rinse  15 mL Mouth Rinse BID  . aspirin  81 mg Oral Daily  . citalopram  10 mg Oral Daily  . ezetimibe  10 mg Oral q1800  . gabapentin  300 mg Oral TID  . heparin  5,000 Units Subcutaneous Q8H  . insulin aspart  0-20 Units Subcutaneous TID WC  . insulin aspart  0-5 Units Subcutaneous QHS  . irbesartan  300 mg Oral Daily  . metoCLOPramide (REGLAN) injection  10 mg Intravenous Q6H  . metoprolol tartrate  25 mg Oral BID  . ondansetron (ZOFRAN) IV  4 mg Intravenous Q6H  . pantoprazole (PROTONIX) IV  40 mg Intravenous Daily  . piperacillin-tazobactam (ZOSYN)  IV  3.375 g Intravenous Q8H  . polyethylene glycol  17 g Oral BID  . sodium chloride  3 mL Intravenous Q12H  . DISCONTD: polyethylene glycol  17 g Oral Daily   Continuous Infusions:   . 0.9 % NaCl with KCl 20 mEq / L 50 mL/hr at 01/29/12 2021   PRN Meds:.colchicine, ondansetron (ZOFRAN) IV Assessment/Plan: Principal Problem:  *Cerebellar stroke, acute Active Problems:  Nausea and vomiting  IDDM  HYPERTENSION  Atrial fibrillation  HYPERCHOLESTEROLEMIA  IIA  CLL (chronic lymphocytic leukemia)  Elevated troponin, without evidence of MI.  Hypertension   patient is still nauseated. Not clear that Reglan is helping. Will stop Reglan and try scopolamine patch. Await physical therapy. Add occupational therapy. Had an echocardiogram in July of this year which showed normal  LV function with mild LV hypertrophy. Biatrial enlargement. We'll get carotid Dopplers and fasting lipid panel. Patient has been started back on low-dose aspirin. Coumadin and aspirin were stopped in the setting of acute GI bleed. Patient had followup with Dr. Tedra Senegal, but his note is incomplete. Will resume Lantus at lower dose. No clinical evidence of pneumonia. Will stop Zosyn and monitor.   LOS: 3 days   Strother Everitt L 01/30/2012, 4:10 PM

## 2012-01-30 NOTE — Progress Notes (Signed)
Subjective: PT ADMITTED 10/16 WITH NV/ABD PAIN. No vomiting IN 24 HOURS. Continues with nausea with changes in position. Tolerating pos. One BM since admission.  Objective: Vital signs in last 24 hours: Temp:  [97.8 F (36.6 C)-98.2 F (36.8 C)] 98.2 F (36.8 C) (10/19 0537) Pulse Rate:  [66-82] 66  (10/19 0537) Resp:  [12-24] 16  (10/19 0537) BP: (135-164)/(67-85) 143/67 mmHg (10/19 0537) SpO2:  [92 %-98 %] 92 % (10/19 0537) Last BM Date: 01/28/12  Intake/Output from previous day: 10/18 0701 - 10/19 0700 In: 4120.5 [P.O.:360; I.V.:3440.5; IV Piggyback:320] Out: 3300 [Urine:3300] Intake/Output this shift:    General appearance: alert, cooperative, appears stated age and no distress Resp: clear to auscultation bilaterally Cardio: regular rate and rhythm GI: soft, non-tender; bowel sounds normal; no masses  Lab Results:  Basename 01/30/12 0536 01/28/12 0504 01/27/12 1542  WBC 17.8* 19.0* 17.1*  HGB 10.8* 9.7* 9.9*  HCT 33.1* 29.8* 30.5*  PLT 619* 577* 591*   BMET  Basename 01/30/12 0536 01/29/12 0454 01/28/12 0504  NA 137 139 138  K 3.6 3.7 4.1  CL 98 102 103  CO2 28 27 26   GLUCOSE 183* 125* 258*  BUN 10 8 10   CREATININE 0.59 0.61 0.52  CALCIUM 9.7 9.3 9.0   LFT  Basename 01/30/12 0536  PROT 6.9  ALBUMIN 3.2*  AST 16  ALT 9  ALKPHOS 77  BILITOT 0.6  BILIDIR --  IBILI --   PT/INR  Basename 01/28/12 0504  LABPROT 14.1  INR 1.10   Hepatitis Panel No results found for this basename: HEPBSAG,HCVAB,HEPAIGM,HEPBIGM in the last 72 hours C-Diff No results found for this basename: CDIFFTOX:3 in the last 72 hours Fecal Lactopherrin No results found for this basename: FECLLACTOFRN in the last 72 hours  Studies/Results: Ct Head Wo Contrast  01/29/2012  *RADIOLOGY REPORT*  Clinical Data: Syncope.  Nausea and vomiting.  CT HEAD WITHOUT CONTRAST  Technique:  Contiguous axial images were obtained from the base of the skull through the vertex without contrast.   Comparison: MRI brain without contrast 06/08/2003.  Findings: Remote anterior right frontal encephalomalacia is stable. Additional white matter changes within the right external capsule and posterior limb of the internal capsule are stable. Periventricular and subcortical white matter hypoattenuation bilaterally corresponds with the prior study.  Bilateral lacunar infarcts in the caudate heads are new, but appear remote.  Moderate generalized atrophy is stable.  A new medial inferior right cerebellar non hemorrhagic infarct is present.  There is effacement of the sulci.  The ventricles are of normal size.  No significant extra-axial fluid collections are present.  A fluid collection is present in the left maxillary sinus.  The remaining paranasal sinuses and to the mastoid air cells are clear.  IMPRESSION:  1.  New right inferior medial cerebellar non hemorrhagic infarct. 2.  Stable encephalomalacia of the anterior right frontal lobe. 3.  Lacunar infarcts of the caudate heads bilaterally appear remote, but are new since 2005. 4.  Otherwise stable atrophy and diffuse white matter disease. 5.  Left maxillary sinusitis.   Original Report Authenticated By: Jamesetta Orleans. MATTERN, M.D.     Medications: I have reviewed the patient's current medications.  Assessment/Plan: NAUSEA MOST LIKELY DUE TO ACUTE POSTERIOR CVA. NO BM SINCE ADMISSION.  PLAN: 1. CONTINUE ZOFRAN ND REGLAN 2. INCREASE MIRALAX TO BID. 3. CONTINUE PROTONIX. 4. CONSIDER ADDING LOW DOSE ASA NOW THAT PT IS NOT ON COUMADIN. DISCUSSED WITH PT.   LOS: 3 days   Azie Mcconahy  Kathlee Barnhardt 01/30/2012, 12:00 PM

## 2012-01-31 ENCOUNTER — Inpatient Hospital Stay (HOSPITAL_COMMUNITY): Payer: Medicare Other

## 2012-01-31 DIAGNOSIS — K5731 Diverticulosis of large intestine without perforation or abscess with bleeding: Secondary | ICD-10-CM

## 2012-01-31 LAB — CBC WITH DIFFERENTIAL/PLATELET
Basophils Relative: 0 % (ref 0–1)
Eosinophils Absolute: 0.5 10*3/uL (ref 0.0–0.7)
Eosinophils Relative: 2 % (ref 0–5)
Lymphs Abs: 4.1 10*3/uL — ABNORMAL HIGH (ref 0.7–4.0)
MCH: 29.9 pg (ref 26.0–34.0)
MCHC: 32.5 g/dL (ref 30.0–36.0)
MCV: 92.1 fL (ref 78.0–100.0)
Monocytes Relative: 9 % (ref 3–12)
Platelets: 599 10*3/uL — ABNORMAL HIGH (ref 150–400)
RBC: 3.41 MIL/uL — ABNORMAL LOW (ref 3.87–5.11)

## 2012-01-31 LAB — LIPID PANEL
LDL Cholesterol: 63 mg/dL (ref 0–99)
Triglycerides: 177 mg/dL — ABNORMAL HIGH (ref <150)
VLDL: 35 mg/dL (ref 0–40)

## 2012-01-31 LAB — GLUCOSE, CAPILLARY

## 2012-01-31 MED ORDER — GABAPENTIN 100 MG PO CAPS
100.0000 mg | ORAL_CAPSULE | Freq: Three times a day (TID) | ORAL | Status: DC
  Administered 2012-01-31 – 2012-02-01 (×3): 100 mg via ORAL
  Filled 2012-01-31 (×4): qty 1

## 2012-01-31 MED ORDER — SODIUM CHLORIDE 0.9 % IJ SOLN
INTRAMUSCULAR | Status: AC
  Administered 2012-01-31: 10 mL
  Filled 2012-01-31: qty 3

## 2012-01-31 MED ORDER — INSULIN GLARGINE 100 UNIT/ML ~~LOC~~ SOLN
20.0000 [IU] | Freq: Every day | SUBCUTANEOUS | Status: DC
  Administered 2012-01-31: 20 [IU] via SUBCUTANEOUS

## 2012-01-31 NOTE — Progress Notes (Addendum)
Subjective: Less nausea on scopolamine patch. 8 about half her meals. Feels less dizzy, but is still quite weak. No cough, no fever, no shortness of breath  Objective: Vital signs in last 24 hours: Filed Vitals:   01/30/12 2033 01/30/12 2036 01/31/12 0446 01/31/12 1344  BP:  148/75 150/78 135/66  Pulse:  74 71 55  Temp:  98 F (36.7 C) 97.7 F (36.5 C) 97.3 F (36.3 C)  TempSrc:  Oral Oral   Resp:  18 20 20   Height:      Weight:      SpO2: 97% 96% 97% 97%   Weight change:   Intake/Output Summary (Last 24 hours) at 01/31/12 1533 Last data filed at 01/31/12 1230  Gross per 24 hour  Intake 2187.5 ml  Output    951 ml  Net 1236.5 ml   General: Comfortable lying supine in bed HEENT: No nystagmus Lungs clear to auscultation bilaterally without wheeze rhonchi or rales Cardiovascular irregularly irregular without murmurs gallops rubs Abdomen soft nontender nondistended Extremities no clubbing cyanosis or edema Neurologic: Cranial nerves intact. Finger to nose is normal. Strength 5 out of 5. Sensation intact. Did not test gait.  Lab Results: Basic Metabolic Panel:  Lab 01/30/12 1610 01/29/12 0454 01/28/12 0504  NA 137 139 --  K 3.6 3.7 --  CL 98 102 --  CO2 28 27 --  GLUCOSE 183* 125* --  BUN 10 8 --  CREATININE 0.59 0.61 --  CALCIUM 9.7 9.3 --  MG -- -- 1.0*  PHOS -- -- --   Liver Function Tests:  Lab 01/30/12 0536 01/28/12 0504  AST 16 16  ALT 9 12  ALKPHOS 77 82  BILITOT 0.6 0.3  PROT 6.9 6.8  ALBUMIN 3.2* 3.4*    Lab 01/28/12 0810 01/27/12 1542  LIPASE 9* 16  AMYLASE -- --   No results found for this basename: AMMONIA:2 in the last 168 hours CBC:  Lab 01/30/12 0536 01/28/12 0504 01/27/12 1542  WBC 17.8* 19.0* --  NEUTROABS -- -- 11.1*  HGB 10.8* 9.7* --  HCT 33.1* 29.8* --  MCV 91.9 92.3 --  PLT 619* 577* --   Cardiac Enzymes:  Lab 01/28/12 0505 01/27/12 2356 01/27/12 1812  CKTOTAL -- -- --  CKMB -- -- --  CKMBINDEX -- -- --  TROPONINI  3.67* 3.75* 3.62*   BNP: No results found for this basename: PROBNP:3 in the last 168 hours D-Dimer: No results found for this basename: DDIMER:2 in the last 168 hours CBG:  Lab 01/31/12 1144 01/31/12 0729 01/30/12 2013 01/30/12 1628 01/30/12 1146 01/30/12 0745  GLUCAP 204* 217* 318* 215* 227* 192*   Hemoglobin A1C: No results found for this basename: HGBA1C in the last 168 hours Fasting Lipid Panel:  Lab 01/31/12 0603  CHOL 142  HDL 44  LDLCALC 63  TRIG 177*  CHOLHDL 3.2  LDLDIRECT --   Thyroid Function Tests:  Lab 01/27/12 1812  TSH 1.307  T4TOTAL --  FREET4 --  T3FREE --  THYROIDAB --   Coagulation:  Lab 01/28/12 0504  LABPROT 14.1  INR 1.10   Micro Results: Recent Results (from the past 240 hour(s))  MRSA PCR SCREENING     Status: Normal   Collection Time   01/27/12 10:35 PM      Component Value Range Status Comment   MRSA by PCR NEGATIVE  NEGATIVE Final    Studies/Results: US Carotid Duplex Bilateral  01/31/2012  *RADIOLOGY REPORT*  Clinical Data: Cerebellar stroke, history  of hypertension, syncopal episode, CAD, diabetes  BILATERAL CAROTID DUPLEX ULTRASOUND  Technique: Gray scale imaging, color Doppler and duplex ultrasound was performed of bilateral carotid and vertebral arteries in the neck.  Comparison:  Head CT - 01/29/2012  Criteria:  Quantification of carotid stenosis is based on velocity parameters that correlate the residual internal carotid diameter with NASCET-based stenosis levels, using the diameter of the distal internal carotid lumen as the denominator for stenosis measurement.  The following velocity measurements were obtained:                   PEAK SYSTOLIC/END DIASTOLIC RIGHT ICA:                        96/12cm/sec CCA:                        113/14cm/sec SYSTOLIC ICA/CCA RATIO:     0.85 DIASTOLIC ICA/CCA RATIO:    0.91 ECA:                        141cm/sec  LEFT ICA:                        92/16cm/sec CCA:                        121/20cm/sec  SYSTOLIC ICA/CCA RATIO:     0.76 DIASTOLIC ICA/CCA RATIO:    0.84 ECA:                        122cm/sec  Findings:  RIGHT CAROTID ARTERY: There is a very minimal amount of eccentric echogenic plaque within the right carotid bulb (image 17) which does not result in elevated peak systolic velocities within the right internal carotid artery to suggest hemodynamically significant stenosis.  RIGHT VERTEBRAL ARTERY:  Antegrade flow  LEFT CAROTID ARTERY: There is a very minimal amount of intimal wall thickening within the left carotid bulb (image 52) and eccentric echogenic shadowing plaque within the proximal aspect of the left internal carotid artery (image 62) which does not result in elevated peak systolic velocities within the left internal carotid artery to suggest a hemodynamically significant stenosis.  LEFT VERTEBRAL ARTERY:  Antegrade flow  IMPRESSION:  Minimal amount of bilateral atherosclerotic plaque, left subjectively greater than right, not resulting in hemodynamically significant stenosis.   Original Report Authenticated By: Waynard Reeds, M.D.    Scheduled Meds:    . antiseptic oral rinse  15 mL Mouth Rinse BID  . aspirin  81 mg Oral Daily  . citalopram  10 mg Oral Daily  . ezetimibe  10 mg Oral q1800  . gabapentin  300 mg Oral TID  . heparin  5,000 Units Subcutaneous Q8H  . insulin aspart  0-20 Units Subcutaneous TID WC  . insulin aspart  0-5 Units Subcutaneous QHS  . insulin glargine  10 Units Subcutaneous QHS  . irbesartan  300 mg Oral Daily  . metoprolol tartrate  25 mg Oral BID  . pantoprazole  40 mg Oral Daily  . scopolamine  1 patch Transdermal Q72H  . sodium chloride  3 mL Intravenous Q12H  . DISCONTD: amLODipine  5 mg Oral QHS  . DISCONTD: metoCLOPramide (REGLAN) injection  10 mg Intravenous Q6H  . DISCONTD: pantoprazole (PROTONIX) IV  40 mg Intravenous Daily  . DISCONTD: piperacillin-tazobactam (ZOSYN)  IV  3.375 g Intravenous Q8H  . DISCONTD: polyethylene glycol  17 g  Oral BID   Continuous Infusions:    . 0.9 % NaCl with KCl 20 mEq / L 50 mL/hr at 01/31/12 0337   PRN Meds:.colchicine, ondansetron (ZOFRAN) IV Assessment/Plan: Principal Problem:  *Cerebellar stroke, acute Active Problems:  Nausea and vomiting  IDDM  HYPERTENSION  Atrial fibrillation  HYPERCHOLESTEROLEMIA  IIA  CLL (chronic lymphocytic leukemia)  Elevated troponin, without evidence of MI.  Hypertension  Patient's symptoms improved on scopolamine. No bleeding on aspirin. Await physical therapy evaluation. I suspect she will require short-term skilled nursing facility placement. I've discussed this possibility with the patient and son were agreeable at this time. Will increase Lantus further.  Updated son   LOS: 4 days   Lezlee Gills L 01/31/2012, 3:33 PM

## 2012-02-01 LAB — GLUCOSE, CAPILLARY: Glucose-Capillary: 152 mg/dL — ABNORMAL HIGH (ref 70–99)

## 2012-02-01 MED ORDER — WARFARIN - PHYSICIAN DOSING INPATIENT
Freq: Every day | Status: DC
  Administered 2012-02-01: 18:00:00

## 2012-02-01 MED ORDER — GABAPENTIN 100 MG PO CAPS: 100.0000 mg | ORAL_CAPSULE | Freq: Every day | ORAL | Status: DC

## 2012-02-01 MED ORDER — WARFARIN SODIUM 1 MG PO TABS
1.5000 mg | ORAL_TABLET | Freq: Every day | ORAL | Status: DC
  Administered 2012-02-01: 1.5 mg via ORAL
  Filled 2012-02-01: qty 2

## 2012-02-01 MED ORDER — ENSURE PUDDING PO PUDG
1.0000 | Freq: Three times a day (TID) | ORAL | Status: DC
  Administered 2012-02-01: 1 via ORAL

## 2012-02-01 MED ORDER — INSULIN GLARGINE 100 UNIT/ML ~~LOC~~ SOLN
24.0000 [IU] | Freq: Every day | SUBCUTANEOUS | Status: DC
  Administered 2012-02-01: 24 [IU] via SUBCUTANEOUS

## 2012-02-01 NOTE — Clinical Social Work Psychosocial (Signed)
     Clinical Social Work Department BRIEF PSYCHOSOCIAL ASSESSMENT 02/01/2012  Patient:  [name], [name]     Account Number:  [id number]     Admit date:  01/27/2012  Clinical Social Worker:  Robin Searing  Date/Time:  02/01/2012 11:41 AM  Referred by:  Physician  Date Referred:  02/01/2012 Referred for  SNF Placement   Other Referral:   Interview type:  Other - See comment Other interview type:   Spoke with patient and her son via phone Loraine Leriche) [phone]    PSYCHOSOCIAL DATA Living Status:  ALONE Admitted from facility:   Level of care:   Primary support name:  son Primary support relationship to patient:  FAMILY Degree of support available:   good- has a friend/caregiver who is with her @ 4 hours daily-  [place]    CURRENT CONCERNS Current Concerns  Post-Acute Placement   Other Concerns:    SOCIAL WORK ASSESSMENT / PLAN Based on conversation with son patient only has a caregiver about 4 hours a day- she may benefit from a SNF stay for some rehabiliative therapies (s/p CVA)  Patient and son are agreeable to this option to help her gain independence and safety prior to returning home  Will ask MD for PT eval   Assessment/plan status:  Other - See comment Other assessment/ plan:   PT eval  FL2 and PASARR completion for ?SNF   Information/referral to community resources:   SNF  [facility]    PATIENTS/FAMILYS RESPONSE TO PLAN OF CARE: Patient and son are receptive to above plans-

## 2012-02-01 NOTE — Progress Notes (Signed)
Subjective; Patient continues to complain of nausea. She denies vomiting. She also denies vertigo she has not been out of bed. She says her bowels moved. She denies abdominal pain. She says she has poor appetite. No melena or rectal bleeding reported. Objective; BP 149/76  Pulse 66  Temp 98.3 F (36.8 C) (Oral)  Resp 20  Ht 5\' 4"  (1.626 m)  Wt 161 lb 9.6 oz (73.3 kg)  BMI 27.74 kg/m2  SpO2 95% Patient is alert but very quiet which is not like her. Conjunctiva is pink. Sclera is nonicteric. Pupils are equal and reactive to light. Abdomen is full but soft and nontender without organomegaly or masses. Lab data; CBC from 01/31/2012. WBC 20.3, H&H 10.2 and 31.4 and platelet count 599K. Assessment; #1. Nausea. Acute nausea would appear to be secondary to CVA but chronic nausea either secondary to medications or depression. She had normal gastric emptying study 5 weeks ago. Will treat nausea symptomatically. #2. Anemia. H&H is gradually coming up. She had colonic diverticula bleed 2 weeks ago. Presently on low-dose aspirin because of acute CVA. If she needs to be another antiplatelet agent or anticoagulant we will have to monitor very close. #3. Leukocytosis appears to be chronic. She has been evaluated by Dr. Burna Cash in the past and now she has developed thrombocytosis which may be due to acute illness. #4. Acute nonhemorrhagic CVA. Recommendations; Continue PPI. Will monitor H&H. Discussed with Dr. Lendell Caprice she may have to go to a nursing home for rehabilitation.

## 2012-02-01 NOTE — Consult Note (Signed)
[facility] [name] A. Amanda Pilgrim, MD     [email or web address]          [name] is an 78 y.o. female.  HPI: The patient is an 78 year old white female who presents with the acute onset of vertiginous symptoms described as a spinning sensation. This happened acutely while she bent over to pick up something a few days ago. She reportedly also had earache a couple days before then. The patient somewhat tends to develop intense nausea and vomiting which resulted in the patient seek and medical attention. She however denies diplopia, dysarthria, dysphagia, hemiparesis or focal numbness. She does report having some mild headaches. The patient has been on chronic warfarin therapy because of atrial fibrillation. Unfortunately, she had a GI bleed couple weeks ago was taken off the warfarin. This appears a bit name mild to moderate type bleed. The patient has had a poor appetite due to the intense nausea and vomiting. She was given a scopolamine patch on Saturday which appears to be thing that has helped the intense nausea and vomiting.  Past Medical History  Diagnosis Date  . HTN (hypertension)   . Coronary atherosclerosis of unspecified type of vessel, native or graft     a. remote CABG in 1999 with Dr. Laneta Simmers. b. s/p PCI with stent to LCX in 2009 (Platinum protocol study). c. NSTEMI 10/2011 s/p BMS to SVG->diagonal.  . Personal history of other diseases of digestive system   . Gout, unspecified     "have had it in my hands and my feet"  . Unspecified hereditary and idiopathic peripheral neuropathy   . Esophageal reflux   . Anemia   . GI bleed   . Leukocytosis 03/16/2011  . Hyperlipidemia     Statin intolerant  . Pancreas disorder     prior pancreatectomy in 2011 complicated by leak requiring prolong and repeated drainage and subsequent pancreatic stent  . Other chronic pulmonary heart diseases   . Type I (juvenile type) diabetes mellitus without mention of complication, not stated as  uncontrolled     Previously on insulin pump  . Permanent atrial fibrillation   . Anginal pain 11/24/11    "1st time"  . Exertional dyspnea 11/24/11  . Depression   . 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 CLL (chronic lymphocytic leukemia)     seeing Dr. [name]  . Myocardial infarction 1999    "on the verge of one"  . NSTEMI (non-ST elevated myocardial infarction) 10/2011    /H&P    Past Surgical History  Procedure Date  . Oophorectomy 1970's    w hysterectomy and bladder tacking procedure  . Belpharoptosis repair     right upper eyelid  . Esophagogastroduodenoscopy 07/11/2010  . Colonoscopy 10/20/06  . Tonsillectomy 1947  . Appendectomy 1970's?  . Abdominal hysterectomy 1970's  . Coronary angioplasty with stent placement 10/2011    "makes total of 4"; 20-30% ostial left main stenosis; occluded ostial LAD supplied by IMA graft, 20-30% scattered LCx stenoses with widely patent stent and 20% narrowing; diffuse RCA luminal irregularities up to 20-30%; SVG-diagonal patent, but proximal 90% stenosis s/p BMS; patent LIMA-LAD; LVEF 55-65%.  . Coronary angioplasty with stent placement 2009    LCX  . Cataract extraction w/ intraocular lens  implant, bilateral ~ 2003  . Coronary artery bypass graft 1999    LIMA to LAD, SVG to the DX  . Pancreatectomy 2011    c/b leak requiring multiple drainage attempts and stent placement), /H&P  .  Cardiac surgery   . Colonoscopy 01/20/2012    Procedure: COLONOSCOPY;  Surgeon: Malissa Hippo, MD;  Location: [facility];  Service: Endoscopy;  Laterality: N/A;    Family History  Problem Relation Age of Onset  . Stroke Mother   . Lung cancer Father   . Healthy Son     Social History:  reports that she has never smoked. She has never used smokeless tobacco. She reports that she does not drink alcohol or use illicit drugs.  Allergies:  Allergies  Allergen Reactions  . Codeine Nausea And Vomiting  . Ivp Dye (Iodinated  Diagnostic Agents) Other (See Comments)    Burst veins in her arm.   . Morphine Other (See Comments)    Not in Right State of Mind.   . Prednisone Other (See Comments)    Patient became delirious/combative when given 60mg  dose  . Statins Other (See Comments)    Leg Cramps.     Medications: Prior to Admission medications   Medication Sig Start Date End Date Taking? Authorizing Provider  amLODipine (NORVASC) 10 MG tablet Take 5 mg by mouth at bedtime. 11/14/11  Yes Dayna N Dunn, PA  Cholecalciferol (VITAMIN D3) 50000 UNITS CAPS Take 50,000 Units by mouth every 7 (seven) days. TAKES ON THURSDAYS   Yes Historical Provider, MD  colchicine 0.6 MG tablet Take 0.6 mg by mouth daily as needed. Gout Flare   Yes Historical Provider, MD  colesevelam (WELCHOL) 625 MG tablet Take 625 mg by mouth 3 (three) times daily. 11/14/11 11/13/12 Yes Dayna N Dunn, PA  ezetimibe (ZETIA) 10 MG tablet Take 10 mg by mouth daily. 11/14/11 11/13/12 Yes Dayna N Dunn, PA  gabapentin (NEURONTIN) 300 MG capsule Take 300 mg by mouth 3 (three) times daily.    Yes Historical Provider, MD  insulin aspart (NOVOLOG) 100 UNIT/ML injection Inject 10-14 Units into the skin 3 (three) times daily as needed. FlexPen - Inject into skin subcutaneously. 10 units with breakfast, 10 units with lunch, and 12 units with supper. If sugar is running high patient may take an additional 2-4 units. 11/14/11  Yes Dayna N Dunn, PA  insulin glargine (LANTUS) 100 UNIT/ML injection Solostar Pen - Inject into skin subcutaneously. 44 units daily in AM and 26 units at bedtime. 11/14/11  Yes Dayna N Dunn, PA  metoprolol tartrate (LOPRESSOR) 25 MG tablet Take 25 mg by mouth 2 (two) times daily. 11/14/11 11/13/12 Yes Dayna N Dunn, PA  olmesartan (BENICAR) 40 MG tablet Take 40 mg by mouth daily. 11/14/11  Yes Dayna N Dunn, PA  pantoprazole (PROTONIX) 20 MG tablet Take 20 mg by mouth every morning.    Yes Historical Provider, MD  polyethylene glycol (MIRALAX / GLYCOLAX) packet Take  17 g by mouth daily. 11/14/11  Yes Dayna N Dunn, PA  escitalopram (LEXAPRO) 5 MG tablet Take 5 mg by mouth Daily. 01/15/12   Historical Provider, MD  febuxostat (ULORIC) 40 MG tablet Take 40 mg by mouth daily.    Historical Provider, MD  nitroGLYCERIN (NITROSTAT) 0.4 MG SL tablet Place 1 tablet (0.4 mg total) under the tongue every 5 (five) minutes x 3 doses as needed for chest pain. 11/14/11 11/13/12  Dayna N Dunn, PA  ondansetron (ZOFRAN) 4 MG tablet Take 1 tablet (4 mg total) by mouth every 8 (eight) hours as needed for nausea. 12/28/11   Malissa Hippo, MD  PHENADOZ 25 MG suppository Place 1 suppository rectally Once daily as needed. For nausea and/or vomiting 11/23/11  Historical Provider, MD   Scheduled Meds:   . antiseptic oral rinse  15 mL Mouth Rinse BID  . aspirin  81 mg Oral Daily  . citalopram  10 mg Oral Daily  . ezetimibe  10 mg Oral q1800  . gabapentin  100 mg Oral TID  . heparin  5,000 Units Subcutaneous Q8H  . insulin aspart  0-20 Units Subcutaneous TID WC  . insulin aspart  0-5 Units Subcutaneous QHS  . insulin glargine  20 Units Subcutaneous QHS  . irbesartan  300 mg Oral Daily  . metoprolol tartrate  25 mg Oral BID  . pantoprazole  40 mg Oral Daily  . scopolamine  1 patch Transdermal Q72H  . sodium chloride  3 mL Intravenous Q12H  . sodium chloride      . DISCONTD: gabapentin  300 mg Oral TID  . DISCONTD: insulin glargine  10 Units Subcutaneous QHS   Continuous Infusions:   . 0.9 % NaCl with KCl 20 mEq / L 50 mL/hr at 01/31/12 2320   PRN Meds:.colchicine, ondansetron (ZOFRAN) IV   Results for orders placed during the hospital encounter of 01/27/12 (from the past 48 hour(s))  GLUCOSE, CAPILLARY     Status: Abnormal   Collection Time   01/30/12  4:28 PM      Component Value Range Comment   Glucose-Capillary 215 (*) 70 - 99 mg/dL    Comment 1 Notify RN      Comment 2 Documented in Chart     GLUCOSE, CAPILLARY     Status: Abnormal   Collection Time   01/30/12   8:13 PM      Component Value Range Comment   Glucose-Capillary 318 (*) 70 - 99 mg/dL   LIPID PANEL     Status: Abnormal   Collection Time   01/31/12  6:03 AM      Component Value Range Comment   Cholesterol 142  0 - 200 mg/dL    Triglycerides 409 (*) <150 mg/dL    HDL 44  >81 mg/dL    Total CHOL/HDL Ratio 3.2      VLDL 35  0 - 40 mg/dL    LDL Cholesterol 63  0 - 99 mg/dL   GLUCOSE, CAPILLARY     Status: Abnormal   Collection Time   01/31/12  7:29 AM      Component Value Range Comment   Glucose-Capillary 217 (*) 70 - 99 mg/dL    Comment 1 Notify RN     GLUCOSE, CAPILLARY     Status: Abnormal   Collection Time   01/31/12 11:44 AM      Component Value Range Comment   Glucose-Capillary 204 (*) 70 - 99 mg/dL    Comment 1 Notify RN     CBC WITH DIFFERENTIAL     Status: Abnormal   Collection Time   01/31/12  3:25 PM      Component Value Range Comment   WBC 20.3 (*) 4.0 - 10.5 K/uL    RBC 3.41 (*) 3.87 - 5.11 MIL/uL    Hemoglobin 10.2 (*) 12.0 - 15.0 g/dL    HCT 19.1 (*) 47.8 - 46.0 %    MCV 92.1  78.0 - 100.0 fL    MCH 29.9  26.0 - 34.0 pg    MCHC 32.5  30.0 - 36.0 g/dL    RDW 29.5  62.1 - 30.8 %    Platelets 599 (*) 150 - 400 K/uL    Neutrophils Relative 68  43 -  77 %    Neutro Abs 13.7 (*) 1.7 - 7.7 K/uL    Lymphocytes Relative 20  12 - 46 %    Lymphs Abs 4.1 (*) 0.7 - 4.0 K/uL    Monocytes Relative 9  3 - 12 %    Monocytes Absolute 1.8 (*) 0.1 - 1.0 K/uL    Eosinophils Relative 2  0 - 5 %    Eosinophils Absolute 0.5  0.0 - 0.7 K/uL    Basophils Relative 0  0 - 1 %    Basophils Absolute 0.1  0.0 - 0.1 K/uL    WBC Morphology TOXIC GRANULATION   VACUOLATED NEUTROPHILS  GLUCOSE, CAPILLARY     Status: Abnormal   Collection Time   01/31/12  3:59 PM      Component Value Range Comment   Glucose-Capillary 194 (*) 70 - 99 mg/dL    Comment 1 Notify RN     GLUCOSE, CAPILLARY     Status: Abnormal   Collection Time   01/31/12  8:07 PM      Component Value Range Comment    Glucose-Capillary 196 (*) 70 - 99 mg/dL   GLUCOSE, CAPILLARY     Status: Abnormal   Collection Time   02/01/12  7:26 AM      Component Value Range Comment   Glucose-Capillary 258 (*) 70 - 99 mg/dL   GLUCOSE, CAPILLARY     Status: Abnormal   Collection Time   02/01/12 11:19 AM      Component Value Range Comment   Glucose-Capillary 223 (*) 70 - 99 mg/dL     US Carotid Duplex Bilateral  01/31/2012  *RADIOLOGY REPORT*  Clinical Data: Cerebellar stroke, history of hypertension, syncopal episode, CAD, diabetes  BILATERAL CAROTID DUPLEX ULTRASOUND  Technique: Gray scale imaging, color Doppler and duplex ultrasound was performed of bilateral carotid and vertebral arteries in the neck.  Comparison:  Head CT - 01/29/2012  Criteria:  Quantification of carotid stenosis is based on velocity parameters that correlate the residual internal carotid diameter with NASCET-based stenosis levels, using the diameter of the distal internal carotid lumen as the denominator for stenosis measurement.  The following velocity measurements were obtained:                   PEAK SYSTOLIC/END DIASTOLIC RIGHT ICA:                        96/12cm/sec CCA:                        113/14cm/sec SYSTOLIC ICA/CCA RATIO:     0.85 DIASTOLIC ICA/CCA RATIO:    0.91 ECA:                        141cm/sec  LEFT ICA:                        92/16cm/sec CCA:                        121/20cm/sec SYSTOLIC ICA/CCA RATIO:     0.76 DIASTOLIC ICA/CCA RATIO:    0.84 ECA:                        122cm/sec  Findings:  RIGHT CAROTID ARTERY: There is a very minimal amount of eccentric echogenic plaque within the right carotid bulb (  image 17) which does not result in elevated peak systolic velocities within the right internal carotid artery to suggest hemodynamically significant stenosis.  RIGHT VERTEBRAL ARTERY:  Antegrade flow  LEFT CAROTID ARTERY: There is a very minimal amount of intimal wall thickening within the left carotid bulb (image 52) and eccentric  echogenic shadowing plaque within the proximal aspect of the left internal carotid artery (image 62) which does not result in elevated peak systolic velocities within the left internal carotid artery to suggest a hemodynamically significant stenosis.  LEFT VERTEBRAL ARTERY:  Antegrade flow  IMPRESSION:  Minimal amount of bilateral atherosclerotic plaque, left subjectively greater than right, not resulting in hemodynamically significant stenosis.   Original Report Authenticated By: Waynard Reeds, M.D.     Review of Systems  Eyes: Negative.   Respiratory: Negative.   Cardiovascular: Negative.   Gastrointestinal: Positive for nausea and vomiting.  Musculoskeletal: Negative.   Skin: Negative.   Neurological: Positive for weakness and headaches.  Endo/Heme/Allergies: Negative.   Psychiatric/Behavioral: Negative.    Blood pressure 149/76, pulse 66, temperature 98.3 F (36.8 C), temperature source Oral, resp. rate 20, height 5\' 4"  (1.626 m), weight 73.3 kg (161 lb 9.6 oz), SpO2 95.00%. Physical Exam  GENERAL: This is an average weight lady who appears to be in discomfort from being nauseous.  HEENT: Retro-palatal space is fine.   ABDOMEN: soft  EXTREMITIES: No edema   BACK: Unremarkable.  SKIN: Normal by inspection.    MENTAL STATUS: Alert and oriented. Speech, language and cognition are generally intact. Judgment and insight normal.   CRANIAL NERVES: Pupils are equal, round and reactive to light and accomodation; extra ocular movements are full with mild nonsustained torsional nystagmus to the right, visual fields are full; upper and lower facial muscles are normal in strength and symmetric, there is no flattening of the nasolabial folds; tongue is midline; uvula is midline; shoulder elevation is normal.  MOTOR: Normal tone, bulk and strength; no pronator drift.  COORDINATION: No rest tremor; no intention tremor; no postural tremor; no bradykinesia. Finger to nose on the right showed  possible mild dysmetria. Left side is normal.  REFLEXES: Deep tendon reflexes are symmetrical and normal. Babinski reflexes are flexor on the left and extensor on the right.   SENSATION: Normal to light touch.  Head CT scan is reviewed in person. There is hypodensity involving a large portion of the cerebellum on the right side. There is some edema but the fourth ventricle is relatively patent. No evidence of hydrocephalus. There is a moderate-sized encephalomalacia involving the right frontal area in a somewhat watershed distribution. This is most consistent with a remote infarct.  Assessment/Plan: Acute right cerebellar infarct. The size of the infarct is actually larger indicated this is a large vessel problem likely related to atrial fibrillation. Fortunately, the patient has not had significant swelling causing obstruction of the fourth ventricle. The patient is at a dramatically high risk for recurrent events given her age, history of hypertension and history of diabetes along now with her second infarct. Unfortunate, she had a bleeding event 2 weeks ago from the GI tract. Additionally, she does have a large infarct now and is at risk of hemorrhagic transformation. The patient will need to be on chronic warfarin therapy given her high risk of her recurrence. However to reduce the risk of hemorrhagic transformation and any additional GI complications, I would suggest that anticoagulation be started 4 weeks after her current stroke.  Batu Cassin 02/01/2012, 1:51 PM

## 2012-02-01 NOTE — Evaluation (Signed)
Physical Therapy Evaluation Patient Details Name: [name] MRN: [id number] DOB: [birth year] Today's Date: 02/01/2012 Time: 0840-     PT Assessment / Plan / Recommendation Clinical Impression  Pt with decreased strength and activity tolerance who will benefit from skilled therapy to improve safety and I of mobility    PT Assessment  Patient needs continued PT services    Follow Up Recommendations  Other (comment) (SNF)    Does the patient have the potential to tolerate intense rehabilitation   No, Recommend SNF  Barriers to Discharge  none      Equipment Recommendations  None recommended by PT    Recommendations for Other Services     Frequency Min 5X/week    Precautions / Restrictions Precautions Precautions: Fall Restrictions Weight Bearing Restrictions: No         Mobility  Bed Mobility Bed Mobility: Rolling Right;Rolling Left;Supine to Sit;Sitting - Scoot to [place] of Bed;Scooting to [facility] Rolling Right: 4: Min assist Rolling Left: 4: Min guard Supine to Sit: 2: Max assist Sitting - Scoot to [place] of Bed: 3: Mod assist Scooting to [facility]: 3: Mod assist Transfers Transfers: Sit to Stand Sit to Stand: 3: Mod assist Details for Transfer Assistance: Pt able to stand for two minutes then stated she needed to sit down due to fatigue and feeling light headed Ambulation/Gait Ambulation/Gait Assistance: Not tested (comment) Assistive device: Rolling walker Ambulation/Gait Assistance Details: used to help balance with standing    Shoulder Instructions     Exercises General Exercises - Lower Extremity Ankle Circles/Pumps: Strengthening;10 reps;Both Quad Sets: Strengthening;Both;10 reps Gluteal Sets: Strengthening;Both;10 reps (as well as bridging.) Heel Slides:  (SLR B 10) Straight Leg Raises: Sidelying;Both;10 reps   PT Diagnosis: Difficulty walking;Generalized weakness;Acute pain  PT Problem List: Decreased strength;Decreased activity tolerance;Decreased  balance;Pain PT Treatment Interventions: Gait training;Functional mobility training;Therapeutic exercise;Balance training;Therapeutic activities   PT Goals Acute Rehab PT Goals PT Goal Formulation: With patient Time For Goal Achievement: 02/03/12 Potential to Achieve Goals: Good Pt will Roll Supine to Right Side: with supervision PT Goal: Rolling Supine to Right Side - Progress: Goal set today Pt will Roll Supine to Left Side: with supervision PT Goal: Rolling Supine to Left Side - Progress: Goal set today Pt will go Supine/Side to Sit: with mod assist PT Goal: Supine/Side to Sit - Progress: Goal set today Pt will go Sit to Stand: with min assist PT Goal: Sit to Stand - Progress: Goal set today Pt will Stand: with supervision;3 - 5 min;with bilateral upper extremity support PT Goal: Stand - Progress: Goal set today Pt will Ambulate: 16 - 50 feet;with min assist;with rolling walker PT Goal: Ambulate - Progress: Goal set today  Visit Information  Last PT Received On: 02/01/12    Subjective Data  Subjective: Pt states that she is nauseated.   States she is weak states she had an MI in June and does not go to cardiac rehab.  Pt states that she has a caregiver that stays with her 24 hr.  Patient Stated Goal: To go home.   Prior Functioning  Home Living Lives With: Alone Available Help at Discharge: Available 24 hours/day;Personal care attendant Type of Home: Apartment Home Access: Level entry Home Layout: One level Bathroom Shower/Tub: Engineer, manufacturing systems: Standard Home Adaptive Equipment: Walker - rolling;Shower chair with back Prior Function Level of Independence: Independent with assistive device(s);Needs assistance Needs Assistance: Light Housekeeping;Meal Prep;Bathing;Dressing (Pt hired about a year and a half.) Able to Take Stairs?: No  Driving: No Vocation: Retired Musician: No difficulties Dominant Hand: Right    Cognition  Overall  Cognitive Status: Appears within functional limits for tasks assessed/performed Arousal/Alertness: Awake/alert Orientation Level: Appears intact for tasks assessed Behavior During Session: [facility] for tasks performed    Extremity/Trunk Assessment Right Lower Extremity Assessment RLE ROM/Strength/Tone: Deficits RLE ROM/Strength/Tone Deficits: decreased strength generally 4-/5 RLE Sensation: WFL - Light Touch RLE Coordination: WFL - gross/fine motor Left Lower Extremity Assessment LLE ROM/Strength/Tone: Deficits LLE ROM/Strength/Tone Deficits: decreased strength generally 4-/5 LLE Sensation: WFL - Light Touch LLE Coordination: WFL - gross/fine motor   Balance Balance Balance Assessed: Yes Static Standing Balance Static Standing - Balance Support: Bilateral upper extremity supported Static Standing - Level of Assistance: 5: Stand by assistance Static Standing - Comment/# of Minutes: 2  End of Session PT - End of Session Equipment Utilized During Treatment: Gait belt Activity Tolerance: Patient limited by fatigue Patient left: in bed;with call bell/phone within reach;with bed alarm set  GP     RUSSELL,CINDY 02/01/2012, 9:31 AM

## 2012-02-01 NOTE — Progress Notes (Signed)
Inpatient Diabetes Program Recommendations  AACE/ADA: New Consensus Statement on Inpatient Glycemic Control (2013)  Target Ranges:  Prepandial:   less than 140 mg/dL      Peak postprandial:   less than 180 mg/dL (1-2 hours)      Critically ill patients:  140 - 180 mg/dL   Reason for Visit: Hyperglycemia  Inpatient Diabetes Program Recommendations Insulin - Basal: Pt takes lantus at home, 44 units in the am and 26 units at HS. Please continue to increase total lantus until fasting glucose values are controlled. Correction (SSI): xxxxx  Note: Thank you, Lenor Coffin, RN, CNS, Diabetes Coordinator [phone])

## 2012-02-01 NOTE — Progress Notes (Signed)
Discussed with Drs. Stuckey and Raman.  Subjective: Less nausea on scopolamine patch. 8 about half her meals. Feels less dizzy, but is still quite weak. No cough, no fever, no shortness of breath  Objective: Vital signs in last 24 hours: Filed Vitals:   01/31/12 1344 01/31/12 2110 02/01/12 0459 02/01/12 1444  BP: 135/66 146/77 149/76 149/75  Pulse: 55 73 66 65  Temp: 97.3 F (36.3 C) 98.5 F (36.9 C) 98.3 F (36.8 C) 96.7 F (35.9 C)  TempSrc:  Oral Oral Oral  Resp: 20 20 20 19   Height:      Weight:      SpO2: 97% 95%  99%   Weight change:   Intake/Output Summary (Last 24 hours) at 02/01/12 1701 Last data filed at 02/01/12 1400  Gross per 24 hour  Intake 2172.51 ml  Output   1500 ml  Net 672.51 ml   General: Comfortable lying supine in bed HEENT: No nystagmus Lungs clear to auscultation bilaterally without wheeze rhonchi or rales Cardiovascular irregularly irregular without murmurs gallops rubs Abdomen soft nontender nondistended Extremities no clubbing cyanosis or edema Neurologic: Cranial nerves intact. Finger to nose is normal. Strength 5 out of 5. Sensation intact. Did not test gait.  Lab Results: Basic Metabolic Panel:  Lab 01/30/12 4098 01/29/12 0454 01/28/12 0504  NA 137 139 --  K 3.6 3.7 --  CL 98 102 --  CO2 28 27 --  GLUCOSE 183* 125* --  BUN 10 8 --  CREATININE 0.59 0.61 --  CALCIUM 9.7 9.3 --  MG -- -- 1.0*  PHOS -- -- --   Liver Function Tests:  Lab 01/30/12 0536 01/28/12 0504  AST 16 16  ALT 9 12  ALKPHOS 77 82  BILITOT 0.6 0.3  PROT 6.9 6.8  ALBUMIN 3.2* 3.4*    Lab 01/28/12 0810 01/27/12 1542  LIPASE 9* 16  AMYLASE -- --   No results found for this basename: AMMONIA:2 in the last 168 hours CBC:  Lab 01/31/12 1525 01/30/12 0536 01/27/12 1542  WBC 20.3* 17.8* --  NEUTROABS 13.7* -- 11.1*  HGB 10.2* 10.8* --  HCT 31.4* 33.1* --  MCV 92.1 91.9 --  PLT 599* 619* --   Cardiac Enzymes:  Lab 01/28/12 0505 01/27/12 2356 01/27/12  1812  CKTOTAL -- -- --  CKMB -- -- --  CKMBINDEX -- -- --  TROPONINI 3.67* 3.75* 3.62*   BNP: No results found for this basename: PROBNP:3 in the last 168 hours D-Dimer: No results found for this basename: DDIMER:2 in the last 168 hours CBG:  Lab 02/01/12 1620 02/01/12 1119 02/01/12 0726 01/31/12 2007 01/31/12 1559 01/31/12 1144  GLUCAP 152* 223* 258* 196* 194* 204*   Hemoglobin A1C: No results found for this basename: HGBA1C in the last 168 hours Fasting Lipid Panel:  Lab 01/31/12 0603  CHOL 142  HDL 44  LDLCALC 63  TRIG 177*  CHOLHDL 3.2  LDLDIRECT --   Thyroid Function Tests:  Lab 01/27/12 1812  TSH 1.307  T4TOTAL --  FREET4 --  T3FREE --  THYROIDAB --   Coagulation:  Lab 01/28/12 0504  LABPROT 14.1  INR 1.10   Micro Results: Recent Results (from the past 240 hour(s))  MRSA PCR SCREENING     Status: Normal   Collection Time   01/27/12 10:35 PM      Component Value Range Status Comment   MRSA by PCR NEGATIVE  NEGATIVE Final    Studies/Results: US Carotid Duplex Bilateral  01/31/2012  *  RADIOLOGY REPORT*  Clinical Data: Cerebellar stroke, history of hypertension, syncopal episode, CAD, diabetes  BILATERAL CAROTID DUPLEX ULTRASOUND  Technique: Wallace Cullens scale imaging, color Doppler and duplex ultrasound was performed of bilateral carotid and vertebral arteries in the neck.  Comparison:  Head CT - 01/29/2012  Criteria:  Quantification of carotid stenosis is based on velocity parameters that correlate the residual internal carotid diameter with NASCET-based stenosis levels, using the diameter of the distal internal carotid lumen as the denominator for stenosis measurement.  The following velocity measurements were obtained:                   PEAK SYSTOLIC/END DIASTOLIC RIGHT ICA:                        96/12cm/sec CCA:                        113/14cm/sec SYSTOLIC ICA/CCA RATIO:     0.85 DIASTOLIC ICA/CCA RATIO:    0.91 ECA:                        141cm/sec  LEFT ICA:                         92/16cm/sec CCA:                        121/20cm/sec SYSTOLIC ICA/CCA RATIO:     0.76 DIASTOLIC ICA/CCA RATIO:    0.84 ECA:                        122cm/sec  Findings:  RIGHT CAROTID ARTERY: There is a very minimal amount of eccentric echogenic plaque within the right carotid bulb (image 17) which does not result in elevated peak systolic velocities within the right internal carotid artery to suggest hemodynamically significant stenosis.  RIGHT VERTEBRAL ARTERY:  Antegrade flow  LEFT CAROTID ARTERY: There is a very minimal amount of intimal wall thickening within the left carotid bulb (image 52) and eccentric echogenic shadowing plaque within the proximal aspect of the left internal carotid artery (image 62) which does not result in elevated peak systolic velocities within the left internal carotid artery to suggest a hemodynamically significant stenosis.  LEFT VERTEBRAL ARTERY:  Antegrade flow  IMPRESSION:  Minimal amount of bilateral atherosclerotic plaque, left subjectively greater than right, not resulting in hemodynamically significant stenosis.   Original Report Authenticated By: Waynard Reeds, M.D.    Scheduled Meds:    . antiseptic oral rinse  15 mL Mouth Rinse BID  . citalopram  10 mg Oral Daily  . ezetimibe  10 mg Oral q1800  . feeding supplement  1 Container Oral TID BM  . gabapentin  100 mg Oral QHS  . heparin  5,000 Units Subcutaneous Q8H  . insulin aspart  0-20 Units Subcutaneous TID WC  . insulin aspart  0-5 Units Subcutaneous QHS  . insulin glargine  24 Units Subcutaneous QHS  . irbesartan  300 mg Oral Daily  . metoprolol tartrate  25 mg Oral BID  . pantoprazole  40 mg Oral Daily  . scopolamine  1 patch Transdermal Q72H  . sodium chloride  3 mL Intravenous Q12H  . sodium chloride      . DISCONTD: aspirin  81 mg Oral Daily  . DISCONTD: gabapentin  100 mg Oral TID  .  DISCONTD: insulin glargine  20 Units Subcutaneous QHS   Continuous Infusions:    . 0.9 %  NaCl with KCl 20 mEq / L 50 mL/hr at 01/31/12 2320   PRN Meds:.colchicine, ondansetron (ZOFRAN) IV Assessment/Plan: Principal Problem:  *Cerebellar stroke, acute Dr. Tedra Senegal recommends resuming warfarin with a goal INR between 1.8 and 2.2. She had been on 2-1/2 mg daily except 1.25 mg once weekly. We'll start with 1.5 mg daily for now. Stop aspirin. Dr. Dionicia Abler agreeable. We will need to monitor for GI bleed. Patient will need skilled nursing facility placement. Active Problems:  Nausea and vomiting patient is more nauseated today than yesterday. She's also groggier. Question related to Zofran. Will change Neurontin to 100 mg nightly. Add ensure supplements. Continue IV fluids. Check BMET tomorrow.  IDDM  HYPERTENSION  Atrial fibrillation  HYPERCHOLESTEROLEMIA  IIA  CLL (chronic lymphocytic leukemia)  Elevated troponin, without evidence of MI.  Hypertension    LOS: 5 days   Westley Blass L 02/01/2012, 5:01 PM

## 2012-02-01 NOTE — Clinical Social Work Placement (Signed)
     Clinical Social Work Department CLINICAL SOCIAL WORK PLACEMENT NOTE 02/01/2012  Patient:  DEISSY, GUILBERT  Account Number:  [id number] Admit date:  01/27/2012  Clinical Social Worker:  Robin Searing  Date/time:  02/01/2012 11:48 AM  Clinical Social Work is seeking post-discharge placement for this patient at the following level of care:   SKILLED NURSING   (*CSW will update this form in Epic as items are completed)   02/01/2012  Patient/family provided with [facility] Department of Clinical Social Works list of facilities offering this level of care within the geographic area requested by the patient (or if unable, by the patients family).  02/01/2012  Patient/family informed of their freedom to choose among providers that offer the needed level of care, that participate in Medicare, Medicaid or managed care program needed by the patient, have an available bed and are willing to accept the patient.  02/01/2012  Patient/family informed of [facility] ownership interest in [facility], as well as of the fact that they are under no obligation to receive care at this facility.  PASARR submitted to EDS on 02/01/2012 PASARR number received from EDS on   FL2 transmitted to all facilities in geographic area requested by pt/family on   FL2 transmitted to all facilities within larger geographic area on   Patient informed that his/her managed care company has contracts with or will negotiate with  certain facilities, including the following:     Patient/family informed of bed offers received:   Patient chooses bed at  Physician recommends and patient chooses bed at    Patient to be transferred to  on   Patient to be transferred to facility by   The following physician request were entered in Epic:   Additional Comments:

## 2012-02-02 ENCOUNTER — Ambulatory Visit (INDEPENDENT_AMBULATORY_CARE_PROVIDER_SITE_OTHER): Payer: Medicare Other | Admitting: Internal Medicine

## 2012-02-02 ENCOUNTER — Inpatient Hospital Stay
Admission: RE | Admit: 2012-02-02 | Discharge: 2012-03-18 | Disposition: A | Discharge status: Other Acute Inpatient Hospital | Payer: PRIVATE HEALTH INSURANCE | Source: Ambulatory Visit | Attending: Internal Medicine | Admitting: Internal Medicine

## 2012-02-02 DIAGNOSIS — R52 Pain, unspecified: Principal | ICD-10-CM

## 2012-02-02 DIAGNOSIS — R112 Nausea with vomiting, unspecified: Secondary | ICD-10-CM

## 2012-02-02 LAB — BASIC METABOLIC PANEL
CO2: 28 mEq/L (ref 19–32)
Calcium: 10.1 mg/dL (ref 8.4–10.5)
Chloride: 91 mEq/L — ABNORMAL LOW (ref 96–112)
Creatinine, Ser: 0.51 mg/dL (ref 0.50–1.10)
Glucose, Bld: 198 mg/dL — ABNORMAL HIGH (ref 70–99)

## 2012-02-02 LAB — PROTIME-INR
INR: 1.02 (ref 0.00–1.49)
Prothrombin Time: 13.3 seconds (ref 11.6–15.2)

## 2012-02-02 LAB — GLUCOSE, CAPILLARY
Glucose-Capillary: 187 mg/dL — ABNORMAL HIGH (ref 70–99)
Glucose-Capillary: 183 mg/dL — ABNORMAL HIGH (ref 70–99)

## 2012-02-02 MED ORDER — INSULIN ASPART 100 UNIT/ML ~~LOC~~ SOLN: 0.0000 [IU] | Freq: Three times a day (TID) | SUBCUTANEOUS | Status: DC

## 2012-02-02 MED ORDER — INSULIN GLARGINE 100 UNIT/ML ~~LOC~~ SOLN: 26.0000 [IU] | Freq: Every day | SUBCUTANEOUS | Status: DC

## 2012-02-02 MED ORDER — SCOPOLAMINE 1 MG/3DAYS TD PT72: 1.0000 | MEDICATED_PATCH | TRANSDERMAL | Status: DC

## 2012-02-02 MED ORDER — CITALOPRAM HYDROBROMIDE 10 MG PO TABS: 10.0000 mg | ORAL_TABLET | Freq: Every day | ORAL | Status: DC

## 2012-02-02 MED ORDER — ASPIRIN EC 81 MG PO TBEC
81.0000 mg | DELAYED_RELEASE_TABLET | Freq: Every day | ORAL | Status: DC
  Administered 2012-02-02: 81 mg via ORAL
  Filled 2012-02-02: qty 1

## 2012-02-02 MED ORDER — INSULIN ASPART 100 UNIT/ML ~~LOC~~ SOLN: 0.0000 [IU] | Freq: Every day | SUBCUTANEOUS | Status: DC

## 2012-02-02 MED ORDER — ENSURE PUDDING PO PUDG: 1.0000 | Freq: Three times a day (TID) | ORAL | Status: DC

## 2012-02-02 MED ORDER — ASPIRIN 81 MG PO TBEC: 81.0000 mg | DELAYED_RELEASE_TABLET | Freq: Every day | ORAL | Status: DC

## 2012-02-02 MED ORDER — GABAPENTIN 100 MG PO CAPS: 100.0000 mg | ORAL_CAPSULE | Freq: Every day | ORAL | Status: DC

## 2012-02-02 NOTE — Progress Notes (Signed)
PT Cancellation Note  Patient Details Name: [name] MRN: [id number] DOB: 1929-07-09   Cancelled Treatment:    Reason Eval/Treat Not Completed: Other (comment) Patient being d/c today per nursing   Baily Serpe ATKINSO 02/02/2012, 12:22 PM

## 2012-02-02 NOTE — Clinical Social Work Note (Signed)
CSW presented bed offers to pt who chooses [facility]. Facility notified. Pt d/c today and will transfer with RN. D/C summary faxed.  Derenda Fennel, [place] [phone]

## 2012-02-02 NOTE — Progress Notes (Signed)
Report called to [facility]. 

## 2012-02-02 NOTE — Clinical Social Work Placement (Signed)
Clinical Social Work Department CLINICAL SOCIAL WORK PLACEMENT NOTE 02/02/2012  Patient:  [name], [name]  Account Number:  [id number] Admit date:  01/27/2012  Clinical Social Worker:  Robin Searing  Date/time:  02/01/2012 11:48 AM  Clinical Social Work is seeking post-discharge placement for this patient at the following level of care:   SKILLED NURSING   (*CSW will update this form in Epic as items are completed)   02/01/2012  Patient/family provided with [facility] Department of Clinical Social Work's list of facilities offering this level of care within the geographic area requested by the patient (or if unable, by the patient's family).  02/01/2012  Patient/family informed of their freedom to choose among providers that offer the needed level of care, that participate in Medicare, Medicaid or managed care program needed by the patient, have an available bed and are willing to accept the patient.  02/01/2012  Patient/family informed of [facility]' ownership interest in [facility], as well as of the fact that they are under no obligation to receive care at this facility.  PASARR submitted to EDS on 02/01/2012 PASARR number received from EDS on 02/02/2012  FL2 transmitted to all facilities in geographic area requested by pt/family on  02/02/2012 FL2 transmitted to all facilities within larger geographic area on   Patient informed that his/her managed care company has contracts with or will negotiate with  certain facilities, including the following:     Patient/family informed of bed offers received:  02/02/2012 Patient chooses bed at [facility] Physician recommends and patient chooses bed at  [facility]  Patient to be transferred to [facility] on  02/02/2012 Patient to be transferred to facility by RN  The following physician request were entered in Epic:   Additional Comments:  Derenda Fennel, LCSW [phone]

## 2012-02-02 NOTE — Discharge Summary (Signed)
Physician Discharge Summary  Patient ID: CALIANN LECKRONE MRN: [id number] DOB/AGE: 1929-10-26 78 y.o.  Admit date: 01/27/2012 Discharge date: 02/02/2012  Discharge Diagnoses:  Principal Problem:  *Cerebellar stroke, acute Active Problems:  Nausea and vomiting  IDDM  HYPERTENSION  Hypertension  Atrial fibrillation  HYPERCHOLESTEROLEMIA  IIA  PERIPHERAL NEUROPATHY  PULMONARY HYPERTENSION, MILD  Primary malignant neuroendocrine tumor of pancreas  Hyperlipidemia  GERD (gastroesophageal reflux disease)  CLL (chronic lymphocytic leukemia)  Elevated troponin Depression    Medication List     As of 02/02/2012 10:43 AM    STOP taking these medications         escitalopram 5 MG tablet   Commonly known as: LEXAPRO   Replaced by: citalopram 10 MG tablet      polyethylene glycol packet   Commonly known as: MIRALAX / GLYCOLAX      TAKE these medications         amLODipine 10 MG tablet   Commonly known as: NORVASC   Take 5 mg by mouth at bedtime.      aspirin 81 MG EC tablet   Take 1 tablet (81 mg total) by mouth daily.      citalopram 10 MG tablet   Commonly known as: CELEXA   Take 1 tablet (10 mg total) by mouth daily.      colchicine 0.6 MG tablet   Take 0.6 mg by mouth daily as needed. Gout Flare      colesevelam 625 MG tablet   Commonly known as: WELCHOL   Take 625 mg by mouth 3 (three) times daily.      ezetimibe 10 MG tablet   Commonly known as: ZETIA   Take 10 mg by mouth daily.      febuxostat 40 MG tablet   Commonly known as: ULORIC   Take 40 mg by mouth daily.      feeding supplement Pudg   Take 1 Container by mouth 3 (three) times daily between meals.      gabapentin 100 MG capsule   Commonly known as: NEURONTIN   Take 1 capsule (100 mg total) by mouth at bedtime.      insulin aspart 100 UNIT/ML injection   Commonly known as: novoLOG   Inject 0-20 Units into the skin 3 (three) times daily with meals.      insulin aspart 100 UNIT/ML injection     Commonly known as: novoLOG   Inject 0-5 Units into the skin at bedtime.      insulin glargine 100 UNIT/ML injection   Commonly known as: LANTUS   Inject 26 Units into the skin at bedtime.      metoprolol tartrate 25 MG tablet   Commonly known as: LOPRESSOR   Take 25 mg by mouth 2 (two) times daily.      nitroGLYCERIN 0.4 MG SL tablet   Commonly known as: NITROSTAT   Place 1 tablet (0.4 mg total) under the tongue every 5 (five) minutes x 3 doses as needed for chest pain.      olmesartan 40 MG tablet   Commonly known as: BENICAR   Take 40 mg by mouth daily.      ondansetron 4 MG tablet   Commonly known as: ZOFRAN   Take 1 tablet (4 mg total) by mouth every 8 (eight) hours as needed for nausea.      pantoprazole 20 MG tablet   Commonly known as: PROTONIX   Take 20 mg by mouth every morning.  PHENADOZ 25 MG suppository   Generic drug: promethazine   Place 1 suppository rectally Once daily as needed. For nausea and/or vomiting      scopolamine 1.5 MG   Commonly known as: TRANSDERM-SCOP   Place 1 patch (1.5 mg total) onto the skin every 3 (three) days.      Vitamin D3 50000 UNITS Caps   Take 50,000 Units by mouth every 7 (seven) days. TAKES ON THURSDAYS            Discharge Orders    Future Appointments: Provider: Department: Dept Phone: Center:   02/29/2012 11:45 AM Rachael Fee [facility] [phone] None   02/29/2012 12:15 PM Josph Macho, MD [facility] [phone] None   02/29/2012 2:00 PM Malissa Hippo, MD [facility] [phone] None     Future Orders Please Complete By Expires   Diet Carb Modified      Discharge instructions      Comments:   Continue asa 81 mg daily for 4 weeks, then start warfarin back in 4 weeks with goal INR 1.8 to 2.2   Walk with assistance         Follow-up Information    Follow up with [facility], MD. In 1 week. (or nursing home provider. monitor for GI bleed.)    Contact information:    [address] [phone]          Disposition: SNF  Discharged Condition: stable  Consults: Treatment Team:  Malissa Hippo, MD Beryle Beams, MD  Labs:    Sodium  138 139 137   131    Potassium  4.1 3.7 3.6   4.1    Chloride  103 102 98   91    CO2  26 27 28   28     BUN  10 8 10   11     Creatinine, Ser  0.52 0.61 0.59   0.51    Calcium  9.0 9.3 9.7   10.1    GFR calc non Af Amer  88 83 84   88    GFR calc Af Amer  90 mL/min  The eGFR has been calculated using the CKD EPI equation. This calculation has not been validated in all clinical situations. eGFR's persistently <90 mL/min signify possible Chronic Kidney Disease.">9090 mL/min  The eGFR has been calculated using the CKD EPI equation. This calculation has not been validated in all clinical situations. eGFR's persistently <90 mL/min signify possible Chronic Kidney Disease." border=0 src="file:///C:/PROGRAM%20FILES%20(X86)/EPICSYS/V7.8/EN-US/Images/IP_[email or web address].gif" width=5 height=10 90 mL/min  The eGFR has been calculated using the CKD EPI equation. This calculation has not been validated in all clinical situations. eGFR's persistently <90 mL/min signify possible Chronic Kidney Disease.">9090 mL/min  The eGFR has been calculated using the CKD EPI equation. This calculation has not been validated in all clinical situations. eGFR's persistently <90 mL/min signify possible Chronic Kidney Disease." border=0 src="file:///C:/PROGRAM%20FILES%20(X86)/EPICSYS/V7.8/EN-US/Images/IP_[email or web address].gif" width=5 height=10 90 mL/min  The eGFR has been calculated using the CKD EPI equation. This calculation has not been validated in all clinical situations. eGFR's persistently <90 mL/min signify possible Chronic Kidney Disease.">9090 mL/min  The eGFR has been calculated using the CKD EPI equation. This calculation has not been validated in all clinical situations. eGFR's persistently <90 mL/min signify possible Chronic Kidney Disease." border=0  src="file:///C:/PROGRAM%20FILES%20(X86)/EPICSYS/V7.8/EN-US/Images/IP_[email or web address].gif" width=5 height=10   90 mL/min  The eGFR has been calculated using the CKD EPI equation. This calculation has not been validated in all clinical situations. eGFR's persistently <90 mL/min signify possible Chronic Kidney Disease.">9090  mL/min  The eGFR has been calculated using the CKD EPI equation. This calculation has not been validated in all clinical situations. eGFR's persistently <90 mL/min signify possible Chronic Kidney Disease." border=0 src="file:///C:/PROGRAM%20FILES%20(X86)/EPICSYS/V7.8/EN-US/Images/IP_COMMENT_[email or web address].gif" width=5 height=10    Glucose, Bld  258 125 183   198    Magnesium  1.0         Alkaline Phosphatase  82  77       Albumin  3.4  3.2       Lipase  16 9        AST  16  16       ALT  12  9       Total Protein  6.8  6.9       Total Bilirubin  0.3  0.6        CARDIAC PROFILE    Troponin I  3.67           LIPID PROFILE    Cholesterol     142      Triglycerides     177      HDL     44      LDL Cholesterol     63       VLDL     35      Total CHOL/HDL Ratio     3.2       CBC    WBC  19.0  17.8  20.3     RBC  3.23  3.60  3.41     Hemoglobin  9.7  10.8  10.2     HCT  29.8  33.1  31.4     MCV  92.3  91.9  92.1     MCH  30.0  30.0  29.9     MCHC  32.6  32.6  32.5     RDW  14.3  14.0  14.1     Platelets  577  619  599       DIFFERENTIAL    Neutrophils Relative  65    68     Lymphocytes Relative  27    20     Monocytes Relative  6    9     Eosinophils Relative  1    2     Basophils Relative  0    0     Neutro Abs  11.1    13.7     Lymphs Abs  4.6    4.1     Monocytes Absolute  1.1    1.8     Eosinophils Absolute  0.2    0.5     Basophils Absolute  0.1    0.1     WBC Morphology      TOXIC GRANULATION       PROTIME W/ INR    Prothrombin Time  14.1     13.3    INR  1.10     1.02     DIABETES    Glucose, Bld  258 125 183   198     THYROID    TSH  1.307      Diagnostics:  Ct Abdomen Pelvis Wo Contrast  01/28/2012  *RADIOLOGY REPORT*  Clinical Data: Abdominal pain, nausea, vomiting.  CT ABDOMEN AND PELVIS WITHOUT CONTRAST  Technique:  Multidetector CT imaging of the abdomen and pelvis was performed following the standard protocol without intravenous contrast.  Comparison: 09/18/2011  Findings: Bilateral lower lobe areas of consolidation, atelectasis versus pneumonia.  Cardiomegaly.  No effusions.  Postsurgical changes within the stomach.  Gallbladder, pancreas, adrenals are unremarkable.  Mild cortical thinning and perinephric stranding about the kidneys.  No hydronephrosis. Prior splenectomy.  New area of irregular low density within the inferior right hepatic lobe.  This does not appear rounded or mass-like.  I suspect this may represent a new focal fatty infiltration, but further evaluation with ultrasound or contrasted CT may be helpful.  Colonic diverticulosis.  No active diverticulitis.  Small bowel is decompressed.  No free fluid, free air or adenopathy.  Prior hysterectomy.  No adnexal masses.  Urinary bladder decompressed with Foley catheter in place.  Aorta and iliac vessels are heavily calcified.  No aneurysm.  IMPRESSION: Bilateral lower lobe consolidation concerning for pneumonia.  New irregular low density in the inferior right hepatic lobe. Given its.  It is and rapid onset, I suspect this represents focal fatty infiltration. May consider ultrasound or contrasted CT for confirmation.  Colonic diverticulosis.  No acute findings in the abdomen or pelvis.   Original Report Authenticated By: Cyndie Chime, M.D.    Ct Head Wo Contrast  01/29/2012  *RADIOLOGY REPORT*  Clinical Data: Syncope.  Nausea and vomiting.  CT HEAD WITHOUT CONTRAST  Technique:  Contiguous axial images were obtained from the base of the skull through the vertex without contrast.  Comparison: MRI brain without contrast 06/08/2003.  Findings: Remote anterior right frontal  encephalomalacia is stable. Additional white matter changes within the right external capsule and posterior limb of the internal capsule are stable. Periventricular and subcortical white matter hypoattenuation bilaterally corresponds with the prior study.  Bilateral lacunar infarcts in the caudate heads are new, but appear remote.  Moderate generalized atrophy is stable.  A new medial inferior right cerebellar non hemorrhagic infarct is present.  There is effacement of the sulci.  The ventricles are of normal size.  No significant extra-axial fluid collections are present.  A fluid collection is present in the left maxillary sinus.  The remaining paranasal sinuses and to the mastoid air cells are clear.  IMPRESSION:  1.  New right inferior medial cerebellar non hemorrhagic infarct. 2.  Stable encephalomalacia of the anterior right frontal lobe. 3.  Lacunar infarcts of the caudate heads bilaterally appear remote, but are new since 2005. 4.  Otherwise stable atrophy and diffuse white matter disease. 5.  Left maxillary sinusitis.   Original Report Authenticated By: Jamesetta Orleans. MATTERN, M.D.    US Carotid Duplex Bilateral  01/31/2012  *RADIOLOGY REPORT*  Clinical Data: Cerebellar stroke, history of hypertension, syncopal episode, CAD, diabetes  BILATERAL CAROTID DUPLEX ULTRASOUND  Technique: Gray scale imaging, color Doppler and duplex ultrasound was performed of bilateral carotid and vertebral arteries in the neck.  Comparison:  Head CT - 01/29/2012  Criteria:  Quantification of carotid stenosis is based on velocity parameters that correlate the residual internal carotid diameter with NASCET-based stenosis levels, using the diameter of the distal internal carotid lumen as the denominator for stenosis measurement.  The following velocity measurements were obtained:                   PEAK SYSTOLIC/END DIASTOLIC RIGHT ICA:                        96/12cm/sec CCA:                        113/14cm/sec SYSTOLIC ICA/CCA  RATIO:  0.85 DIASTOLIC ICA/CCA RATIO:    0.91 ECA:                        141cm/sec  LEFT ICA:                        92/16cm/sec CCA:                        121/20cm/sec SYSTOLIC ICA/CCA RATIO:     0.76 DIASTOLIC ICA/CCA RATIO:    0.84 ECA:                        122cm/sec  Findings:  RIGHT CAROTID ARTERY: There is a very minimal amount of eccentric echogenic plaque within the right carotid bulb (image 17) which does not result in elevated peak systolic velocities within the right internal carotid artery to suggest hemodynamically significant stenosis.  RIGHT VERTEBRAL ARTERY:  Antegrade flow  LEFT CAROTID ARTERY: There is a very minimal amount of intimal wall thickening within the left carotid bulb (image 52) and eccentric echogenic shadowing plaque within the proximal aspect of the left internal carotid artery (image 62) which does not result in elevated peak systolic velocities within the left internal carotid artery to suggest a hemodynamically significant stenosis.  LEFT VERTEBRAL ARTERY:  Antegrade flow  IMPRESSION:  Minimal amount of bilateral atherosclerotic plaque, left subjectively greater than right, not resulting in hemodynamically significant stenosis.   Original Report Authenticated By: Waynard Reeds, M.D.    Dg Abd Acute W/chest  01/27/2012  *RADIOLOGY REPORT*  Clinical Data: Abdominal pain.  ACUTE ABDOMEN SERIES (ABDOMEN 2 VIEW & CHEST 1 VIEW)  Comparison: Plain film chest 11/24/2011 and CT abdomen and pelvis 09/18/2011.  Findings: Single view of the chest demonstrates cardiomegaly and vascular congestion.  The patient is status post CABG.  No consolidative process, pneumothorax or effusion is identified.  Two views of the abdomen show no free intraperitoneal air.  The abdomen is nearly gasless.  Calcifications in the left upper quadrant are noted likely vascular.  IMPRESSION: No acute finding chest or abdomen.   Original Report Authenticated By: Bernadene Bell. Maricela Curet, M.D.     Procedures:  none  EKG: atrial fib rate 58  DNR   Hospital Course: See H&P for complete admission details. Ms. Ferd Glassing is an 78 year old white female with a history of recent diverticular bleed and atrial fibrillation. She presented to the hospital with difficulty walking, nausea vomiting and dizziness. Previously, she had been on aspirin and Coumadin but this was stopped due to the recent bleed. She had a colonoscopy several weeks ago that showed diverticular bleed. During previous hospitalization, case was discussed with Dr. Riley Kill and he was agreeable to holding antiplatelet and anticoagulation. He saw the patient after followup and noted that she was a high risk for stroke. In the emergency room, patient appeared dehydrated. She had a nonfocal neurologic examination. According to Dr. Dionicia Abler, she has chronic nausea which has been intermittent but this was much worse than usual. She has a chronic leukocytosis from CLL. Her blood sugar was noted to be high. She was anemic but stable from previous hospitalization.  The patient was admitted for further workup. GI was consulted. Eventually, CAT scan of the brain was done which showed a right cerebellar stroke which was new. Dr. Dionicia Abler agreed that aspirin could be resumed. Neurology was consulted. I discussed the case  with Dr. Riley Kill. Fasting lipids were checked. Carotid Dopplers show no critical disease. Patient had an echo a few months ago and this was not repeated. Neurology recommends holding Coumadin for 4 weeks then resuming. Dr. Riley Kill recommends an INR between 1.8 and 2.2. Previously, patient had been on only 2-1/2 mg of Coumadin daily with one day of 1.25. When Coumadin is resumed, consider close monitoring of INR and signs of bleeding. Would start at low dose. For now she may continue low-dose aspirin daily. She's had no bleeding here. She continued to have intractable vomiting with Zofran, Phenergan, Reglan. Eventually, a scopolamine patch was placed and this  seemed to help more than the others. She is also less dizzy. She has worked with physical therapy and would benefit from short-term skilled nursing facility placement. She reports that she would like for her CODE STATUS to be DO NOT RESUSCITATE.  She has periods of delirium which has been present on previous hospitalization as well. She has never been combative or agitated. Currently she is oriented and appropriate. She is tolerating a diet. Nursing home staff will need to continue to push fluids. Her gabapentin was decreased so that she was more alert. Stools will need to be monitored for signs of bleeding. Total time on the day of discharge greater than 30 minutes.  Discharge Exam:  Blood pressure 127/72, pulse 72, temperature 97.4 F (36.3 C), temperature source Oral, resp. rate 20, height 5\' 4"  (1.626 m), weight 73.3 kg (161 lb 9.6 oz), SpO2 97.00%.  General: Alert, oriented and appropriate Lungs clear to auscultation bilaterally without wheezes rhonchi or rales Cardiovascular irregularly irregular Abdomen soft nontender nondistended Extremities no clubbing cyanosis or edema Psychiatric: Flat affect.  SignedChristiane Ha 02/02/2012, 10:43 AM

## 2012-02-03 ENCOUNTER — Ambulatory Visit: Payer: Medicare Other | Admitting: Cardiology

## 2012-02-03 LAB — GLUCOSE, CAPILLARY
Comment 1: 342731
Glucose-Capillary: 172 mg/dL — ABNORMAL HIGH (ref 70–99)
Glucose-Capillary: 250 mg/dL — ABNORMAL HIGH (ref 70–99)
Glucose-Capillary: 191 mg/dL — ABNORMAL HIGH (ref 70–99)

## 2012-02-04 ENCOUNTER — Ambulatory Visit (INDEPENDENT_AMBULATORY_CARE_PROVIDER_SITE_OTHER): Payer: Medicare Other | Admitting: Internal Medicine

## 2012-02-04 LAB — GLUCOSE, CAPILLARY: Comment 2: 250241

## 2012-02-05 LAB — GLUCOSE, CAPILLARY
Glucose-Capillary: 167 mg/dL — ABNORMAL HIGH (ref 70–99)
Glucose-Capillary: 219 mg/dL — ABNORMAL HIGH (ref 70–99)
Glucose-Capillary: 257 mg/dL — ABNORMAL HIGH (ref 70–99)

## 2012-02-06 LAB — GLUCOSE, CAPILLARY
Glucose-Capillary: 256 mg/dL — ABNORMAL HIGH (ref 70–99)
Glucose-Capillary: 266 mg/dL — ABNORMAL HIGH (ref 70–99)

## 2012-02-07 LAB — GLUCOSE, CAPILLARY
Glucose-Capillary: 209 mg/dL — ABNORMAL HIGH (ref 70–99)
Glucose-Capillary: 268 mg/dL — ABNORMAL HIGH (ref 70–99)

## 2012-02-08 LAB — GLUCOSE, CAPILLARY: Glucose-Capillary: 217 mg/dL — ABNORMAL HIGH (ref 70–99)

## 2012-02-09 LAB — GLUCOSE, CAPILLARY: Glucose-Capillary: 306 mg/dL — ABNORMAL HIGH (ref 70–99)

## 2012-02-10 ENCOUNTER — Ambulatory Visit: Payer: Self-pay | Admitting: *Deleted

## 2012-02-10 DIAGNOSIS — Z7901 Long term (current) use of anticoagulants: Secondary | ICD-10-CM

## 2012-02-10 LAB — GLUCOSE, CAPILLARY
Glucose-Capillary: 264 mg/dL — ABNORMAL HIGH (ref 70–99)
Glucose-Capillary: 198 mg/dL — ABNORMAL HIGH (ref 70–99)

## 2012-02-12 LAB — GLUCOSE, CAPILLARY
Glucose-Capillary: 167 mg/dL — ABNORMAL HIGH (ref 70–99)
Glucose-Capillary: 189 mg/dL — ABNORMAL HIGH (ref 70–99)
Glucose-Capillary: 150 mg/dL — ABNORMAL HIGH (ref 70–99)

## 2012-02-13 LAB — GLUCOSE, CAPILLARY
Glucose-Capillary: 189 mg/dL — ABNORMAL HIGH (ref 70–99)
Glucose-Capillary: 254 mg/dL — ABNORMAL HIGH (ref 70–99)
Glucose-Capillary: 156 mg/dL — ABNORMAL HIGH (ref 70–99)
Glucose-Capillary: 177 mg/dL — ABNORMAL HIGH (ref 70–99)

## 2012-02-14 LAB — GLUCOSE, CAPILLARY
Glucose-Capillary: 160 mg/dL — ABNORMAL HIGH (ref 70–99)
Glucose-Capillary: 208 mg/dL — ABNORMAL HIGH (ref 70–99)

## 2012-02-15 LAB — GLUCOSE, CAPILLARY: Glucose-Capillary: 167 mg/dL — ABNORMAL HIGH (ref 70–99)

## 2012-02-16 LAB — GLUCOSE, CAPILLARY
Glucose-Capillary: 227 mg/dL — ABNORMAL HIGH (ref 70–99)
Glucose-Capillary: 208 mg/dL — ABNORMAL HIGH (ref 70–99)
Glucose-Capillary: 266 mg/dL — ABNORMAL HIGH (ref 70–99)

## 2012-02-18 ENCOUNTER — Telehealth: Payer: Self-pay | Admitting: Hematology & Oncology

## 2012-02-18 LAB — GLUCOSE, CAPILLARY
Glucose-Capillary: 160 mg/dL — ABNORMAL HIGH (ref 70–99)
Glucose-Capillary: 220 mg/dL — ABNORMAL HIGH (ref 70–99)
Glucose-Capillary: 196 mg/dL — ABNORMAL HIGH (ref 70–99)
Glucose-Capillary: 181 mg/dL — ABNORMAL HIGH (ref 70–99)

## 2012-02-18 NOTE — Telephone Encounter (Signed)
Friend called cx 11-18 said pt was in nursing home. Did not want to reschedule. RN aware

## 2012-02-19 LAB — GLUCOSE, CAPILLARY
Glucose-Capillary: 205 mg/dL — ABNORMAL HIGH (ref 70–99)
Glucose-Capillary: 149 mg/dL — ABNORMAL HIGH (ref 70–99)
Glucose-Capillary: 191 mg/dL — ABNORMAL HIGH (ref 70–99)

## 2012-02-20 LAB — GLUCOSE, CAPILLARY
Glucose-Capillary: 131 mg/dL — ABNORMAL HIGH (ref 70–99)
Glucose-Capillary: 191 mg/dL — ABNORMAL HIGH (ref 70–99)

## 2012-02-21 LAB — GLUCOSE, CAPILLARY
Glucose-Capillary: 136 mg/dL — ABNORMAL HIGH (ref 70–99)
Glucose-Capillary: 244 mg/dL — ABNORMAL HIGH (ref 70–99)

## 2012-02-23 LAB — GLUCOSE, CAPILLARY
Glucose-Capillary: 230 mg/dL — ABNORMAL HIGH (ref 70–99)
Glucose-Capillary: 185 mg/dL — ABNORMAL HIGH (ref 70–99)
Glucose-Capillary: 162 mg/dL — ABNORMAL HIGH (ref 70–99)
Glucose-Capillary: 232 mg/dL — ABNORMAL HIGH (ref 70–99)
Glucose-Capillary: 248 mg/dL — ABNORMAL HIGH (ref 70–99)

## 2012-02-24 LAB — GLUCOSE, CAPILLARY

## 2012-02-25 LAB — GLUCOSE, CAPILLARY
Glucose-Capillary: 161 mg/dL — ABNORMAL HIGH (ref 70–99)
Glucose-Capillary: 198 mg/dL — ABNORMAL HIGH (ref 70–99)
Glucose-Capillary: 179 mg/dL — ABNORMAL HIGH (ref 70–99)

## 2012-02-26 LAB — GLUCOSE, CAPILLARY
Glucose-Capillary: 82 mg/dL (ref 70–99)
Glucose-Capillary: 186 mg/dL — ABNORMAL HIGH (ref 70–99)
Glucose-Capillary: 204 mg/dL — ABNORMAL HIGH (ref 70–99)

## 2012-02-27 LAB — GLUCOSE, CAPILLARY
Glucose-Capillary: 184 mg/dL — ABNORMAL HIGH (ref 70–99)
Glucose-Capillary: 237 mg/dL — ABNORMAL HIGH (ref 70–99)
Glucose-Capillary: 140 mg/dL — ABNORMAL HIGH (ref 70–99)

## 2012-02-29 ENCOUNTER — Ambulatory Visit (INDEPENDENT_AMBULATORY_CARE_PROVIDER_SITE_OTHER): Payer: Medicare Other | Admitting: Internal Medicine

## 2012-02-29 ENCOUNTER — Ambulatory Visit: Payer: Medicare Other | Admitting: Hematology & Oncology

## 2012-02-29 ENCOUNTER — Other Ambulatory Visit: Payer: Medicare Other | Admitting: Lab

## 2012-02-29 LAB — GLUCOSE, CAPILLARY
Glucose-Capillary: 125 mg/dL — ABNORMAL HIGH (ref 70–99)
Glucose-Capillary: 118 mg/dL — ABNORMAL HIGH (ref 70–99)

## 2012-03-02 ENCOUNTER — Telehealth (INDEPENDENT_AMBULATORY_CARE_PROVIDER_SITE_OTHER): Payer: Self-pay | Admitting: *Deleted

## 2012-03-02 ENCOUNTER — Ambulatory Visit (HOSPITAL_COMMUNITY)
Admit: 2012-03-02 | Discharge: 2012-03-02 | Disposition: A | Discharge status: Skilled Nursing Facility | Payer: Medicare Other | Source: Skilled Nursing Facility | Attending: Internal Medicine | Admitting: Internal Medicine

## 2012-03-02 DIAGNOSIS — M25569 Pain in unspecified knee: Secondary | ICD-10-CM | POA: Insufficient documentation

## 2012-03-02 LAB — GLUCOSE, CAPILLARY
Glucose-Capillary: 234 mg/dL — ABNORMAL HIGH (ref 70–99)
Glucose-Capillary: 99 mg/dL (ref 70–99)

## 2012-03-02 NOTE — Telephone Encounter (Signed)
Per Dr.Rehman the patient does not need an appointment at this time. Family needs to check with Dr.Stuckey regarding Coumadin, Dr.Rehman did not take patient off this medication.

## 2012-03-02 NOTE — Telephone Encounter (Signed)
Rec'd a phone message from Callia, Swim daughter in law. She is asking if or when Arlo will need a doctor'sappointment with Dr.Rehman Says that she is currently a patient at the [facility] Her phone number is [phone]  Per Lupita Leash when she answered the phone, Selena Batten was asking or suggesting that Dr. Karilyn Cota had taken Caycee off the coumadin. If this be the case was a office appointment necessary? Dr.Rehman please advise

## 2012-03-02 NOTE — Telephone Encounter (Signed)
Has been addressed

## 2012-03-03 LAB — GLUCOSE, CAPILLARY
Glucose-Capillary: 141 mg/dL — ABNORMAL HIGH (ref 70–99)
Glucose-Capillary: 168 mg/dL — ABNORMAL HIGH (ref 70–99)
Glucose-Capillary: 152 mg/dL — ABNORMAL HIGH (ref 70–99)

## 2012-03-03 NOTE — Telephone Encounter (Signed)
[name] came by the office and Dr. Patty Sermons message was relayed to him.

## 2012-03-04 LAB — GLUCOSE, CAPILLARY: Glucose-Capillary: 103 mg/dL — ABNORMAL HIGH (ref 70–99)

## 2012-03-05 LAB — GLUCOSE, CAPILLARY
Glucose-Capillary: 104 mg/dL — ABNORMAL HIGH (ref 70–99)
Glucose-Capillary: 152 mg/dL — ABNORMAL HIGH (ref 70–99)

## 2012-03-07 LAB — GLUCOSE, CAPILLARY: Glucose-Capillary: 137 mg/dL — ABNORMAL HIGH (ref 70–99)

## 2012-03-08 LAB — GLUCOSE, CAPILLARY
Glucose-Capillary: 133 mg/dL — ABNORMAL HIGH (ref 70–99)
Glucose-Capillary: 105 mg/dL — ABNORMAL HIGH (ref 70–99)

## 2012-03-10 LAB — GLUCOSE, CAPILLARY
Glucose-Capillary: 134 mg/dL — ABNORMAL HIGH (ref 70–99)
Glucose-Capillary: 148 mg/dL — ABNORMAL HIGH (ref 70–99)
Glucose-Capillary: 197 mg/dL — ABNORMAL HIGH (ref 70–99)
Glucose-Capillary: 137 mg/dL — ABNORMAL HIGH (ref 70–99)

## 2012-03-11 LAB — GLUCOSE, CAPILLARY
Glucose-Capillary: 97 mg/dL (ref 70–99)
Glucose-Capillary: 141 mg/dL — ABNORMAL HIGH (ref 70–99)

## 2012-03-12 LAB — GLUCOSE, CAPILLARY: Glucose-Capillary: 95 mg/dL (ref 70–99)

## 2012-03-13 LAB — GLUCOSE, CAPILLARY
Glucose-Capillary: 180 mg/dL — ABNORMAL HIGH (ref 70–99)
Glucose-Capillary: 116 mg/dL — ABNORMAL HIGH (ref 70–99)
Glucose-Capillary: 105 mg/dL — ABNORMAL HIGH (ref 70–99)

## 2012-03-14 LAB — GLUCOSE, CAPILLARY
Glucose-Capillary: 136 mg/dL — ABNORMAL HIGH (ref 70–99)
Glucose-Capillary: 80 mg/dL (ref 70–99)

## 2012-03-15 LAB — GLUCOSE, CAPILLARY: Glucose-Capillary: 71 mg/dL (ref 70–99)

## 2012-03-16 LAB — GLUCOSE, CAPILLARY
Glucose-Capillary: 115 mg/dL — ABNORMAL HIGH (ref 70–99)
Glucose-Capillary: 107 mg/dL — ABNORMAL HIGH (ref 70–99)

## 2012-03-17 LAB — GLUCOSE, CAPILLARY: Glucose-Capillary: 83 mg/dL (ref 70–99)

## 2012-03-18 ENCOUNTER — Emergency Department (HOSPITAL_COMMUNITY)
Admission: EM | Admit: 2012-03-18 | Discharge: 2012-03-18 | Disposition: A | Discharge status: Home / Self Care | Payer: Medicare Other | Attending: Emergency Medicine | Admitting: Emergency Medicine

## 2012-03-18 ENCOUNTER — Encounter (HOSPITAL_COMMUNITY): Payer: Self-pay | Admitting: Emergency Medicine

## 2012-03-18 ENCOUNTER — Inpatient Hospital Stay
Admission: RE | Admit: 2012-03-18 | Discharge: 2012-04-30 | Disposition: A | Discharge status: Home / Self Care | Payer: PRIVATE HEALTH INSURANCE | Source: Ambulatory Visit | Attending: Internal Medicine | Admitting: Internal Medicine

## 2012-03-18 DIAGNOSIS — I279 Pulmonary heart disease, unspecified: Secondary | ICD-10-CM | POA: Insufficient documentation

## 2012-03-18 DIAGNOSIS — Y939 Activity, unspecified: Secondary | ICD-10-CM | POA: Insufficient documentation

## 2012-03-18 DIAGNOSIS — I252 Old myocardial infarction: Secondary | ICD-10-CM | POA: Insufficient documentation

## 2012-03-18 DIAGNOSIS — S61409A Unspecified open wound of unspecified hand, initial encounter: Secondary | ICD-10-CM | POA: Insufficient documentation

## 2012-03-18 DIAGNOSIS — Z794 Long term (current) use of insulin: Secondary | ICD-10-CM | POA: Insufficient documentation

## 2012-03-18 DIAGNOSIS — Z8639 Personal history of other endocrine, nutritional and metabolic disease: Secondary | ICD-10-CM | POA: Insufficient documentation

## 2012-03-18 DIAGNOSIS — Y92009 Unspecified place in unspecified non-institutional (private) residence as the place of occurrence of the external cause: Secondary | ICD-10-CM | POA: Insufficient documentation

## 2012-03-18 DIAGNOSIS — C9110 Chronic lymphocytic leukemia of B-cell type not having achieved remission: Secondary | ICD-10-CM | POA: Insufficient documentation

## 2012-03-18 DIAGNOSIS — S61411A Laceration without foreign body of right hand, initial encounter: Secondary | ICD-10-CM

## 2012-03-18 DIAGNOSIS — E109 Type 1 diabetes mellitus without complications: Secondary | ICD-10-CM | POA: Insufficient documentation

## 2012-03-18 DIAGNOSIS — I4891 Unspecified atrial fibrillation: Secondary | ICD-10-CM | POA: Insufficient documentation

## 2012-03-18 DIAGNOSIS — R296 Repeated falls: Secondary | ICD-10-CM | POA: Insufficient documentation

## 2012-03-18 DIAGNOSIS — Z79899 Other long term (current) drug therapy: Secondary | ICD-10-CM | POA: Insufficient documentation

## 2012-03-18 DIAGNOSIS — I10 Essential (primary) hypertension: Secondary | ICD-10-CM | POA: Insufficient documentation

## 2012-03-18 DIAGNOSIS — E785 Hyperlipidemia, unspecified: Secondary | ICD-10-CM | POA: Insufficient documentation

## 2012-03-18 DIAGNOSIS — W19XXXA Unspecified fall, initial encounter: Secondary | ICD-10-CM

## 2012-03-18 DIAGNOSIS — I2510 Atherosclerotic heart disease of native coronary artery without angina pectoris: Secondary | ICD-10-CM | POA: Insufficient documentation

## 2012-03-18 DIAGNOSIS — Z8669 Personal history of other diseases of the nervous system and sense organs: Secondary | ICD-10-CM | POA: Insufficient documentation

## 2012-03-18 DIAGNOSIS — Z951 Presence of aortocoronary bypass graft: Secondary | ICD-10-CM | POA: Insufficient documentation

## 2012-03-18 DIAGNOSIS — F3289 Other specified depressive episodes: Secondary | ICD-10-CM | POA: Insufficient documentation

## 2012-03-18 DIAGNOSIS — Z8719 Personal history of other diseases of the digestive system: Secondary | ICD-10-CM | POA: Insufficient documentation

## 2012-03-18 DIAGNOSIS — Z7982 Long term (current) use of aspirin: Secondary | ICD-10-CM | POA: Insufficient documentation

## 2012-03-18 DIAGNOSIS — Z862 Personal history of diseases of the blood and blood-forming organs and certain disorders involving the immune mechanism: Secondary | ICD-10-CM | POA: Insufficient documentation

## 2012-03-18 DIAGNOSIS — F329 Major depressive disorder, single episode, unspecified: Secondary | ICD-10-CM | POA: Insufficient documentation

## 2012-03-18 DIAGNOSIS — K219 Gastro-esophageal reflux disease without esophagitis: Secondary | ICD-10-CM | POA: Insufficient documentation

## 2012-03-18 LAB — GLUCOSE, CAPILLARY
Glucose-Capillary: 189 mg/dL — ABNORMAL HIGH (ref 70–99)
Glucose-Capillary: 159 mg/dL — ABNORMAL HIGH (ref 70–99)

## 2012-03-18 NOTE — ED Notes (Signed)
Pt fell at [facility] [address] today, laceration to right hand, staff state they can see tendons through the laceration. No other complaints. Pt AAx4

## 2012-03-18 NOTE — ED Notes (Signed)
Dr Preston Fleeting in room with patient at this time. Using dermabond to close the skin tear

## 2012-03-18 NOTE — ED Provider Notes (Signed)
History     CSN: [id number]  Arrival date & time 03/18/12  0865   First MD Initiated Contact with Patient 03/18/12 1935      Chief Complaint  Patient presents with  . Fall    (Consider location/radiation/quality/duration/timing/severity/associated sxs/prior treatment) Patient is a 78 y.o. female presenting with fall. The history is provided by the patient.  Fall  She  Says that she lost her balance and fell in her apartment and suffered a laceration to her right hand. She denies other injury. Pain is mild she rates it at 3/10. She is up-to-date on tetanus immunizations.  Past Medical History  Diagnosis Date  . HTN (hypertension)   . Coronary atherosclerosis of unspecified type of vessel, native or graft     a. remote CABG in 1999 with Dr. Laneta Simmers. b. s/p PCI with stent to LCX in 2009 (Platinum protocol study). c. NSTEMI 10/2011 s/p BMS to SVG->diagonal.  . Personal history of other diseases of digestive system   . Gout, unspecified     "have had it in my hands and my feet"  . Unspecified hereditary and idiopathic peripheral neuropathy   . Esophageal reflux   . Anemia   . GI bleed   . Leukocytosis 03/16/2011  . Hyperlipidemia     Statin intolerant  . Pancreas disorder     prior pancreatectomy in 2011 complicated by leak requiring prolong and repeated drainage and subsequent pancreatic stent  . Other chronic pulmonary heart diseases   . Type I (juvenile type) diabetes mellitus without mention of complication, not stated as uncontrolled     Previously on insulin pump  . Permanent atrial fibrillation   . Anginal pain 11/24/11    "1st time"  . Exertional dyspnea 11/24/11  . Depression   . 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 CLL (chronic lymphocytic leukemia)     seeing Dr. [name]  . Myocardial infarction 1999    "on the verge of one"  . NSTEMI (non-ST elevated myocardial infarction) 10/2011    /H&P    Past Surgical History  Procedure Date  .  Oophorectomy 1970's    w hysterectomy and bladder tacking procedure  . Belpharoptosis repair     right upper eyelid  . Esophagogastroduodenoscopy 07/11/2010  . Colonoscopy 10/20/06  . Tonsillectomy 1947  . Appendectomy 1970's?  . Abdominal hysterectomy 1970's  . Coronary angioplasty with stent placement 10/2011    "makes total of 4"; 20-30% ostial left main stenosis; occluded ostial LAD supplied by IMA graft, 20-30% scattered LCx stenoses with widely patent stent and 20% narrowing; diffuse RCA luminal irregularities up to 20-30%; SVG-diagonal patent, but proximal 90% stenosis s/p BMS; patent LIMA-LAD; LVEF 55-65%.  . Coronary angioplasty with stent placement 2009    LCX  . Cataract extraction w/ intraocular lens  implant, bilateral ~ 2003  . Coronary artery bypass graft 1999    LIMA to LAD, SVG to the DX  . Pancreatectomy 2011    c/b leak requiring multiple drainage attempts and stent placement), /H&P  . Cardiac surgery   . Colonoscopy 01/20/2012    Procedure: COLONOSCOPY;  Surgeon: Malissa Hippo, MD;  Location: [facility];  Service: Endoscopy;  Laterality: N/A;    Family History  Problem Relation Age of Onset  . Stroke Mother   . Lung cancer Father   . Healthy Son     History  Substance Use Topics  . Smoking status: Never Smoker   . Smokeless tobacco: Never Used  .  Alcohol Use: No    OB History    Grav Para Term Preterm Abortions TAB SAB Ect Mult Living                  Review of Systems  All other systems reviewed and are negative.    Allergies  Codeine; Ivp dye; Morphine; Prednisone; and Statins  Home Medications   Current Outpatient Rx  Name  Route  Sig  Dispense  Refill  . AMLODIPINE BESYLATE 10 MG PO TABS   Oral   Take 5 mg by mouth at bedtime.         . ASPIRIN 81 MG PO TBEC   Oral   Take 1 tablet (81 mg total) by mouth daily.         Marland Kitchen VITAMIN D3 50000 UNITS PO CAPS   Oral   Take 50,000 Units by mouth every 7 (seven) days. TAKES ON  THURSDAYS         . CITALOPRAM HYDROBROMIDE 10 MG PO TABS   Oral   Take 1 tablet (10 mg total) by mouth daily.         . COLCHICINE 0.6 MG PO TABS   Oral   Take 0.6 mg by mouth daily as needed. Gout Flare         . COLESEVELAM HCL 625 MG PO TABS   Oral   Take 625 mg by mouth 3 (three) times daily.         Marland Kitchen EZETIMIBE 10 MG PO TABS   Oral   Take 10 mg by mouth daily.         . FEBUXOSTAT 40 MG PO TABS   Oral   Take 40 mg by mouth daily.         Marland Kitchen ENSURE PUDDING PO PUDG   Oral   Take 1 Container by mouth 3 (three) times daily between meals.         Marland Kitchen GABAPENTIN 100 MG PO CAPS   Oral   Take 1 capsule (100 mg total) by mouth at bedtime.         . INSULIN ASPART 100 UNIT/ML [place] SOLN   Subcutaneous   Inject 0-20 Units into the skin 3 (three) times daily with meals.   1 vial      . INSULIN ASPART 100 UNIT/ML [place] SOLN   Subcutaneous   Inject 0-5 Units into the skin at bedtime.   1 vial      . INSULIN GLARGINE 100 UNIT/ML [place] SOLN   Subcutaneous   Inject 26 Units into the skin at bedtime.   10 mL      . METOPROLOL TARTRATE 25 MG PO TABS   Oral   Take 25 mg by mouth 2 (two) times daily.         Marland Kitchen NITROGLYCERIN 0.4 MG SL SUBL   Sublingual   Place 1 tablet (0.4 mg total) under the tongue every 5 (five) minutes x 3 doses as needed for chest pain.   25 tablet   4   . OLMESARTAN MEDOXOMIL 40 MG PO TABS   Oral   Take 40 mg by mouth daily.         Marland Kitchen ONDANSETRON HCL 4 MG PO TABS   Oral   Take 1 tablet (4 mg total) by mouth every 8 (eight) hours as needed for nausea.   30 tablet   1   . PANTOPRAZOLE SODIUM 20 MG PO TBEC   Oral   Take  20 mg by mouth every morning.          Marland Kitchen PHENADOZ 25 MG RE SUPP   Rectal   Place 1 suppository rectally Once daily as needed. For nausea and/or vomiting         . SCOPOLAMINE BASE 1.5 MG TD PT72   Transdermal   Place 1 patch (1.5 mg total) onto the skin every 3 (three) days.   10 patch        There  were no vitals taken for this visit.  Physical Exam  Nursing note and vitals reviewed. 78 year old female, resting comfortably and in no acute distress. Vital signs are significant for bradycardia with heart rate 50. Oxygen saturation is 98%, which is normal. Head is normocephalic and atraumatic. PERRLA, EOMI. Oropharynx is clear. Neck is nontender and supple without adenopathy or JVD. Back is nontender and there is no CVA tenderness. Lungs are clear without rales, wheezes, or rhonchi. Chest is nontender. Heart has regular rate and rhythm without murmur. Abdomen is soft, flat, nontender without masses or hepatosplenomegaly and peristalsis is normoactive. Extremities have no cyanosis or edema, full range of motion is present. Irregular laceration is seen over the dorsum of the right hand. Tendon function is normal. Skin is warm and dry without rash. Neurologic: Mental status is normal, cranial nerves are intact, there are no motor or sensory deficits.   ED Course  Procedures (including critical care time)  LACERATION REPAIR Performed by: [email or web address],[email or web address] Authorized by: UUVOZ,DGUYQ Consent: Verbal consent obtained. Risks and benefits: risks, benefits and alternatives were discussed Consent given by: patient Patient identity confirmed: provided demographic data Prepped and Draped in normal sterile fashion Wound explored  Laceration Location: Right hand  Laceration Length: 3.0 cm  No Foreign Bodies seen or palpated  Anesthesia: none  Amount of cleaning: standard  Skin closure: close  Technique: Dermabond  Patient tolerance: Patient tolerated the procedure well with no immediate complications.   1. Fall   2. Laceration of right hand       MDM  Laceration/skin tear of the right hand. Skin does not appear to be strong enough to tolerate sutures, so will be repaired with Dermabond.        Dione Booze, MD 03/19/12 [id number]

## 2012-03-21 LAB — GLUCOSE, CAPILLARY
Glucose-Capillary: 154 mg/dL — ABNORMAL HIGH (ref 70–99)
Glucose-Capillary: 156 mg/dL — ABNORMAL HIGH (ref 70–99)
Glucose-Capillary: 172 mg/dL — ABNORMAL HIGH (ref 70–99)

## 2012-03-22 LAB — GLUCOSE, CAPILLARY
Glucose-Capillary: 142 mg/dL — ABNORMAL HIGH (ref 70–99)
Glucose-Capillary: 163 mg/dL — ABNORMAL HIGH (ref 70–99)

## 2012-03-23 LAB — GLUCOSE, CAPILLARY
Glucose-Capillary: 230 mg/dL — ABNORMAL HIGH (ref 70–99)
Glucose-Capillary: 134 mg/dL — ABNORMAL HIGH (ref 70–99)

## 2012-03-24 LAB — GLUCOSE, CAPILLARY
Glucose-Capillary: 122 mg/dL — ABNORMAL HIGH (ref 70–99)
Glucose-Capillary: 205 mg/dL — ABNORMAL HIGH (ref 70–99)
Glucose-Capillary: 192 mg/dL — ABNORMAL HIGH (ref 70–99)

## 2012-03-25 LAB — GLUCOSE, CAPILLARY
Glucose-Capillary: 182 mg/dL — ABNORMAL HIGH (ref 70–99)
Glucose-Capillary: 148 mg/dL — ABNORMAL HIGH (ref 70–99)

## 2012-03-26 LAB — GLUCOSE, CAPILLARY
Glucose-Capillary: 167 mg/dL — ABNORMAL HIGH (ref 70–99)
Glucose-Capillary: 261 mg/dL — ABNORMAL HIGH (ref 70–99)
Glucose-Capillary: 193 mg/dL — ABNORMAL HIGH (ref 70–99)
Glucose-Capillary: 121 mg/dL — ABNORMAL HIGH (ref 70–99)

## 2012-03-28 LAB — GLUCOSE, CAPILLARY
Glucose-Capillary: 160 mg/dL — ABNORMAL HIGH (ref 70–99)
Glucose-Capillary: 190 mg/dL — ABNORMAL HIGH (ref 70–99)
Glucose-Capillary: 156 mg/dL — ABNORMAL HIGH (ref 70–99)

## 2012-03-29 LAB — GLUCOSE, CAPILLARY: Glucose-Capillary: 168 mg/dL — ABNORMAL HIGH (ref 70–99)

## 2012-03-30 LAB — GLUCOSE, CAPILLARY
Glucose-Capillary: 152 mg/dL — ABNORMAL HIGH (ref 70–99)
Glucose-Capillary: 118 mg/dL — ABNORMAL HIGH (ref 70–99)

## 2012-03-31 LAB — GLUCOSE, CAPILLARY: Glucose-Capillary: 208 mg/dL — ABNORMAL HIGH (ref 70–99)

## 2012-04-01 LAB — GLUCOSE, CAPILLARY
Glucose-Capillary: 198 mg/dL — ABNORMAL HIGH (ref 70–99)
Glucose-Capillary: 164 mg/dL — ABNORMAL HIGH (ref 70–99)
Glucose-Capillary: 147 mg/dL — ABNORMAL HIGH (ref 70–99)
Glucose-Capillary: 108 mg/dL — ABNORMAL HIGH (ref 70–99)

## 2012-04-04 LAB — GLUCOSE, CAPILLARY
Glucose-Capillary: 131 mg/dL — ABNORMAL HIGH (ref 70–99)
Glucose-Capillary: 207 mg/dL — ABNORMAL HIGH (ref 70–99)
Glucose-Capillary: 161 mg/dL — ABNORMAL HIGH (ref 70–99)
Glucose-Capillary: 144 mg/dL — ABNORMAL HIGH (ref 70–99)
Glucose-Capillary: 180 mg/dL — ABNORMAL HIGH (ref 70–99)
Glucose-Capillary: 201 mg/dL — ABNORMAL HIGH (ref 70–99)
Glucose-Capillary: 221 mg/dL — ABNORMAL HIGH (ref 70–99)

## 2012-04-05 LAB — GLUCOSE, CAPILLARY
Glucose-Capillary: 184 mg/dL — ABNORMAL HIGH (ref 70–99)
Glucose-Capillary: 152 mg/dL — ABNORMAL HIGH (ref 70–99)

## 2012-04-06 LAB — GLUCOSE, CAPILLARY: Glucose-Capillary: 129 mg/dL — ABNORMAL HIGH (ref 70–99)

## 2012-04-07 LAB — GLUCOSE, CAPILLARY: Glucose-Capillary: 107 mg/dL — ABNORMAL HIGH (ref 70–99)

## 2012-04-08 LAB — GLUCOSE, CAPILLARY
Glucose-Capillary: 161 mg/dL — ABNORMAL HIGH (ref 70–99)
Glucose-Capillary: 78 mg/dL (ref 70–99)
Glucose-Capillary: 76 mg/dL (ref 70–99)
Glucose-Capillary: 275 mg/dL — ABNORMAL HIGH (ref 70–99)
Glucose-Capillary: 141 mg/dL — ABNORMAL HIGH (ref 70–99)

## 2012-04-11 LAB — GLUCOSE, CAPILLARY
Glucose-Capillary: 82 mg/dL (ref 70–99)
Glucose-Capillary: 63 mg/dL — ABNORMAL LOW (ref 70–99)
Glucose-Capillary: 154 mg/dL — ABNORMAL HIGH (ref 70–99)
Glucose-Capillary: 160 mg/dL — ABNORMAL HIGH (ref 70–99)
Glucose-Capillary: 76 mg/dL (ref 70–99)

## 2012-04-12 LAB — GLUCOSE, CAPILLARY
Glucose-Capillary: 62 mg/dL — ABNORMAL LOW (ref 70–99)
Glucose-Capillary: 103 mg/dL — ABNORMAL HIGH (ref 70–99)
Glucose-Capillary: 129 mg/dL — ABNORMAL HIGH (ref 70–99)

## 2012-04-13 LAB — GLUCOSE, CAPILLARY
Glucose-Capillary: 186 mg/dL — ABNORMAL HIGH (ref 70–99)
Glucose-Capillary: 213 mg/dL — ABNORMAL HIGH (ref 70–99)
Glucose-Capillary: 294 mg/dL — ABNORMAL HIGH (ref 70–99)

## 2012-04-15 LAB — GLUCOSE, CAPILLARY
Glucose-Capillary: 210 mg/dL — ABNORMAL HIGH (ref 70–99)
Glucose-Capillary: 193 mg/dL — ABNORMAL HIGH (ref 70–99)

## 2012-04-16 LAB — GLUCOSE, CAPILLARY: Glucose-Capillary: 249 mg/dL — ABNORMAL HIGH (ref 70–99)

## 2012-04-17 LAB — GLUCOSE, CAPILLARY

## 2012-04-19 LAB — GLUCOSE, CAPILLARY
Glucose-Capillary: 251 mg/dL — ABNORMAL HIGH (ref 70–99)
Glucose-Capillary: 294 mg/dL — ABNORMAL HIGH (ref 70–99)
Glucose-Capillary: 141 mg/dL — ABNORMAL HIGH (ref 70–99)
Glucose-Capillary: 195 mg/dL — ABNORMAL HIGH (ref 70–99)

## 2012-04-20 LAB — GLUCOSE, CAPILLARY
Glucose-Capillary: 244 mg/dL — ABNORMAL HIGH (ref 70–99)
Glucose-Capillary: 260 mg/dL — ABNORMAL HIGH (ref 70–99)

## 2012-04-21 LAB — GLUCOSE, CAPILLARY: Glucose-Capillary: 127 mg/dL — ABNORMAL HIGH (ref 70–99)

## 2012-04-22 LAB — GLUCOSE, CAPILLARY
Glucose-Capillary: 221 mg/dL — ABNORMAL HIGH (ref 70–99)
Glucose-Capillary: 218 mg/dL — ABNORMAL HIGH (ref 70–99)

## 2012-04-23 LAB — GLUCOSE, CAPILLARY
Glucose-Capillary: 231 mg/dL — ABNORMAL HIGH (ref 70–99)
Glucose-Capillary: 191 mg/dL — ABNORMAL HIGH (ref 70–99)

## 2012-04-24 LAB — GLUCOSE, CAPILLARY
Glucose-Capillary: 202 mg/dL — ABNORMAL HIGH (ref 70–99)
Glucose-Capillary: 211 mg/dL — ABNORMAL HIGH (ref 70–99)
Glucose-Capillary: 200 mg/dL — ABNORMAL HIGH (ref 70–99)

## 2012-04-25 LAB — GLUCOSE, CAPILLARY
Glucose-Capillary: 120 mg/dL — ABNORMAL HIGH (ref 70–99)
Glucose-Capillary: 199 mg/dL — ABNORMAL HIGH (ref 70–99)
Glucose-Capillary: 192 mg/dL — ABNORMAL HIGH (ref 70–99)

## 2012-04-26 LAB — GLUCOSE, CAPILLARY: Glucose-Capillary: 195 mg/dL — ABNORMAL HIGH (ref 70–99)

## 2012-04-27 LAB — GLUCOSE, CAPILLARY
Glucose-Capillary: 170 mg/dL — ABNORMAL HIGH (ref 70–99)
Glucose-Capillary: 182 mg/dL — ABNORMAL HIGH (ref 70–99)
Glucose-Capillary: 155 mg/dL — ABNORMAL HIGH (ref 70–99)

## 2012-04-28 LAB — GLUCOSE, CAPILLARY
Glucose-Capillary: 95 mg/dL (ref 70–99)
Glucose-Capillary: 189 mg/dL — ABNORMAL HIGH (ref 70–99)

## 2012-04-29 LAB — GLUCOSE, CAPILLARY
Glucose-Capillary: 232 mg/dL — ABNORMAL HIGH (ref 70–99)
Glucose-Capillary: 187 mg/dL — ABNORMAL HIGH (ref 70–99)

## 2012-04-30 LAB — GLUCOSE, CAPILLARY: Glucose-Capillary: 138 mg/dL — ABNORMAL HIGH (ref 70–99)

## 2012-05-12 ENCOUNTER — Emergency Department (HOSPITAL_COMMUNITY)
Admission: EM | Admit: 2012-05-12 | Discharge: 2012-05-12 | Disposition: A | Discharge status: Home / Self Care | Payer: Medicare Other | Attending: Emergency Medicine | Admitting: Emergency Medicine

## 2012-05-12 ENCOUNTER — Emergency Department (HOSPITAL_COMMUNITY): Payer: Medicare Other

## 2012-05-12 ENCOUNTER — Encounter (HOSPITAL_COMMUNITY): Payer: Self-pay | Admitting: Emergency Medicine

## 2012-05-12 DIAGNOSIS — E1149 Type 2 diabetes mellitus with other diabetic neurological complication: Secondary | ICD-10-CM | POA: Insufficient documentation

## 2012-05-12 DIAGNOSIS — F329 Major depressive disorder, single episode, unspecified: Secondary | ICD-10-CM | POA: Insufficient documentation

## 2012-05-12 DIAGNOSIS — Y9210 Unspecified residential institution as the place of occurrence of the external cause: Secondary | ICD-10-CM | POA: Insufficient documentation

## 2012-05-12 DIAGNOSIS — I252 Old myocardial infarction: Secondary | ICD-10-CM | POA: Insufficient documentation

## 2012-05-12 DIAGNOSIS — W19XXXA Unspecified fall, initial encounter: Secondary | ICD-10-CM

## 2012-05-12 DIAGNOSIS — Z9889 Other specified postprocedural states: Secondary | ICD-10-CM | POA: Insufficient documentation

## 2012-05-12 DIAGNOSIS — I4891 Unspecified atrial fibrillation: Secondary | ICD-10-CM | POA: Insufficient documentation

## 2012-05-12 DIAGNOSIS — Z856 Personal history of leukemia: Secondary | ICD-10-CM | POA: Insufficient documentation

## 2012-05-12 DIAGNOSIS — E109 Type 1 diabetes mellitus without complications: Secondary | ICD-10-CM | POA: Insufficient documentation

## 2012-05-12 DIAGNOSIS — I10 Essential (primary) hypertension: Secondary | ICD-10-CM | POA: Insufficient documentation

## 2012-05-12 DIAGNOSIS — S1093XA Contusion of unspecified part of neck, initial encounter: Secondary | ICD-10-CM | POA: Insufficient documentation

## 2012-05-12 DIAGNOSIS — Y9389 Activity, other specified: Secondary | ICD-10-CM | POA: Insufficient documentation

## 2012-05-12 DIAGNOSIS — I2510 Atherosclerotic heart disease of native coronary artery without angina pectoris: Secondary | ICD-10-CM | POA: Insufficient documentation

## 2012-05-12 DIAGNOSIS — G608 Other hereditary and idiopathic neuropathies: Secondary | ICD-10-CM | POA: Insufficient documentation

## 2012-05-12 DIAGNOSIS — Z862 Personal history of diseases of the blood and blood-forming organs and certain disorders involving the immune mechanism: Secondary | ICD-10-CM | POA: Insufficient documentation

## 2012-05-12 DIAGNOSIS — Z9861 Coronary angioplasty status: Secondary | ICD-10-CM | POA: Insufficient documentation

## 2012-05-12 DIAGNOSIS — Z7982 Long term (current) use of aspirin: Secondary | ICD-10-CM | POA: Insufficient documentation

## 2012-05-12 DIAGNOSIS — M109 Gout, unspecified: Secondary | ICD-10-CM | POA: Insufficient documentation

## 2012-05-12 DIAGNOSIS — S0003XA Contusion of scalp, initial encounter: Secondary | ICD-10-CM | POA: Insufficient documentation

## 2012-05-12 DIAGNOSIS — Z8509 Personal history of malignant neoplasm of other digestive organs: Secondary | ICD-10-CM | POA: Insufficient documentation

## 2012-05-12 DIAGNOSIS — Z951 Presence of aortocoronary bypass graft: Secondary | ICD-10-CM | POA: Insufficient documentation

## 2012-05-12 DIAGNOSIS — Z8659 Personal history of other mental and behavioral disorders: Secondary | ICD-10-CM | POA: Insufficient documentation

## 2012-05-12 DIAGNOSIS — Z8719 Personal history of other diseases of the digestive system: Secondary | ICD-10-CM | POA: Insufficient documentation

## 2012-05-12 DIAGNOSIS — R296 Repeated falls: Secondary | ICD-10-CM | POA: Insufficient documentation

## 2012-05-12 DIAGNOSIS — Z79899 Other long term (current) drug therapy: Secondary | ICD-10-CM | POA: Insufficient documentation

## 2012-05-12 DIAGNOSIS — Z8679 Personal history of other diseases of the circulatory system: Secondary | ICD-10-CM | POA: Insufficient documentation

## 2012-05-12 DIAGNOSIS — Z794 Long term (current) use of insulin: Secondary | ICD-10-CM | POA: Insufficient documentation

## 2012-05-12 DIAGNOSIS — K219 Gastro-esophageal reflux disease without esophagitis: Secondary | ICD-10-CM | POA: Insufficient documentation

## 2012-05-12 DIAGNOSIS — E785 Hyperlipidemia, unspecified: Secondary | ICD-10-CM | POA: Insufficient documentation

## 2012-05-12 DIAGNOSIS — F3289 Other specified depressive episodes: Secondary | ICD-10-CM | POA: Insufficient documentation

## 2012-05-12 NOTE — ED Notes (Signed)
Pt comes from [facility] after a fall that occurred tonight. Pt was sitting on her bed when she became dizzy and fell to the floor. Pt did hit head but denies LOC. Pt has hematoma on back left side of head. Pt is A&O and recalls entire event. Pt denies pain anywhere other than site of hematoma.

## 2012-05-12 NOTE — ED Provider Notes (Signed)
History     CSN: [id number]  Arrival date & time 05/12/12  2013   First MD Initiated Contact with Patient 05/12/12 2019      Chief Complaint  Patient presents with  . Fall     Patient is a 79 y.o. female presenting with fall. The history is provided by the patient.  Fall Incident onset: just prior to arrival. The point of impact was the head. The pain is present in the head. The pain is mild. Pertinent negatives include no fever, no abdominal pain and no loss of consciousness. Exacerbated by: palpation. Treatment on scene includes a c-collar. She has tried rest for the symptoms. The treatment provided mild relief.  pt tells me she was cutting her toenails when she fell into floor She denies dizziness/cp/weakness She denies neck or back pain.  No abd pain is reported  Pt comes from local nursing facility and it was noted that patient fell after reporting dizziness but no LOC is reported  Past Medical History  Diagnosis Date  . HTN (hypertension)   . Coronary atherosclerosis of unspecified type of vessel, native or graft     a. remote CABG in 1999 with Dr. Laneta Simmers. b. s/p PCI with stent to LCX in 2009 (Platinum protocol study). c. NSTEMI 10/2011 s/p BMS to SVG->diagonal.  . Personal history of other diseases of digestive system   . Gout, unspecified     "have had it in my hands and my feet"  . Unspecified hereditary and idiopathic peripheral neuropathy   . Esophageal reflux   . Anemia   . GI bleed   . Leukocytosis 03/16/2011  . Hyperlipidemia     Statin intolerant  . Pancreas disorder     prior pancreatectomy in 2011 complicated by leak requiring prolong and repeated drainage and subsequent pancreatic stent  . Other chronic pulmonary heart diseases   . Type I (juvenile type) diabetes mellitus without mention of complication, not stated as uncontrolled     Previously on insulin pump  . Permanent atrial fibrillation   . Anginal pain 11/24/11    "1st time"  . Exertional dyspnea  11/24/11  . Depression   . 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 CLL (chronic lymphocytic leukemia)     seeing Dr. [name]  . Myocardial infarction 1999    "on the verge of one"  . NSTEMI (non-ST elevated myocardial infarction) 10/2011    /H&P    Past Surgical History  Procedure Date  . Oophorectomy 1970's    w hysterectomy and bladder tacking procedure  . Belpharoptosis repair     right upper eyelid  . Esophagogastroduodenoscopy 07/11/2010  . Colonoscopy 10/20/06  . Tonsillectomy 1947  . Appendectomy 1970's?  . Abdominal hysterectomy 1970's  . Coronary angioplasty with stent placement 10/2011    "makes total of 4"; 20-30% ostial left main stenosis; occluded ostial LAD supplied by IMA graft, 20-30% scattered LCx stenoses with widely patent stent and 20% narrowing; diffuse RCA luminal irregularities up to 20-30%; SVG-diagonal patent, but proximal 90% stenosis s/p BMS; patent LIMA-LAD; LVEF 55-65%.  . Coronary angioplasty with stent placement 2009    LCX  . Cataract extraction w/ intraocular lens  implant, bilateral ~ 2003  . Coronary artery bypass graft 1999    LIMA to LAD, SVG to the DX  . Pancreatectomy 2011    c/b leak requiring multiple drainage attempts and stent placement), /H&P  . Cardiac surgery   . Colonoscopy 01/20/2012    Procedure:  COLONOSCOPY;  Surgeon: Malissa Hippo, MD;  Location: [facility];  Service: Endoscopy;  Laterality: N/A;    Family History  Problem Relation Age of Onset  . Stroke Mother   . Lung cancer Father   . Healthy Son     History  Substance Use Topics  . Smoking status: Never Smoker   . Smokeless tobacco: Never Used  . Alcohol Use: No    OB History    Grav Para Term Preterm Abortions TAB SAB Ect Mult Living                  Review of Systems  Constitutional: Negative for fever.  Respiratory: Negative for shortness of breath.   Cardiovascular: Negative for chest pain.  Gastrointestinal: Negative for  abdominal pain.  Musculoskeletal: Negative for back pain.  Neurological: Negative for loss of consciousness and weakness.  Psychiatric/Behavioral: Negative for agitation.  All other systems reviewed and are negative.    Allergies  Codeine; Ivp dye; Morphine; Prednisone; and Statins  Home Medications   Current Outpatient Rx  Name  Route  Sig  Dispense  Refill  . AMLODIPINE BESYLATE 10 MG PO TABS   Oral   Take 5 mg by mouth at bedtime.         . ASPIRIN 81 MG PO TBEC   Oral   Take 1 tablet (81 mg total) by mouth daily.         Marland Kitchen VITAMIN D3 50000 UNITS PO CAPS   Oral   Take 50,000 Units by mouth every 7 (seven) days. TAKES ON THURSDAYS         . CITALOPRAM HYDROBROMIDE 10 MG PO TABS   Oral   Take 1 tablet (10 mg total) by mouth daily.         . COLCHICINE 0.6 MG PO TABS   Oral   Take 0.6 mg by mouth daily as needed. Gout Flare         . COLESEVELAM HCL 625 MG PO TABS   Oral   Take 625 mg by mouth 3 (three) times daily.         Marland Kitchen EZETIMIBE 10 MG PO TABS   Oral   Take 10 mg by mouth daily.         . FEBUXOSTAT 40 MG PO TABS   Oral   Take 40 mg by mouth daily.         Marland Kitchen ENSURE PUDDING PO PUDG   Oral   Take 1 Container by mouth 3 (three) times daily between meals.         Marland Kitchen GABAPENTIN 100 MG PO CAPS   Oral   Take 1 capsule (100 mg total) by mouth at bedtime.         . INSULIN ASPART 100 UNIT/ML [place] SOLN   Subcutaneous   Inject 0-20 Units into the skin 3 (three) times daily with meals.   1 vial      . INSULIN ASPART 100 UNIT/ML [place] SOLN   Subcutaneous   Inject 0-5 Units into the skin at bedtime.   1 vial      . INSULIN GLARGINE 100 UNIT/ML [place] SOLN   Subcutaneous   Inject 26 Units into the skin at bedtime.   10 mL      . METOPROLOL TARTRATE 25 MG PO TABS   Oral   Take 25 mg by mouth 2 (two) times daily.         Marland Kitchen NITROGLYCERIN 0.4 MG  SL SUBL   Sublingual   Place 1 tablet (0.4 mg total) under the tongue every 5 (five) minutes x  3 doses as needed for chest pain.   25 tablet   4   . OLMESARTAN MEDOXOMIL 40 MG PO TABS   Oral   Take 40 mg by mouth daily.         Marland Kitchen ONDANSETRON HCL 4 MG PO TABS   Oral   Take 1 tablet (4 mg total) by mouth every 8 (eight) hours as needed for nausea.   30 tablet   1   . PANTOPRAZOLE SODIUM 20 MG PO TBEC   Oral   Take 20 mg by mouth every morning.          Marland Kitchen PHENADOZ 25 MG RE SUPP   Rectal   Place 1 suppository rectally Once daily as needed. For nausea and/or vomiting         . SCOPOLAMINE BASE 1.5 MG TD PT72   Transdermal   Place 1 patch (1.5 mg total) onto the skin every 3 (three) days.   10 patch        BP 141/74  Pulse 84  Temp 98.4 F (36.9 C) (Oral)  Resp 18  SpO2 93%  Physical Exam CONSTITUTIONAL: Well developed/well nourished HEAD AND FACE: posterior scalp hematoma EYES: EOMI/PERRL ENMT: Mucous membranes moist, No evidence of facial/nasal trauma NECK: supple no meningeal signs SPINE:entire spine nontender, No bruising/crepitance/stepoffs noted to spine CV: S1/S2 noted, no murmurs/rubs/gallops noted LUNGS: Lungs are clear to auscultation bilaterally, no apparent distress ABDOMEN: soft, nontender, no rebound or guarding GU:no cva tenderness NEURO: Pt is awake/alert, moves all extremitiesx4. No focal motor deficits is noted.   EXTREMITIES: pulses normal, full ROM, no tenderness or deformity noted to any extremity Pelvis is stable SKIN: warm, color normal PSYCH: no abnormalities of mood noted  ED Course  Procedures  8:54 PM It is reported that patient was dizzy at nursing facility and fell hitting her head.  No LOC reported She denies this, she is confused as she thought she came from home She is in no distress.  Due to unclear history of how she fell, will CT head and check EKG 9:25 PM Nurse currently unable to contact nursing home (no answer) however it is confirmed via MAR that she is on xarelto Ct head pending 9:41 PM Ct head neg for  acute disease Pt has no complaints and wants to be discharged Nurse spoke to family who reports pt has confusion at baseline and I feel she is likely at baseline and does not require further workup  MDM  Nursing notes including past medical history and social history reviewed and considered in documentation Previous records reviewed and considered        Date: 05/12/2012  Rate: 87  Rhythm: atrial fibrillation  QRS Axis: normal  Intervals: normal  ST/T Wave abnormalities: nonspecific ST changes  Conduction Disutrbances:none  Narrative Interpretation:   Old EKG Reviewed: unchanged    Joya Gaskins, MD 05/12/12 2142

## 2012-05-22 ENCOUNTER — Emergency Department (HOSPITAL_COMMUNITY)
Admission: EM | Admit: 2012-05-22 | Discharge: 2012-05-22 | Disposition: A | Discharge status: Home / Self Care | Payer: Medicare Other | Attending: Emergency Medicine | Admitting: Emergency Medicine

## 2012-05-22 ENCOUNTER — Encounter (HOSPITAL_COMMUNITY): Payer: Self-pay

## 2012-05-22 DIAGNOSIS — Z79899 Other long term (current) drug therapy: Secondary | ICD-10-CM | POA: Insufficient documentation

## 2012-05-22 DIAGNOSIS — Z9861 Coronary angioplasty status: Secondary | ICD-10-CM | POA: Insufficient documentation

## 2012-05-22 DIAGNOSIS — Z8719 Personal history of other diseases of the digestive system: Secondary | ICD-10-CM | POA: Insufficient documentation

## 2012-05-22 DIAGNOSIS — R739 Hyperglycemia, unspecified: Secondary | ICD-10-CM

## 2012-05-22 DIAGNOSIS — Z8673 Personal history of transient ischemic attack (TIA), and cerebral infarction without residual deficits: Secondary | ICD-10-CM | POA: Insufficient documentation

## 2012-05-22 DIAGNOSIS — F329 Major depressive disorder, single episode, unspecified: Secondary | ICD-10-CM | POA: Insufficient documentation

## 2012-05-22 DIAGNOSIS — I10 Essential (primary) hypertension: Secondary | ICD-10-CM | POA: Insufficient documentation

## 2012-05-22 DIAGNOSIS — F3289 Other specified depressive episodes: Secondary | ICD-10-CM | POA: Insufficient documentation

## 2012-05-22 DIAGNOSIS — Z8639 Personal history of other endocrine, nutritional and metabolic disease: Secondary | ICD-10-CM | POA: Insufficient documentation

## 2012-05-22 DIAGNOSIS — Z859 Personal history of malignant neoplasm, unspecified: Secondary | ICD-10-CM | POA: Insufficient documentation

## 2012-05-22 DIAGNOSIS — K219 Gastro-esophageal reflux disease without esophagitis: Secondary | ICD-10-CM | POA: Insufficient documentation

## 2012-05-22 DIAGNOSIS — I252 Old myocardial infarction: Secondary | ICD-10-CM | POA: Insufficient documentation

## 2012-05-22 DIAGNOSIS — Z8679 Personal history of other diseases of the circulatory system: Secondary | ICD-10-CM | POA: Insufficient documentation

## 2012-05-22 DIAGNOSIS — Z856 Personal history of leukemia: Secondary | ICD-10-CM | POA: Insufficient documentation

## 2012-05-22 DIAGNOSIS — Z862 Personal history of diseases of the blood and blood-forming organs and certain disorders involving the immune mechanism: Secondary | ICD-10-CM | POA: Insufficient documentation

## 2012-05-22 DIAGNOSIS — E785 Hyperlipidemia, unspecified: Secondary | ICD-10-CM | POA: Insufficient documentation

## 2012-05-22 DIAGNOSIS — R5381 Other malaise: Secondary | ICD-10-CM | POA: Insufficient documentation

## 2012-05-22 DIAGNOSIS — Z794 Long term (current) use of insulin: Secondary | ICD-10-CM | POA: Insufficient documentation

## 2012-05-22 DIAGNOSIS — I2510 Atherosclerotic heart disease of native coronary artery without angina pectoris: Secondary | ICD-10-CM | POA: Insufficient documentation

## 2012-05-22 DIAGNOSIS — I4891 Unspecified atrial fibrillation: Secondary | ICD-10-CM | POA: Insufficient documentation

## 2012-05-22 DIAGNOSIS — E1069 Type 1 diabetes mellitus with other specified complication: Secondary | ICD-10-CM | POA: Insufficient documentation

## 2012-05-22 DIAGNOSIS — Z951 Presence of aortocoronary bypass graft: Secondary | ICD-10-CM | POA: Insufficient documentation

## 2012-05-22 DIAGNOSIS — Z8669 Personal history of other diseases of the nervous system and sense organs: Secondary | ICD-10-CM | POA: Insufficient documentation

## 2012-05-22 HISTORY — DX: Cerebral infarction, unspecified: I63.9

## 2012-05-22 HISTORY — DX: Gastro-esophageal reflux disease without esophagitis: K21.9

## 2012-05-22 LAB — GLUCOSE, CAPILLARY: Glucose-Capillary: 340 mg/dL — ABNORMAL HIGH (ref 70–99)

## 2012-05-22 LAB — BASIC METABOLIC PANEL
BUN: 21 mg/dL (ref 6–23)
Chloride: 100 mEq/L (ref 96–112)
Creatinine, Ser: 0.88 mg/dL (ref 0.50–1.10)
Glucose, Bld: 465 mg/dL — ABNORMAL HIGH (ref 70–99)
Potassium: 4.2 mEq/L (ref 3.5–5.1)

## 2012-05-22 MED ORDER — INSULIN ASPART 100 UNIT/ML ~~LOC~~ SOLN
5.0000 [IU] | Freq: Once | SUBCUTANEOUS | Status: AC
  Administered 2012-05-22: 5 [IU] via SUBCUTANEOUS

## 2012-05-22 MED ORDER — INSULIN ASPART 100 UNIT/ML IV SOLN: 5.0000 [IU] | Freq: Once | INTRAVENOUS | Status: DC

## 2012-05-22 MED ORDER — SODIUM CHLORIDE 0.9 % IV BOLUS (SEPSIS)
500.0000 mL | Freq: Once | INTRAVENOUS | Status: AC
  Administered 2012-05-22: 500 mL via INTRAVENOUS

## 2012-05-22 NOTE — ED Notes (Signed)
PT resident of [facility] and cbg today was 525.  C/O feeling thirsty.  PT has pain in r hand that is very tender to touch.

## 2012-05-22 NOTE — ED Notes (Signed)
Report given to Eduard Roux at [facility]. Transport arranged.

## 2012-05-22 NOTE — ED Provider Notes (Signed)
History  This chart was scribed for [name], MD by Erskine Emery, ED Scribe. This patient was seen in room [facility]14[facility] and the patient's care was started at 17:46.   CSN: [id number]  Arrival date & time 05/22/12  1736   First MD Initiated Contact with Patient 05/22/12 1746      Chief Complaint  Patient presents with  . Hyperglycemia    (Consider location/radiation/quality/duration/timing/severity/associated sxs/prior Treatment) [name] is a 79 y.o. female brought in by ambulance, who presents to the Emergency Department complaining of hyperglycemia. Pt denies any pains or complaints at this time. Pt claims she ate dinner tonight.  Pt lives in an apartement at [facility], where they check her CBG about 5-6x/day. Today her CBG was 525. Patient is a 79 y.o. female presenting with weakness. The history is provided by the patient. No language interpreter was used.  Weakness This is a new problem. The current episode started 3 to 5 hours ago. The problem occurs constantly. The problem has not changed since onset.Pertinent negatives include no chest pain, no abdominal pain, no headaches and no shortness of breath. Nothing aggravates the symptoms. Nothing relieves the symptoms. She has tried nothing for the symptoms. The treatment provided no relief.   Dr. Shelva Majestic is the pt's PCP.  Past Medical History  Diagnosis Date  . HTN (hypertension)   . Coronary atherosclerosis of unspecified type of vessel, native or graft     a. remote CABG in 1999 with Dr. Laneta Simmers. b. s/p PCI with stent to LCX in 2009 (Platinum protocol study). c. NSTEMI 10/2011 s/p BMS to SVG->diagonal.  . Personal history of other diseases of digestive system   . Gout, unspecified     "have had it in my hands and my feet"  . Unspecified hereditary and idiopathic peripheral neuropathy   . Esophageal reflux   . Anemia   . GI bleed   . Leukocytosis 03/16/2011  . Hyperlipidemia     Statin intolerant  .  Pancreas disorder     prior pancreatectomy in 2011 complicated by leak requiring prolong and repeated drainage and subsequent pancreatic stent  . Other chronic pulmonary heart diseases   . Type I (juvenile type) diabetes mellitus without mention of complication, not stated as uncontrolled     Previously on insulin pump  . Permanent atrial fibrillation   . Anginal pain 11/24/11    "1st time"  . Exertional dyspnea 11/24/11  . Depression   . 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 CLL (chronic lymphocytic leukemia)     seeing Dr. [name]  . Myocardial infarction 1999    "on the verge of one"  . NSTEMI (non-ST elevated myocardial infarction) 10/2011    /H&P  . Stroke   . Atrial fibrillation   . Depression   . Acid reflux     Past Surgical History  Procedure Laterality Date  . Oophorectomy  1970's    w hysterectomy and bladder tacking procedure  . Belpharoptosis repair      right upper eyelid  . Esophagogastroduodenoscopy  07/11/2010  . Colonoscopy  10/20/06  . Tonsillectomy  1947  . Appendectomy  1970's?  . Abdominal hysterectomy  1970's  . Coronary angioplasty with stent placement  10/2011    "makes total of 4"; 20-30% ostial left main stenosis; occluded ostial LAD supplied by IMA graft, 20-30% scattered LCx stenoses with widely patent stent and 20% narrowing; diffuse RCA luminal irregularities up to 20-30%; SVG-diagonal patent, but  proximal 90% stenosis s/p BMS; patent LIMA-LAD; LVEF 55-65%.  . Coronary angioplasty with stent placement  2009    LCX  . Cataract extraction w/ intraocular lens  implant, bilateral  ~ 2003  . Coronary artery bypass graft  1999    LIMA to LAD, SVG to the DX  . Pancreatectomy  2011    c/b leak requiring multiple drainage attempts and stent placement), /H&P  . Cardiac surgery    . Colonoscopy  01/20/2012    Procedure: COLONOSCOPY;  Surgeon: Malissa Hippo, MD;  Location: [facility];  Service: Endoscopy;  Laterality: N/A;     Family History  Problem Relation Age of Onset  . Stroke Mother   . Lung cancer Father   . Healthy Son     History  Substance Use Topics  . Smoking status: Never Smoker   . Smokeless tobacco: Never Used  . Alcohol Use: No    OB History   Grav Para Term Preterm Abortions TAB SAB Ect Mult Living                  Review of Systems  Constitutional: Negative for fatigue.       Hyperglycemia  HENT: Negative for congestion, sinus pressure and ear discharge.   Eyes: Negative for discharge.  Respiratory: Negative for cough and shortness of breath.   Cardiovascular: Negative for chest pain.  Gastrointestinal: Negative for abdominal pain and diarrhea.  Genitourinary: Negative for frequency and hematuria.  Musculoskeletal: Negative for back pain.  Skin: Negative for rash.  Neurological: Positive for weakness. Negative for seizures and headaches.  Psychiatric/Behavioral: Negative for hallucinations.  All other systems reviewed and are negative.    Allergies  Codeine; Ivp dye; Morphine; Prednisone; and Statins  Home Medications   Current Outpatient Rx  Name  Route  Sig  Dispense  Refill  . Cholecalciferol (VITAMIN D3) 50000 UNITS CAPS   Oral   Take 50,000 Units by mouth every 7 (seven) days. TAKES ON THURSDAYS         . citalopram (CELEXA) 20 MG tablet   Oral   Take 20 mg by mouth daily.         . colchicine 0.6 MG tablet   Oral   Take 0.6 mg by mouth daily as needed. Gout Flare         . ezetimibe (ZETIA) 10 MG tablet   Oral   Take 10 mg by mouth daily.         . feeding supplement (ENSURE) PUDG   Oral   Take 1 Container by mouth 3 (three) times daily between meals.         . insulin aspart (NOVOLOG) 100 UNIT/ML injection   Subcutaneous   Inject 2 Units into the skin 2 (two) times daily. Before lunch and dinner if eating         . insulin glargine (LANTUS) 100 UNIT/ML injection   Subcutaneous   Inject 20 Units into the skin at bedtime.          . megestrol (MEGACE) 40 MG/ML suspension   Oral   Take 400 mg by mouth every morning.         . metoprolol tartrate (LOPRESSOR) 25 MG tablet   Oral   Take 12.5 mg by mouth 2 (two) times daily.          . nitroGLYCERIN (NITROSTAT) 0.4 MG SL tablet   Sublingual   Place 1 tablet (0.4 mg total) under the tongue  every 5 (five) minutes x 3 doses as needed for chest pain.   25 tablet   4   . olmesartan (BENICAR) 40 MG tablet   Oral   Take 40 mg by mouth daily.         Marland Kitchen omeprazole (PRILOSEC) 20 MG capsule   Oral   Take 20 mg by mouth daily.         . ondansetron (ZOFRAN) 4 MG tablet   Oral   Take 1 tablet (4 mg total) by mouth every 8 (eight) hours as needed for nausea.   30 tablet   1   . Rivaroxaban (XARELTO) 20 MG TABS   Oral   Take 20 mg by mouth daily.         Marland Kitchen scopolamine (TRANSDERM-SCOP) 1.5 MG   Transdermal   Place 1 patch (1.5 mg total) onto the skin every 3 (three) days.   10 patch        Triage Vitals: BP 147/89  Pulse 88  Temp(Src) 97.8 F (36.6 C) (Oral)  Resp 18  Ht 5\' 4"  (1.626 m)  SpO2 95%  Physical Exam  Nursing note and vitals reviewed. Constitutional: She is oriented to person, place, and time. She appears well-developed.  HENT:  Head: Normocephalic and atraumatic.  Eyes: Conjunctivae and EOM are normal. No scleral icterus.  Neck: Neck supple. No thyromegaly present.  Cardiovascular: Normal rate and regular rhythm.  Exam reveals no gallop and no friction rub.   No murmur heard. Pulmonary/Chest: No stridor. She has no wheezes. She has no rales. She exhibits no tenderness.  Abdominal: She exhibits no distension. There is no tenderness. There is no rebound.  Musculoskeletal: Normal range of motion. She exhibits no edema.  Lymphadenopathy:    She has no cervical adenopathy.  Neurological: She is oriented to person, place, and time.  Skin: No rash noted. No erythema.  Psychiatric: She has a normal mood and affect. Her behavior is  normal.    ED Course  Procedures (including critical care time) DIAGNOSTIC STUDIES: Oxygen Saturation is 95% on room air, adequate by my interpretation.    COORDINATION OF CARE: 17:55--I evaluated the patient and we discussed a treatment plan including blood work to which the pt agreed.   18:10--Nurse reports repeat CBG is 438.  20:09--I rechecked the pt who is ready for discharge.  Results for orders placed during the hospital encounter of 05/22/12  BASIC METABOLIC PANEL      Result Value Range   Sodium 135  135 - 145 mEq/L   Potassium 4.2  3.5 - 5.1 mEq/L   Chloride 100  96 - 112 mEq/L   CO2 25  19 - 32 mEq/L   Glucose, Bld 465 (*) 70 - 99 mg/dL   BUN 21  6 - 23 mg/dL   Creatinine, Ser 7.84  0.50 - 1.10 mg/dL   Calcium 69.6  8.4 - 29.5 mg/dL   GFR calc non Af Amer 60 (*) >90 mL/min   GFR calc Af Amer 70 (*) >90 mL/min  GLUCOSE, CAPILLARY      Result Value Range   Glucose-Capillary 438 (*) 70 - 99 mg/dL       No diagnosis found.    MDM  Hyperglycemia,  Pt improved with tx      The chart was scribed for me under my direct supervision.  I personally performed the history, physical, and medical decision making and all procedures in the evaluation of this patient.Marland Kitchen  [name], MD 05/22/12 2010

## 2012-07-03 ENCOUNTER — Observation Stay (HOSPITAL_COMMUNITY)
Admission: EM | Admit: 2012-07-03 | Discharge: 2012-07-04 | Disposition: A | Discharge status: Skilled Nursing Facility | Payer: Medicare Other | Attending: Internal Medicine | Admitting: Internal Medicine

## 2012-07-03 ENCOUNTER — Encounter (HOSPITAL_COMMUNITY): Payer: Self-pay

## 2012-07-03 DIAGNOSIS — I214 Non-ST elevation (NSTEMI) myocardial infarction: Secondary | ICD-10-CM

## 2012-07-03 DIAGNOSIS — N390 Urinary tract infection, site not specified: Principal | ICD-10-CM

## 2012-07-03 DIAGNOSIS — K5731 Diverticulosis of large intestine without perforation or abscess with bleeding: Secondary | ICD-10-CM

## 2012-07-03 DIAGNOSIS — T148XXA Other injury of unspecified body region, initial encounter: Secondary | ICD-10-CM

## 2012-07-03 DIAGNOSIS — I209 Angina pectoris, unspecified: Secondary | ICD-10-CM

## 2012-07-03 DIAGNOSIS — R1013 Epigastric pain: Secondary | ICD-10-CM

## 2012-07-03 DIAGNOSIS — E871 Hypo-osmolality and hyponatremia: Secondary | ICD-10-CM

## 2012-07-03 DIAGNOSIS — R404 Transient alteration of awareness: Secondary | ICD-10-CM

## 2012-07-03 DIAGNOSIS — I2510 Atherosclerotic heart disease of native coronary artery without angina pectoris: Secondary | ICD-10-CM

## 2012-07-03 DIAGNOSIS — G609 Hereditary and idiopathic neuropathy, unspecified: Secondary | ICD-10-CM

## 2012-07-03 DIAGNOSIS — E109 Type 1 diabetes mellitus without complications: Secondary | ICD-10-CM

## 2012-07-03 DIAGNOSIS — E119 Type 2 diabetes mellitus without complications: Secondary | ICD-10-CM | POA: Insufficient documentation

## 2012-07-03 DIAGNOSIS — C9110 Chronic lymphocytic leukemia of B-cell type not having achieved remission: Secondary | ICD-10-CM

## 2012-07-03 DIAGNOSIS — K625 Hemorrhage of anus and rectum: Secondary | ICD-10-CM

## 2012-07-03 DIAGNOSIS — K863 Pseudocyst of pancreas: Secondary | ICD-10-CM

## 2012-07-03 DIAGNOSIS — K862 Cyst of pancreas: Secondary | ICD-10-CM

## 2012-07-03 DIAGNOSIS — E860 Dehydration: Secondary | ICD-10-CM

## 2012-07-03 DIAGNOSIS — K219 Gastro-esophageal reflux disease without esophagitis: Secondary | ICD-10-CM

## 2012-07-03 DIAGNOSIS — M109 Gout, unspecified: Secondary | ICD-10-CM

## 2012-07-03 DIAGNOSIS — IMO0002 Reserved for concepts with insufficient information to code with codable children: Secondary | ICD-10-CM

## 2012-07-03 DIAGNOSIS — W19XXXA Unspecified fall, initial encounter: Secondary | ICD-10-CM

## 2012-07-03 DIAGNOSIS — E785 Hyperlipidemia, unspecified: Secondary | ICD-10-CM

## 2012-07-03 DIAGNOSIS — R410 Disorientation, unspecified: Secondary | ICD-10-CM | POA: Diagnosis present

## 2012-07-03 DIAGNOSIS — Z8719 Personal history of other diseases of the digestive system: Secondary | ICD-10-CM

## 2012-07-03 DIAGNOSIS — C7A8 Other malignant neuroendocrine tumors: Secondary | ICD-10-CM

## 2012-07-03 DIAGNOSIS — I519 Heart disease, unspecified: Secondary | ICD-10-CM

## 2012-07-03 DIAGNOSIS — D72829 Elevated white blood cell count, unspecified: Secondary | ICD-10-CM

## 2012-07-03 DIAGNOSIS — I4891 Unspecified atrial fibrillation: Secondary | ICD-10-CM

## 2012-07-03 DIAGNOSIS — R112 Nausea with vomiting, unspecified: Secondary | ICD-10-CM

## 2012-07-03 DIAGNOSIS — Z7901 Long term (current) use of anticoagulants: Secondary | ICD-10-CM

## 2012-07-03 DIAGNOSIS — E7800 Pure hypercholesterolemia, unspecified: Secondary | ICD-10-CM

## 2012-07-03 DIAGNOSIS — E1165 Type 2 diabetes mellitus with hyperglycemia: Secondary | ICD-10-CM | POA: Diagnosis present

## 2012-07-03 DIAGNOSIS — R7989 Other specified abnormal findings of blood chemistry: Secondary | ICD-10-CM

## 2012-07-03 DIAGNOSIS — I4821 Permanent atrial fibrillation: Secondary | ICD-10-CM

## 2012-07-03 DIAGNOSIS — I2789 Other specified pulmonary heart diseases: Secondary | ICD-10-CM

## 2012-07-03 DIAGNOSIS — I639 Cerebral infarction, unspecified: Secondary | ICD-10-CM

## 2012-07-03 DIAGNOSIS — I803 Phlebitis and thrombophlebitis of lower extremities, unspecified: Secondary | ICD-10-CM

## 2012-07-03 DIAGNOSIS — D509 Iron deficiency anemia, unspecified: Secondary | ICD-10-CM

## 2012-07-03 DIAGNOSIS — I10 Essential (primary) hypertension: Secondary | ICD-10-CM

## 2012-07-03 LAB — CBC WITH DIFFERENTIAL/PLATELET
Basophils Relative: 0 % (ref 0–1)
Eosinophils Absolute: 0.2 10*3/uL (ref 0.0–0.7)
Eosinophils Relative: 1 % (ref 0–5)
Hemoglobin: 12.3 g/dL (ref 12.0–15.0)
Lymphocytes Relative: 39 % (ref 12–46)
MCHC: 33.4 g/dL (ref 30.0–36.0)
Monocytes Relative: 8 % (ref 3–12)
Neutrophils Relative %: 52 % (ref 43–77)
RBC: 4.03 MIL/uL (ref 3.87–5.11)
WBC: 15.9 10*3/uL — ABNORMAL HIGH (ref 4.0–10.5)

## 2012-07-03 LAB — URINALYSIS, ROUTINE W REFLEX MICROSCOPIC
Glucose, UA: >1000 mg/dL — AB
Nitrite: NEGATIVE
Specific Gravity, Urine: 1.02 (ref 1.005–1.030)
pH: 8.5 — ABNORMAL HIGH (ref 5.0–8.0)

## 2012-07-03 LAB — URINE MICROSCOPIC-ADD ON

## 2012-07-03 LAB — COMPREHENSIVE METABOLIC PANEL
ALT: 9 U/L (ref 0–35)
AST: 11 U/L (ref 0–37)
CO2: 25 mEq/L (ref 19–32)
Calcium: 9.5 mg/dL (ref 8.4–10.5)
GFR calc non Af Amer: 77 mL/min — ABNORMAL LOW (ref >90)
Potassium: 4.3 mEq/L (ref 3.5–5.1)
Sodium: 131 mEq/L — ABNORMAL LOW (ref 135–145)

## 2012-07-03 LAB — GLUCOSE, CAPILLARY: Glucose-Capillary: 217 mg/dL — ABNORMAL HIGH (ref 70–99)

## 2012-07-03 LAB — VITAMIN B12: Vitamin B-12: 620 pg/mL (ref 211–911)

## 2012-07-03 MED ORDER — LORAZEPAM 2 MG/ML IJ SOLN
0.5000 mg | Freq: Once | INTRAMUSCULAR | Status: AC
  Administered 2012-07-03: 0.5 mg via INTRAVENOUS
  Filled 2012-07-03: qty 1

## 2012-07-03 MED ORDER — SODIUM CHLORIDE 0.9 % IV BOLUS (SEPSIS)
500.0000 mL | Freq: Once | INTRAVENOUS | Status: AC
  Administered 2012-07-03: 500 mL via INTRAVENOUS

## 2012-07-03 MED ORDER — INSULIN REGULAR HUMAN 100 UNIT/ML IJ SOLN: 4.0000 [IU] | Freq: Once | INTRAMUSCULAR | Status: DC

## 2012-07-03 MED ORDER — ACETAMINOPHEN 325 MG PO TABS
650.0000 mg | ORAL_TABLET | Freq: Four times a day (QID) | ORAL | Status: DC | PRN
  Administered 2012-07-04: 650 mg via ORAL
  Filled 2012-07-03: qty 2

## 2012-07-03 MED ORDER — INSULIN ASPART 100 UNIT/ML ~~LOC~~ SOLN
4.0000 [IU] | Freq: Once | SUBCUTANEOUS | Status: AC
  Administered 2012-07-03: 4 [IU] via INTRAVENOUS
  Filled 2012-07-03 (×2): qty 1

## 2012-07-03 MED ORDER — INSULIN ASPART 100 UNIT/ML ~~LOC~~ SOLN: 10.0000 [IU] | Freq: Once | SUBCUTANEOUS | Status: DC

## 2012-07-03 MED ORDER — DEXTROSE 5 % IV SOLN
1.0000 g | INTRAVENOUS | Status: DC
  Filled 2012-07-03 (×2): qty 10

## 2012-07-03 MED ORDER — METOPROLOL TARTRATE 25 MG PO TABS
12.5000 mg | ORAL_TABLET | Freq: Two times a day (BID) | ORAL | Status: DC
  Administered 2012-07-03 – 2012-07-04 (×2): 12.5 mg via ORAL
  Filled 2012-07-03 (×2): qty 1

## 2012-07-03 MED ORDER — ONDANSETRON HCL 4 MG PO TABS: 4.0000 mg | ORAL_TABLET | Freq: Four times a day (QID) | ORAL | Status: DC | PRN

## 2012-07-03 MED ORDER — SODIUM CHLORIDE 0.45 % IV SOLN
INTRAVENOUS | Status: AC
  Administered 2012-07-03: 13:00:00 via INTRAVENOUS

## 2012-07-03 MED ORDER — INSULIN GLARGINE 100 UNIT/ML ~~LOC~~ SOLN
25.0000 [IU] | Freq: Every day | SUBCUTANEOUS | Status: DC
  Filled 2012-07-03 (×2): qty 0.25

## 2012-07-03 MED ORDER — INSULIN ASPART 100 UNIT/ML ~~LOC~~ SOLN: 0.0000 [IU] | Freq: Every day | SUBCUTANEOUS | Status: DC

## 2012-07-03 MED ORDER — INSULIN ASPART 100 UNIT/ML ~~LOC~~ SOLN
5.0000 [IU] | Freq: Once | SUBCUTANEOUS | Status: AC
  Administered 2012-07-03: 5 [IU] via SUBCUTANEOUS
  Filled 2012-07-03: qty 1

## 2012-07-03 MED ORDER — HALOPERIDOL LACTATE 5 MG/ML IJ SOLN
2.0000 mg | Freq: Four times a day (QID) | INTRAMUSCULAR | Status: DC | PRN
  Administered 2012-07-03: 2 mg via INTRAVENOUS
  Filled 2012-07-03: qty 1

## 2012-07-03 MED ORDER — DEXTROSE 5 % IV SOLN
1.0000 g | Freq: Once | INTRAVENOUS | Status: AC
  Administered 2012-07-03: 1 g via INTRAVENOUS
  Filled 2012-07-03: qty 10

## 2012-07-03 MED ORDER — ACETAMINOPHEN 650 MG RE SUPP: 650.0000 mg | Freq: Four times a day (QID) | RECTAL | Status: DC | PRN

## 2012-07-03 MED ORDER — NITROGLYCERIN 0.4 MG SL SUBL: 0.4000 mg | SUBLINGUAL_TABLET | SUBLINGUAL | Status: DC | PRN

## 2012-07-03 MED ORDER — ONDANSETRON HCL 4 MG PO TABS: 4.0000 mg | ORAL_TABLET | Freq: Three times a day (TID) | ORAL | Status: DC | PRN

## 2012-07-03 MED ORDER — SODIUM CHLORIDE 0.9 % IV SOLN
INTRAVENOUS | Status: DC
  Filled 2012-07-03: qty 1

## 2012-07-03 MED ORDER — SODIUM CHLORIDE 0.9 % IV SOLN
INTRAVENOUS | Status: DC
  Administered 2012-07-03 – 2012-07-04 (×2): via INTRAVENOUS

## 2012-07-03 MED ORDER — CEPHALEXIN 500 MG PO CAPS
500.0000 mg | ORAL_CAPSULE | Freq: Once | ORAL | Status: AC
  Administered 2012-07-03: 500 mg via ORAL
  Filled 2012-07-03: qty 1

## 2012-07-03 MED ORDER — SODIUM CHLORIDE 0.9 % IJ SOLN
3.0000 mL | Freq: Two times a day (BID) | INTRAMUSCULAR | Status: DC
  Administered 2012-07-03 – 2012-07-04 (×2): 3 mL via INTRAVENOUS

## 2012-07-03 MED ORDER — SENNOSIDES-DOCUSATE SODIUM 8.6-50 MG PO TABS: 1.0000 | ORAL_TABLET | Freq: Every evening | ORAL | Status: DC | PRN

## 2012-07-03 MED ORDER — RIVAROXABAN 10 MG PO TABS
20.0000 mg | ORAL_TABLET | Freq: Every day | ORAL | Status: DC
  Administered 2012-07-04: 20 mg via ORAL
  Filled 2012-07-03: qty 2

## 2012-07-03 MED ORDER — ONDANSETRON HCL 4 MG/2ML IJ SOLN: 4.0000 mg | Freq: Four times a day (QID) | INTRAMUSCULAR | Status: DC | PRN

## 2012-07-03 MED ORDER — INSULIN ASPART 100 UNIT/ML ~~LOC~~ SOLN
0.0000 [IU] | Freq: Three times a day (TID) | SUBCUTANEOUS | Status: DC
  Administered 2012-07-03: 3 [IU] via SUBCUTANEOUS
  Administered 2012-07-04: 5 [IU] via SUBCUTANEOUS
  Administered 2012-07-04: 2 [IU] via SUBCUTANEOUS

## 2012-07-03 NOTE — H&P (Signed)
Hospital Admission Note Date: 07/03/2012  Patient name: [name] Medical record number: [id number] Date of birth: February 24, 1930 Age: 79 y.o. Gender: female PCP: Dwana Melena, MD  Chief Complaint: found on floor  History of Present Illness:  [name] is an 79 y.o. female from assisted living who was sent to the emergency room by staff when she was found on the floor. She had no witnessed fall or syncopal episode. She sustained no obvious injuries. Patient is currently very confused and agitated and unable to provide much history. Initially, she was quite combative and would not allow me to examine her, nor answer any my questions. She has scratched and threatened ED staff. Family report that they took her to dinner last night and noticed no problems. The daughter-in-law however reports that she seems to have been more confused, slightly more agitated over the past week or so. ED nursing staff report that the assisted living staff report frequent behavioral outbursts. In the emergency room, she was found to have a blood glucose of over 400, urinary tract infection. Upon review of facility records, it appears that her blood sugars routinely run above 300. Initially she was calm and cooperative until told that she would have to be admitted to the hospital. I have ordered Haldol 2 mg. She also received a half a milligram of IV Ativan per ED physician, and is now much calmer. She has chronic leukocytosis related to her CLL. Close status is DO NOT RESUSCITATE.  Past Medical History  Diagnosis Date  . HTN (hypertension)   . Coronary atherosclerosis of unspecified type of vessel, native or graft     a. remote CABG in 1999 with Dr. Laneta Simmers. b. s/p PCI with stent to LCX in 2009 (Platinum protocol study). c. NSTEMI 10/2011 s/p BMS to SVG->diagonal.  . Personal history of other diseases of digestive system   . Gout, unspecified     "have had it in my hands and my feet"  . Unspecified hereditary and idiopathic  peripheral neuropathy   . Esophageal reflux   . Anemia   . GI bleed   . Leukocytosis 03/16/2011  . Hyperlipidemia     Statin intolerant  . Pancreas disorder     prior pancreatectomy in 2011 complicated by leak requiring prolong and repeated drainage and subsequent pancreatic stent  . Other chronic pulmonary heart diseases   . Type I (juvenile type) diabetes mellitus without mention of complication, not stated as uncontrolled     Previously on insulin pump  . Permanent atrial fibrillation   . Anginal pain 11/24/11    "1st time"  . Exertional dyspnea 11/24/11  . Depression   . 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 CLL (chronic lymphocytic leukemia)     seeing Dr. [name]  . Myocardial infarction 1999    "on the verge of one"  . NSTEMI (non-ST elevated myocardial infarction) 10/2011    /H&P  . Stroke   . Atrial fibrillation   . Depression   . Acid reflux    Meds: See medication list.  Allergies: Codeine; Ivp dye; Morphine; Prednisone; and Statins History   Social History  . Marital Status: Widowed    Spouse Name: N/A    Number of Children: N/A  . Years of Education: N/A   Occupational History  . Not on file.   Social History Main Topics  . Smoking status: Never Smoker   . Smokeless tobacco: Never Used  . Alcohol Use: No  . Drug  Use: No  . Sexually Active: No   Other Topics Concern  . Not on file   Social History Narrative   Worked at [employer] for 41 years. Has a son who lives in [place].    Family History  Problem Relation Age of Onset  . Stroke Mother   . Lung cancer Father   . Healthy Son    Past Surgical History  Procedure Laterality Date  . Oophorectomy  1970's    w hysterectomy and bladder tacking procedure  . Belpharoptosis repair      right upper eyelid  . Esophagogastroduodenoscopy  07/11/2010  . Colonoscopy  10/20/06  . Tonsillectomy  1947  . Appendectomy  1970's?  . Abdominal hysterectomy  1970's  . Coronary  angioplasty with stent placement  10/2011    "makes total of 4"; 20-30% ostial left main stenosis; occluded ostial LAD supplied by IMA graft, 20-30% scattered LCx stenoses with widely patent stent and 20% narrowing; diffuse RCA luminal irregularities up to 20-30%; SVG-diagonal patent, but proximal 90% stenosis s/p BMS; patent LIMA-LAD; LVEF 55-65%.  . Coronary angioplasty with stent placement  2009    LCX  . Cataract extraction w/ intraocular lens  implant, bilateral  ~ 2003  . Coronary artery bypass graft  1999    LIMA to LAD, SVG to the DX  . Pancreatectomy  2011    c/b leak requiring multiple drainage attempts and stent placement), /H&P  . Cardiac surgery    . Colonoscopy  01/20/2012    Procedure: COLONOSCOPY;  Surgeon: Malissa Hippo, MD;  Location: [facility];  Service: Endoscopy;  Laterality: N/A;   Review of Systems: Unable due to patient factors  Physical Exam: Blood pressure 147/78, pulse 78, temperature 98.1 F (36.7 C), temperature source Rectal, resp. rate 18, SpO2 98.00%. BP 147/78  Pulse 78  Temp(Src) 98.1 F (36.7 C) (Rectal)  Resp 18  SpO2 98%  General Appearance:    Alert, agitated, combative and verbally abusive. Trying to get out of bed.   Head:    Normocephalic, without obvious abnormality, atraumatic  Eyes:    PERRL, conjunctiva/corneas clear, EOM's intact, fundi    benign, both eyes     Nose:   without drainage   Throat:   uncooperative, but appears to have dry mucous membranes   Neck:   Supple, symmetrical, trachea midline, no adenopathy;    thyroid:  no enlargement/tenderness/nodules; no carotid   bruit or JVD  Back:     Symmetric, no curvature, ROM normal, no CVA tenderness  Lungs:     Clear to auscultation bilaterally, respirations unlabored  Chest Wall:    No tenderness or deformity   Heart:    irregularly irregular without murmurs gallops rubs      Abdomen:     Soft, non-tender, bowel sounds active all four quadrants,    no masses, no  organomegaly  Genitalia:   defered  Rectal:   defered  Extremities:   Extremities normal, atraumatic, no cyanosis or edema.no deformities.   Pulses:   2+ and symmetric all extremities  Skin:   multiple skin tears particularly on hands, with ecchymoses and bruising. Nursing report small pressure sore on the buttocks, but patient is to uncooperative to fully examine at this point.   Lymph nodes:   Cervical, supraclavicular, and axillary nodes normal  Neurologic:   CNII-XII intact, normal strength, sensation and reflexes    throughout    Psychiatric: Agitated and angry appearing. Distractible. When asked location, she  replies "heaven".     Does not know the circumstances of her coming to the emergency room. Sarcastic  Lab results: Basic Metabolic Panel:  Recent Labs  04/54/09 0943  NA 131*  K 4.3  CL 96  CO2 25  GLUCOSE 411*  BUN 18  CREATININE 0.78  CALCIUM 9.5   Liver Function Tests:  Recent Labs  07/03/12 0943  AST 11  ALT 9  ALKPHOS 93  BILITOT 0.2*  PROT 6.4  ALBUMIN 3.1*   No results found for this basename: LIPASE, AMYLASE,  in the last 72 hours No results found for this basename: AMMONIA,  in the last 72 hours CBC:  Recent Labs  07/03/12 0943  WBC 15.9*  NEUTROABS 8.2*  HGB 12.3  HCT 36.8  MCV 91.3  PLT 401*  CBG:  Recent Labs  07/03/12 0943 07/03/12 1355  GLUCAP 391* 269*  Urinalysis:  Recent Labs  07/03/12 0934  COLORURINE YELLOW  LABSPEC 1.020  PHURINE 8.5*  GLUCOSEU >1000*  HGBUR NEGATIVE  BILIRUBINUR NEGATIVE  KETONESUR NEGATIVE  PROTEINUR NEGATIVE  UROBILINOGEN 0.2  NITRITE NEGATIVE  LEUKOCYTESUR MODERATE*   EKG shows atrial fibrillation  Imaging results:  No results found.  Assessment & Plan: Active Problems:   UTI (urinary tract infection): Patient has received Rocephin. Will continue. Culture is pending.   Acute delirium: Patient is starting to calm a bit after getting Ativan and Haldol. Family members are now present to  reorient the patient. The patient carries no diagnosis of dementia that I know of, but I suspect has an element of dementia. No focal neurologic deficits to suggest acute intracranial event.   DM (diabetes mellitus), type 2, uncontrolled: Will give an additional bolus of IV insulin. She received 4 units IV. Will likely need a higher dose of Lantus. Check hemoglobin A1c.   HYPERTENSION   Permanent atrial fibrillation: On xarelto.   Fall   Dehydration: Patient received a 500 cc bolus in the emergency room. Will repeat.   HYPERCHOLESTEROLEMIA  IIA   Gout, unspecified   Chronic anticoagulation   CLL (chronic lymphocytic leukemia)   Multiple skin tears: Local wound care. Hold pressure wire oozing blood. DO NOT RESUSCITATE CODE STATUS.  Shakelia Scrivner L 07/03/2012, 2:24 PM

## 2012-07-03 NOTE — ED Notes (Signed)
Pt is calmer after meds, layin gin bed w/ eyes closed. Normal rise and fall of chest noted.

## 2012-07-03 NOTE — ED Notes (Signed)
Pt up to bsc, voided dark amber in bsc, returned to bed, without difficulty

## 2012-07-03 NOTE — ED Notes (Signed)
Pt found in floor in front of recliner, arrived by ems from [facility].  Pt denies any complaints at present, oriented to self only

## 2012-07-03 NOTE — ED Notes (Signed)
Pt agitated, attempting to leave the ed. Stating that she is ready to leave, upset that she is missing bingo today. Dr.sullivan in the ed to see the pt, meds ordered and given. Order obtained to d/c the insulin drip. (never started, no computer access in the ed)

## 2012-07-03 NOTE — ED Notes (Signed)
Pt resting in bed, easily aroused for meds. Given diet ginger ale to drink.  Swallowed meds without difficulty.

## 2012-07-03 NOTE — ED Provider Notes (Signed)
History  This chart was scribed for Hilario Quarry, MD, by Candelaria Stagers, ED Scribe. This patient was seen in room APA02/APA02 and the patient's care was started at 9:35 AM   CSN: [id number]  Arrival date & time 07/03/12  9147   None     No chief complaint on file.  LEVEL 5 CAVEAT per pt dementia   The history is provided by the EMS personnel and the nursing home. The history is limited by the condition of the patient. No language interpreter was used.   [name] is a 79 y.o. female who presents to the Emergency Department via EMS after the staff at [facility], house where she resides, found her on the floor in her room.  Staff last saw the pt 15 min before the fall and she was normal.  Pt has h/o dementia.  She denies any present pain.  Nursing home is also concerned of UTI.    PCP Dwana Melena, MD Past Medical History  Diagnosis Date  . HTN (hypertension)   . Coronary atherosclerosis of unspecified type of vessel, native or graft     a. remote CABG in 1999 with Dr. Laneta Simmers. b. s/p PCI with stent to LCX in 2009 (Platinum protocol study). c. NSTEMI 10/2011 s/p BMS to SVG->diagonal.  . Personal history of other diseases of digestive system   . Gout, unspecified     "have had it in my hands and my feet"  . Unspecified hereditary and idiopathic peripheral neuropathy   . Esophageal reflux   . Anemia   . GI bleed   . Leukocytosis 03/16/2011  . Hyperlipidemia     Statin intolerant  . Pancreas disorder     prior pancreatectomy in 2011 complicated by leak requiring prolong and repeated drainage and subsequent pancreatic stent  . Other chronic pulmonary heart diseases   . Type I (juvenile type) diabetes mellitus without mention of complication, not stated as uncontrolled     Previously on insulin pump  . Permanent atrial fibrillation   . Anginal pain 11/24/11    "1st time"  . Exertional dyspnea 11/24/11  . Depression   . 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  CLL (chronic lymphocytic leukemia)     seeing Dr. [name]  . Myocardial infarction 1999    "on the verge of one"  . NSTEMI (non-ST elevated myocardial infarction) 10/2011    /H&P  . Stroke   . Atrial fibrillation   . Depression   . Acid reflux     Past Surgical History  Procedure Laterality Date  . Oophorectomy  1970's    w hysterectomy and bladder tacking procedure  . Belpharoptosis repair      right upper eyelid  . Esophagogastroduodenoscopy  07/11/2010  . Colonoscopy  10/20/06  . Tonsillectomy  1947  . Appendectomy  1970's?  . Abdominal hysterectomy  1970's  . Coronary angioplasty with stent placement  10/2011    "makes total of 4"; 20-30% ostial left main stenosis; occluded ostial LAD supplied by IMA graft, 20-30% scattered LCx stenoses with widely patent stent and 20% narrowing; diffuse RCA luminal irregularities up to 20-30%; SVG-diagonal patent, but proximal 90% stenosis s/p BMS; patent LIMA-LAD; LVEF 55-65%.  . Coronary angioplasty with stent placement  2009    LCX  . Cataract extraction w/ intraocular lens  implant, bilateral  ~ 2003  . Coronary artery bypass graft  1999    LIMA to LAD, SVG to the DX  . Pancreatectomy  2011    c/b leak requiring multiple drainage attempts and stent placement), /H&P  . Cardiac surgery    . Colonoscopy  01/20/2012    Procedure: COLONOSCOPY;  Surgeon: Malissa Hippo, MD;  Location: [facility];  Service: Endoscopy;  Laterality: N/A;    Family History  Problem Relation Age of Onset  . Stroke Mother   . Lung cancer Father   . Healthy Son     History  Substance Use Topics  . Smoking status: Never Smoker   . Smokeless tobacco: Never Used  . Alcohol Use: No    OB History   Grav Para Term Preterm Abortions TAB SAB Ect Mult Living                  Review of Systems  Unable to perform ROS: Dementia    Allergies  Codeine; Ivp dye; Morphine; Prednisone; and Statins  Home Medications   Current Outpatient Rx  Name  Route   Sig  Dispense  Refill  . citalopram (CELEXA) 20 MG tablet   Oral   Take 20 mg by mouth daily.         . colchicine 0.6 MG tablet   Oral   Take 0.6 mg by mouth daily as needed. Gout Flare         . docusate sodium (COLACE) 100 MG capsule   Oral   Take 100 mg by mouth 2 (two) times daily.         Marland Kitchen ezetimibe (ZETIA) 10 MG tablet   Oral   Take 10 mg by mouth daily.         . insulin aspart (NOVOLOG) 100 UNIT/ML injection   Subcutaneous   Inject 2 Units into the skin 2 (two) times daily. Before lunch and dinner if eating         . insulin glargine (LANTUS) 100 UNIT/ML injection   Subcutaneous   Inject 20 Units into the skin at bedtime.         . megestrol (MEGACE) 40 MG/ML suspension   Oral   Take 400 mg by mouth every morning.         . metoprolol tartrate (LOPRESSOR) 25 MG tablet   Oral   Take 12.5 mg by mouth 2 (two) times daily.          . nitroGLYCERIN (NITROSTAT) 0.4 MG SL tablet   Sublingual   Place 1 tablet (0.4 mg total) under the tongue every 5 (five) minutes x 3 doses as needed for chest pain.   25 tablet   4   . olmesartan (BENICAR) 40 MG tablet   Oral   Take 40 mg by mouth daily.         Marland Kitchen omeprazole (PRILOSEC) 20 MG capsule   Oral   Take 20 mg by mouth daily.         . ondansetron (ZOFRAN) 4 MG tablet   Oral   Take 1 tablet (4 mg total) by mouth every 8 (eight) hours as needed for nausea.   30 tablet   1   . Rivaroxaban (XARELTO) 20 MG TABS   Oral   Take 20 mg by mouth daily.         Marland Kitchen scopolamine (TRANSDERM-SCOP) 1.5 MG   Transdermal   Place 1 patch (1.5 mg total) onto the skin every 3 (three) days.   10 patch      . Vitamin D, Ergocalciferol, (DRISDOL) 50000 UNITS CAPS   Oral  Take 50,000 Units by mouth every 7 (seven) days. On Thursday           BP 140/53  Pulse 82  Temp(Src) 97.9 F (36.6 C) (Oral)  Resp 20  SpO2 99%  Physical Exam  Nursing note and vitals reviewed. Constitutional: She appears  well-developed and well-nourished. No distress.  HENT:  Head: Normocephalic and atraumatic.  Neck: Normal range of motion.  Pulmonary/Chest: Effort normal. No respiratory distress.  Central sternal scar  Musculoskeletal: Normal range of motion.  Neurological: She is alert.  Not oriented to time.   Skin: Skin is warm and dry. She is not diaphoretic.  Skin tears to dorsum of both hands with steri strips in place.  Old contusions to bilateral lower legs.   Psychiatric: She has a normal mood and affect. Her behavior is normal.    ED Course  Procedures   DIAGNOSTIC STUDIES: Oxygen Saturation is 99% on room air, normal by my interpretation.    COORDINATION OF CARE:  9:42 AM Will order lab work.    Labs Reviewed  URINALYSIS, ROUTINE W REFLEX MICROSCOPIC - Abnormal; Notable for the following:    APPearance CLOUDY (*)    pH 8.5 (*)    Glucose, UA >1000 (*)    Leukocytes, UA MODERATE (*)    All other components within normal limits  CBC WITH DIFFERENTIAL - Abnormal; Notable for the following:    WBC 15.9 (*)    Platelets 401 (*)    Neutro Abs 8.2 (*)    Lymphs Abs 6.2 (*)    Monocytes Absolute 1.3 (*)    All other components within normal limits  COMPREHENSIVE METABOLIC PANEL - Abnormal; Notable for the following:    Sodium 131 (*)    Glucose, Bld 411 (*)    Albumin 3.1 (*)    Total Bilirubin 0.2 (*)    GFR calc non Af Amer 77 (*)    GFR calc Af Amer 89 (*)    All other components within normal limits  GLUCOSE, CAPILLARY - Abnormal; Notable for the following:    Glucose-Capillary 391 (*)    All other components within normal limits  URINE MICROSCOPIC-ADD ON - Abnormal; Notable for the following:    Bacteria, UA MANY (*)    All other components within normal limits  URINE CULTURE   No results found.   No diagnosis found.  Date: 07/03/2012  Rate: 62  Rhythm: atrial fibrillation  QRS Axis: normal  Intervals: afib  ST/T Wave abnormalities: normal  Conduction  Disutrbances:none  Narrative Interpretation:   Old EKG Reviewed: unchanged     MDM  1- found on ground- unclear if fall or syncope 2uti- Plan rocephin, urine culture, patient hemodynamically stable 3- hyperglycemia- iv fluids given, patient now on glucose stabilizer 4- atrial fibrillation- patient on  xarelto and no change in ekg from prior.   I personally performed the services described in this documentation, which was scribed in my presence. The recorded information has been reviewed and considered.   Patient care discussed with Dr. Lendell Caprice and patient will be observed for glucose control with infection.        Hilario Quarry, MD 07/03/12 1240

## 2012-07-03 NOTE — ED Notes (Signed)
Attempted to give pt lunch meal tray, she stated "don't give me that dried up yard bird"

## 2012-07-04 DIAGNOSIS — W19XXXA Unspecified fall, initial encounter: Secondary | ICD-10-CM

## 2012-07-04 DIAGNOSIS — E871 Hypo-osmolality and hyponatremia: Secondary | ICD-10-CM | POA: Diagnosis present

## 2012-07-04 LAB — FOLATE RBC: RBC Folate: 491 ng/mL (ref >=366)

## 2012-07-04 LAB — GLUCOSE, CAPILLARY: Glucose-Capillary: 136 mg/dL — ABNORMAL HIGH (ref 70–99)

## 2012-07-04 MED ORDER — INSULIN GLARGINE 100 UNIT/ML ~~LOC~~ SOLN: 28.0000 [IU] | Freq: Every day | SUBCUTANEOUS | Status: DC

## 2012-07-04 MED ORDER — INSULIN ASPART 100 UNIT/ML ~~LOC~~ SOLN: 8.0000 [IU] | Freq: Two times a day (BID) | SUBCUTANEOUS | Status: DC

## 2012-07-04 MED ORDER — CEFUROXIME AXETIL 500 MG PO TABS: 500.0000 mg | ORAL_TABLET | Freq: Two times a day (BID) | ORAL | Status: DC

## 2012-07-04 MED ORDER — INSULIN GLARGINE 100 UNIT/ML ~~LOC~~ SOLN
28.0000 [IU] | Freq: Every day | SUBCUTANEOUS | Status: DC
  Filled 2012-07-04: qty 0.28

## 2012-07-04 NOTE — Discharge Summary (Signed)
Physician Discharge Summary  [name] XBJ:[id number] DOB: 1929-08-19 DOA: 07/03/2012  PCP: Dwana Melena, MD  Admit date: 07/03/2012 Discharge date: 07/04/2012  Time spent: 35 minutes minutes  Recommendations for Outpatient Follow-up:  1. Follow up with PCP 1 week. Recommend evaluation of diabetes control. And follow up final urine culture results.  Discharge Diagnoses:  Active Problems:   HYPERCHOLESTEROLEMIA  IIA   Gout, unspecified   HYPERTENSION   Chronic anticoagulation   Permanent atrial fibrillation   CLL (chronic lymphocytic leukemia)   UTI (urinary tract infection), urine culture greater than 100,000 GNR   Acute delirium   Fall   DM (diabetes mellitus), type 2, uncontrolled   Dehydration   Multiple skin tears   Hyponatremia  Discharge Condition: stable  Diet recommendation: carb modified  Filed Weights   07/03/12 1506  Weight: 57.607 kg (127 lb)    History of present illness:  [name] is an 79 y.o. female from assisted living who was sent to the emergency room by staff on 07/03/12 when she was found on the floor. She had no witnessed fall or syncopal episode. She sustained no obvious injuries. Patient was very confused and agitated and unable to provide much history. Initially, she was quite combative and would not allow admitting MD to examine her, nor would she  answer any questions. She had scratched and threatened ED staff. Family reported that they took her to dinner the night prior and noticed no problems. The daughter-in-law however reported that she seemed to have been more confused, slightly more agitated over the past week or so. ED nursing staff reported that the assisted living staff reported frequent behavioral outbursts. In the emergency room, she was found to have a blood glucose of over 400, urinary tract infection. Upon review of facility records, it appeared that her blood sugars routinely run above 300. Initially she was calm and cooperative until  told that she would have to be admitted to the hospital. She received Haldol 2 mg in the ED. She also received a half a milligram of IV Ativan per ED physician, and  Became much calmer. She has chronic leukocytosis related to her CLL. Work up yields UTI. [facility] asked to admit   Hospital Course:  UTI (urinary tract infection): Pt admitted for observation. Rocephin for 2 doses. At discharge she is afebrile no white count, non-toxic appearing. Output within normal limits. Will discharge her with Ceftin for 7 more days.    Acute delirium: Much improved at discharge. Cooperative, calm follows commands. No focal neurologic deficits to suggest acute intracranial event.   Hyponatremia:Mild.  likely related to #1 and decreased po intake. Recommend follow up BMEt 1 week to trend     DM (diabetes mellitus), type 2, uncontrolled: A1C 10.7. CBG range 106-269. Received IV insulin on admission as well as bolus SQ. Seen by  diabetes coordinator. At discharge will  increase lantus to 28 units and Novolog to 8units given A1C value.  Recommend follow up with PCP 1 week to evaluate CBG's and glucose control.   HYPERTENSION : controlled. Continue home meds   Permanent atrial fibrillation: On xarelto. Rate controlled   Fall   Dehydration: resolved discharge. Taking po fluids but does need encouragement.    HYPERCHOLESTEROLEMIA IIA : statin intolerant according to chart   Gout, unspecified :  No s/sx of flare. Continue home meds  Chronic anticoagulation : xarelto   CLL (chronic lymphocytic leukemia) with chronic leukocytosis stable at baseline  Multiple skin tears: Local  wound care. No active bleeding at discharge   Procedures: none Consultations: none  Discharge Exam: Filed Vitals:   07/03/12 2018 07/04/12 0218 07/04/12 0420 07/04/12 0907  BP: 166/72 162/78 152/83 146/82  Pulse: 65 65 67 85  Temp: 98 F (36.7 C) 97.2 F (36.2 C) 97.2 F (36.2 C)   TempSrc: Oral Oral Oral   Resp: 18 18 20     Height:      Weight:      SpO2: 95% 100% 93%    Physical Exam See progress note dated 07/04/12.   Discharge Instructions  Discharge Orders   Future Orders Complete By Expires     Call MD for:  persistant nausea and vomiting  As directed     Call MD for:  temperature >100.4  As directed     Diet - low sodium heart healthy  As directed     Increase activity slowly  As directed         Medication List    TAKE these medications       cefUROXime 500 MG tablet  Commonly known as:  CEFTIN  Take 1 tablet (500 mg total) by mouth 2 (two) times daily.     colchicine 0.6 MG tablet  Take 0.6 mg by mouth daily as needed. Gout Flare     docusate sodium 100 MG capsule  Commonly known as:  COLACE  Take 100 mg by mouth daily.     ezetimibe 10 MG tablet  Commonly known as:  ZETIA  Take 10 mg by mouth daily.     insulin aspart 100 UNIT/ML injection  Commonly known as:  novoLOG  Inject 8 Units into the skin 2 (two) times daily. Before lunch and dinner if eating     insulin glargine 100 UNIT/ML injection  Commonly known as:  LANTUS  Inject 0.28 mLs (28 Units total) into the skin at bedtime.     magnesium oxide 400 MG tablet  Commonly known as:  MAG-OX  Take 400 mg by mouth 2 (two) times daily.     megestrol 40 MG/ML suspension  Commonly known as:  MEGACE  Take 400 mg by mouth every morning.     metoprolol tartrate 25 MG tablet  Commonly known as:  LOPRESSOR  Take 12.5 mg by mouth 2 (two) times daily.     nitroGLYCERIN 0.4 MG SL tablet  Commonly known as:  NITROSTAT  Place 1 tablet (0.4 mg total) under the tongue every 5 (five) minutes x 3 doses as needed for chest pain.     olmesartan 40 MG tablet  Commonly known as:  BENICAR  Take 40 mg by mouth daily.     omeprazole 20 MG capsule  Commonly known as:  PRILOSEC  Take 20 mg by mouth daily.     ondansetron 4 MG tablet  Commonly known as:  ZOFRAN  Take 1 tablet (4 mg total) by mouth every 8 (eight) hours as needed for  nausea.     polyethylene glycol packet  Commonly known as:  MIRALAX / GLYCOLAX  Take 17 g by mouth daily.     Vitamin D (Ergocalciferol) 50000 UNITS Caps  Commonly known as:  DRISDOL  Take 50,000 Units by mouth every 7 (seven) days. On Thursday     XARELTO 20 MG Tabs  Generic drug:  Rivaroxaban  Take 20 mg by mouth daily.          The results of significant diagnostics from this hospitalization (including imaging, microbiology, ancillary  and laboratory) are listed below for reference.    Significant Diagnostic Studies: No results found.  Microbiology: Recent Results (from the past 240 hour(s))  URINE CULTURE     Status: None   Collection Time    07/03/12  9:34 AM      Result Value Range Status   Specimen Description URINE, CLEAN CATCH   Final   Special Requests URINE, CLEAN CATCH   Final   Culture  Setup Time 07/03/2012 20:47   Final   Colony Count >=100,000 COLONIES/ML   Final   Culture GRAM NEGATIVE RODS   Final   Report Status PENDING   Incomplete     Labs: Basic Metabolic Panel:  Recent Labs Lab 07/03/12 0943  NA 131*  K 4.3  CL 96  CO2 25  GLUCOSE 411*  BUN 18  CREATININE 0.78  CALCIUM 9.5   Liver Function Tests:  Recent Labs Lab 07/03/12 0943  AST 11  ALT 9  ALKPHOS 93  BILITOT 0.2*  PROT 6.4  ALBUMIN 3.1*   No results found for this basename: LIPASE, AMYLASE,  in the last 168 hours No results found for this basename: AMMONIA,  in the last 168 hours CBC:  Recent Labs Lab 07/03/12 0943  WBC 15.9*  NEUTROABS 8.2*  HGB 12.3  HCT 36.8  MCV 91.3  PLT 401*   Cardiac Enzymes: No results found for this basename: CKTOTAL, CKMB, CKMBINDEX, TROPONINI,  in the last 168 hours BNP: BNP (last 3 results) No results found for this basename: PROBNP,  in the last 8760 hours CBG:  Recent Labs Lab 07/03/12 1653 07/03/12 2030 07/03/12 2104 07/04/12 0824 07/04/12 1213  GLUCAP 164* 106* 106* 136* 218*   Signed:  BLACK,KAREN M  [facility] 07/04/2012, 1:25 PM  Attending note: Patient interviewed and examined. Amended in bold. Agree with above.  Crista Curb, M.D.

## 2012-07-04 NOTE — Clinical Social Work Psychosocial (Signed)
Clinical Social Work Department BRIEF PSYCHOSOCIAL ASSESSMENT 07/04/2012  Patient:  [name], [name]     Account Number:  [id number]     Admit date:  07/03/2012  Clinical Social Worker:  Nancie Neas  Date/Time:  07/04/2012 11:45 AM  Referred by:  CSW  Date Referred:  07/04/2012 Referred for  ALF Placement   Other Referral:   Interview type:  Family Other interview type:   [name]- daughter-in-law    PSYCHOSOCIAL DATA Living Status:  FACILITY Admitted from facility:  [facility] Level of care:  Assisted Living Primary support name:  [name] Primary support relationship to patient:  CHILD, ADULT Degree of support available:   supportive    CURRENT CONCERNS Current Concerns  Post-Acute Placement   Other Concerns:    SOCIAL WORK ASSESSMENT / PLAN CSW met with pt at bedside. Pt alert but only oriented to self. CSW spoke with [name] at [facility]. Pt has been resident at [facility] for several months. She is on AL unit. Per [name], pt feeds herself, and ambulates some with rolling walker, but primarily uses wheelchair at facility. Pt requires total assistance with dressing and bathing. Okay to return. CSW left voicemail for pt's son [name] who had his wife [name] call CSW back. She reports no concerns at [facility] and request return. Pt d/c today. CSW notified family and facility. Agreeable to no FL2 due to <24 hour observation. RN will provide d/c summary and AVS when facility picks up pt.   Assessment/plan status:  Referral to [facility] Other assessment/ plan:   Information/referral to community resources:   [employer]    PATIENT'S/FAMILY'S RESPONSE TO PLAN OF CARE: Pt unable to discuss plan of care. Family report positive feelings regarding return to [facility] today.        Derenda Fennel, [place] [phone]

## 2012-07-04 NOTE — Progress Notes (Signed)
[facility] PROGRESS NOTE  [name] NGE:[id number] DOB: Oct 01, 1929 DOA: 07/03/2012 PCP: Dwana Melena, MD  Assessment/Plan: UTI (urinary tract infection): rocephin day #2. Afebrile no white count, non-toxic appearing. Culture  pending.   Acute delirium: Much improved this am. Cooperative, calm follows commands.  No focal neurologic deficits to suggest acute intracranial event.   Hyponatremia: likely related to #1 and decreased po intake. Will continue IV at lower rate as pt can take po's when prompted. Recheck in am.   DM (diabetes mellitus), type 2, uncontrolled: A1C 10.7. CBG range 106-269. Received  IV insulin yesterday as well as bolus SQ. Appreciate diabetes coordinator input. Will increase lantus to 28 units given A1C value. Continue SSI. Anticipate increasing home novolog regimen as well at discharge.   HYPERTENSION : fair control. On home BB will resume benicar Permanent atrial fibrillation: On xarelto. Rate controlled Fall : PT to evaluate Dehydration: improved. Taking po fluids with encouragement. Will continue IV fluids at lower rate.   HYPERCHOLESTEROLEMIA IIA : statin intolerant according to chart Gout, unspecified : home meds on hold. No s/sx of flare Chronic anticoagulation : xarelto  CLL (chronic lymphocytic leukemia)  Multiple skin tears: Local wound care. Hold pressure wire oozing blood  Code Status: DNR Family Communication: family by Dr. Lendell Caprice via phone Disposition Plan: back to assisted living hopefully tomorrow   Consultants:  none  Procedures:  none  Antibiotics:  rocephinh 07/03/12  HPI/Subjective: Sitting on side of bed. Cooperative  Objective: Filed Vitals:   07/03/12 2018 07/04/12 0218 07/04/12 0420 07/04/12 0907  BP: 166/72 162/78 152/83 146/82  Pulse: 65 65 67 85  Temp: 98 F (36.7 C) 97.2 F (36.2 C) 97.2 F (36.2 C)   TempSrc: Oral Oral Oral   Resp: 18 18 20    Height:      Weight:      SpO2: 95% 100% 93%      Intake/Output Summary (Last 24 hours) at 07/04/12 1149 Last data filed at 07/04/12 0926  Gross per 24 hour  Intake    300 ml  Output    350 ml  Net    -50 ml   Filed Weights   07/03/12 1506  Weight: 57.607 kg (127 lb)    Exam:   General:  Somewhat frail appearing NAD  Cardiovascular: irregularly irregular No MGR No LE edema  Respiratory: normal effort BSCTAB no wheeze  Abdomen: soft +BS non-tender to palpation  Musculoskeletal: joints without swelling. Non-tender no clubbing   Neuro: oriented to self only. Able to make basic needs known. Follows commands. Speech clear   Data Reviewed: Basic Metabolic Panel:  Recent Labs Lab 07/03/12 0943  NA 131*  K 4.3  CL 96  CO2 25  GLUCOSE 411*  BUN 18  CREATININE 0.78  CALCIUM 9.5   Liver Function Tests:  Recent Labs Lab 07/03/12 0943  AST 11  ALT 9  ALKPHOS 93  BILITOT 0.2*  PROT 6.4  ALBUMIN 3.1*   No results found for this basename: LIPASE, AMYLASE,  in the last 168 hours No results found for this basename: AMMONIA,  in the last 168 hours CBC:  Recent Labs Lab 07/03/12 0943  WBC 15.9*  NEUTROABS 8.2*  HGB 12.3  HCT 36.8  MCV 91.3  PLT 401*   Cardiac Enzymes: No results found for this basename: CKTOTAL, CKMB, CKMBINDEX, TROPONINI,  in the last 168 hours BNP (last 3 results) No results found for this basename: PROBNP,  in the last 8760 hours CBG:  Recent Labs Lab 07/03/12 1455 07/03/12 1653 07/03/12 2030 07/03/12 2104 07/04/12 0824  GLUCAP 217* 164* 106* 106* 136*    No results found for this or any previous visit (from the past 240 hour(s)).   Studies: No results found.  Scheduled Meds: . cefTRIAXone (ROCEPHIN)  IV  1 g Intravenous Q24H  . insulin aspart  0-15 Units Subcutaneous TID WC  . insulin aspart  0-5 Units Subcutaneous QHS  . insulin glargine  25 Units Subcutaneous QHS  . metoprolol tartrate  12.5 mg Oral BID  . Rivaroxaban  20 mg Oral Daily  . sodium chloride  3 mL  Intravenous Q12H   Continuous Infusions: . sodium chloride 125 mL/hr at 07/03/12 1603     Time spent: 30 minutes  [facility] M  [facility]  If 7PM-7AM, please contact night-coverage at [email or web address], password [facility] 07/04/2012, 11:49 AM  LOS: 1 day   Attending note:  Patient interviewed and examined. Stable for discharge. See discharge summary.  Crista Curb, M.D.

## 2012-07-04 NOTE — Progress Notes (Signed)
Inpatient Diabetes Program Recommendations  AACE/ADA: New Consensus Statement on Inpatient Glycemic Control (2013)  Target Ranges:  Prepandial:   less than 140 mg/dL      Peak postprandial:   less than 180 mg/dL (1-2 hours)      Critically ill patients:  140 - 180 mg/dL   Results for CENDY, OCONNOR (MRN [id number]) as of 07/04/2012 09:03  Ref. Range 07/03/2012 09:43 07/03/2012 13:55 07/03/2012 14:55 07/03/2012 16:53 07/03/2012 20:30 07/03/2012 21:04 07/04/2012 08:24  Glucose-Capillary Latest Range: 70-99 mg/dL 409 (H) 811 (H) 914 (H) 164 (H) 106 (H) 106 (H) 136 (H)    Note: Patient has a history of diabetes and takes Lantus 25 units QHS and Novolog 6 units before lunch and supper at assisted living facility for diabetes management.  Currently, patient is ordered to receive Lantus 25 units QHS and Novolog moderate correction ACHS for inpatient glycemic control.  MD please note that patient did not receive Lantus 25 units last night on 3/23 at bedtime, as it was charted as refused by night RN.  Anticipate blood glucose to increase significantly throughout the day if basal insulin is not given.  Will continue to monitor.  Thanks, Orlando Penner, RN, BSN, CCRN Diabetes Coordinator Inpatient Diabetes Program [phone]

## 2012-07-04 NOTE — Progress Notes (Signed)
UR Chart Review Completed  

## 2012-07-05 LAB — URINE CULTURE

## 2012-07-20 ENCOUNTER — Encounter (HOSPITAL_COMMUNITY): Payer: Self-pay | Admitting: Emergency Medicine

## 2012-07-20 ENCOUNTER — Emergency Department (HOSPITAL_COMMUNITY)
Admission: EM | Admit: 2012-07-20 | Discharge: 2012-07-20 | Disposition: A | Discharge status: Home / Self Care | Payer: Medicare Other | Attending: Emergency Medicine | Admitting: Emergency Medicine

## 2012-07-20 DIAGNOSIS — Z8709 Personal history of other diseases of the respiratory system: Secondary | ICD-10-CM | POA: Insufficient documentation

## 2012-07-20 DIAGNOSIS — Z8679 Personal history of other diseases of the circulatory system: Secondary | ICD-10-CM | POA: Insufficient documentation

## 2012-07-20 DIAGNOSIS — I252 Old myocardial infarction: Secondary | ICD-10-CM | POA: Insufficient documentation

## 2012-07-20 DIAGNOSIS — S99929A Unspecified injury of unspecified foot, initial encounter: Secondary | ICD-10-CM | POA: Insufficient documentation

## 2012-07-20 DIAGNOSIS — Z8673 Personal history of transient ischemic attack (TIA), and cerebral infarction without residual deficits: Secondary | ICD-10-CM | POA: Insufficient documentation

## 2012-07-20 DIAGNOSIS — Z794 Long term (current) use of insulin: Secondary | ICD-10-CM | POA: Insufficient documentation

## 2012-07-20 DIAGNOSIS — I10 Essential (primary) hypertension: Secondary | ICD-10-CM | POA: Insufficient documentation

## 2012-07-20 DIAGNOSIS — F3289 Other specified depressive episodes: Secondary | ICD-10-CM | POA: Insufficient documentation

## 2012-07-20 DIAGNOSIS — K219 Gastro-esophageal reflux disease without esophagitis: Secondary | ICD-10-CM | POA: Insufficient documentation

## 2012-07-20 DIAGNOSIS — E109 Type 1 diabetes mellitus without complications: Secondary | ICD-10-CM | POA: Insufficient documentation

## 2012-07-20 DIAGNOSIS — Y939 Activity, unspecified: Secondary | ICD-10-CM | POA: Insufficient documentation

## 2012-07-20 DIAGNOSIS — Z8719 Personal history of other diseases of the digestive system: Secondary | ICD-10-CM | POA: Insufficient documentation

## 2012-07-20 DIAGNOSIS — S8990XA Unspecified injury of unspecified lower leg, initial encounter: Secondary | ICD-10-CM | POA: Insufficient documentation

## 2012-07-20 DIAGNOSIS — Z8669 Personal history of other diseases of the nervous system and sense organs: Secondary | ICD-10-CM | POA: Insufficient documentation

## 2012-07-20 DIAGNOSIS — Z9861 Coronary angioplasty status: Secondary | ICD-10-CM | POA: Insufficient documentation

## 2012-07-20 DIAGNOSIS — Z951 Presence of aortocoronary bypass graft: Secondary | ICD-10-CM | POA: Insufficient documentation

## 2012-07-20 DIAGNOSIS — Z79899 Other long term (current) drug therapy: Secondary | ICD-10-CM | POA: Insufficient documentation

## 2012-07-20 DIAGNOSIS — W010XXA Fall on same level from slipping, tripping and stumbling without subsequent striking against object, initial encounter: Secondary | ICD-10-CM | POA: Insufficient documentation

## 2012-07-20 DIAGNOSIS — E785 Hyperlipidemia, unspecified: Secondary | ICD-10-CM | POA: Insufficient documentation

## 2012-07-20 DIAGNOSIS — F329 Major depressive disorder, single episode, unspecified: Secondary | ICD-10-CM | POA: Insufficient documentation

## 2012-07-20 DIAGNOSIS — C9110 Chronic lymphocytic leukemia of B-cell type not having achieved remission: Secondary | ICD-10-CM | POA: Insufficient documentation

## 2012-07-20 DIAGNOSIS — I2510 Atherosclerotic heart disease of native coronary artery without angina pectoris: Secondary | ICD-10-CM | POA: Insufficient documentation

## 2012-07-20 DIAGNOSIS — M109 Gout, unspecified: Secondary | ICD-10-CM | POA: Insufficient documentation

## 2012-07-20 DIAGNOSIS — Y9289 Other specified places as the place of occurrence of the external cause: Secondary | ICD-10-CM | POA: Insufficient documentation

## 2012-07-20 DIAGNOSIS — Z862 Personal history of diseases of the blood and blood-forming organs and certain disorders involving the immune mechanism: Secondary | ICD-10-CM | POA: Insufficient documentation

## 2012-07-20 DIAGNOSIS — W19XXXA Unspecified fall, initial encounter: Secondary | ICD-10-CM

## 2012-07-20 LAB — CBC WITH DIFFERENTIAL/PLATELET
Basophils Absolute: 0.1 10*3/uL (ref 0.0–0.1)
Basophils Relative: 0 % (ref 0–1)
Eosinophils Absolute: 0.2 10*3/uL (ref 0.0–0.7)
Hemoglobin: 13.7 g/dL (ref 12.0–15.0)
MCH: 31.1 pg (ref 26.0–34.0)
MCHC: 33.9 g/dL (ref 30.0–36.0)
Monocytes Absolute: 1.1 10*3/uL — ABNORMAL HIGH (ref 0.1–1.0)
Monocytes Relative: 6 % (ref 3–12)
Neutro Abs: 13.6 10*3/uL — ABNORMAL HIGH (ref 1.7–7.7)
Neutrophils Relative %: 67 % (ref 43–77)
RDW: 15.8 % — ABNORMAL HIGH (ref 11.5–15.5)

## 2012-07-20 LAB — BASIC METABOLIC PANEL
BUN: 18 mg/dL (ref 6–23)
Creatinine, Ser: 0.8 mg/dL (ref 0.50–1.10)
GFR calc Af Amer: 79 mL/min — ABNORMAL LOW (ref >90)
GFR calc non Af Amer: 68 mL/min — ABNORMAL LOW (ref >90)
Glucose, Bld: 412 mg/dL — ABNORMAL HIGH (ref 70–99)

## 2012-07-20 LAB — URINALYSIS, ROUTINE W REFLEX MICROSCOPIC
Bilirubin Urine: NEGATIVE
Hgb urine dipstick: NEGATIVE
Nitrite: NEGATIVE
Protein, ur: NEGATIVE mg/dL
Specific Gravity, Urine: 1.015 (ref 1.005–1.030)
Urobilinogen, UA: 0.2 mg/dL (ref 0.0–1.0)

## 2012-07-20 NOTE — ED Notes (Addendum)
Pt is becoming agitated. Pt denies any needs. EDP and RN have talked with pt and given multiple updates and attempted comfort measures. ED staff has repositioned pt several times. Pt refuses to stay in bed and is sitting on the edge of the bed. Pt has red socks and arm band on. Charge RN aware.

## 2012-07-20 NOTE — ED Provider Notes (Addendum)
History  This chart was scribed for Amanda Hutching, MD by Bennett Scrape, ED Scribe. This patient was seen in room APA14/APA14 and the patient's care was started at 11:36 AM.  CSN: [id number]  Arrival date & time 07/20/12  1124   First MD Initiated Contact with Patient 07/20/12 1136     Level 5 Caveat- H/O Dementia   Chief Complaint  Patient presents with  . Fall    The history is provided by the patient. The history is limited by the condition of the patient. No language interpreter was used.    [name] is a 79 y.o. female with a h/o Dementia brought in by ambulance from [facility] , who presents to the Emergency Department complaining of an unwitnessed fall off the toilet this morning. Pt states that she just slipped off and fell onto the floor. She states that she wasn't ambulatory after the incident but reports that she had a phone in her bathroom that she used to call EMS. She denies head injury, hip pain, leg pain, neck pain and back pain as associated symptoms. She also has a h/o HTN, HLD, DM, A. FIb and CVA. She denies smoking and alcohol use.  Per nursing note, pt c/o left knee pain; however, she denies having any pain in the room.  Lives alone.  PCP is Dr. Dwana Melena.  Past Medical History  Diagnosis Date  . HTN (hypertension)   . Coronary atherosclerosis of unspecified type of vessel, native or graft     a. remote CABG in 1999 with Dr. Laneta Simmers. b. s/p PCI with stent to LCX in 2009 (Platinum protocol study). c. NSTEMI 10/2011 s/p BMS to SVG->diagonal.  . Personal history of other diseases of digestive system   . Gout, unspecified     "have had it in my hands and my feet"  . Unspecified hereditary and idiopathic peripheral neuropathy   . Esophageal reflux   . Anemia   . GI bleed   . Leukocytosis 03/16/2011  . Hyperlipidemia     Statin intolerant  . Pancreas disorder     prior pancreatectomy in 2011 complicated by leak requiring prolong and repeated  drainage and subsequent pancreatic stent  . Other chronic pulmonary heart diseases   . Type I (juvenile type) diabetes mellitus without mention of complication, not stated as uncontrolled     Previously on insulin pump  . Permanent atrial fibrillation   . Anginal pain 11/24/11    "1st time"  . Exertional dyspnea 11/24/11  . Depression   . 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 CLL (chronic lymphocytic leukemia)     seeing Dr. [name]  . Myocardial infarction 1999    "on the verge of one"  . NSTEMI (non-ST elevated myocardial infarction) 10/2011    /H&P  . Stroke   . Atrial fibrillation   . Depression   . Acid reflux     Past Surgical History  Procedure Laterality Date  . Oophorectomy  1970's    w hysterectomy and bladder tacking procedure  . Belpharoptosis repair      right upper eyelid  . Esophagogastroduodenoscopy  07/11/2010  . Colonoscopy  10/20/06  . Tonsillectomy  1947  . Appendectomy  1970's?  . Abdominal hysterectomy  1970's  . Coronary angioplasty with stent placement  10/2011    "makes total of 4"; 20-30% ostial left main stenosis; occluded ostial LAD supplied by IMA graft, 20-30% scattered LCx stenoses with widely patent stent and 20%  narrowing; diffuse RCA luminal irregularities up to 20-30%; SVG-diagonal patent, but proximal 90% stenosis s/p BMS; patent LIMA-LAD; LVEF 55-65%.  . Coronary angioplasty with stent placement  2009    LCX  . Cataract extraction w/ intraocular lens  implant, bilateral  ~ 2003  . Coronary artery bypass graft  1999    LIMA to LAD, SVG to the DX  . Pancreatectomy  2011    c/b leak requiring multiple drainage attempts and stent placement), /H&P  . Cardiac surgery    . Colonoscopy  01/20/2012    Procedure: COLONOSCOPY;  Surgeon: Malissa Hippo, MD;  Location: [facility];  Service: Endoscopy;  Laterality: N/A;    Family History  Problem Relation Age of Onset  . Stroke Mother   . Lung cancer Father   . Healthy  Son     History  Substance Use Topics  . Smoking status: Never Smoker   . Smokeless tobacco: Never Used  . Alcohol Use: No    No OB history provided.  Review of Systems  Unable to perform ROS: Dementia    Allergies  Codeine; Ivp dye; Morphine; Prednisone; and Statins  Home Medications   Current Outpatient Rx  Name  Route  Sig  Dispense  Refill  . docusate sodium (COLACE) 100 MG capsule   Oral   Take 100 mg by mouth daily.          Marland Kitchen ezetimibe (ZETIA) 10 MG tablet   Oral   Take 10 mg by mouth daily.         . feeding supplement (PRO-STAT SUGAR FREE 64) LIQD   Oral   Take 30 mLs by mouth daily.         . insulin aspart (NOVOLOG) 100 UNIT/ML injection   Subcutaneous   Inject 10 Units into the skin 2 (two) times daily. Before lunch and dinner if eating         . insulin glargine (LANTUS) 100 UNIT/ML injection   Subcutaneous   Inject 32 Units into the skin at bedtime.         . magnesium oxide (MAG-OX) 400 MG tablet   Oral   Take 400 mg by mouth 2 (two) times daily.         . megestrol (MEGACE) 40 MG/ML suspension   Oral   Take 400 mg by mouth every morning.         . metoprolol tartrate (LOPRESSOR) 25 MG tablet   Oral   Take 12.5 mg by mouth 2 (two) times daily.          . Multiple Vitamin (DAILY VITE PO)   Oral   Take 1 tablet by mouth daily.         . nitrofurantoin (MACRODANTIN) 100 MG capsule   Oral   Take 100 mg by mouth 2 (two) times daily.         Marland Kitchen olmesartan (BENICAR) 40 MG tablet   Oral   Take 40 mg by mouth daily.         . polyethylene glycol (MIRALAX / GLYCOLAX) packet   Oral   Take 17 g by mouth daily.         . Rivaroxaban (XARELTO) 20 MG TABS   Oral   Take 20 mg by mouth daily.         . vitamin C (ASCORBIC ACID) 500 MG tablet   Oral   Take 500 mg by mouth 2 (two) times daily.         Marland Kitchen  Vitamin D, Ergocalciferol, (DRISDOL) 50000 UNITS CAPS   Oral   Take 50,000 Units by mouth every 7 (seven) days.  On Thursday         . colchicine 0.6 MG tablet   Oral   Take 0.6 mg by mouth daily as needed. Gout Flare         . nitroGLYCERIN (NITROSTAT) 0.4 MG SL tablet   Sublingual   Place 1 tablet (0.4 mg total) under the tongue every 5 (five) minutes x 3 doses as needed for chest pain.   25 tablet   4   . ondansetron (ZOFRAN) 4 MG tablet   Oral   Take 1 tablet (4 mg total) by mouth every 8 (eight) hours as needed for nausea.   30 tablet   1     Triage Vitals: BP 143/79  Pulse 98  Temp(Src) 97.6 F (36.4 C) (Oral)  Resp 18  Ht 5\' 4"  (1.626 m)  SpO2 98%  Physical Exam  Nursing note and vitals reviewed. Constitutional: She is oriented to person, place, and time. She appears well-developed and well-nourished.  HENT:  Head: Normocephalic and atraumatic.  No obvious signs of trauma  Eyes: Conjunctivae and EOM are normal. Pupils are equal, round, and reactive to light.  Neck: Normal range of motion. Neck supple.  No posterior c-spine tenderness, no obvious signs of trauma  Cardiovascular: Normal rate, regular rhythm and normal heart sounds.   Pulmonary/Chest: Effort normal and breath sounds normal.  Abdominal: Soft. Bowel sounds are normal.  Musculoskeletal: Normal range of motion.  Hips are stable, no extremity pain, pt moves all 4 extremities    Neurological: She is alert and oriented to person, place, and time.  Skin: Skin is warm and dry.  Psychiatric: She has a normal mood and affect.    ED Course  Procedures (including critical care time)  DIAGNOSTIC STUDIES: Oxygen Saturation is 98% on room air, normal by my interpretation.    COORDINATION OF CARE: 12:08 PM-Discussed treatment plan which includes EKG, CBC panel, and BMP with pt at bedside and pt agreed to plan.    Results for orders placed during the hospital encounter of 07/20/12  BASIC METABOLIC PANEL      Result Value Range   Sodium 133 (*) 135 - 145 mEq/L   Potassium 4.0  3.5 - 5.1 mEq/L   Chloride 97  96  - 112 mEq/L   CO2 26  19 - 32 mEq/L   Glucose, Bld 412 (*) 70 - 99 mg/dL   BUN 18  6 - 23 mg/dL   Creatinine, Ser 8.11  0.50 - 1.10 mg/dL   Calcium 9.8  8.4 - 91.4 mg/dL   GFR calc non Af Amer 68 (*) >90 mL/min   GFR calc Af Amer 79 (*) >90 mL/min  CBC WITH DIFFERENTIAL      Result Value Range   WBC 20.3 (*) 4.0 - 10.5 K/uL   RBC 4.40  3.87 - 5.11 MIL/uL   Hemoglobin 13.7  12.0 - 15.0 g/dL   HCT 78.2  95.6 - 21.3 %   MCV 91.8  78.0 - 100.0 fL   MCH 31.1  26.0 - 34.0 pg   MCHC 33.9  30.0 - 36.0 g/dL   RDW 08.6 (*) 57.8 - 46.9 %   Platelets 398  150 - 400 K/uL   Neutrophils Relative 67  43 - 77 %   Neutro Abs 13.6 (*) 1.7 - 7.7 K/uL   Lymphocytes Relative 26  12 - 46 %   Lymphs Abs 5.3 (*) 0.7 - 4.0 K/uL   Monocytes Relative 6  3 - 12 %   Monocytes Absolute 1.1 (*) 0.1 - 1.0 K/uL   Eosinophils Relative 1  0 - 5 %   Eosinophils Absolute 0.2  0.0 - 0.7 K/uL   Basophils Relative 0  0 - 1 %   Basophils Absolute 0.1  0.0 - 0.1 K/uL  URINALYSIS, ROUTINE W REFLEX MICROSCOPIC      Result Value Range   Color, Urine YELLOW  YELLOW   APPearance CLEAR  CLEAR   Specific Gravity, Urine 1.015  1.005 - 1.030   pH 6.5  5.0 - 8.0   Glucose, UA >1000 (*) NEGATIVE mg/dL   Hgb urine dipstick NEGATIVE  NEGATIVE   Bilirubin Urine NEGATIVE  NEGATIVE   Ketones, ur NEGATIVE  NEGATIVE mg/dL   Protein, ur NEGATIVE  NEGATIVE mg/dL   Urobilinogen, UA 0.2  0.0 - 1.0 mg/dL   Nitrite NEGATIVE  NEGATIVE   Leukocytes, UA NEGATIVE  NEGATIVE  URINE MICROSCOPIC-ADD ON      Result Value Range   Squamous Epithelial / LPF RARE  RARE   WBC, UA 0-2  <3 WBC/hpf   RBC / HPF 0-2  <3 RBC/hpf   Labs Reviewed - No data to display No results found.   No diagnosis found.    MDM  Accidental fall off  Toilet seat.   Patient has no bony tenderness, head or neck trauma.  No cough or dysuria. Elevated white count noted, no clinical significance. Urinalysis normal. No evidence of pneumonia. Patient wants to go  home.    I personally performed the services described in this documentation, which was scribed in my presence. The recorded information has been reviewed and is accurate.      Amanda Hutching, MD 07/20/12 1504  Amanda Hutching, MD 07/20/12 [phone]

## 2012-07-20 NOTE — ED Notes (Addendum)
Per EMS, pt from [facility]. Per EMS, pt had an unwitnessed fall. Pt reports she "slipped" off the toilet this am. Pt complaining of left knee pain. Pt has small bruise noted to top of head. Pt has hx of dementia. Pt alert and oriented. Nad noted.

## 2012-07-20 NOTE — ED Notes (Signed)
Shanda Bumps from [facility] called for an update on pt status. Shanda Bumps informed that pt is awaiting urine sample results.  Shanda Bumps reported that pt is already on an abx for a UTI and has three more doses to go.EDP aware. No new orders given at this time.

## 2012-07-24 ENCOUNTER — Encounter (HOSPITAL_COMMUNITY): Payer: Self-pay

## 2012-07-24 ENCOUNTER — Emergency Department (HOSPITAL_COMMUNITY)
Admission: EM | Admit: 2012-07-24 | Discharge: 2012-07-24 | Disposition: A | Discharge status: Home / Self Care | Payer: Medicare Other | Attending: Emergency Medicine | Admitting: Emergency Medicine

## 2012-07-24 DIAGNOSIS — F0390 Unspecified dementia without behavioral disturbance: Secondary | ICD-10-CM | POA: Insufficient documentation

## 2012-07-24 DIAGNOSIS — Z862 Personal history of diseases of the blood and blood-forming organs and certain disorders involving the immune mechanism: Secondary | ICD-10-CM | POA: Insufficient documentation

## 2012-07-24 DIAGNOSIS — E109 Type 1 diabetes mellitus without complications: Secondary | ICD-10-CM | POA: Insufficient documentation

## 2012-07-24 DIAGNOSIS — F3289 Other specified depressive episodes: Secondary | ICD-10-CM | POA: Insufficient documentation

## 2012-07-24 DIAGNOSIS — Z794 Long term (current) use of insulin: Secondary | ICD-10-CM | POA: Insufficient documentation

## 2012-07-24 DIAGNOSIS — Z8669 Personal history of other diseases of the nervous system and sense organs: Secondary | ICD-10-CM | POA: Insufficient documentation

## 2012-07-24 DIAGNOSIS — K219 Gastro-esophageal reflux disease without esophagitis: Secondary | ICD-10-CM | POA: Insufficient documentation

## 2012-07-24 DIAGNOSIS — Z8679 Personal history of other diseases of the circulatory system: Secondary | ICD-10-CM | POA: Insufficient documentation

## 2012-07-24 DIAGNOSIS — C9110 Chronic lymphocytic leukemia of B-cell type not having achieved remission: Secondary | ICD-10-CM | POA: Insufficient documentation

## 2012-07-24 DIAGNOSIS — Z8709 Personal history of other diseases of the respiratory system: Secondary | ICD-10-CM | POA: Insufficient documentation

## 2012-07-24 DIAGNOSIS — F911 Conduct disorder, childhood-onset type: Secondary | ICD-10-CM | POA: Insufficient documentation

## 2012-07-24 DIAGNOSIS — Z79899 Other long term (current) drug therapy: Secondary | ICD-10-CM | POA: Insufficient documentation

## 2012-07-24 DIAGNOSIS — R4689 Other symptoms and signs involving appearance and behavior: Secondary | ICD-10-CM

## 2012-07-24 DIAGNOSIS — M109 Gout, unspecified: Secondary | ICD-10-CM | POA: Insufficient documentation

## 2012-07-24 DIAGNOSIS — F329 Major depressive disorder, single episode, unspecified: Secondary | ICD-10-CM | POA: Insufficient documentation

## 2012-07-24 DIAGNOSIS — I10 Essential (primary) hypertension: Secondary | ICD-10-CM | POA: Insufficient documentation

## 2012-07-24 DIAGNOSIS — I2510 Atherosclerotic heart disease of native coronary artery without angina pectoris: Secondary | ICD-10-CM | POA: Insufficient documentation

## 2012-07-24 DIAGNOSIS — Z8719 Personal history of other diseases of the digestive system: Secondary | ICD-10-CM | POA: Insufficient documentation

## 2012-07-24 DIAGNOSIS — Z951 Presence of aortocoronary bypass graft: Secondary | ICD-10-CM | POA: Insufficient documentation

## 2012-07-24 DIAGNOSIS — I252 Old myocardial infarction: Secondary | ICD-10-CM | POA: Insufficient documentation

## 2012-07-24 DIAGNOSIS — Z859 Personal history of malignant neoplasm, unspecified: Secondary | ICD-10-CM | POA: Insufficient documentation

## 2012-07-24 DIAGNOSIS — Z8673 Personal history of transient ischemic attack (TIA), and cerebral infarction without residual deficits: Secondary | ICD-10-CM | POA: Insufficient documentation
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
## 2012-07-24 NOTE — ED Notes (Addendum)
Pt up for d/c in bed with both side rails up. Pt has yellow arm band on. Pt found on the floor, lying on her side. EDP aware and assessed pt prior to d/c. Pt cleared and still up for d/c. Pt has small abrasions noted to back. No active bleeding noted. Charge RN aware. [facility] Portal completed.

## 2012-07-29 ENCOUNTER — Emergency Department (HOSPITAL_COMMUNITY)
Admission: EM | Admit: 2012-07-29 | Discharge: 2012-07-29 | Disposition: A | Discharge status: Skilled Nursing Facility | Payer: Medicare Other | Attending: Emergency Medicine | Admitting: Emergency Medicine

## 2012-07-29 ENCOUNTER — Encounter (HOSPITAL_COMMUNITY): Payer: Self-pay | Admitting: *Deleted

## 2012-07-29 DIAGNOSIS — Z8669 Personal history of other diseases of the nervous system and sense organs: Secondary | ICD-10-CM | POA: Insufficient documentation

## 2012-07-29 DIAGNOSIS — Z794 Long term (current) use of insulin: Secondary | ICD-10-CM | POA: Insufficient documentation

## 2012-07-29 DIAGNOSIS — K219 Gastro-esophageal reflux disease without esophagitis: Secondary | ICD-10-CM | POA: Insufficient documentation

## 2012-07-29 DIAGNOSIS — E1069 Type 1 diabetes mellitus with other specified complication: Secondary | ICD-10-CM | POA: Insufficient documentation

## 2012-07-29 DIAGNOSIS — R5383 Other fatigue: Secondary | ICD-10-CM | POA: Insufficient documentation

## 2012-07-29 DIAGNOSIS — I10 Essential (primary) hypertension: Secondary | ICD-10-CM | POA: Insufficient documentation

## 2012-07-29 DIAGNOSIS — Z8719 Personal history of other diseases of the digestive system: Secondary | ICD-10-CM | POA: Insufficient documentation

## 2012-07-29 DIAGNOSIS — Z951 Presence of aortocoronary bypass graft: Secondary | ICD-10-CM | POA: Insufficient documentation

## 2012-07-29 DIAGNOSIS — I252 Old myocardial infarction: Secondary | ICD-10-CM | POA: Insufficient documentation

## 2012-07-29 DIAGNOSIS — Z8673 Personal history of transient ischemic attack (TIA), and cerebral infarction without residual deficits: Secondary | ICD-10-CM | POA: Insufficient documentation

## 2012-07-29 DIAGNOSIS — F329 Major depressive disorder, single episode, unspecified: Secondary | ICD-10-CM | POA: Insufficient documentation

## 2012-07-29 DIAGNOSIS — Z79899 Other long term (current) drug therapy: Secondary | ICD-10-CM | POA: Insufficient documentation

## 2012-07-29 DIAGNOSIS — Z8679 Personal history of other diseases of the circulatory system: Secondary | ICD-10-CM | POA: Insufficient documentation

## 2012-07-29 DIAGNOSIS — I2510 Atherosclerotic heart disease of native coronary artery without angina pectoris: Secondary | ICD-10-CM | POA: Insufficient documentation

## 2012-07-29 DIAGNOSIS — Z8639 Personal history of other endocrine, nutritional and metabolic disease: Secondary | ICD-10-CM | POA: Insufficient documentation

## 2012-07-29 DIAGNOSIS — R5381 Other malaise: Secondary | ICD-10-CM | POA: Insufficient documentation

## 2012-07-29 DIAGNOSIS — Z862 Personal history of diseases of the blood and blood-forming organs and certain disorders involving the immune mechanism: Secondary | ICD-10-CM | POA: Insufficient documentation

## 2012-07-29 DIAGNOSIS — R739 Hyperglycemia, unspecified: Secondary | ICD-10-CM

## 2012-07-29 DIAGNOSIS — F0390 Unspecified dementia without behavioral disturbance: Secondary | ICD-10-CM | POA: Insufficient documentation

## 2012-07-29 DIAGNOSIS — M109 Gout, unspecified: Secondary | ICD-10-CM | POA: Insufficient documentation

## 2012-07-29 DIAGNOSIS — F3289 Other specified depressive episodes: Secondary | ICD-10-CM | POA: Insufficient documentation

## 2012-07-29 HISTORY — DX: Unspecified dementia, unspecified severity, without behavioral disturbance, psychotic disturbance, mood disturbance, and anxiety: F03.90

## 2012-07-29 LAB — BASIC METABOLIC PANEL
BUN: 18 mg/dL (ref 6–23)
CO2: 24 mEq/L (ref 19–32)
Chloride: 101 mEq/L (ref 96–112)
Glucose, Bld: 395 mg/dL — ABNORMAL HIGH (ref 70–99)
Potassium: 4.1 mEq/L (ref 3.5–5.1)
Sodium: 136 mEq/L (ref 135–145)

## 2012-07-29 MED ORDER — INSULIN ASPART 100 UNIT/ML ~~LOC~~ SOLN
10.0000 [IU] | Freq: Once | SUBCUTANEOUS | Status: AC
  Administered 2012-07-29: 10 [IU] via SUBCUTANEOUS
  Filled 2012-07-29: qty 1

## 2012-07-29 NOTE — ED Notes (Signed)
[facility] and informed them that the patient was ready for discharge. They will have someone to come pick her up.

## 2012-07-29 NOTE — ED Provider Notes (Signed)
History     CSN: [id number]  Arrival date & time 07/29/12  1745   First MD Initiated Contact with Patient 07/29/12 1749      Chief Complaint  Patient presents with  . Hyperglycemia    (Consider location/radiation/quality/duration/timing/severity/associated sxs/prior treatment) Patient is a 79 y.o. female presenting with weakness. The history is provided by the EMS personnel (pt had a high sugar and was sent here from the home for evaluation). No language interpreter was used.  Weakness This is a new problem. The current episode started 12 to 24 hours ago. The problem occurs rarely. The problem has been resolved. Pertinent negatives include no chest pain, no abdominal pain and no headaches. Nothing aggravates the symptoms. Nothing relieves the symptoms. Treatments tried: pt given insulin to lower glucose.    Past Medical History  Diagnosis Date  . HTN (hypertension)   . Coronary atherosclerosis of unspecified type of vessel, native or graft     a. remote CABG in 1999 with Dr. Laneta Simmers. b. s/p PCI with stent to LCX in 2009 (Platinum protocol study). c. NSTEMI 10/2011 s/p BMS to SVG->diagonal.  . Personal history of other diseases of digestive system   . Gout, unspecified     "have had it in my hands and my feet"  . Unspecified hereditary and idiopathic peripheral neuropathy   . Esophageal reflux   . Anemia   . GI bleed   . Leukocytosis 03/16/2011  . Hyperlipidemia     Statin intolerant  . Pancreas disorder     prior pancreatectomy in 2011 complicated by leak requiring prolong and repeated drainage and subsequent pancreatic stent  . Other chronic pulmonary heart diseases   . Type I (juvenile type) diabetes mellitus without mention of complication, not stated as uncontrolled     Previously on insulin pump  . Permanent atrial fibrillation   . Anginal pain 11/24/11    "1st time"  . Exertional dyspnea 11/24/11  . Depression   . Primary malignant neuroendocrine tumor of pancreas  03/16/2011  . CLL (chronic lymphocytic leukemia)     seeing Dr. [name]  . Myocardial infarction 1999    "on the verge of one"  . NSTEMI (non-ST elevated myocardial infarction) 10/2011    /H&P  . Stroke   . Atrial fibrillation   . Depression   . Acid reflux   . Dementia     Past Surgical History  Procedure Laterality Date  . Oophorectomy  1970's    w hysterectomy and bladder tacking procedure  . Belpharoptosis repair      right upper eyelid  . Esophagogastroduodenoscopy  07/11/2010  . Colonoscopy  10/20/06  . Tonsillectomy  1947  . Appendectomy  1970's?  . Abdominal hysterectomy  1970's  . Coronary angioplasty with stent placement  10/2011    "makes total of 4"; 20-30% ostial left main stenosis; occluded ostial LAD supplied by IMA graft, 20-30% scattered LCx stenoses with widely patent stent and 20% narrowing; diffuse RCA luminal irregularities up to 20-30%; SVG-diagonal patent, but proximal 90% stenosis s/p BMS; patent LIMA-LAD; LVEF 55-65%.  . Coronary angioplasty with stent placement  2009    LCX  . Cataract extraction w/ intraocular lens  implant, bilateral  ~ 2003  . Coronary artery bypass graft  1999    LIMA to LAD, SVG to the DX  . Pancreatectomy  2011    c/b leak requiring multiple drainage attempts and stent placement), /H&P  . Cardiac surgery    . Colonoscopy  01/20/2012  Procedure: COLONOSCOPY;  Surgeon: Malissa Hippo, MD;  Location: [facility];  Service: Endoscopy;  Laterality: N/A;    Family History  Problem Relation Age of Onset  . Stroke Mother   . Lung cancer Father   . Healthy Son     History  Substance Use Topics  . Smoking status: Never Smoker   . Smokeless tobacco: Never Used  . Alcohol Use: No    OB History   Grav Para Term Preterm Abortions TAB SAB Ect Mult Living                  Review of Systems  Constitutional: Negative for appetite change and fatigue.  HENT: Negative for congestion, sinus pressure and ear discharge.   Eyes:  Negative for discharge.  Respiratory: Negative for cough.   Cardiovascular: Negative for chest pain.  Gastrointestinal: Negative for abdominal pain and diarrhea.  Genitourinary: Negative for frequency and hematuria.  Musculoskeletal: Negative for back pain.  Skin: Negative for rash.  Neurological: Positive for weakness. Negative for seizures and headaches.  Psychiatric/Behavioral: Negative for hallucinations.    Allergies  Codeine; Ivp dye; Morphine; Prednisone; and Statins  Home Medications   Current Outpatient Rx  Name  Route  Sig  Dispense  Refill  . docusate sodium (COLACE) 100 MG capsule   Oral   Take 100 mg by mouth daily.          Marland Kitchen ezetimibe (ZETIA) 10 MG tablet   Oral   Take 10 mg by mouth daily.         . feeding supplement (PRO-STAT SUGAR FREE 64) LIQD   Oral   Take 30 mLs by mouth daily.         . insulin aspart (NOVOLOG) 100 UNIT/ML injection   Subcutaneous   Inject 10 Units into the skin 2 (two) times daily. Before lunch and dinner if eating         . insulin glargine (LANTUS) 100 UNIT/ML injection   Subcutaneous   Inject 32 Units into the skin at bedtime.         Marland Kitchen LORazepam (ATIVAN) 0.5 MG tablet   Oral   Take 0.5 mg by mouth 2 (two) times daily as needed for anxiety (agitation).          . magnesium oxide (MAG-OX) 400 MG tablet   Oral   Take 400 mg by mouth 2 (two) times daily.         . megestrol (MEGACE) 40 MG/ML suspension   Oral   Take 400 mg by mouth every morning.         . metoprolol tartrate (LOPRESSOR) 25 MG tablet   Oral   Take 12.5 mg by mouth 2 (two) times daily.          . Multiple Vitamin (DAILY VITE PO)   Oral   Take 1 tablet by mouth daily.         . nitroGLYCERIN (NITROSTAT) 0.4 MG SL tablet   Sublingual   Place 1 tablet (0.4 mg total) under the tongue every 5 (five) minutes x 3 doses as needed for chest pain.   25 tablet   4   . olmesartan (BENICAR) 40 MG tablet   Oral   Take 40 mg by mouth  daily.         Marland Kitchen omeprazole (PRILOSEC) 20 MG capsule   Oral   Take 20 mg by mouth daily.         . ondansetron (  ZOFRAN) 4 MG tablet   Oral   Take 1 tablet (4 mg total) by mouth every 8 (eight) hours as needed for nausea.   30 tablet   1   . polyethylene glycol (MIRALAX / GLYCOLAX) packet   Oral   Take 17 g by mouth daily.         . QUEtiapine (SEROQUEL) 25 MG tablet   Oral   Take 25 mg by mouth at bedtime.         . Rivaroxaban (XARELTO) 20 MG TABS   Oral   Take 20 mg by mouth daily.         . vitamin C (ASCORBIC ACID) 500 MG tablet   Oral   Take 500 mg by mouth 2 (two) times daily.         . Vitamin D, Ergocalciferol, (DRISDOL) 50000 UNITS CAPS   Oral   Take 50,000 Units by mouth every 7 (seven) days. On Thursday         . colchicine 0.6 MG tablet   Oral   Take 0.6 mg by mouth daily as needed. Gout Flare           BP 173/87  Pulse 82  Temp(Src) 98.7 F (37.1 C) (Oral)  Resp 16  Ht 5\' 4"  (1.626 m)  Wt 120 lb (54.432 kg)  BMI 20.59 kg/m2  SpO2 98%  Physical Exam  Constitutional: She is oriented to person, place, and time. She appears well-developed.  HENT:  Head: Normocephalic.  Eyes: Conjunctivae and EOM are normal. No scleral icterus.  Neck: Neck supple. No thyromegaly present.  Cardiovascular: Normal rate and regular rhythm.  Exam reveals no gallop and no friction rub.   No murmur heard. Pulmonary/Chest: No stridor. She has no wheezes. She has no rales. She exhibits no tenderness.  Abdominal: She exhibits no distension. There is no tenderness. There is no rebound.  Musculoskeletal: Normal range of motion. She exhibits no edema.  Lymphadenopathy:    She has no cervical adenopathy.  Neurological: She is oriented to person, place, and time. Coordination normal.  Skin: No rash noted. No erythema.  Psychiatric: She has a normal mood and affect. Her behavior is normal.    ED Course  Procedures (including critical care time)  Labs  Reviewed  GLUCOSE, CAPILLARY - Abnormal; Notable for the following:    Glucose-Capillary 450 (*)    All other components within normal limits  BASIC METABOLIC PANEL - Abnormal; Notable for the following:    Glucose, Bld 395 (*)    GFR calc non Af Amer 77 (*)    GFR calc Af Amer 89 (*)    All other components within normal limits  GLUCOSE, CAPILLARY - Abnormal; Notable for the following:    Glucose-Capillary 326 (*)    All other components within normal limits   No results found.   1. Hyperglycemia       MDM          [name], MD 07/29/12 [phone]

## 2012-07-29 NOTE — ED Notes (Signed)
Pt from [facility], FS over 600 for nursing home staff, gave 10 units novolog, last FS 569 per EMS, 18G LFA

## 2012-08-03 ENCOUNTER — Encounter (HOSPITAL_COMMUNITY): Payer: Self-pay

## 2012-08-03 ENCOUNTER — Emergency Department (HOSPITAL_COMMUNITY)
Admission: EM | Admit: 2012-08-03 | Discharge: 2012-08-03 | Disposition: A | Discharge status: Skilled Nursing Facility | Payer: Medicare Other | Attending: Emergency Medicine | Admitting: Emergency Medicine

## 2012-08-03 DIAGNOSIS — I4891 Unspecified atrial fibrillation: Secondary | ICD-10-CM | POA: Insufficient documentation

## 2012-08-03 DIAGNOSIS — Z8673 Personal history of transient ischemic attack (TIA), and cerebral infarction without residual deficits: Secondary | ICD-10-CM | POA: Insufficient documentation

## 2012-08-03 DIAGNOSIS — Z794 Long term (current) use of insulin: Secondary | ICD-10-CM | POA: Insufficient documentation

## 2012-08-03 DIAGNOSIS — I2510 Atherosclerotic heart disease of native coronary artery without angina pectoris: Secondary | ICD-10-CM | POA: Insufficient documentation

## 2012-08-03 DIAGNOSIS — E785 Hyperlipidemia, unspecified: Secondary | ICD-10-CM | POA: Insufficient documentation

## 2012-08-03 DIAGNOSIS — Z8679 Personal history of other diseases of the circulatory system: Secondary | ICD-10-CM | POA: Insufficient documentation

## 2012-08-03 DIAGNOSIS — I10 Essential (primary) hypertension: Secondary | ICD-10-CM | POA: Insufficient documentation

## 2012-08-03 DIAGNOSIS — Z8719 Personal history of other diseases of the digestive system: Secondary | ICD-10-CM | POA: Insufficient documentation

## 2012-08-03 DIAGNOSIS — Z9861 Coronary angioplasty status: Secondary | ICD-10-CM | POA: Insufficient documentation

## 2012-08-03 DIAGNOSIS — F3289 Other specified depressive episodes: Secondary | ICD-10-CM | POA: Insufficient documentation

## 2012-08-03 DIAGNOSIS — F329 Major depressive disorder, single episode, unspecified: Secondary | ICD-10-CM | POA: Insufficient documentation

## 2012-08-03 DIAGNOSIS — M109 Gout, unspecified: Secondary | ICD-10-CM | POA: Insufficient documentation

## 2012-08-03 DIAGNOSIS — K219 Gastro-esophageal reflux disease without esophagitis: Secondary | ICD-10-CM | POA: Insufficient documentation

## 2012-08-03 DIAGNOSIS — Z859 Personal history of malignant neoplasm, unspecified: Secondary | ICD-10-CM | POA: Insufficient documentation

## 2012-08-03 DIAGNOSIS — C9110 Chronic lymphocytic leukemia of B-cell type not having achieved remission: Secondary | ICD-10-CM | POA: Insufficient documentation

## 2012-08-03 DIAGNOSIS — F0390 Unspecified dementia without behavioral disturbance: Secondary | ICD-10-CM | POA: Insufficient documentation

## 2012-08-03 DIAGNOSIS — Z951 Presence of aortocoronary bypass graft: Secondary | ICD-10-CM | POA: Insufficient documentation

## 2012-08-03 DIAGNOSIS — I252 Old myocardial infarction: Secondary | ICD-10-CM | POA: Insufficient documentation

## 2012-08-03 DIAGNOSIS — N390 Urinary tract infection, site not specified: Secondary | ICD-10-CM | POA: Insufficient documentation

## 2012-08-03 DIAGNOSIS — Z8669 Personal history of other diseases of the nervous system and sense organs: Secondary | ICD-10-CM | POA: Insufficient documentation

## 2012-08-03 DIAGNOSIS — Z862 Personal history of diseases of the blood and blood-forming organs and certain disorders involving the immune mechanism: Secondary | ICD-10-CM | POA: Insufficient documentation

## 2012-08-03 DIAGNOSIS — E109 Type 1 diabetes mellitus without complications: Secondary | ICD-10-CM | POA: Insufficient documentation

## 2012-08-03 DIAGNOSIS — Z79899 Other long term (current) drug therapy: Secondary | ICD-10-CM | POA: Insufficient documentation

## 2012-08-03 LAB — BASIC METABOLIC PANEL
BUN: 28 mg/dL — ABNORMAL HIGH (ref 6–23)
Chloride: 102 mEq/L (ref 96–112)
Creatinine, Ser: 0.78 mg/dL (ref 0.50–1.10)
GFR calc Af Amer: 89 mL/min — ABNORMAL LOW (ref >90)
GFR calc non Af Amer: 77 mL/min — ABNORMAL LOW (ref >90)
Potassium: 4 mEq/L (ref 3.5–5.1)

## 2012-08-03 LAB — URINALYSIS, ROUTINE W REFLEX MICROSCOPIC
Bilirubin Urine: NEGATIVE
Ketones, ur: NEGATIVE mg/dL
Nitrite: NEGATIVE
Urobilinogen, UA: 0.2 mg/dL (ref 0.0–1.0)
pH: 8.5 — ABNORMAL HIGH (ref 5.0–8.0)

## 2012-08-03 LAB — CBC WITH DIFFERENTIAL/PLATELET
Basophils Absolute: 0 10*3/uL (ref 0.0–0.1)
Eosinophils Relative: 1 % (ref 0–5)
Monocytes Absolute: 1.1 10*3/uL — ABNORMAL HIGH (ref 0.1–1.0)
Monocytes Relative: 5 % (ref 3–12)
Neutrophils Relative %: 66 % (ref 43–77)
Platelets: 401 10*3/uL — ABNORMAL HIGH (ref 150–400)
RBC: 4.42 MIL/uL (ref 3.87–5.11)
RDW: 16.2 % — ABNORMAL HIGH (ref 11.5–15.5)
WBC: 22 10*3/uL — ABNORMAL HIGH (ref 4.0–10.5)

## 2012-08-03 LAB — URINE MICROSCOPIC-ADD ON

## 2012-08-03 NOTE — ED Notes (Signed)
Pt began yelling out and attempting to get oob. Assisted by staff into recliner. Pt asking for the police to be called.  Staff member at pts side.

## 2012-08-03 NOTE — ED Notes (Signed)
Pt arrived by ems from [facility], ?uti.

## 2012-08-03 NOTE — ED Provider Notes (Signed)
History  This chart was scribed for Dione Booze, MD by Shari Heritage and Lacey Jensen, ED Scribe. The patient was seen in room APA03/APA03. Patient's care was started at 1619.  CSN: [id number]  Arrival date & time 08/03/12  1613   First MD Initiated Contact with Patient 08/03/12 1619      Chief Complaint  Patient presents with  . Urinary Tract Infection     The history is limited by the condition of the patient.   HPI Comments Level 5 Caveat: Unable to obtain full history due to patient's dementia [name] is a 79 y.o. female with h/o HTN, pancreatic disorder, dementia, atrial fibrillation who presents to the Emergency Department from [facility] via EMS of a concern of possible UTI. Per [facility] records, patient was positive for a vancomycin sensitive organism, but urinalysis was completely normal.    Past Medical History  Diagnosis Date  . HTN (hypertension)   . Coronary atherosclerosis of unspecified type of vessel, native or graft     a. remote CABG in 1999 with Dr. Laneta Simmers. b. s/p PCI with stent to LCX in 2009 (Platinum protocol study). c. NSTEMI 10/2011 s/p BMS to SVG->diagonal.  . Personal history of other diseases of digestive system   . Gout, unspecified     "have had it in my hands and my feet"  . Unspecified hereditary and idiopathic peripheral neuropathy   . Esophageal reflux   . Anemia   . GI bleed   . Leukocytosis 03/16/2011  . Hyperlipidemia     Statin intolerant  . Pancreas disorder     prior pancreatectomy in 2011 complicated by leak requiring prolong and repeated drainage and subsequent pancreatic stent  . Other chronic pulmonary heart diseases   . Type I (juvenile type) diabetes mellitus without mention of complication, not stated as uncontrolled     Previously on insulin pump  . Permanent atrial fibrillation   . Anginal pain 11/24/11    "1st time"  . Exertional dyspnea 11/24/11  . Depression   . Primary malignant neuroendocrine tumor of  pancreas 03/16/2011  . CLL (chronic lymphocytic leukemia)     seeing Dr. [name]  . Myocardial infarction 1999    "on the verge of one"  . NSTEMI (non-ST elevated myocardial infarction) 10/2011    /H&P  . Stroke   . Atrial fibrillation   . Depression   . Acid reflux   . Dementia     Past Surgical History  Procedure Laterality Date  . Oophorectomy  1970's    w hysterectomy and bladder tacking procedure  . Belpharoptosis repair      right upper eyelid  . Esophagogastroduodenoscopy  07/11/2010  . Colonoscopy  10/20/06  . Tonsillectomy  1947  . Appendectomy  1970's?  . Abdominal hysterectomy  1970's  . Coronary angioplasty with stent placement  10/2011    "makes total of 4"; 20-30% ostial left main stenosis; occluded ostial LAD supplied by IMA graft, 20-30% scattered LCx stenoses with widely patent stent and 20% narrowing; diffuse RCA luminal irregularities up to 20-30%; SVG-diagonal patent, but proximal 90% stenosis s/p BMS; patent LIMA-LAD; LVEF 55-65%.  . Coronary angioplasty with stent placement  2009    LCX  . Cataract extraction w/ intraocular lens  implant, bilateral  ~ 2003  . Coronary artery bypass graft  1999    LIMA to LAD, SVG to the DX  . Pancreatectomy  2011    c/b leak requiring multiple drainage attempts and  stent placement), /H&P  . Cardiac surgery    . Colonoscopy  01/20/2012    Procedure: COLONOSCOPY;  Surgeon: Malissa Hippo, MD;  Location: [facility];  Service: Endoscopy;  Laterality: N/A;    Family History  Problem Relation Age of Onset  . Stroke Mother   . Lung cancer Father   . Healthy Son     History  Substance Use Topics  . Smoking status: Never Smoker   . Smokeless tobacco: Never Used  . Alcohol Use: No    OB History   Grav Para Term Preterm Abortions TAB SAB Ect Mult Living                  Review of Systems Level 5 Caveat - Unable to complete ROS due to patient's dementia.  Allergies  Codeine; Ivp dye; Morphine; Prednisone; and  Statins  Home Medications   Current Outpatient Rx  Name  Route  Sig  Dispense  Refill  . colchicine 0.6 MG tablet   Oral   Take 0.6 mg by mouth daily as needed. Gout Flare         . docusate sodium (COLACE) 100 MG capsule   Oral   Take 100 mg by mouth daily.          Marland Kitchen ezetimibe (ZETIA) 10 MG tablet   Oral   Take 10 mg by mouth daily.         . feeding supplement (PRO-STAT SUGAR FREE 64) LIQD   Oral   Take 30 mLs by mouth daily.         . insulin aspart (NOVOLOG) 100 UNIT/ML injection   Subcutaneous   Inject 10 Units into the skin 2 (two) times daily. Before lunch and dinner if eating         . insulin glargine (LANTUS) 100 UNIT/ML injection   Subcutaneous   Inject 32 Units into the skin at bedtime.         Marland Kitchen LORazepam (ATIVAN) 0.5 MG tablet   Oral   Take 0.5 mg by mouth 2 (two) times daily as needed for anxiety (agitation).          . magnesium oxide (MAG-OX) 400 MG tablet   Oral   Take 400 mg by mouth 2 (two) times daily.         . megestrol (MEGACE) 40 MG/ML suspension   Oral   Take 400 mg by mouth every morning.         . metoprolol tartrate (LOPRESSOR) 25 MG tablet   Oral   Take 12.5 mg by mouth 2 (two) times daily.          . Multiple Vitamin (DAILY VITE PO)   Oral   Take 1 tablet by mouth daily.         . nitroGLYCERIN (NITROSTAT) 0.4 MG SL tablet   Sublingual   Place 1 tablet (0.4 mg total) under the tongue every 5 (five) minutes x 3 doses as needed for chest pain.   25 tablet   4   . olmesartan (BENICAR) 40 MG tablet   Oral   Take 40 mg by mouth daily.         Marland Kitchen omeprazole (PRILOSEC) 20 MG capsule   Oral   Take 20 mg by mouth daily.         . ondansetron (ZOFRAN) 4 MG tablet   Oral   Take 1 tablet (4 mg total) by mouth every 8 (eight) hours as needed  for nausea.   30 tablet   1   . polyethylene glycol (MIRALAX / GLYCOLAX) packet   Oral   Take 17 g by mouth daily.         . QUEtiapine (SEROQUEL) 25 MG  tablet   Oral   Take 25 mg by mouth at bedtime.         . Rivaroxaban (XARELTO) 20 MG TABS   Oral   Take 20 mg by mouth daily.         . vitamin C (ASCORBIC ACID) 500 MG tablet   Oral   Take 500 mg by mouth 2 (two) times daily.         . Vitamin D, Ergocalciferol, (DRISDOL) 50000 UNITS CAPS   Oral   Take 50,000 Units by mouth every 7 (seven) days. On Thursday           Triage Vitals: BP 135/70  Pulse 98  Temp(Src) 98.3 F (36.8 C) (Oral)  Resp 20  SpO2 100%  Physical Exam  Constitutional: She appears well-developed and well-nourished.  HENT:  Head: Normocephalic and atraumatic.  Eyes: Conjunctivae and EOM are normal. Pupils are equal, round, and reactive to light.  Neck: Normal range of motion. Neck supple.  Cardiovascular: Normal heart sounds.   Abdominal: Soft. Bowel sounds are normal.  Musculoskeletal: Normal range of motion.  Neurological: She is alert.  Oriented to person and place but not time.   Skin: Skin is warm and dry.    ED Course  Procedures (including critical care time) DIAGNOSTIC STUDIES: Oxygen Saturation is 100% on room air, normal by my interpretation.    COORDINATION OF CARE: 4:44 PM- Patient informed of current plan for treatment and evaluation and agrees with plan at this time.   Results for orders placed during the hospital encounter of 08/03/12  CBC WITH DIFFERENTIAL      Result Value Range   WBC 22.0 (*) 4.0 - 10.5 K/uL   RBC 4.42  3.87 - 5.11 MIL/uL   Hemoglobin 14.2  12.0 - 15.0 g/dL   HCT 40.9  81.1 - 91.4 %   MCV 91.6  78.0 - 100.0 fL   MCH 32.1  26.0 - 34.0 pg   MCHC 35.1  30.0 - 36.0 g/dL   RDW 78.2 (*) 95.6 - 21.3 %   Platelets 401 (*) 150 - 400 K/uL   Neutrophils Relative 66  43 - 77 %   Lymphocytes Relative 28  12 - 46 %   Monocytes Relative 5  3 - 12 %   Eosinophils Relative 1  0 - 5 %   Basophils Relative 0  0 - 1 %   Neutro Abs 14.5 (*) 1.7 - 7.7 K/uL   Lymphs Abs 6.2 (*) 0.7 - 4.0 K/uL   Monocytes Absolute  1.1 (*) 0.1 - 1.0 K/uL   Eosinophils Absolute 0.2  0.0 - 0.7 K/uL   Basophils Absolute 0.0  0.0 - 0.1 K/uL   WBC Morphology ATYPICAL LYMPHOCYTES     Smear Review LARGE PLATELETS PRESENT    BASIC METABOLIC PANEL      Result Value Range   Sodium 138  135 - 145 mEq/L   Potassium 4.0  3.5 - 5.1 mEq/L   Chloride 102  96 - 112 mEq/L   CO2 24  19 - 32 mEq/L   Glucose, Bld 175 (*) 70 - 99 mg/dL   BUN 28 (*) 6 - 23 mg/dL   Creatinine, Ser 0.86  0.50 -  1.10 mg/dL   Calcium 47.8  8.4 - 29.5 mg/dL   GFR calc non Af Amer 77 (*) >90 mL/min   GFR calc Af Amer 89 (*) >90 mL/min  URINALYSIS, ROUTINE W REFLEX MICROSCOPIC      Result Value Range   Color, Urine YELLOW  YELLOW   APPearance CLOUDY (*) CLEAR   Specific Gravity, Urine 1.015  1.005 - 1.030   pH 8.5 (*) 5.0 - 8.0   Glucose, UA NEGATIVE  NEGATIVE mg/dL   Hgb urine dipstick NEGATIVE  NEGATIVE   Bilirubin Urine NEGATIVE  NEGATIVE   Ketones, ur NEGATIVE  NEGATIVE mg/dL   Protein, ur 30 (*) NEGATIVE mg/dL   Urobilinogen, UA 0.2  0.0 - 1.0 mg/dL   Nitrite NEGATIVE  NEGATIVE   Leukocytes, UA MODERATE (*) NEGATIVE  URINE MICROSCOPIC-ADD ON      Result Value Range   WBC, UA 7-10  <3 WBC/hpf   Bacteria, UA MANY (*) RARE   Crystals TRIPLE PHOSPHATE CRYSTALS (*) NEGATIVE    1. Urinary tract infection       MDM  Patient sent because of a positive for urine culture. That was obtained yesterday and the urinalysis was actually completely normal. The culture grew an organism that was sensitive only to vancomycin. Patient has no clinical signs of urinary tract infection and I do not believe that she should have a PICC line inserted to treat this culture. Urine will be repeated by in and out catheterization and repeat culture done. However, if hearing continues to come back positive for an organism sensitive only to vancomycin, I think the treatment will need to be withheld until she clinically has an infection. Case is discussed with her PCP, Dr.  [name], who agrees with this management strategy. Old records are reviewed and she was in the hospital last month at which time she had a urinary tract infection with Proteus which was sensitive to cephalosporins and treated appropriately.  Urinalysis has come back consistent with UTI with 7-10 wbc's and many bacteria. However, given confusion relating to the previous urinalysis and culture, will wait on culture results to decide whether to start antibiotics and which antibiotic.  I personally performed the services described in this documentation, which was scribed in my presence. The recorded information has been reviewed and is accurate.    Dione Booze, MD 08/03/12 [phone]

## 2012-08-03 NOTE — ED Notes (Signed)
Pt calm at present, dozing to sleep at times.

## 2012-08-03 NOTE — ED Notes (Signed)
Transportation arrived for transport back to [facility], pt cont. To be combative at times, her son has arrived and given update on pt. He took meal tray w/ him that the pt refused.

## 2012-08-03 NOTE — ED Notes (Signed)
Pt sitting on side of bed, repositioned in bed for safety. Pt stated she doesn't understand why she is here. Attempted to explain to pt. She stated "just get on"

## 2012-08-03 NOTE — ED Notes (Signed)
Pt arrived from [facility], she has a known uti, the culture came back today from the lab and she is on sensitive to vancomycin, sent by nursing home because they are unable to do iv antibiotics.

## 2012-08-03 NOTE — ED Notes (Signed)
Report called to [facility]. Eileen Stanford,  Will send transportation from [facility] in the next 30 min.

## 2012-08-04 ENCOUNTER — Encounter (HOSPITAL_COMMUNITY): Payer: Self-pay

## 2012-08-04 ENCOUNTER — Emergency Department (HOSPITAL_COMMUNITY)
Admission: EM | Admit: 2012-08-04 | Discharge: 2012-08-05 | Disposition: A | Discharge status: Skilled Nursing Facility | Payer: Medicare Other | Attending: Emergency Medicine | Admitting: Emergency Medicine

## 2012-08-04 DIAGNOSIS — K219 Gastro-esophageal reflux disease without esophagitis: Secondary | ICD-10-CM | POA: Insufficient documentation

## 2012-08-04 DIAGNOSIS — Z794 Long term (current) use of insulin: Secondary | ICD-10-CM | POA: Insufficient documentation

## 2012-08-04 DIAGNOSIS — Z862 Personal history of diseases of the blood and blood-forming organs and certain disorders involving the immune mechanism: Secondary | ICD-10-CM | POA: Insufficient documentation

## 2012-08-04 DIAGNOSIS — E109 Type 1 diabetes mellitus without complications: Secondary | ICD-10-CM | POA: Insufficient documentation

## 2012-08-04 DIAGNOSIS — W1809XA Striking against other object with subsequent fall, initial encounter: Secondary | ICD-10-CM | POA: Insufficient documentation

## 2012-08-04 DIAGNOSIS — Z856 Personal history of leukemia: Secondary | ICD-10-CM | POA: Insufficient documentation

## 2012-08-04 DIAGNOSIS — F0390 Unspecified dementia without behavioral disturbance: Secondary | ICD-10-CM | POA: Insufficient documentation

## 2012-08-04 DIAGNOSIS — Z8673 Personal history of transient ischemic attack (TIA), and cerebral infarction without residual deficits: Secondary | ICD-10-CM | POA: Insufficient documentation

## 2012-08-04 DIAGNOSIS — I10 Essential (primary) hypertension: Secondary | ICD-10-CM | POA: Insufficient documentation

## 2012-08-04 DIAGNOSIS — I252 Old myocardial infarction: Secondary | ICD-10-CM | POA: Insufficient documentation

## 2012-08-04 DIAGNOSIS — Y9210 Unspecified residential institution as the place of occurrence of the external cause: Secondary | ICD-10-CM | POA: Insufficient documentation

## 2012-08-04 DIAGNOSIS — F329 Major depressive disorder, single episode, unspecified: Secondary | ICD-10-CM | POA: Insufficient documentation

## 2012-08-04 DIAGNOSIS — Z8719 Personal history of other diseases of the digestive system: Secondary | ICD-10-CM | POA: Insufficient documentation

## 2012-08-04 DIAGNOSIS — Z9861 Coronary angioplasty status: Secondary | ICD-10-CM | POA: Insufficient documentation

## 2012-08-04 DIAGNOSIS — I2510 Atherosclerotic heart disease of native coronary artery without angina pectoris: Secondary | ICD-10-CM | POA: Insufficient documentation

## 2012-08-04 DIAGNOSIS — Z8639 Personal history of other endocrine, nutritional and metabolic disease: Secondary | ICD-10-CM | POA: Insufficient documentation

## 2012-08-04 DIAGNOSIS — M109 Gout, unspecified: Secondary | ICD-10-CM | POA: Insufficient documentation

## 2012-08-04 DIAGNOSIS — Z8669 Personal history of other diseases of the nervous system and sense organs: Secondary | ICD-10-CM | POA: Insufficient documentation

## 2012-08-04 DIAGNOSIS — Z79899 Other long term (current) drug therapy: Secondary | ICD-10-CM | POA: Insufficient documentation

## 2012-08-04 DIAGNOSIS — Z8679 Personal history of other diseases of the circulatory system: Secondary | ICD-10-CM | POA: Insufficient documentation

## 2012-08-04 DIAGNOSIS — Y939 Activity, unspecified: Secondary | ICD-10-CM | POA: Insufficient documentation

## 2012-08-04 DIAGNOSIS — S0990XA Unspecified injury of head, initial encounter: Secondary | ICD-10-CM | POA: Insufficient documentation

## 2012-08-04 DIAGNOSIS — F3289 Other specified depressive episodes: Secondary | ICD-10-CM | POA: Insufficient documentation

## 2012-08-04 MED ORDER — LORAZEPAM 1 MG PO TABS
1.0000 mg | ORAL_TABLET | Freq: Once | ORAL | Status: AC
  Administered 2012-08-04: 1 mg via ORAL
  Filled 2012-08-04: qty 1

## 2012-08-04 MED ORDER — QUETIAPINE FUMARATE 25 MG PO TABS
ORAL_TABLET | ORAL | Status: AC
  Filled 2012-08-04: qty 1

## 2012-08-04 MED ORDER — QUETIAPINE FUMARATE 25 MG PO TABS
25.0000 mg | ORAL_TABLET | Freq: Once | ORAL | Status: AC
  Administered 2012-08-04: 25 mg via ORAL
  Filled 2012-08-04: qty 1

## 2012-08-04 NOTE — ED Notes (Signed)
Patient seen in ED yesterday for same complaints. Diagnosed with UTI.

## 2012-08-04 NOTE — ED Notes (Signed)
Pt is normally agitated and can be aggressive at times, reportedly the [facility] staff reports they are unable to handle her tonight.   Pt arrives calm and cooperative.

## 2012-08-04 NOTE — ED Notes (Signed)
Patient up on bedside commode, tolerated well 
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
## 2012-08-05 ENCOUNTER — Other Ambulatory Visit: Payer: Self-pay | Admitting: Internal Medicine

## 2012-08-05 LAB — URINE CULTURE

## 2012-08-06 ENCOUNTER — Telehealth (HOSPITAL_COMMUNITY): Payer: Self-pay | Admitting: Emergency Medicine

## 2012-08-06 NOTE — ED Notes (Signed)
Patient has +Urine culture. °

## 2012-08-06 NOTE — ED Notes (Signed)
+   Urine Chart sent to EDP office for review. 

## 2012-08-08 ENCOUNTER — Encounter (HOSPITAL_COMMUNITY): Payer: Self-pay | Admitting: Emergency Medicine

## 2012-08-08 ENCOUNTER — Emergency Department (HOSPITAL_COMMUNITY)
Admission: EM | Admit: 2012-08-08 | Discharge: 2012-08-08 | Disposition: A | Discharge status: Home / Self Care | Payer: Medicare Other | Attending: Emergency Medicine | Admitting: Emergency Medicine

## 2012-08-08 ENCOUNTER — Emergency Department (HOSPITAL_COMMUNITY): Payer: Medicare Other

## 2012-08-08 DIAGNOSIS — F3289 Other specified depressive episodes: Secondary | ICD-10-CM | POA: Insufficient documentation

## 2012-08-08 DIAGNOSIS — Z862 Personal history of diseases of the blood and blood-forming organs and certain disorders involving the immune mechanism: Secondary | ICD-10-CM | POA: Insufficient documentation

## 2012-08-08 DIAGNOSIS — IMO0002 Reserved for concepts with insufficient information to code with codable children: Secondary | ICD-10-CM | POA: Insufficient documentation

## 2012-08-08 DIAGNOSIS — M109 Gout, unspecified: Secondary | ICD-10-CM | POA: Insufficient documentation

## 2012-08-08 DIAGNOSIS — K219 Gastro-esophageal reflux disease without esophagitis: Secondary | ICD-10-CM | POA: Insufficient documentation

## 2012-08-08 DIAGNOSIS — Z8669 Personal history of other diseases of the nervous system and sense organs: Secondary | ICD-10-CM | POA: Insufficient documentation

## 2012-08-08 DIAGNOSIS — Y939 Activity, unspecified: Secondary | ICD-10-CM | POA: Insufficient documentation

## 2012-08-08 DIAGNOSIS — Z8673 Personal history of transient ischemic attack (TIA), and cerebral infarction without residual deficits: Secondary | ICD-10-CM | POA: Insufficient documentation

## 2012-08-08 DIAGNOSIS — Z9849 Cataract extraction status, unspecified eye: Secondary | ICD-10-CM | POA: Insufficient documentation

## 2012-08-08 DIAGNOSIS — E109 Type 1 diabetes mellitus without complications: Secondary | ICD-10-CM | POA: Insufficient documentation

## 2012-08-08 DIAGNOSIS — I2510 Atherosclerotic heart disease of native coronary artery without angina pectoris: Secondary | ICD-10-CM | POA: Insufficient documentation

## 2012-08-08 DIAGNOSIS — Z79899 Other long term (current) drug therapy: Secondary | ICD-10-CM | POA: Insufficient documentation

## 2012-08-08 DIAGNOSIS — F0390 Unspecified dementia without behavioral disturbance: Secondary | ICD-10-CM | POA: Insufficient documentation

## 2012-08-08 DIAGNOSIS — Z951 Presence of aortocoronary bypass graft: Secondary | ICD-10-CM | POA: Insufficient documentation

## 2012-08-08 DIAGNOSIS — F329 Major depressive disorder, single episode, unspecified: Secondary | ICD-10-CM | POA: Insufficient documentation

## 2012-08-08 DIAGNOSIS — S0990XA Unspecified injury of head, initial encounter: Secondary | ICD-10-CM | POA: Insufficient documentation

## 2012-08-08 DIAGNOSIS — Z794 Long term (current) use of insulin: Secondary | ICD-10-CM | POA: Insufficient documentation

## 2012-08-08 DIAGNOSIS — Y9210 Unspecified residential institution as the place of occurrence of the external cause: Secondary | ICD-10-CM | POA: Insufficient documentation

## 2012-08-08 DIAGNOSIS — C9110 Chronic lymphocytic leukemia of B-cell type not having achieved remission: Secondary | ICD-10-CM | POA: Insufficient documentation

## 2012-08-08 DIAGNOSIS — Z8679 Personal history of other diseases of the circulatory system: Secondary | ICD-10-CM | POA: Insufficient documentation

## 2012-08-08 DIAGNOSIS — W19XXXA Unspecified fall, initial encounter: Secondary | ICD-10-CM

## 2012-08-08 DIAGNOSIS — Z9861 Coronary angioplasty status: Secondary | ICD-10-CM | POA: Insufficient documentation

## 2012-08-08 DIAGNOSIS — Z8639 Personal history of other endocrine, nutritional and metabolic disease: Secondary | ICD-10-CM | POA: Insufficient documentation

## 2012-08-08 DIAGNOSIS — Z961 Presence of intraocular lens: Secondary | ICD-10-CM | POA: Insufficient documentation

## 2012-08-08 DIAGNOSIS — Z8719 Personal history of other diseases of the digestive system: Secondary | ICD-10-CM | POA: Insufficient documentation

## 2012-08-08 DIAGNOSIS — Z859 Personal history of malignant neoplasm, unspecified: Secondary | ICD-10-CM | POA: Insufficient documentation

## 2012-08-08 DIAGNOSIS — Z7901 Long term (current) use of anticoagulants: Secondary | ICD-10-CM | POA: Insufficient documentation

## 2012-08-08 DIAGNOSIS — I10 Essential (primary) hypertension: Secondary | ICD-10-CM | POA: Insufficient documentation

## 2012-08-08 DIAGNOSIS — I4891 Unspecified atrial fibrillation: Secondary | ICD-10-CM | POA: Insufficient documentation

## 2012-08-08 DIAGNOSIS — I252 Old myocardial infarction: Secondary | ICD-10-CM | POA: Insufficient documentation

## 2012-08-08 NOTE — ED Notes (Signed)
Patient is At [facility].

## 2012-08-08 NOTE — ED Provider Notes (Signed)
History     CSN: [id number]  Arrival date & time 08/08/12  1106   First MD Initiated Contact with Patient 08/08/12 1255      Chief Complaint  Patient presents with  . Fall    (Consider location/radiation/quality/duration/timing/severity/associated sxs/prior treatment) Patient is a 79 y.o. female presenting with fall. The history is provided by the nursing home. The history is limited by the condition of the patient (Dementia).  Fall  She is reported to have fallen at the nursing home where she resides was observed to have her head. There was no known loss of consciousness. Patient has dementia and has no memory of the incident and does not know why she is here. She has not complaining of anything. Of note, she is anticoagulated on Rivaroxaban.  Past Medical History  Diagnosis Date  . HTN (hypertension)   . Coronary atherosclerosis of unspecified type of vessel, native or graft     a. remote CABG in 1999 with Dr. Laneta Simmers. b. s/p PCI with stent to LCX in 2009 (Platinum protocol study). c. NSTEMI 10/2011 s/p BMS to SVG->diagonal.  . Personal history of other diseases of digestive system   . Gout, unspecified     "have had it in my hands and my feet"  . Unspecified hereditary and idiopathic peripheral neuropathy   . Esophageal reflux   . Anemia   . GI bleed   . Leukocytosis 03/16/2011  . Hyperlipidemia     Statin intolerant  . Pancreas disorder     prior pancreatectomy in 2011 complicated by leak requiring prolong and repeated drainage and subsequent pancreatic stent  . Other chronic pulmonary heart diseases   . Type I (juvenile type) diabetes mellitus without mention of complication, not stated as uncontrolled     Previously on insulin pump  . Permanent atrial fibrillation   . Anginal pain 11/24/11    "1st time"  . Exertional dyspnea 11/24/11  . Depression   . 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 CLL (chronic lymphocytic leukemia)     seeing Dr.  [name]  . Myocardial infarction 1999    "on the verge of one"  . NSTEMI (non-ST elevated myocardial infarction) 10/2011    /H&P  . Stroke   . Atrial fibrillation   . Depression   . Acid reflux   . Dementia     Past Surgical History  Procedure Laterality Date  . Oophorectomy  1970's    w hysterectomy and bladder tacking procedure  . Belpharoptosis repair      right upper eyelid  . Esophagogastroduodenoscopy  07/11/2010  . Colonoscopy  10/20/06  . Tonsillectomy  1947  . Appendectomy  1970's?  . Abdominal hysterectomy  1970's  . Coronary angioplasty with stent placement  10/2011    "makes total of 4"; 20-30% ostial left main stenosis; occluded ostial LAD supplied by IMA graft, 20-30% scattered LCx stenoses with widely patent stent and 20% narrowing; diffuse RCA luminal irregularities up to 20-30%; SVG-diagonal patent, but proximal 90% stenosis s/p BMS; patent LIMA-LAD; LVEF 55-65%.  . Coronary angioplasty with stent placement  2009    LCX  . Cataract extraction w/ intraocular lens  implant, bilateral  ~ 2003  . Coronary artery bypass graft  1999    LIMA to LAD, SVG to the DX  . Pancreatectomy  2011    c/b leak requiring multiple drainage attempts and stent placement), /H&P  . Cardiac surgery    . Colonoscopy  01/20/2012  Procedure: COLONOSCOPY;  Surgeon: Malissa Hippo, MD;  Location: [facility];  Service: Endoscopy;  Laterality: N/A;    Family History  Problem Relation Age of Onset  . Stroke Mother   . Lung cancer Father   . Healthy Son     History  Substance Use Topics  . Smoking status: Never Smoker   . Smokeless tobacco: Never Used  . Alcohol Use: No    OB History   Grav Para Term Preterm Abortions TAB SAB Ect Mult Living                  Review of Systems  Unable to perform ROS: Dementia    Allergies  Codeine; Ivp dye; Morphine; Prednisone; and Statins  Home Medications   Current Outpatient Rx  Name  Route  Sig  Dispense  Refill  . colchicine  0.6 MG tablet   Oral   Take 0.6 mg by mouth daily as needed. Gout Flare         . docusate sodium (COLACE) 100 MG capsule   Oral   Take 100 mg by mouth daily.          Marland Kitchen ezetimibe (ZETIA) 10 MG tablet   Oral   Take 10 mg by mouth daily.         . feeding supplement (PRO-STAT SUGAR FREE 64) LIQD   Oral   Take 30 mLs by mouth daily.         . insulin aspart (NOVOLOG) 100 UNIT/ML injection   Subcutaneous   Inject 10 Units into the skin 2 (two) times daily. Before lunch and dinner if eating         . insulin glargine (LANTUS) 100 UNIT/ML injection   Subcutaneous   Inject 32 Units into the skin at bedtime.         Marland Kitchen LORazepam (ATIVAN) 0.5 MG tablet   Oral   Take 0.5 mg by mouth 2 (two) times daily as needed for anxiety (agitation).          . magnesium oxide (MAG-OX) 400 MG tablet   Oral   Take 400 mg by mouth 2 (two) times daily.         . megestrol (MEGACE) 40 MG/ML suspension   Oral   Take 400 mg by mouth every morning.         . metoprolol tartrate (LOPRESSOR) 25 MG tablet   Oral   Take 12.5 mg by mouth 2 (two) times daily.          . Multiple Vitamin (DAILY VITE PO)   Oral   Take 1 tablet by mouth daily.         . nitrofurantoin (MACRODANTIN) 100 MG capsule   Oral   Take 100 mg by mouth 2 (two) times daily. Starting 08/03/2012 for 7 days.         . nitroGLYCERIN (NITROSTAT) 0.4 MG SL tablet   Sublingual   Place 1 tablet (0.4 mg total) under the tongue every 5 (five) minutes x 3 doses as needed for chest pain.   25 tablet   4   . olmesartan (BENICAR) 40 MG tablet   Oral   Take 40 mg by mouth daily.         Marland Kitchen omeprazole (PRILOSEC) 20 MG capsule   Oral   Take 20 mg by mouth daily.         . ondansetron (ZOFRAN) 4 MG tablet   Oral   Take 1  tablet (4 mg total) by mouth every 8 (eight) hours as needed for nausea.   30 tablet   1   . polyethylene glycol (MIRALAX / GLYCOLAX) packet   Oral   Take 17 g by mouth daily.          . QUEtiapine (SEROQUEL) 25 MG tablet   Oral   Take 25 mg by mouth at bedtime.         . Rivaroxaban (XARELTO) 20 MG TABS   Oral   Take 20 mg by mouth daily.         . vitamin C (ASCORBIC ACID) 500 MG tablet   Oral   Take 500 mg by mouth 2 (two) times daily.         . Vitamin D, Ergocalciferol, (DRISDOL) 50000 UNITS CAPS   Oral   Take 50,000 Units by mouth every 7 (seven) days. On Thursday           BP 145/69  Pulse 80  Temp(Src) 97.5 F (36.4 C) (Oral)  Resp 18  SpO2 97%  Physical Exam  Nursing note and vitals reviewed.  79 year old female, resting comfortably and in no acute distress. Vital signs are significant for hypertension with blood pressure 145/69. Oxygen saturation is 97%, which is normal. Head is normocephalic and atraumatic. PERRLA, EOMI. Oropharynx is clear. Neck is nontender and supple without adenopathy or JVD. Back is nontender and there is no CVA tenderness. Lungs are clear without rales, wheezes, or rhonchi. Chest is nontender. Heart has regular rate and rhythm without murmur. Abdomen is soft, flat, nontender without masses or hepatosplenomegaly and peristalsis is normoactive. Extremities have no cyanosis or edema, full range of motion is present. Skin is warm and dry without rash. Neurologic: She is awake and alert, oriented to person but not place or time, cranial nerves are intact, there are no motor or sensory deficits.  ED Course  Procedures (including critical care time)  Labs Reviewed - No data to display Ct Head Wo Contrast  08/08/2012  *RADIOLOGY REPORT*  Clinical Data:  Fall and right sided head pain.  CT HEAD WITHOUT CONTRAST CT CERVICAL SPINE WITHOUT CONTRAST  Technique:  Multidetector CT imaging of the head and cervical spine was performed following the standard protocol without intravenous contrast.  Multiplanar CT image reconstructions of the cervical spine were also generated.  Comparison:  05/12/2012  CT HEAD  Findings: There  is stable encephalomalacia in the right frontal lobe as well as the inferior medial right cerebellum.  No evidence for acute hemorrhage, mass lesion, midline shift or hydrocephalus. No evidence for a new large infarct.  There is some mucosal disease or fluid in the left maxillary sinus.  No acute bony abnormality.  IMPRESSION: No acute intracranial abnormality.  Old infarcts.  CT CERVICAL SPINE  Findings: Scarring at the left lung apex.  No evidence for a pneumothorax.  No acute fracture or dislocation.  No significant soft tissue swelling.  Multilevel degenerative facet disease. Normal alignment of the cervical spine.  Normal alignment at the cervicothoracic junction.  IMPRESSION: No acute bony abnormality to the cervical spine.  Cervical spondylosis.   Original Report Authenticated By: Richarda Overlie, M.D.    Ct Cervical Spine Wo Contrast  08/08/2012  *RADIOLOGY REPORT*  Clinical Data:  Fall and right sided head pain.  CT HEAD WITHOUT CONTRAST CT CERVICAL SPINE WITHOUT CONTRAST  Technique:  Multidetector CT imaging of the head and cervical spine was performed following the standard protocol without intravenous  contrast.  Multiplanar CT image reconstructions of the cervical spine were also generated.  Comparison:  05/12/2012  CT HEAD  Findings: There is stable encephalomalacia in the right frontal lobe as well as the inferior medial right cerebellum.  No evidence for acute hemorrhage, mass lesion, midline shift or hydrocephalus. No evidence for a new large infarct.  There is some mucosal disease or fluid in the left maxillary sinus.  No acute bony abnormality.  IMPRESSION: No acute intracranial abnormality.  Old infarcts.  CT CERVICAL SPINE  Findings: Scarring at the left lung apex.  No evidence for a pneumothorax.  No acute fracture or dislocation.  No significant soft tissue swelling.  Multilevel degenerative facet disease. Normal alignment of the cervical spine.  Normal alignment at the cervicothoracic junction.   IMPRESSION: No acute bony abnormality to the cervical spine.  Cervical spondylosis.   Original Report Authenticated By: Richarda Overlie, M.D.      1. Fall at nursing home, initial encounter       MDM  Fall with no apparent injury. However, given her dementia and the fact that she is on anticoagulants, and CT of head and cervical spine will be obtained. Old records are reviewed and she has multiple ED visits for falls.  CT is unremarkable. She is discharged to return to the nursing home. Given multiple falls, consideration should be given to the risk versus benefit of maintaining her on anticoagulants.        Dione Booze, MD 08/08/12 1324

## 2012-08-08 NOTE — ED Notes (Signed)
Pt comes from [place] after witnessed fall this morning. Staff reports pt tripped over coffee table and hit head on ground. Pt c/o pain on top of her head but denies pain anywhere else. Pt also denies LOC. Pt was able to answer questions during assessment.

## 2012-08-08 NOTE — ED Notes (Addendum)
Pt c/o pain to right side of head after bumping into coffee table and falling onto right side. Denies any other pain. Pt alert and oriented to person/place. Pt recently dx with uti.

## 2012-08-14 ENCOUNTER — Telehealth (HOSPITAL_COMMUNITY): Payer: Self-pay | Admitting: Emergency Medicine

## 2012-08-14 NOTE — ED Notes (Signed)
Chart returned from EDP office. Per Ebbie Ridge PA-C, Keflex 500 mg. #40. 1 PO QID. No refills.

## 2012-08-19 ENCOUNTER — Emergency Department (HOSPITAL_COMMUNITY)
Admission: EM | Admit: 2012-08-19 | Discharge: 2012-08-19 | Disposition: A | Discharge status: Home / Self Care | Payer: Medicare Other | Attending: Emergency Medicine | Admitting: Emergency Medicine

## 2012-08-19 ENCOUNTER — Emergency Department (HOSPITAL_COMMUNITY): Payer: Medicare Other

## 2012-08-19 ENCOUNTER — Other Ambulatory Visit: Payer: Self-pay

## 2012-08-19 ENCOUNTER — Encounter (HOSPITAL_COMMUNITY): Payer: Self-pay | Admitting: Emergency Medicine

## 2012-08-19 DIAGNOSIS — F3289 Other specified depressive episodes: Secondary | ICD-10-CM | POA: Insufficient documentation

## 2012-08-19 DIAGNOSIS — Z794 Long term (current) use of insulin: Secondary | ICD-10-CM | POA: Insufficient documentation

## 2012-08-19 DIAGNOSIS — F329 Major depressive disorder, single episode, unspecified: Secondary | ICD-10-CM | POA: Insufficient documentation

## 2012-08-19 DIAGNOSIS — Z8669 Personal history of other diseases of the nervous system and sense organs: Secondary | ICD-10-CM | POA: Insufficient documentation

## 2012-08-19 DIAGNOSIS — Z8739 Personal history of other diseases of the musculoskeletal system and connective tissue: Secondary | ICD-10-CM | POA: Insufficient documentation

## 2012-08-19 DIAGNOSIS — I2510 Atherosclerotic heart disease of native coronary artery without angina pectoris: Secondary | ICD-10-CM | POA: Insufficient documentation

## 2012-08-19 DIAGNOSIS — Z8673 Personal history of transient ischemic attack (TIA), and cerebral infarction without residual deficits: Secondary | ICD-10-CM | POA: Insufficient documentation

## 2012-08-19 DIAGNOSIS — Y9389 Activity, other specified: Secondary | ICD-10-CM | POA: Insufficient documentation

## 2012-08-19 DIAGNOSIS — Z8679 Personal history of other diseases of the circulatory system: Secondary | ICD-10-CM | POA: Insufficient documentation

## 2012-08-19 DIAGNOSIS — F0390 Unspecified dementia without behavioral disturbance: Secondary | ICD-10-CM | POA: Insufficient documentation

## 2012-08-19 DIAGNOSIS — I4891 Unspecified atrial fibrillation: Secondary | ICD-10-CM | POA: Insufficient documentation

## 2012-08-19 DIAGNOSIS — I10 Essential (primary) hypertension: Secondary | ICD-10-CM | POA: Insufficient documentation

## 2012-08-19 DIAGNOSIS — Z8639 Personal history of other endocrine, nutritional and metabolic disease: Secondary | ICD-10-CM | POA: Insufficient documentation

## 2012-08-19 DIAGNOSIS — E109 Type 1 diabetes mellitus without complications: Secondary | ICD-10-CM | POA: Insufficient documentation

## 2012-08-19 DIAGNOSIS — Z8719 Personal history of other diseases of the digestive system: Secondary | ICD-10-CM | POA: Insufficient documentation

## 2012-08-19 DIAGNOSIS — W19XXXA Unspecified fall, initial encounter: Secondary | ICD-10-CM | POA: Insufficient documentation

## 2012-08-19 DIAGNOSIS — I252 Old myocardial infarction: Secondary | ICD-10-CM | POA: Insufficient documentation

## 2012-08-19 DIAGNOSIS — Z79899 Other long term (current) drug therapy: Secondary | ICD-10-CM | POA: Insufficient documentation

## 2012-08-19 DIAGNOSIS — Z862 Personal history of diseases of the blood and blood-forming organs and certain disorders involving the immune mechanism: Secondary | ICD-10-CM | POA: Insufficient documentation

## 2012-08-19 DIAGNOSIS — Z9861 Coronary angioplasty status: Secondary | ICD-10-CM | POA: Insufficient documentation

## 2012-08-19 DIAGNOSIS — Y929 Unspecified place or not applicable: Secondary | ICD-10-CM | POA: Insufficient documentation

## 2012-08-19 DIAGNOSIS — R040 Epistaxis: Secondary | ICD-10-CM | POA: Insufficient documentation

## 2012-08-19 DIAGNOSIS — K219 Gastro-esophageal reflux disease without esophagitis: Secondary | ICD-10-CM | POA: Insufficient documentation

## 2012-08-19 LAB — URINALYSIS, ROUTINE W REFLEX MICROSCOPIC
Bilirubin Urine: NEGATIVE
Glucose, UA: NEGATIVE mg/dL
Hgb urine dipstick: NEGATIVE
Ketones, ur: NEGATIVE mg/dL
Leukocytes, UA: NEGATIVE
Nitrite: NEGATIVE
Protein, ur: NEGATIVE mg/dL
Specific Gravity, Urine: 1.01 (ref 1.005–1.030)
Urobilinogen, UA: 0.2 mg/dL (ref 0.0–1.0)
pH: 7 (ref 5.0–8.0)

## 2012-08-19 LAB — POCT I-STAT, CHEM 8
BUN: 25 mg/dL — ABNORMAL HIGH (ref 6–23)
Calcium, Ion: 1.28 mmol/L (ref 1.13–1.30)
Chloride: 110 mEq/L (ref 96–112)
Creatinine, Ser: 0.8 mg/dL (ref 0.50–1.10)
Glucose, Bld: 122 mg/dL — ABNORMAL HIGH (ref 70–99)
HCT: 40 % (ref 36.0–46.0)
Hemoglobin: 13.6 g/dL (ref 12.0–15.0)
Potassium: 3.8 mEq/L (ref 3.5–5.1)
Sodium: 142 mEq/L (ref 135–145)
TCO2: 23 mmol/L (ref 0–100)

## 2012-08-19 NOTE — ED Notes (Addendum)
Pt sent from [facility] for nosebleed. Bleeding controlled upon arrival to ED. Questionable fall this am.  Pt reports she thinks she fell this am but can not remember why or what part of her body hit the ground. Pt denies any pain. Nad noted.

## 2012-08-24 NOTE — ED Provider Notes (Signed)
History    82yF presenting after possible fall. Pt seems to think she did but cannot give clear history. Denies any pain. No n/v. Recent evaluation for same and recent tx for uti. No report of altered mental status.   CSN: [id number]  Arrival date & time 08/19/12  1451   First MD Initiated Contact with Patient 08/19/12 1502      Chief Complaint  Patient presents with  . Epistaxis    (Consider location/radiation/quality/duration/timing/severity/associated sxs/prior treatment) HPI  Past Medical History  Diagnosis Date  . HTN (hypertension)   . Coronary atherosclerosis of unspecified type of vessel, native or graft     a. remote CABG in 1999 with Dr. Laneta Simmers. b. s/p PCI with stent to LCX in 2009 (Platinum protocol study). c. NSTEMI 10/2011 s/p BMS to SVG->diagonal.  . Personal history of other diseases of digestive system   . Gout, unspecified     "have had it in my hands and my feet"  . Unspecified hereditary and idiopathic peripheral neuropathy   . Esophageal reflux   . Anemia   . GI bleed   . Leukocytosis 03/16/2011  . Hyperlipidemia     Statin intolerant  . Pancreas disorder     prior pancreatectomy in 2011 complicated by leak requiring prolong and repeated drainage and subsequent pancreatic stent  . Other chronic pulmonary heart diseases   . Type I (juvenile type) diabetes mellitus without mention of complication, not stated as uncontrolled     Previously on insulin pump  . Permanent atrial fibrillation   . Anginal pain 11/24/11    "1st time"  . Exertional dyspnea 11/24/11  . Depression   . 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 CLL (chronic lymphocytic leukemia)     seeing Dr. [name]  . Myocardial infarction 1999    "on the verge of one"  . NSTEMI (non-ST elevated myocardial infarction) 10/2011    /H&P  . Stroke   . Atrial fibrillation   . Depression   . Acid reflux   . Dementia     Past Surgical History  Procedure Laterality Date  .  Oophorectomy  1970's    w hysterectomy and bladder tacking procedure  . Belpharoptosis repair      right upper eyelid  . Esophagogastroduodenoscopy  07/11/2010  . Colonoscopy  10/20/06  . Tonsillectomy  1947  . Appendectomy  1970's?  . Abdominal hysterectomy  1970's  . Coronary angioplasty with stent placement  10/2011    "makes total of 4"; 20-30% ostial left main stenosis; occluded ostial LAD supplied by IMA graft, 20-30% scattered LCx stenoses with widely patent stent and 20% narrowing; diffuse RCA luminal irregularities up to 20-30%; SVG-diagonal patent, but proximal 90% stenosis s/p BMS; patent LIMA-LAD; LVEF 55-65%.  . Coronary angioplasty with stent placement  2009    LCX  . Cataract extraction w/ intraocular lens  implant, bilateral  ~ 2003  . Coronary artery bypass graft  1999    LIMA to LAD, SVG to the DX  . Pancreatectomy  2011    c/b leak requiring multiple drainage attempts and stent placement), /H&P  . Cardiac surgery    . Colonoscopy  01/20/2012    Procedure: COLONOSCOPY;  Surgeon: Malissa Hippo, MD;  Location: [facility];  Service: Endoscopy;  Laterality: N/A;    Family History  Problem Relation Age of Onset  . Stroke Mother   . Lung cancer Father   . Healthy Son     History  Substance Use Topics  . Smoking status: Never Smoker   . Smokeless tobacco: Never Used  . Alcohol Use: No    OB History   Grav Para Term Preterm Abortions TAB SAB Ect Mult Living                  Review of Systems  Level 5 caveat because pt has dementia.    Allergies  Codeine; Ivp dye; Morphine; Prednisone; and Statins  Home Medications   Current Outpatient Rx  Name  Route  Sig  Dispense  Refill  . docusate sodium (COLACE) 100 MG capsule   Oral   Take 100 mg by mouth daily.          Marland Kitchen ezetimibe (ZETIA) 10 MG tablet   Oral   Take 10 mg by mouth daily.         . feeding supplement (PRO-STAT SUGAR FREE 64) LIQD   Oral   Take 30 mLs by mouth daily.         .  insulin aspart (NOVOLOG) 100 UNIT/ML injection   Subcutaneous   Inject 10 Units into the skin 2 (two) times daily. Before lunch and dinner if eating         . insulin glargine (LANTUS) 100 UNIT/ML injection   Subcutaneous   Inject 32 Units into the skin at bedtime.         Marland Kitchen LORazepam (ATIVAN) 0.5 MG tablet   Oral   Take 0.5 mg by mouth 2 (two) times daily as needed for anxiety (agitation).          . magnesium oxide (MAG-OX) 400 MG tablet   Oral   Take 400 mg by mouth 2 (two) times daily.         . megestrol (MEGACE) 40 MG/ML suspension   Oral   Take 400 mg by mouth every morning.         . metoprolol tartrate (LOPRESSOR) 25 MG tablet   Oral   Take 12.5 mg by mouth 2 (two) times daily.          . Multiple Vitamin (DAILY VITE PO)   Oral   Take 1 tablet by mouth daily.         Marland Kitchen olmesartan (BENICAR) 40 MG tablet   Oral   Take 40 mg by mouth daily.         Marland Kitchen omeprazole (PRILOSEC) 20 MG capsule   Oral   Take 20 mg by mouth daily.         . polyethylene glycol (MIRALAX / GLYCOLAX) packet   Oral   Take 17 g by mouth daily.         . QUEtiapine (SEROQUEL) 25 MG tablet   Oral   Take 25 mg by mouth at bedtime.         . Rivaroxaban (XARELTO) 20 MG TABS   Oral   Take 20 mg by mouth daily.         . vitamin C (ASCORBIC ACID) 500 MG tablet   Oral   Take 500 mg by mouth 2 (two) times daily.         . Vitamin D, Ergocalciferol, (DRISDOL) 50000 UNITS CAPS   Oral   Take 50,000 Units by mouth every 7 (seven) days. On Thursday         . colchicine 0.6 MG tablet   Oral   Take 0.6 mg by mouth daily as needed. Gout Flare         .  nitroGLYCERIN (NITROSTAT) 0.4 MG SL tablet   Sublingual   Place 1 tablet (0.4 mg total) under the tongue every 5 (five) minutes x 3 doses as needed for chest pain.   25 tablet   4   . ondansetron (ZOFRAN) 4 MG tablet   Oral   Take 1 tablet (4 mg total) by mouth every 8 (eight) hours as needed for nausea.   30  tablet   1     BP 166/99  Pulse 80  Temp(Src) 98.6 F (37 C) (Oral)  Resp 16  SpO2 98%  Physical Exam  Nursing note and vitals reviewed. Constitutional: She appears well-developed and well-nourished. No distress.  HENT:  Head: Normocephalic and atraumatic.  Dried blood r nostril. No source of bleeding identified. Posterior pharynx clear.   Eyes: Conjunctivae are normal. Right eye exhibits no discharge. Left eye exhibits no discharge.  Neck: Neck supple.  Cardiovascular: Normal rate, regular rhythm and normal heart sounds.  Exam reveals no gallop and no friction rub.   No murmur heard. Pulmonary/Chest: Effort normal and breath sounds normal. No respiratory distress.  Abdominal: Soft. She exhibits no distension. There is no tenderness.  Musculoskeletal: She exhibits no edema and no tenderness.  No midline spinal tenderness. No bony tenderness of extremities.   Neurological: She is alert.  Pleasantly demented. Follows commands. No focal deficits noted.   Skin: Skin is warm and dry.  Psychiatric: She has a normal mood and affect. Her behavior is normal. Thought content normal.    ED Course  Procedures (including critical care time)  Labs Reviewed  POCT I-STAT, CHEM 8 - Abnormal; Notable for the following:    BUN 25 (*)    Glucose, Bld 122 (*)    All other components within normal limits  URINALYSIS, ROUTINE W REFLEX MICROSCOPIC   No results found.  EKG:  Rhythm: sfib Vent. rate 69 BPM PR interval * ms QRS duration 80 ms QT/QTc 348/372 ms Poor r wave progression ST segments: ns st changes Comparison: stable from previous from 07/2012  1. Epistaxis   2. Fall, initial encounter       MDM  82yF with epistaxis. Resolved. Possible fall? Hx not clear. Doesn't appear to acute emergent injury or significant metabolic/hemodynamic instability. I feel safe for transfer back to NH.         Raeford Razor, MD 08/24/12 [phone]

## 2012-10-06 ENCOUNTER — Inpatient Hospital Stay (HOSPITAL_COMMUNITY)
Admission: EM | Admit: 2012-10-06 | Discharge: 2012-10-08 | DRG: 689 | Disposition: A | Discharge status: Nursing Home (Includes ICF) | Payer: Medicare Other | Attending: Family Medicine | Admitting: Family Medicine

## 2012-10-06 ENCOUNTER — Emergency Department (HOSPITAL_COMMUNITY): Payer: Medicare Other

## 2012-10-06 ENCOUNTER — Encounter (HOSPITAL_COMMUNITY): Payer: Self-pay | Admitting: Emergency Medicine

## 2012-10-06 DIAGNOSIS — Z951 Presence of aortocoronary bypass graft: Secondary | ICD-10-CM

## 2012-10-06 DIAGNOSIS — E41 Nutritional marasmus: Secondary | ICD-10-CM | POA: Diagnosis present

## 2012-10-06 DIAGNOSIS — F3289 Other specified depressive episodes: Secondary | ICD-10-CM | POA: Diagnosis present

## 2012-10-06 DIAGNOSIS — I279 Pulmonary heart disease, unspecified: Secondary | ICD-10-CM | POA: Diagnosis present

## 2012-10-06 DIAGNOSIS — F329 Major depressive disorder, single episode, unspecified: Secondary | ICD-10-CM | POA: Diagnosis present

## 2012-10-06 DIAGNOSIS — R748 Abnormal levels of other serum enzymes: Secondary | ICD-10-CM | POA: Diagnosis present

## 2012-10-06 DIAGNOSIS — G608 Other hereditary and idiopathic neuropathies: Secondary | ICD-10-CM | POA: Diagnosis present

## 2012-10-06 DIAGNOSIS — T43505A Adverse effect of unspecified antipsychotics and neuroleptics, initial encounter: Secondary | ICD-10-CM | POA: Diagnosis present

## 2012-10-06 DIAGNOSIS — F0390 Unspecified dementia without behavioral disturbance: Secondary | ICD-10-CM | POA: Diagnosis present

## 2012-10-06 DIAGNOSIS — E1165 Type 2 diabetes mellitus with hyperglycemia: Secondary | ICD-10-CM

## 2012-10-06 DIAGNOSIS — K219 Gastro-esophageal reflux disease without esophagitis: Secondary | ICD-10-CM | POA: Diagnosis present

## 2012-10-06 DIAGNOSIS — G9340 Encephalopathy, unspecified: Secondary | ICD-10-CM | POA: Diagnosis present

## 2012-10-06 DIAGNOSIS — Z66 Do not resuscitate: Secondary | ICD-10-CM | POA: Diagnosis present

## 2012-10-06 DIAGNOSIS — M109 Gout, unspecified: Secondary | ICD-10-CM | POA: Diagnosis present

## 2012-10-06 DIAGNOSIS — I214 Non-ST elevation (NSTEMI) myocardial infarction: Secondary | ICD-10-CM

## 2012-10-06 DIAGNOSIS — E109 Type 1 diabetes mellitus without complications: Secondary | ICD-10-CM | POA: Diagnosis present

## 2012-10-06 DIAGNOSIS — I252 Old myocardial infarction: Secondary | ICD-10-CM

## 2012-10-06 DIAGNOSIS — N390 Urinary tract infection, site not specified: Principal | ICD-10-CM | POA: Diagnosis present

## 2012-10-06 DIAGNOSIS — Z8509 Personal history of malignant neoplasm of other digestive organs: Secondary | ICD-10-CM

## 2012-10-06 DIAGNOSIS — E876 Hypokalemia: Secondary | ICD-10-CM | POA: Diagnosis present

## 2012-10-06 DIAGNOSIS — I4891 Unspecified atrial fibrillation: Secondary | ICD-10-CM | POA: Diagnosis present

## 2012-10-06 DIAGNOSIS — Z993 Dependence on wheelchair: Secondary | ICD-10-CM

## 2012-10-06 DIAGNOSIS — E43 Unspecified severe protein-calorie malnutrition: Secondary | ICD-10-CM

## 2012-10-06 DIAGNOSIS — I2510 Atherosclerotic heart disease of native coronary artery without angina pectoris: Secondary | ICD-10-CM | POA: Diagnosis present

## 2012-10-06 DIAGNOSIS — C9110 Chronic lymphocytic leukemia of B-cell type not having achieved remission: Secondary | ICD-10-CM | POA: Diagnosis present

## 2012-10-06 DIAGNOSIS — I10 Essential (primary) hypertension: Secondary | ICD-10-CM | POA: Diagnosis present

## 2012-10-06 DIAGNOSIS — E785 Hyperlipidemia, unspecified: Secondary | ICD-10-CM | POA: Diagnosis present

## 2012-10-06 LAB — CBC WITH DIFFERENTIAL/PLATELET
Eosinophils Relative: 1 % (ref 0–5)
HCT: 37 % (ref 36.0–46.0)
Hemoglobin: 12.4 g/dL (ref 12.0–15.0)
Lymphocytes Relative: 25 % (ref 12–46)
Lymphs Abs: 4.6 10*3/uL — ABNORMAL HIGH (ref 0.7–4.0)
MCV: 93.4 fL (ref 78.0–100.0)
Monocytes Absolute: 1.3 10*3/uL — ABNORMAL HIGH (ref 0.1–1.0)
Monocytes Relative: 7 % (ref 3–12)
Neutro Abs: 12 10*3/uL — ABNORMAL HIGH (ref 1.7–7.7)
RDW: 14.2 % (ref 11.5–15.5)
WBC: 18.2 10*3/uL — ABNORMAL HIGH (ref 4.0–10.5)

## 2012-10-06 LAB — URINALYSIS, ROUTINE W REFLEX MICROSCOPIC
Glucose, UA: NEGATIVE mg/dL
Ketones, ur: NEGATIVE mg/dL
Protein, ur: 30 mg/dL — AB
Urobilinogen, UA: 0.2 mg/dL (ref 0.0–1.0)

## 2012-10-06 LAB — COMPREHENSIVE METABOLIC PANEL
AST: 17 U/L (ref 0–37)
BUN: 19 mg/dL (ref 6–23)
CO2: 25 mEq/L (ref 19–32)
Calcium: 10 mg/dL (ref 8.4–10.5)
Chloride: 102 mEq/L (ref 96–112)
Creatinine, Ser: 0.69 mg/dL (ref 0.50–1.10)
GFR calc Af Amer: >90 mL/min (ref >90)
GFR calc non Af Amer: 79 mL/min — ABNORMAL LOW (ref >90)
Glucose, Bld: 56 mg/dL — ABNORMAL LOW (ref 70–99)
Total Bilirubin: 0.2 mg/dL — ABNORMAL LOW (ref 0.3–1.2)

## 2012-10-06 LAB — TROPONIN I: Troponin I: 2.47 ng/mL (ref <0.30)

## 2012-10-06 LAB — URINE MICROSCOPIC-ADD ON

## 2012-10-06 MED ORDER — ONDANSETRON HCL 4 MG/2ML IJ SOLN: 4.0000 mg | Freq: Three times a day (TID) | INTRAMUSCULAR | Status: DC | PRN

## 2012-10-06 MED ORDER — DEXTROSE 5 % IV SOLN
1.0000 g | Freq: Once | INTRAVENOUS | Status: AC
  Administered 2012-10-06: 1 g via INTRAVENOUS
  Filled 2012-10-06: qty 10

## 2012-10-06 MED ORDER — SODIUM CHLORIDE 0.9 % IV SOLN
INTRAVENOUS | Status: AC
  Administered 2012-10-06: 22:00:00 via INTRAVENOUS

## 2012-10-06 MED ORDER — CEFTRIAXONE SODIUM 1 G IJ SOLR: 1.0000 g | Freq: Once | INTRAMUSCULAR | Status: DC

## 2012-10-06 NOTE — H&P (Signed)
[facility] History and Physical  [name]  YQM:[id number]  DOB: [birth year]   DOA: 10/06/2012   PCP:   Catalina Pizza, MD   Chief Complaint:  Weakness  HPI: [name] is a 79 y.o. female.   Elderly nursing home resident with multiple medical problems including CLL,  Permanent atrial fibrillation on chronic anti-coagulation,and dementia, was reportedly felt to be weak on the right side today with increased lethargy and sent to the emergency room for further evaluation and management.   By report from the emergency room, the nursing home assistant who accompanies patient to the emergency room says she has been in progressive decline for the past 2 weeks. However, by another report the mental status changes started today after an episode of twitching for about a minute.  The troponin was found to the markedly elevated in the emergency room, and emergency room physician reports a discussion with the family confirmed that she was DO NOT RESUSCITATE, they wanted no aggressive intervention such as catheterization, they would want antibiotic treatment as needed.   Rewiew of Systems:  Unable to obtain due to mental status changes    Past Medical History  Diagnosis Date  . HTN (hypertension)   . Coronary atherosclerosis of unspecified type of vessel, native or graft     a. remote CABG in 1999 with Dr. Laneta Simmers. b. s/p PCI with stent to LCX in 2009 (Platinum protocol study). c. NSTEMI 10/2011 s/p BMS to SVG->diagonal.  . Personal history of other diseases of digestive system   . Gout, unspecified     "have had it in my hands and my feet"  . Unspecified hereditary and idiopathic peripheral neuropathy   . Esophageal reflux   . Anemia   . GI bleed   . Leukocytosis 03/16/2011  . Hyperlipidemia     Statin intolerant  . Pancreas disorder     prior pancreatectomy in 2011 complicated by leak requiring prolong and repeated drainage and subsequent pancreatic stent  . Other chronic pulmonary  heart diseases   . Type I (juvenile type) diabetes mellitus without mention of complication, not stated as uncontrolled     Previously on insulin pump  . Permanent atrial fibrillation   . Anginal pain 11/24/11    "1st time"  . Exertional dyspnea 11/24/11  . Depression   . 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 CLL (chronic lymphocytic leukemia)     seeing Dr. [name]  . Myocardial infarction 1999    "on the verge of one"  . NSTEMI (non-ST elevated myocardial infarction) 10/2011    /H&P  . Stroke   . Atrial fibrillation   . Depression   . Acid reflux   . Dementia     Past Surgical History  Procedure Laterality Date  . Oophorectomy  1970's    w hysterectomy and bladder tacking procedure  . Belpharoptosis repair      right upper eyelid  . Esophagogastroduodenoscopy  07/11/2010  . Colonoscopy  10/20/06  . Tonsillectomy  1947  . Appendectomy  1970's?  . Abdominal hysterectomy  1970's  . Coronary angioplasty with stent placement  10/2011    "makes total of 4"; 20-30% ostial left main stenosis; occluded ostial LAD supplied by IMA graft, 20-30% scattered LCx stenoses with widely patent stent and 20% narrowing; diffuse RCA luminal irregularities up to 20-30%; SVG-diagonal patent, but proximal 90% stenosis s/p BMS; patent LIMA-LAD; LVEF 55-65%.  . Coronary angioplasty with stent placement  2009  LCX  . Cataract extraction w/ intraocular lens  implant, bilateral  ~ 2003  . Coronary artery bypass graft  1999    LIMA to LAD, SVG to the DX  . Pancreatectomy  2011    c/b leak requiring multiple drainage attempts and stent placement), /H&P  . Cardiac surgery    . Colonoscopy  01/20/2012    Procedure: COLONOSCOPY;  Surgeon: Malissa Hippo, MD;  Location: [facility];  Service: Endoscopy;  Laterality: N/A;    Medications:  HOME MEDS: Prior to Admission medications   Medication Sig Start Date End Date Taking? Authorizing Provider  acetaminophen (TYLENOL) 325  MG tablet Take 650 mg by mouth 2 (two) times daily.   Yes Historical Provider, MD  Cholecalciferol 4000 UNITS CAPS Take 4,000 Units by mouth daily.   Yes Historical Provider, MD  docusate sodium (COLACE) 100 MG capsule Take 100 mg by mouth daily.    Yes Historical Provider, MD  feeding supplement (PRO-STAT SUGAR FREE 64) LIQD Take 30 mLs by mouth daily.   Yes Historical Provider, MD  insulin aspart (NOVOLOG) 100 UNIT/ML injection Inject 5-15 Units into the skin 3 (three) times daily before meals. Patient takes 5 units before breakfast and 15 units before lunch and dinner 07/04/12  Yes Lesle Chris Black, NP  insulin glargine (LANTUS) 100 UNIT/ML injection Inject 45 Units into the skin at bedtime.  07/04/12  Yes Lesle Chris Black, NP  LORazepam (ATIVAN) 0.5 MG tablet Take 0.5 mg by mouth 2 (two) times daily as needed for anxiety (agitation).    Yes Historical Provider, MD  magnesium oxide (MAG-OX) 400 MG tablet Take 400 mg by mouth 3 (three) times daily.    Yes Historical Provider, MD  megestrol (MEGACE) 40 MG/ML suspension Take 400 mg by mouth every morning.   Yes Historical Provider, MD  metoprolol tartrate (LOPRESSOR) 25 MG tablet Take 12.5 mg by mouth 2 (two) times daily.  11/14/11 11/13/12 Yes Dayna N Dunn, PA-C  Multiple Vitamin (DAILY VITE PO) Take 1 tablet by mouth daily.   Yes Historical Provider, MD  olmesartan (BENICAR) 40 MG tablet Take 40 mg by mouth daily. 11/14/11  Yes Dayna N Dunn, PA-C  omeprazole (PRILOSEC) 20 MG capsule Take 20 mg by mouth daily.   Yes Historical Provider, MD  polyethylene glycol (MIRALAX / GLYCOLAX) packet Take 17 g by mouth daily.   Yes Historical Provider, MD  QUEtiapine (SEROQUEL) 25 MG tablet Take 12.5 mg by mouth daily.    Yes Historical Provider, MD  Rivaroxaban (XARELTO) 20 MG TABS Take 20 mg by mouth daily.   Yes Historical Provider, MD  vitamin C (ASCORBIC ACID) 500 MG tablet Take 500 mg by mouth 2 (two) times daily.   Yes Historical Provider, MD  colchicine 0.6 MG tablet  Take 0.6 mg by mouth daily as needed. Gout Flare    Historical Provider, MD  nitroGLYCERIN (NITROSTAT) 0.4 MG SL tablet Place 1 tablet (0.4 mg total) under the tongue every 5 (five) minutes x 3 doses as needed for chest pain. 11/14/11 11/13/12  Dayna N Dunn, PA-C  ondansetron (ZOFRAN) 4 MG tablet Take 1 tablet (4 mg total) by mouth every 8 (eight) hours as needed for nausea. 12/28/11   Malissa Hippo, MD     Allergies:  Allergies  Allergen Reactions  . Codeine Nausea And Vomiting  . Ivp Dye (Iodinated Diagnostic Agents) Other (See Comments)    Burst veins in her arm.   . Morphine Other (See Comments)  Not in Right State of Mind.   . Prednisone Other (See Comments)    Patient became delirious/combative when given 60mg  dose  . Statins Other (See Comments)    Leg Cramps.     Social History:   reports that she has never smoked. She has never used smokeless tobacco. She reports that she does not drink alcohol or use illicit drugs.  Family History: Family History  Problem Relation Age of Onset  . Stroke Mother   . Lung cancer Father   . Healthy Son      Physical Exam: Filed Vitals:   10/06/12 1805 10/06/12 1930 10/06/12 2030  BP: 140/72 119/54 119/63  Pulse: 75 67 68  Temp: 98.9 F (37.2 C)    Resp: 18 20 18   Height: 5\' 3"  (1.6 m)    Weight: 61.236 kg (135 lb)    SpO2: 97% 100% 100%   Blood pressure 119/63, pulse 68, temperature 98.9 F (37.2 C), resp. rate 18, height 5\' 3"  (1.6 m), weight 61.236 kg (135 lb), SpO2 100.00%. Body mass index is 23.92 kg/(m^2).   GEN:  Pleasant elderly Caucasian lady lying bed in no acute distress; does not respond to unable to cooperative with exam PSYCH:  Alert; does not appear to be oriented;  not combative HEENT: Mucous membranes pink, dry and anicteric;  Breasts:: Not examined CHEST WALL: No tenderness CHEST: Normal respiration, clear to auscultation bilaterally HEART: Irregular rhythm;  BACK: Mild kyphosis ; no CVA  tenderness ABDOMEN: Obese, soft non-tender; no masses,  Rectal Exam: Not done EXTREMITIES: ; age-appropriate arthropathy of the hands and knees; no edema; no ulcerations. Genitalia: not examined PULSES: 2+ and symmetric SKIN: Normal hydration; actinic purpura of arm CNS: Cranial nerves 3-12 grossly intact no focal lateralizing neurologic deficit   Labs on Admission:  Basic Metabolic Panel:  Recent Labs Lab 10/06/12 1859  NA 141  K 2.9*  CL 102  CO2 25  GLUCOSE 56*  BUN 19  CREATININE 0.69  CALCIUM 10.0   Liver Function Tests:  Recent Labs Lab 10/06/12 1859  AST 17  ALT 11  ALKPHOS 70  BILITOT 0.2*  PROT 7.3  ALBUMIN 3.1*   No results found for this basename: LIPASE, AMYLASE,  in the last 168 hours No results found for this basename: AMMONIA,  in the last 168 hours CBC:  Recent Labs Lab 10/06/12 1859  WBC 18.2*  NEUTROABS 12.0*  HGB 12.4  HCT 37.0  MCV 93.4  PLT 579*   Cardiac Enzymes:  Recent Labs Lab 10/06/12 1859  TROPONINI 2.47*   BNP: No components found with this basename: POCBNP,  D-dimer: No components found with this basename: D-DIMER,  CBG: No results found for this basename: GLUCAP,  in the last 168 hours  Radiological Exams on Admission: Ct Head Wo Contrast  10/06/2012   *RADIOLOGY REPORT*  Clinical Data: Severe headaches  CT HEAD WITHOUT CONTRAST  Technique:  Contiguous axial images were obtained from the base of the skull through the vertex without contrast.  Comparison: 08/19/2012  Findings: Moderate atrophy.  Chronic infarct right frontal lobe unchanged.  Chronic microvascular ischemia in the white matter. Chronic right PICA infarct unchanged.  Negative for acute infarct, hemorrhage, or mass lesion.  IMPRESSION: Atrophy and chronic ischemia.  No acute abnormality.   Original Report Authenticated By: Janeece Riggers, M.D.     Assessment/Plan   Active Problems:   Non-ST elevation myocardial infarction (NSTEMI), initial episode of  care   UTI (urinary tract infection)  Encephalopathy acute   UTI (lower urinary tract infection)   Dementia Diabetes type 2 Chronic atrial fibrillation History of CLL    PLAN: Admit this lady for supportive care Rocephin for treatment of urinary tract infection With gentle IV fluid hydration A diabetic diet with sliding scale insulin, but will hold Lantus for the time being Continues to cycle cardiac enzymes If twitching episodes occur with changes in mental status, consider evaluation for seizure Repletion of potassium   Other plans as per orders.  Code Status: DO NOT RESUSCITATE Family Communication: No family available at this time Disposition Plan: Depending on response to therapy   Callie Facey Nocturnist [facility] Pager [phone]   10/06/2012, 9:37 PM

## 2012-10-06 NOTE — ED Notes (Signed)
CRITICAL VALUE ALERT  Critical value received:  Trop 2.47  Date of notification:  10/06/12 Time of notification:  2002  Critical value read back:yes Nurse who received alert:  Charlette Caffey, RN  MD notified: Dr. Hyacinth Meeker Time: 2003

## 2012-10-06 NOTE — ED Provider Notes (Signed)
History    CSN: [id number] Arrival date & time 10/06/12  1803  First MD Initiated Contact with Patient 10/06/12 1804     Chief Complaint  Patient presents with  . Weakness   (Consider location/radiation/quality/duration/timing/severity/associated sxs/prior Treatment) HPI Comments: And 79 year old female with a history of dementia, hypertension, coronary disease status post bypass grafting in 1999, diabetes, tumor of the pancreas and atrial fibrillation. She presents to the hospital with a complaint of change in mental status according to the nursing home report and the paramedic report. The patient has dementia, and due to her altered mental status she cannot get any extra information, thus a level V caveat applies. The reported history per the paramedics was that the patient started twitching, was noted to have right-sided weakness, and went unresponsive not speaking when asked questions. She was awake and alert but not responding to the nursing home staff. It is unsure the exact timeframe of the onset of the right-sided weakness. According to the medical record the patient takes xarelto as a blood thinner for the atrial fibrillation and has recently had an increase in her Seroquel to twice a day from only at night.  Patient is a 79 y.o. female presenting with weakness. The history is provided by the patient, the EMS personnel and the nursing home.  Weakness   Past Medical History  Diagnosis Date  . HTN (hypertension)   . Coronary atherosclerosis of unspecified type of vessel, native or graft     a. remote CABG in 1999 with Dr. Laneta Simmers. b. s/p PCI with stent to LCX in 2009 (Platinum protocol study). c. NSTEMI 10/2011 s/p BMS to SVG->diagonal.  . Personal history of other diseases of digestive system   . Gout, unspecified     "have had it in my hands and my feet"  . Unspecified hereditary and idiopathic peripheral neuropathy   . Esophageal reflux   . Anemia   . GI bleed   . Leukocytosis  03/16/2011  . Hyperlipidemia     Statin intolerant  . Pancreas disorder     prior pancreatectomy in 2011 complicated by leak requiring prolong and repeated drainage and subsequent pancreatic stent  . Other chronic pulmonary heart diseases   . Type I (juvenile type) diabetes mellitus without mention of complication, not stated as uncontrolled     Previously on insulin pump  . Permanent atrial fibrillation   . Anginal pain 11/24/11    "1st time"  . Exertional dyspnea 11/24/11  . Depression   . 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 CLL (chronic lymphocytic leukemia)     seeing Dr. [name]  . Myocardial infarction 1999    "on the verge of one"  . NSTEMI (non-ST elevated myocardial infarction) 10/2011    /H&P  . Stroke   . Atrial fibrillation   . Depression   . Acid reflux   . Dementia    Past Surgical History  Procedure Laterality Date  . Oophorectomy  1970's    w hysterectomy and bladder tacking procedure  . Belpharoptosis repair      right upper eyelid  . Esophagogastroduodenoscopy  07/11/2010  . Colonoscopy  10/20/06  . Tonsillectomy  1947  . Appendectomy  1970's?  . Abdominal hysterectomy  1970's  . Coronary angioplasty with stent placement  10/2011    "makes total of 4"; 20-30% ostial left main stenosis; occluded ostial LAD supplied by IMA graft, 20-30% scattered LCx stenoses with widely patent stent and 20% narrowing; diffuse RCA  luminal irregularities up to 20-30%; SVG-diagonal patent, but proximal 90% stenosis s/p BMS; patent LIMA-LAD; LVEF 55-65%.  . Coronary angioplasty with stent placement  2009    LCX  . Cataract extraction w/ intraocular lens  implant, bilateral  ~ 2003  . Coronary artery bypass graft  1999    LIMA to LAD, SVG to the DX  . Pancreatectomy  2011    c/b leak requiring multiple drainage attempts and stent placement), /H&P  . Cardiac surgery    . Colonoscopy  01/20/2012    Procedure: COLONOSCOPY;  Surgeon: Malissa Hippo, MD;   Location: [facility];  Service: Endoscopy;  Laterality: N/A;   Family History  Problem Relation Age of Onset  . Stroke Mother   . Lung cancer Father   . Healthy Son    History  Substance Use Topics  . Smoking status: Never Smoker   . Smokeless tobacco: Never Used  . Alcohol Use: No   OB History   Grav Para Term Preterm Abortions TAB SAB Ect Mult Living                 Review of Systems  Unable to perform ROS: Dementia  Neurological: Positive for weakness.    Allergies  Codeine; Ivp dye; Morphine; Prednisone; and Statins  Home Medications   Current Outpatient Rx  Name  Route  Sig  Dispense  Refill  . acetaminophen (TYLENOL) 325 MG tablet   Oral   Take 650 mg by mouth 2 (two) times daily.         . Cholecalciferol 4000 UNITS CAPS   Oral   Take 4,000 Units by mouth daily.         Marland Kitchen docusate sodium (COLACE) 100 MG capsule   Oral   Take 100 mg by mouth daily.          . feeding supplement (PRO-STAT SUGAR FREE 64) LIQD   Oral   Take 30 mLs by mouth daily.         . insulin aspart (NOVOLOG) 100 UNIT/ML injection   Subcutaneous   Inject 5-15 Units into the skin 3 (three) times daily before meals. Patient takes 5 units before breakfast and 15 units before lunch and dinner         . insulin glargine (LANTUS) 100 UNIT/ML injection   Subcutaneous   Inject 45 Units into the skin at bedtime.          Marland Kitchen LORazepam (ATIVAN) 0.5 MG tablet   Oral   Take 0.5 mg by mouth 2 (two) times daily as needed for anxiety (agitation).          . magnesium oxide (MAG-OX) 400 MG tablet   Oral   Take 400 mg by mouth 3 (three) times daily.          . megestrol (MEGACE) 40 MG/ML suspension   Oral   Take 400 mg by mouth every morning.         . metoprolol tartrate (LOPRESSOR) 25 MG tablet   Oral   Take 12.5 mg by mouth 2 (two) times daily.          . Multiple Vitamin (DAILY VITE PO)   Oral   Take 1 tablet by mouth daily.         Marland Kitchen olmesartan (BENICAR) 40  MG tablet   Oral   Take 40 mg by mouth daily.         Marland Kitchen omeprazole (PRILOSEC) 20 MG capsule  Oral   Take 20 mg by mouth daily.         . polyethylene glycol (MIRALAX / GLYCOLAX) packet   Oral   Take 17 g by mouth daily.         . QUEtiapine (SEROQUEL) 25 MG tablet   Oral   Take 12.5 mg by mouth daily.          . Rivaroxaban (XARELTO) 20 MG TABS   Oral   Take 20 mg by mouth daily.         . vitamin C (ASCORBIC ACID) 500 MG tablet   Oral   Take 500 mg by mouth 2 (two) times daily.         . colchicine 0.6 MG tablet   Oral   Take 0.6 mg by mouth daily as needed. Gout Flare         . nitroGLYCERIN (NITROSTAT) 0.4 MG SL tablet   Sublingual   Place 1 tablet (0.4 mg total) under the tongue every 5 (five) minutes x 3 doses as needed for chest pain.   25 tablet   4   . ondansetron (ZOFRAN) 4 MG tablet   Oral   Take 1 tablet (4 mg total) by mouth every 8 (eight) hours as needed for nausea.   30 tablet   1    BP 129/62  Pulse 71  Temp(Src) 98.9 F (37.2 C)  Resp 19  Ht 5\' 3"  (1.6 m)  Wt 135 lb (61.236 kg)  BMI 23.92 kg/m2  SpO2 100% Physical Exam  Nursing note and vitals reviewed. Constitutional: She appears well-developed and well-nourished. No distress.  HENT:  Head: Normocephalic and atraumatic.  Mouth/Throat: Oropharynx is clear and moist. No oropharyngeal exudate.  Eyes: Conjunctivae and EOM are normal. Pupils are equal, round, and reactive to light. Right eye exhibits no discharge. Left eye exhibits no discharge. No scleral icterus.  Neck: Normal range of motion. Neck supple. No JVD present. No thyromegaly present.  Cardiovascular: Normal heart sounds and intact distal pulses.  Exam reveals no gallop and no friction rub.   No murmur heard. Irregularly irregular rhythm, strong pulses at the radial arteries, no carotid bruits, normal capillary refill  Pulmonary/Chest: Effort normal and breath sounds normal. No respiratory distress. She has no  wheezes. She has no rales.  Abdominal: Soft. Bowel sounds are normal. She exhibits no distension and no mass. There is no tenderness.  Musculoskeletal: Normal range of motion. She exhibits no edema and no tenderness.  Lymphadenopathy:    She has no cervical adenopathy.  Neurological: She is alert.  At baseline the patient is minimally ambulatory with a significant assistance according to the sister who is here with the patient. She is able to raise both arms up off the bed, the left arm has normal strength in grip, right arm has weak grip and decreased strength and she is unable to lift this as high as the left side. Her bilateral lower extremities are equal in strength, 4/5 bilaterally. Cranial nerves III through XII appear to be intact, she is able to whisper her name appropriately without any slurred speech. Pupils are 2 mm and reactive bilaterally  Skin: Skin is warm and dry. No rash noted. No erythema.  Psychiatric: She has a normal mood and affect. Her behavior is normal.    ED Course  Procedures (including critical care time) Labs Reviewed  CBC WITH DIFFERENTIAL - Abnormal; Notable for the following:    WBC 18.2 (*)  Platelets 579 (*)    Neutro Abs 12.0 (*)    Lymphs Abs 4.6 (*)    Monocytes Absolute 1.3 (*)    All other components within normal limits  COMPREHENSIVE METABOLIC PANEL - Abnormal; Notable for the following:    Potassium 2.9 (*)    Glucose, Bld 56 (*)    Albumin 3.1 (*)    Total Bilirubin 0.2 (*)    GFR calc non Af Amer 79 (*)    All other components within normal limits  TROPONIN I - Abnormal; Notable for the following:    Troponin I 2.47 (*)    All other components within normal limits  URINALYSIS, ROUTINE W REFLEX MICROSCOPIC - Abnormal; Notable for the following:    Protein, ur 30 (*)    Leukocytes, UA SMALL (*)    All other components within normal limits  URINE MICROSCOPIC-ADD ON - Abnormal; Notable for the following:    Bacteria, UA MANY (*)    All  other components within normal limits  URINE CULTURE  LACTIC ACID, PLASMA  PROTIME-INR   Ct Head Wo Contrast  10/06/2012   *RADIOLOGY REPORT*  Clinical Data: Severe headaches  CT HEAD WITHOUT CONTRAST  Technique:  Contiguous axial images were obtained from the base of the skull through the vertex without contrast.  Comparison: 08/19/2012  Findings: Moderate atrophy.  Chronic infarct right frontal lobe unchanged.  Chronic microvascular ischemia in the white matter. Chronic right PICA infarct unchanged.  Negative for acute infarct, hemorrhage, or mass lesion.  IMPRESSION: Atrophy and chronic ischemia.  No acute abnormality.   Original Report Authenticated By: Janeece Riggers, M.D.   1. Encephalopathy acute   2. UTI (lower urinary tract infection)     MDM  The patient has a focal neurologic deficit of her right upper extremity, she has an associated mild tremor of the right hand but is able to follow commands. She is unable to answer my questions other than to tell me her name. The sitter who was in the room with her states that this has been her baseline for the last 2 weeks though she does note that one month ago she was more bright and talkative. Will proceed with workup for metabolic and ischemic sources of her symptoms, urinalysis to evaluate for infectious etiologies and an EKG and troponin for cardiac etiology. She is clinically in atrial fibrillation but does have a history of this.  ED ECG REPORT  I personally interpreted this EKG   Date: 10/06/2012   Rate: 88  Rhythm: atrial fibrillation  QRS Axis: normal  Intervals: normal  ST/T Wave abnormalities: nonspecific T wave changes  Conduction Disutrbances:none  Narrative Interpretation:   Old EKG Reviewed: Compared with 08/19/2012, rate is slightly faster, T wave abnormality slightly more prominent in the inferior and lateral precordial leads  Vital signs remain in very stable range however her laboratory workup has been significantly  abnormal. Her troponin is positive at 2.47 however on review of her past medical record she has had elevated troponins in the past one year ago which at that time cardiology did not think were related to a cardiac problem. I have discussed her care with her family who state that they do not want her to be transferred to a higher level of care for her catheterization but would rather stick with conservative management. Laboratory workup also shows mild hypokalemia and a leukocytosis. Her urinalysis confirms a urinary tract infection.  CT scan of the head is negative for acute  infarction  Care was discussed with the hospitalist who will admit. Antibiotics have been ordered.   Vida Roller, MD 10/06/12 2158

## 2012-10-06 NOTE — ED Notes (Signed)
Pt was sitting at dinner and began having "muscle twitches" per [employer]. Pt was awake but not responding during episode. Upon EMS arrival pt was weak. Pt appears to be at baseline upon ED arrival per sitter.

## 2012-10-06 NOTE — ED Notes (Signed)
Pt daughter-in-law called and left her contact information to receive pt updates. Pt daughter-in-law,Kim [place], [phone].

## 2012-10-07 ENCOUNTER — Observation Stay (HOSPITAL_COMMUNITY): Payer: Medicare Other

## 2012-10-07 ENCOUNTER — Encounter (HOSPITAL_COMMUNITY): Payer: Self-pay | Admitting: *Deleted

## 2012-10-07 DIAGNOSIS — G9340 Encephalopathy, unspecified: Secondary | ICD-10-CM | POA: Diagnosis present

## 2012-10-07 DIAGNOSIS — N390 Urinary tract infection, site not specified: Secondary | ICD-10-CM | POA: Diagnosis present

## 2012-10-07 DIAGNOSIS — F0390 Unspecified dementia without behavioral disturbance: Secondary | ICD-10-CM | POA: Diagnosis present

## 2012-10-07 LAB — CBC
MCH: 31.4 pg (ref 26.0–34.0)
MCHC: 33.6 g/dL (ref 30.0–36.0)
Platelets: 562 10*3/uL — ABNORMAL HIGH (ref 150–400)
RBC: 3.76 MIL/uL — ABNORMAL LOW (ref 3.87–5.11)
RDW: 14 % (ref 11.5–15.5)

## 2012-10-07 LAB — BASIC METABOLIC PANEL
Calcium: 9.7 mg/dL (ref 8.4–10.5)
Creatinine, Ser: 0.58 mg/dL (ref 0.50–1.10)
GFR calc non Af Amer: 84 mL/min — ABNORMAL LOW (ref >90)
Glucose, Bld: 84 mg/dL (ref 70–99)
Sodium: 139 mEq/L (ref 135–145)

## 2012-10-07 LAB — GLUCOSE, CAPILLARY
Glucose-Capillary: 81 mg/dL (ref 70–99)
Glucose-Capillary: 232 mg/dL — ABNORMAL HIGH (ref 70–99)

## 2012-10-07 LAB — HEPATIC FUNCTION PANEL
Alkaline Phosphatase: 66 U/L (ref 39–117)
Bilirubin, Direct: <0.1 mg/dL (ref 0.0–0.3)
Total Protein: 6.7 g/dL (ref 6.0–8.3)

## 2012-10-07 LAB — MAGNESIUM: Magnesium: 1.4 mg/dL — ABNORMAL LOW (ref 1.5–2.5)

## 2012-10-07 LAB — TROPONIN I: Troponin I: 2.3 ng/mL (ref <0.30)

## 2012-10-07 MED ORDER — INSULIN ASPART 100 UNIT/ML ~~LOC~~ SOLN
0.0000 [IU] | Freq: Every day | SUBCUTANEOUS | Status: DC
  Administered 2012-10-07: 2 [IU] via SUBCUTANEOUS

## 2012-10-07 MED ORDER — ACETAMINOPHEN 650 MG RE SUPP: 650.0000 mg | Freq: Four times a day (QID) | RECTAL | Status: DC | PRN

## 2012-10-07 MED ORDER — METOPROLOL TARTRATE 25 MG PO TABS
12.5000 mg | ORAL_TABLET | Freq: Two times a day (BID) | ORAL | Status: DC
  Administered 2012-10-07 – 2012-10-08 (×4): 12.5 mg via ORAL
  Filled 2012-10-07 (×4): qty 1

## 2012-10-07 MED ORDER — MAGNESIUM SULFATE 40 MG/ML IJ SOLN
2.0000 g | Freq: Once | INTRAMUSCULAR | Status: AC
  Administered 2012-10-07: 2 g via INTRAVENOUS
  Filled 2012-10-07: qty 50

## 2012-10-07 MED ORDER — DEXTROSE 5 % IV SOLN
1.0000 g | INTRAVENOUS | Status: DC
  Administered 2012-10-07: 1 g via INTRAVENOUS
  Filled 2012-10-07 (×3): qty 10

## 2012-10-07 MED ORDER — NITROGLYCERIN 0.4 MG SL SUBL: 0.4000 mg | SUBLINGUAL_TABLET | SUBLINGUAL | Status: DC | PRN

## 2012-10-07 MED ORDER — BOOST / RESOURCE BREEZE PO LIQD
1.0000 | Freq: Three times a day (TID) | ORAL | Status: DC
  Administered 2012-10-07 – 2012-10-08 (×2): 1 via ORAL

## 2012-10-07 MED ORDER — MEGESTROL ACETATE 400 MG/10ML PO SUSP
400.0000 mg | Freq: Every morning | ORAL | Status: DC
  Administered 2012-10-07 – 2012-10-08 (×2): 400 mg via ORAL
  Filled 2012-10-07 (×3): qty 10

## 2012-10-07 MED ORDER — COLCHICINE 0.6 MG PO TABS
0.6000 mg | ORAL_TABLET | Freq: Every day | ORAL | Status: DC
  Administered 2012-10-07 – 2012-10-08 (×2): 0.6 mg via ORAL
  Filled 2012-10-07 (×2): qty 1

## 2012-10-07 MED ORDER — SODIUM CHLORIDE 0.9 % IJ SOLN
3.0000 mL | Freq: Two times a day (BID) | INTRAMUSCULAR | Status: DC
  Administered 2012-10-07: 3 mL via INTRAVENOUS

## 2012-10-07 MED ORDER — LORAZEPAM 0.5 MG PO TABS: 0.5000 mg | ORAL_TABLET | Freq: Two times a day (BID) | ORAL | Status: DC | PRN

## 2012-10-07 MED ORDER — ONDANSETRON HCL 4 MG PO TABS: 4.0000 mg | ORAL_TABLET | Freq: Four times a day (QID) | ORAL | Status: DC | PRN

## 2012-10-07 MED ORDER — PANTOPRAZOLE SODIUM 40 MG PO TBEC
40.0000 mg | DELAYED_RELEASE_TABLET | Freq: Every day | ORAL | Status: DC
  Administered 2012-10-07 – 2012-10-08 (×2): 40 mg via ORAL
  Filled 2012-10-07 (×2): qty 1

## 2012-10-07 MED ORDER — POLYETHYLENE GLYCOL 3350 17 G PO PACK
17.0000 g | PACK | Freq: Every day | ORAL | Status: DC
  Administered 2012-10-07 – 2012-10-08 (×2): 17 g via ORAL
  Filled 2012-10-07 (×2): qty 1

## 2012-10-07 MED ORDER — BIOTENE DRY MOUTH MT LIQD
15.0000 mL | Freq: Two times a day (BID) | OROMUCOSAL | Status: DC
  Administered 2012-10-07 – 2012-10-08 (×3): 15 mL via OROMUCOSAL

## 2012-10-07 MED ORDER — IRBESARTAN 75 MG PO TABS
75.0000 mg | ORAL_TABLET | Freq: Every day | ORAL | Status: DC
  Administered 2012-10-07 – 2012-10-08 (×2): 75 mg via ORAL
  Filled 2012-10-07 (×2): qty 1

## 2012-10-07 MED ORDER — PRO-STAT SUGAR FREE PO LIQD
30.0000 mL | Freq: Three times a day (TID) | ORAL | Status: DC
  Administered 2012-10-08 (×2): 30 mL via ORAL
  Filled 2012-10-07 (×2): qty 30

## 2012-10-07 MED ORDER — RIVAROXABAN 10 MG PO TABS
20.0000 mg | ORAL_TABLET | Freq: Every day | ORAL | Status: DC
  Administered 2012-10-07 – 2012-10-08 (×2): 20 mg via ORAL
  Filled 2012-10-07 (×2): qty 2

## 2012-10-07 MED ORDER — QUETIAPINE FUMARATE 25 MG PO TABS
12.5000 mg | ORAL_TABLET | Freq: Every day | ORAL | Status: DC
  Administered 2012-10-07: 12.5 mg via ORAL
  Filled 2012-10-07: qty 1

## 2012-10-07 MED ORDER — ACETAMINOPHEN 325 MG PO TABS: 650.0000 mg | ORAL_TABLET | Freq: Four times a day (QID) | ORAL | Status: DC | PRN

## 2012-10-07 MED ORDER — QUETIAPINE FUMARATE 25 MG PO TABS: 12.5000 mg | ORAL_TABLET | Freq: Every day | ORAL | Status: DC

## 2012-10-07 MED ORDER — FLEET ENEMA 7-19 GM/118ML RE ENEM: 1.0000 | ENEMA | Freq: Once | RECTAL | Status: AC | PRN

## 2012-10-07 MED ORDER — INSULIN ASPART 100 UNIT/ML ~~LOC~~ SOLN
0.0000 [IU] | Freq: Three times a day (TID) | SUBCUTANEOUS | Status: DC
  Administered 2012-10-08 (×2): 2 [IU] via SUBCUTANEOUS

## 2012-10-07 MED ORDER — ONDANSETRON HCL 4 MG/2ML IJ SOLN: 4.0000 mg | Freq: Four times a day (QID) | INTRAMUSCULAR | Status: DC | PRN

## 2012-10-07 NOTE — Clinical Documentation Improvement (Signed)
THIS DOCUMENT IS NOT A PERMANENT PART OF THE MEDICAL RECORD  Please update your documentation with the medical record to reflect your response to this query. If you need help knowing how to do this please call [phone].  10/07/12   Dear Dr. [facility] Limbo / Associates,  In a better effort to capture your patient's severity of illness, reflect appropriate length of stay and utilization of resources, a review of the patient medical record has revealed the following indicators.   Based on your clinical judgment, please clarify and document in a progress note and/or discharge summary the clinical condition associated with the following supporting information: In responding to this query please exercise your independent judgment.  The fact that a query is asked, does not imply that any particular answer is desired or expected.  Please clarify Nutritional status. Thank you.  Possible Clinical Conditions?  Mild Malnutrition  Moderate Malnutrition Severe Malnutrition   Protein Calorie Malnutrition Severe Protein Calorie Malnutrition Other Condition________________ Cannot clinically determine   Supporting Information: Risk Factors: Per RD assessment  Per approved criteria   -Severe malnutrition in the context of chronic illness   decreased appetite and dementia  severe muscle wasting   Signs & Symptoms: Ht 5'3"  Wt 130lbs BMI:  23.18  Weight  Loss 31 lbs in 9 months  Treatment  Carb modified diet Megace I&O Daily weights Nutrition Consult NUTRITION DIAGNOSIS:  Inadequate oral intake related to decreased appetite as evidenced by 0% ,meal intake and 31# wt loss over past 9 months.  Goal:  Pt to meet >/= 90% of their estimated nutrition needs  Monitor:  Po intake, labs and wt trends  INTERVENTION:  Resource Breeze po TID, each supplement provides 250 kcal and 9 grams of protein. ProStat 30 ml BID (each 30 ml provides 100 kcal, 15 gr protein)  Recommend assist with feeding   Cranberry juice daily with breakfast or meds   You may use possible, probable, or suspect with inpatient documentation. possible, probable, suspected diagnoses MUST be documented at the time of discharge  Reviewed: additional documentation in the medical record  Thank Angelene Giovanni  RN Clinical Documentation Specialist: [phone] [facility] Health Information Management [place]

## 2012-10-07 NOTE — Evaluation (Signed)
Physical Therapy Evaluation Patient Details Name: [name] MRN: [id number] DOB: May 01, 1929 Today's Date: 10/07/2012 Time: 0933-1000 PT Time Calculation (min): 27 min  PT Assessment / Plan / Recommendation History of Present Illness  Pt is NH resident, primarily w/c bound with advanced dementia.  She has chronic UTIs, atrial fibrilation on anti-cooagulation.  Clinical Impression  She was seen for evaluation.  She was unable to effectively follow any directions and became mildly agitated as we worked with her.  She has increased flexor tone and is tending toward the fetal position, although this is mild at this point.  She required total assist to transfer bed to chair.  Unless her cognition improves, she is not appropriate for PT.   PT Assessment  Patent does not need any further PT services    Follow Up Recommendations  No PT follow up    Does the patient have the potential to tolerate intense rehabilitation    No  Barriers to Discharge  None      Equipment Recommendations  None recommended by PT    Recommendations for Other Services     Frequency      Precautions / Restrictions Precautions Precautions: Fall Restrictions Weight Bearing Restrictions: No   Pertinent Vitals/Pain       Mobility  Bed Mobility Bed Mobility: Supine to Sit;Sitting - Scoot to Edge of Bed Supine to Sit: 1: +1 Total assist Scooting to HOB: 1: +1 Total assist Transfers Transfers: Stand Pivot Transfers Sit to Stand: 1: +1 Total assist;From bed Stand to Sit: 1: +1 Total assist;To bed Stand Pivot Transfers: 1: +1 Total assist Details for Transfer Assistance: pt is only agreeable to minimally weight bear...essentiallly no cooperation Ambulation/Gait Ambulation/Gait Assistance: Not tested (comment) (unable)    Exercises     PT Diagnosis:    PT Problem List:   PT Treatment Interventions:       PT Goals(Current goals can be found in the care plan section)    Visit Information  Last PT Received On: 10/07/12 Assistance Needed: +2 PT/OT Co-Evaluation/Treatment: Yes History of Present Illness: Pt is NH resident, primarily w/c bound with advanced dementia.  She has chronic UTIs, atrial fibrilation on anti-cooagulation.       Prior Functioning  Home Living Family/patient expects to be discharged to:: Assisted living Home Equipment: Wheelchair - manual Additional Comments: pt is unable to give hx details Prior Function Level of Independence: Needs assistance Gait / Transfers Assistance Needed: w.c dependent ADL's / Homemaking Assistance Needed: total assist Communication / Swallowing Assistance Needed: Communication did not make much sense due to advanced dementia. Comments: Was receiving in house PT through [facility].  Communication Communication: Expressive difficulties (Due to adavanced dementia.) Dominant Hand:  (unknown)    Cognition  Cognition Arousal/Alertness: Awake/alert Behavior During Therapy: Agitated (toward end of visit) Overall Cognitive Status: History of cognitive impairments - at baseline (as best as we can tell) Memory: Decreased short-term memory    Extremity/Trunk Assessment Upper Extremity Assessment Upper Extremity Assessment: Generalized weakness;RUE deficits/detail;LUE deficits/detail;Difficult to assess due to impaired cognition RUE Deficits / Details: Patient unable to raise RUE to shoulder level. PROM was [facility]. Patient had difficulty following one step commands. LUE Deficits / Details: Patient unable to raise LUE to shoulder level. PROM was [facility]. Patient had difficulty following one step commands. Lower Extremity Assessment Lower Extremity Assessment: RLE deficits/detail RLE Deficits / Details: unable to fully assess due to pt's inability to follow any directions...she has increased flexor tone and tends to be  most comfortable in semi-fetal position Cervical / Trunk Assessment Cervical / Trunk Assessment: Kyphotic (only 20%  cervical rotation left or right)   Balance Balance Balance Assessed: Yes Static Sitting Balance Static Sitting - Balance Support: Bilateral upper extremity supported;Feet supported Static Sitting - Level of Assistance: 5: Stand by assistance (tends to fall to the left)  End of Session PT - End of Session Equipment Utilized During Treatment: Gait belt Activity Tolerance: Patient limited by fatigue;Treatment limited secondary to agitation Patient left: in chair;with call bell/phone within reach;with chair alarm set Nurse Communication: Mobility status  GP Functional Assessment Tool Used: clinical judgement Functional Limitation: Mobility: Walking and moving around Mobility: Walking and Moving Around Current Status (W0981): 100 percent impaired, limited or restricted Mobility: Walking and Moving Around Goal Status (X9147): 100 percent impaired, limited or restricted Mobility: Walking and Moving Around Discharge Status [phone]): 100 percent impaired, limited or restricted   Myrlene Broker L 10/07/2012, 10:21 AM

## 2012-10-07 NOTE — Progress Notes (Signed)
UR chart review completed.  

## 2012-10-07 NOTE — Clinical Social Work Psychosocial (Signed)
Clinical Social Work Department BRIEF PSYCHOSOCIAL ASSESSMENT 10/07/2012  Patient:  [name], [name]     Account Number:  [id number]     Admit date:  10/06/2012  Clinical Social Worker:  Nancie Neas  Date/Time:  10/07/2012 11:15 AM  Referred by:  CSW  Date Referred:  10/07/2012 Referred for  ALF Placement   Other Referral:   Interview type:  Family Other interview type:   daughter-in-law: Kim    PSYCHOSOCIAL DATA Living Status:  FACILITY Admitted from facility:  [facility] Level of care:  Assisted Living Primary support name:  Loraine Leriche and Selena Batten Primary support relationship to patient:  CHILD, ADULT Degree of support available:   supportive    CURRENT CONCERNS Current Concerns  Post-Acute Placement   Other Concerns:    SOCIAL WORK ASSESSMENT / PLAN CSW spoke with [name] as pt is not oriented. Pt has been a resident at [facility] [place] unit for about 6 months. She does have diagnosis of dementia. Selena Batten reports family visit several times a week. Per Marchelle Folks at facility, pt has had about 2 UTIs a month for the past 6 months. When she completes antibiotics, she gets another UTI. Pt requires much more assistance when she has a UTI, typically several person assist. She has been receiving in house home health PT, OT. Okay to return at d/c per Marchelle Folks. PT and OT assessed pt today and do not recommend any follow up at this time. Pt possibly ready for d/c tomorrow. Facility aware as well as family.   Assessment/plan status:  Psychosocial Support/Ongoing Assessment of Needs Other assessment/ plan:   Information/referral to community resources:   [employer]    PATIENT'S/FAMILY'S RESPONSE TO PLAN OF CARE: Pt unable to discuss plan of care. [name] reports positive feelings regarding return to [facility] when stable. CSW to continue to follow.       [name], [place] [phone]

## 2012-10-07 NOTE — Progress Notes (Signed)
INITIAL NUTRITION ASSESSMENT  DOCUMENTATION CODES Per approved criteria  -Severe malnutrition in the context of chronic illness   INTERVENTION:  Resource Breeze po TID, each supplement provides 250 kcal and 9 grams of protein.  ProStat 30 ml BID (each 30 ml provides 100 kcal, 15 gr protein)  Recommend assist with feeding   Cranberry juice daily with breakfast or meds  NUTRITION DIAGNOSIS: Inadequate oral intake related to decreased appetite as evidenced by 0% ,meal intake and 31# wt loss over past 9 months.   Goal: Pt to meet >/= 90% of their estimated nutrition needs  Monitor:  Po intake, labs and wt trends  Reason for Assessment: Malnutrition Screen Score = 4  79 y.o. female  Admitting c/o: weakness   ASSESSMENT: Pt from NH. Variable recent weight hx. However, overall during the past 9 months Ms Penaflor has experienced 31#, 19% wt decrease since last September  (9 months ago) which is significant. Hx includes chronic UTI's which may likely be contributing to decreased appetite and dementia disease progression may also be a factor. There is also mild to moderate inflammatory response with Dementia.   She is receiving  Megace for appetite. She is unable to provide hx due to advanced dementia. Her meal intake this morning was very poor per nursing that is no food intake only 1/2 carton of milk consumed. Pt does tell me that she doesn't drink Ensure. Will offer her Resource Breeze and protein modular to support improved nutrition intake and follow for her acceptance of these. Also a trial of finger foods with each meal may also be helpful to increase po's.  She has severe muscle wasting (thigh, calf, and knee), and wt loss of 19% in < 1 year.  Height: Ht Readings from Last 1 Encounters:  10/06/12 5\' 3"  (1.6 m)    Weight: Wt Readings from Last 1 Encounters:  10/06/12 130 lb 12.8 oz (59.33 kg)    Ideal Body Weight: 115# (52.2 kg)  % Ideal Body Weight: 114%  Wt Readings  from Last 10 Encounters:  10/06/12 130 lb 12.8 oz (59.33 kg)  08/04/12 135 lb (61.236 kg)  07/29/12 120 lb (54.432 kg)  07/03/12 127 lb (57.607 kg)  03/18/12 145 lb (65.772 kg)  01/28/12 161 lb 9.6 oz (73.3 kg)  01/19/12 162 lb 3.2 oz (73.573 kg)  01/19/12 162 lb 3.2 oz (73.573 kg)  01/18/12 164 lb 6.4 oz (74.571 kg)  12/30/11 164 lb 12.8 oz (74.753 kg)    Usual Body Weight: 162-165#  % Usual Body Weight: 81%  BMI:  Body mass index is 23.18 kg/(m^2).normal range  Estimated Nutritional Needs: Kcal: 1475-1770 Protein: 65-77 gr Fluid:1800 ml/day   Skin: Stage I to coccyx and old bruise to right leg  Diet Order: Carb Control-po 0%   EDUCATION NEEDS: -Education not appropriate at this time   Intake/Output Summary (Last 24 hours) at 10/07/12 1006 Last data filed at 10/07/12 0944  Gross per 24 hour  Intake    115 ml  Output    180 ml  Net    -65 ml    Last BM: 10/07/12  Labs:   Recent Labs Lab 10/06/12 1859 10/07/12 0148 10/07/12 0714  NA 141  --  139  K 2.9*  --  3.6  CL 102  --  103  CO2 25  --  25  BUN 19  --  15  CREATININE 0.69  --  0.58  CALCIUM 10.0  --  9.7  MG  --  1.4*  --   GLUCOSE 56*  --  84    CBG (last 3)   Recent Labs  10/07/12 0742  GLUCAP 81    Scheduled Meds: . sodium chloride   Intravenous STAT  . antiseptic oral rinse  15 mL Mouth Rinse BID  . cefTRIAXone (ROCEPHIN)  IV  1 g Intravenous Q24H  . colchicine  0.6 mg Oral Daily  . insulin aspart  0-5 Units Subcutaneous QHS  . insulin aspart  0-9 Units Subcutaneous TID WC  . irbesartan  75 mg Oral Daily  . magnesium sulfate 1 - 4 g bolus IVPB  2 g Intravenous Once  . megestrol  400 mg Oral q morning - 10a  . metoprolol tartrate  12.5 mg Oral BID  . pantoprazole  40 mg Oral Daily  . polyethylene glycol  17 g Oral Daily  . QUEtiapine  12.5 mg Oral Daily  . Rivaroxaban  20 mg Oral Daily  . sodium chloride  3 mL Intravenous Q12H    Continuous Infusions:   Past Medical  History  Diagnosis Date  . HTN (hypertension)   . Coronary atherosclerosis of unspecified type of vessel, native or graft     a. remote CABG in 1999 with Dr. Laneta Simmers. b. s/p PCI with stent to LCX in 2009 (Platinum protocol study). c. NSTEMI 10/2011 s/p BMS to SVG->diagonal.  . Personal history of other diseases of digestive system   . Gout, unspecified     "have had it in my hands and my feet"  . Unspecified hereditary and idiopathic peripheral neuropathy   . Esophageal reflux   . Anemia   . GI bleed   . Leukocytosis 03/16/2011  . Hyperlipidemia     Statin intolerant  . Pancreas disorder     prior pancreatectomy in 2011 complicated by leak requiring prolong and repeated drainage and subsequent pancreatic stent  . Other chronic pulmonary heart diseases   . Type I (juvenile type) diabetes mellitus without mention of complication, not stated as uncontrolled     Previously on insulin pump  . Permanent atrial fibrillation   . Anginal pain 11/24/11    "1st time"  . Exertional dyspnea 11/24/11  . Depression   . 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 CLL (chronic lymphocytic leukemia)     seeing Dr. [name]  . Myocardial infarction 1999    "on the verge of one"  . NSTEMI (non-ST elevated myocardial infarction) 10/2011    /H&P  . Stroke   . Atrial fibrillation   . Depression   . Acid reflux   . Dementia     Past Surgical History  Procedure Laterality Date  . Oophorectomy  1970's    w hysterectomy and bladder tacking procedure  . Belpharoptosis repair      right upper eyelid  . Esophagogastroduodenoscopy  07/11/2010  . Colonoscopy  10/20/06  . Tonsillectomy  1947  . Appendectomy  1970's?  . Abdominal hysterectomy  1970's  . Coronary angioplasty with stent placement  10/2011    "makes total of 4"; 20-30% ostial left main stenosis; occluded ostial LAD supplied by IMA graft, 20-30% scattered LCx stenoses with widely patent stent and 20% narrowing; diffuse RCA  luminal irregularities up to 20-30%; SVG-diagonal patent, but proximal 90% stenosis s/p BMS; patent LIMA-LAD; LVEF 55-65%.  . Coronary angioplasty with stent placement  2009    LCX  . Cataract extraction w/ intraocular lens  implant, bilateral  ~ 2003  . Coronary artery  bypass graft  1999    LIMA to LAD, SVG to the DX  . Pancreatectomy  2011    c/b leak requiring multiple drainage attempts and stent placement), /H&P  . Cardiac surgery    . Colonoscopy  01/20/2012    Procedure: COLONOSCOPY;  Surgeon: Malissa Hippo, MD;  Location: [facility];  Service: Endoscopy;  Laterality: N/A;    Royann Shivers MS,RD,LDN,CSG Office: [phone] Pager: [phone]

## 2012-10-07 NOTE — Progress Notes (Addendum)
[facility] PROGRESS NOTE  [name] JXB:[id number] DOB: [birth year] DOA: 10/06/2012 PCP: Amanda Pizza, MD  Assessment/Plan: 1. Possible acute encephalopathy: Upon further discussion with family it appears the patient is now at baseline. While the differential is broad, given further history obtained I suspect the etiology is UTI with possible adverse drug reaction to Seroquel. Continue to monitor, treating infection and decrease frequency of Seroquel. No evidence of seizure activity. Included in the differential would be new stroke, seizure. However there is no objective evidence of this at this point, I discussed these possibilities with the family and they desire conservative approach, specifically no further diagnostic tests or investigation. 2. Chronic right upper extremity weakness: Per family this is a chronic issue as his tremors. No further investigation is suggested medically or desired by the family. 3. Elevated troponin: Elevated troponin: Seen 10/2011 (3.38-3.65), 11/2011 (3.60-3.96),01/2012 (3.62-3.83). Elevation of troponin is chronic in nature, previous comments from cardiology has suggested that this is not reflective of acute cardiac event which I concur with. Discussed with family, no further investigation for that. Patient is not an interventional candidate regardless. Optimize medical therapy. 4. UTI: Continue empiric Rocephin. Followup culture. 5. Permanent atrial fibrillation: Rate controlled. Continue Xarelto. 6. CABG 1999, Coronary artery disease: Plan as above. Continue metoprolol 7. Diabetes mellitus type I: Sliding scale insulin. Lantus. 8. CLL: Appears stable. 9. History pancreatic neuroendocrine malignancy 10. Dementia: needs assistance with all ADLs (intermittently independent with eating). Wheelchair bound. Regular diet and thin liquids by history. 11. Severe malnutrition in context of chronic illness: Resource Breeze po TID, each supplement provides 250 kcal and 9  grams of protein. ProStat 30 ml BID (each 30 ml provides 100 kcal, 15 gr protein)    Replete magnesium   Followup urine culture, continue empiric antibiotics.  Monitor on telemetry, anticipate transfer 6/28  Discussed with the daughter-in-law [name] by telephone who is the main point of contact for medical decisions. She reports that the patient has dementia and does not usually carry on a conversation. She reports the patient has chronic right upper extremity weakness and tremors which is baseline for her. We discussed all laboratory studies and investigations up to this point and the broad differential. She desires a conservative approach but no further testing.   Change to inpatient  Code Status: DNR/DNI DVT prophylaxis: on Xarelto Family Communication: as above Disposition Plan: likely return to assisted living  Amanda Sacks, MD  [facility]  Pager [phone] If 7PM-7AM, please contact night-coverage at [email or web address], password [email or web address] 10/07/2012, 9:01 AM  LOS: 1 day   Clinical Summary: 79 year old woman with history of dementia who presented to the emergency department with history of altered mental status and right upper extremity weakness and twitching. Chronicity of symptoms unclear. Initial evaluation was notable for markedly elevated troponin and urinary tract infection. Family desired conservative measures and the patient was admitted for further evaluation and treatment.   Consultants:    Procedures:    Antibiotics:  Ceftriaxone 6/26 >>  HPI/Subjective: Afebrile, vital signs stable. Appears calm and comfortable. She answers simple questions but cannot provide history. She denies pain, shortness of breath, nausea, vomiting, abdominal.  Objective: Filed Vitals:   10/06/12 2300 10/06/12 2358 10/07/12 0000 10/07/12 0525  BP: 147/82 155/81  151/82  Pulse: 74 71  77  Temp:  98.6 F (37 C)  98.6 F (37 C)  TempSrc:  Oral  Oral  Resp: 21 18  18   Height:   5\' 3"  (1.6 m)  Weight:  59.33 kg (130 lb 12.8 oz)    SpO2: 100% 100% 100% 99%    Intake/Output Summary (Last 24 hours) at 10/07/12 0901 Last data filed at 10/07/12 0700  Gross per 24 hour  Intake      0 ml  Output    180 ml  Net   -180 ml     Filed Weights   10/06/12 1805 10/06/12 2358  Weight: 61.236 kg (135 lb) 59.33 kg (130 lb 12.8 oz)    Exam:   General: Appears calm and comfortable.  Eyes pupils equal, round, reactive to light. Normal lids, irises.  ENT grossly normal hearing. Lips and tongue appear unremarkable.  Cardiovascular irregular rhythm, normal rate. 2/6 systolic murmur. No rub or gallop. No lower extremity edema.  Respiratory clear to auscultation bilaterally. No wheezes, rales, rhonchi. Normal respiratory effort.  Abdomen soft, nontender, nondistended  Skin appears normal without rash or induration  Musculoskeletal moves bilateral lower extremities to command. Feet are warm and dry. Tone and strength appears grossly normal and symmetric in the lower extremities. Able to lift both upper extremities to command. Right side does appear to be weaker than left, 4/5 versus 5/5. Patient unable to fully comply with examination. Could not test past-pointing or pronator drift.  Neurologic as above. Cranial nerves appear grossly intact. Speech is fluent, clear but not appropriate.  Data Reviewed:  Capillary blood sugars labile, no hypoglycemia.  Basic metabolic panel unremarkable. Magnesium slightly low 1.4. Hepatic function panel unremarkable.   CT head negative. Chest x-ray atelectasis.  EKG Atrial fibrillation, lateral T wave inversion, ST changes, consider ischemia.  Pending studies:   Hemoglobin A1c  Urine culture  Scheduled Meds: . sodium chloride   Intravenous STAT  . antiseptic oral rinse  15 mL Mouth Rinse BID  . cefTRIAXone (ROCEPHIN)  IV  1 g Intravenous Q24H  . colchicine  0.6 mg Oral Daily  . insulin aspart  0-5 Units Subcutaneous QHS   . insulin aspart  0-9 Units Subcutaneous TID WC  . irbesartan  75 mg Oral Daily  . magnesium sulfate 1 - 4 g bolus IVPB  2 g Intravenous Once  . megestrol  400 mg Oral q morning - 10a  . metoprolol tartrate  12.5 mg Oral BID  . pantoprazole  40 mg Oral Daily  . polyethylene glycol  17 g Oral Daily  . QUEtiapine  12.5 mg Oral Daily  . Rivaroxaban  20 mg Oral Daily  . sodium chloride  3 mL Intravenous Q12H   Continuous Infusions:   Active Problems:   Non-ST elevation myocardial infarction (NSTEMI), initial episode of care   UTI (urinary tract infection)   Encephalopathy acute   UTI (lower urinary tract infection)   Dementia   Time spent 30 minutes

## 2012-10-07 NOTE — Care Management Note (Unsigned)
    Page 1 of 1   10/07/2012     10:45:14 AM   CARE MANAGEMENT NOTE 10/07/2012  Patient:  [name], [name]   Account Number:  [id number]  Date Initiated:  10/07/2012  Documentation initiated by:  Sharrie Rothman  Subjective/Objective Assessment:   Pt admitted from [facility] with UTI. Pt will return to facility when medically stable. Pt has a wheelchair at the facility and requires alot of assistance with ADL's.     Action/Plan:   CSw to arrange discharge to facility when medically stable.   Anticipated DC Date:  10/10/2012   Anticipated DC Plan:  ASSISTED LIVING / REST HOME  In-house referral  Clinical Social Worker      DC Planning Services  CM consult      Choice offered to / List presented to:             Status of service:  Completed, signed off Medicare Important Message given?   (If response is "NO", the following Medicare IM given date fields will be blank) Date Medicare IM given:   Date Additional Medicare IM given:    Discharge Disposition:    Per UR Regulation:    If discussed at Long Length of Stay Meetings, dates discussed:    Comments:  10/07/12 1045 Arlyss Queen, RN BSN CM

## 2012-10-07 NOTE — Progress Notes (Addendum)
CRITICAL VALUE ALERT  Critical value received:  Troponin 2.34  Date of notification:  10/07/2012  Time of notification:  03:22  Critical value read back: yes  Nurse who received alert:  Retta Diones  MD notified (1st page):  MD aware Dr. Orvan Falconer  Time of first page:  0340  MD notified (2nd page)  Time of second page:  Responding MD:    Time MD responded: [phone]

## 2012-10-07 NOTE — Evaluation (Signed)
Occupational Therapy Evaluation Patient Details Name: [name] MRN: [id number] DOB: 06/05/1929 Today's Date: 10/07/2012 Time: 1308-6578 OT Time Calculation (min): 12 min  OT Assessment / Plan / Recommendation History of present illness [name] is a 79 y.o. female.   Elderly nursing home resident with multiple medical problems including CLL, permanent atrial fibrillation on chronic anti-coagulation,and dementia, was reportedly felt to be weak on the right side today with increased lethargy   Clinical Impression   Patient seen by OT and PT this date at baseline. Patient resides at [facility], receives [employer] for BADL, and is primarily w/c bound. Patient has advanced dementia. Patient is unable to follow one step commands and is unable to provide accurate background information. Patient is not appropriate for therapy due to cognitive deficits. Recommend patient return to [facility] and continue to receive the amount of care she was receiving PTA.     OT Assessment  Patient does not need any further OT services    Follow Up Recommendations  No OT follow up       Equipment Recommendations  None recommended by OT          Precautions / Restrictions Precautions Precautions: Fall   Pertinent Vitals/Pain FACES = 0    ADL  Toilet Transfer: Performed;+1 Total assistance Toilet Transfer Method: Stand pivot Toilet Transfer Equipment:  (to recliner) Transfers/Ambulation Related to ADLs: Total Assist transfer to recliner. 2 person assist to position properly in recliner.       Visit Information  Last OT Received On: 10/07/12 Assistance Needed: +2 PT/OT Co-Evaluation/Treatment: Yes History of Present Illness: [name] is a 79 y.o. female.   Elderly nursing home resident with multiple medical problems including CLL, permanent atrial fibrillation on chronic anti-coagulation,and dementia, was reportedly felt to be weak on the right side today with increased  lethargy       Prior Functioning     Home Living Family/patient expects to be discharged to:: Skilled nursing facility Prior Function Level of Independence: Needs assistance Gait / Transfers Assistance Needed: Patient is primarily w/c dependent.  ADL's / Homemaking Assistance Needed: Total Assist for all BADL. Communication / Swallowing Assistance Needed: Communication did not make much sense due to advanced dementia. Comments: Was receiving in house PT through [facility].  Communication Communication: Expressive difficulties (Due to adavanced dementia.) Dominant Hand:  (unknown)         Vision/Perception Vision - History Baseline Vision: Other (comment) (Pt was able to see clock on wall. )   Cognition  Cognition Arousal/Alertness: Awake/alert Behavior During Therapy: Agitated (pleasant at beginning of session. Became agitated at end.) Overall Cognitive Status: History of cognitive impairments - at baseline Memory: Decreased short-term memory    Extremity/Trunk Assessment Upper Extremity Assessment Upper Extremity Assessment: Generalized weakness;RUE deficits/detail;LUE deficits/detail;Difficult to assess due to impaired cognition RUE Deficits / Details: Patient unable to raise RUE to shoulder level. PROM was [facility]. Patient had difficulty following one step commands. LUE Deficits / Details: Patient unable to raise LUE to shoulder level. PROM was [facility]. Patient had difficulty following one step commands. Lower Extremity Assessment Lower Extremity Assessment: Defer to PT evaluation     Mobility Bed Mobility Bed Mobility: Supine to Sit;Sitting - Scoot to Edge of Bed Supine to Sit: 1: +1 Total assist Scooting to HOB: 1: +1 Total assist Transfers Transfers: Sit to Stand;Stand to Sit Sit to Stand: 1: +1 Total assist;Without upper extremity assist;From bed Stand to Sit: 1: +1 Total assist;Without upper  extremity assist;To chair/3-in-1           End of Session OT - End  of Session Equipment Utilized During Treatment: Gait belt Activity Tolerance: Treatment limited secondary to agitation Patient left: in chair;with call bell/phone within reach;with chair alarm set  GO Functional Assessment Tool Used: observation Functional Limitation: Self care Self Care Current Status (W9604): 100 percent impaired, limited or restricted Self Care Discharge Status [phone]): 100 percent impaired, limited or restricted   [name], OTR/L,CBIS   10/07/2012, 10:13 AM

## 2012-10-08 DIAGNOSIS — E41 Nutritional marasmus: Secondary | ICD-10-CM

## 2012-10-08 DIAGNOSIS — E43 Unspecified severe protein-calorie malnutrition: Secondary | ICD-10-CM

## 2012-10-08 DIAGNOSIS — R7989 Other specified abnormal findings of blood chemistry: Secondary | ICD-10-CM

## 2012-10-08 LAB — GLUCOSE, CAPILLARY

## 2012-10-08 MED ORDER — CEFUROXIME AXETIL 250 MG PO TABS: 500.0000 mg | ORAL_TABLET | Freq: Two times a day (BID) | ORAL | Status: DC

## 2012-10-08 MED ORDER — QUETIAPINE FUMARATE 25 MG PO TABS: 12.5000 mg | ORAL_TABLET | Freq: Every day | ORAL | Status: DC

## 2012-10-08 MED ORDER — CEFUROXIME AXETIL 500 MG PO TABS: 500.0000 mg | ORAL_TABLET | Freq: Two times a day (BID) | ORAL | Status: DC

## 2012-10-08 MED ORDER — LORAZEPAM 0.5 MG PO TABS: 0.5000 mg | ORAL_TABLET | Freq: Two times a day (BID) | ORAL | Status: DC | PRN

## 2012-10-08 NOTE — Plan of Care (Signed)
Problem: Discharge Progression Outcomes Goal: Activity appropriate for discharge plan Outcome: Completed/Met Date Met:  10/08/12 At baseliine Goal: Other Discharge Outcomes/Goals Outcome: Completed/Met Date Met:  10/08/12 Discharged to [place] [facility] via family own transportation

## 2012-10-08 NOTE — Progress Notes (Signed)
[facility] PROGRESS NOTE  [name] ZOX:[id number] DOB: 1930/02/02 DOA: 10/06/2012 PCP: Catalina Pizza, MD  Assessment/Plan: 1. Possible acute encephalopathy: Upon further discussion with family it appears the patient is now at baseline. While the differential is broad, given further history obtained I suspect the etiology is UTI with possible adverse drug reaction to Seroquel. Continue to monitor, treating infection and decrease frequency of Seroquel. No evidence of seizure activity. Included in the differential would be new stroke, seizure. However there is no objective evidence of this at this point, I discussed these possibilities with the family and they desire conservative approach, specifically no further diagnostic tests or investigation. 2. Chronic right upper extremity weakness: Per family this is a chronic issue as his tremors. No further investigation is suggested medically or desired by the family. 3. Elevated troponin: Elevated troponin: Seen 10/2011 (3.38-3.65), 11/2011 (3.60-3.96),01/2012 (3.62-3.83). Elevation of troponin is chronic in nature, previous comments from cardiology has suggested that this is not reflective of acute cardiac event which I concur with. Discussed with family, no further investigation for that. Patient is not an interventional candidate regardless. Optimize medical therapy. 4. UTI: Continue empiric Rocephin. Followup culture. 5. Permanent atrial fibrillation: Rate controlled. Continue Xarelto. 6. CABG 1999, Coronary artery disease: Plan as above. Continue metoprolol 7. Diabetes mellitus: Sliding scale insulin. Lantus. 8. CLL: Appears stable. 9. History pancreatic neuroendocrine malignancy 10. Dementia: needs assistance with all ADLs (intermittently independent with eating). Wheelchair bound. Regular diet and thin liquids by history. 11. Severe malnutrition in context of chronic illness: Resource Breeze po TID, each supplement provides 250 kcal and 9 grams  of protein. ProStat 30 ml BID (each 30 ml provides 100 kcal, 15 gr protein)    Replete magnesium   Followup urine culture, continue empiric antibiotics.  Monitor on telemetry, anticipate transfer 6/28  Discussed with the daughter-in-law Sheral Pfahler by telephone who is the main point of contact for medical decisions. She reports that the patient has dementia and does not usually carry on a conversation. She reports the patient has chronic right upper extremity weakness and tremors which is baseline for her. We discussed all laboratory studies and investigations up to this point and the broad differential. She desires a conservative approach but no further testing.   Change to inpatient  Code Status: DNR/DNI DVT prophylaxis: on Xarelto Family Communication: as above Disposition Plan: likely return to assisted living  Brendia Sacks, MD  [facility]  Pager [phone] If 7PM-7AM, please contact night-coverage at [email or web address], password [facility] 10/08/2012, 10:28 AM  LOS: 2 days   Clinical Summary: 79 year old woman with history of dementia who presented to the emergency department with history of altered mental status and right upper extremity weakness and twitching. Chronicity of symptoms unclear. Initial evaluation was notable for markedly elevated troponin and urinary tract infection. Family desired conservative measures and the patient was admitted for further evaluation and treatment.   Consultants:  None   Procedures:  None  Antibiotics:  Ceftriaxone 6/26 >> 6/27  Cefuroxime 6/28 >> 7/2  HPI/Subjective: Discussed with RN--no issues overnight. Patient denies pain.  Objective: Filed Vitals:   10/07/12 1934 10/07/12 2050 10/08/12 0210 10/08/12 0422  BP:  151/80 150/81 147/72  Pulse: 97 89 79 75  Temp:  98.6 F (37 C) 98.9 F (37.2 C) 98.7 F (37.1 C)  TempSrc:  Oral Oral Oral  Resp: 16 16 16 16   Height:      Weight:    58.106 kg (128 lb 1.6 oz)  SpO2:  97% 94%  95% 96%    Intake/Output Summary (Last 24 hours) at 10/08/12 1028 Last data filed at 10/08/12 0500  Gross per 24 hour  Intake 1400.83 ml  Output   1000 ml  Net 400.83 ml     Filed Weights   10/06/12 1805 10/06/12 2358 10/08/12 0422  Weight: 61.236 kg (135 lb) 59.33 kg (130 lb 12.8 oz) 58.106 kg (128 lb 1.6 oz)    Exam:   General: Appears calm and comfortable. Lying in bed.  Cardiovascular irregular rhythm, normal rate. No murmur, rub, gallop. No lower extremity edema.  Respiratory clear to auscultation bilaterally. No wheezes, rales, rhonchi. Normal respiratory effort  Abdomen soft, nontender, nondistended  Musculoskeletal grossly normal tone and strength bilateral upper and lower extremities. Feet are warm and dry. She moves all extremities to command symmetrically. Resting tremor noted bilateral upper extremities.  Psychiatric: confused.  Neurologic: As above. Face asymmetric. Cranial nerves appear grossly intact.  Data Reviewed:  Capillary blood sugars stable. No hypoglycemia.  Hemoglobin A1c 7.5.  Pending studies:   Urine culture--Proteus mirabilis, sensitivities pending  Scheduled Meds: . antiseptic oral rinse  15 mL Mouth Rinse BID  . cefTRIAXone (ROCEPHIN)  IV  1 g Intravenous Q24H  . colchicine  0.6 mg Oral Daily  . feeding supplement  30 mL Oral TID WC  . feeding supplement  1 Container Oral TID BM  . insulin aspart  0-5 Units Subcutaneous QHS  . insulin aspart  0-9 Units Subcutaneous TID WC  . irbesartan  75 mg Oral Daily  . megestrol  400 mg Oral q morning - 10a  . metoprolol tartrate  12.5 mg Oral BID  . pantoprazole  40 mg Oral Daily  . polyethylene glycol  17 g Oral Daily  . QUEtiapine  12.5 mg Oral QHS  . Rivaroxaban  20 mg Oral Daily  . sodium chloride  3 mL Intravenous Q12H   Continuous Infusions:   Principal Problem:   Encephalopathy acute Active Problems:   Elevated troponin   UTI (urinary tract infection)   UTI (lower urinary  tract infection)   Dementia   Severe malnutrition

## 2012-10-08 NOTE — Discharge Summary (Addendum)
Physician Discharge Summary  [name] ZOX:[id number] DOB: [birth year] DOA: 10/06/2012  PCP: Catalina Pizza, MD  Admit date: 10/06/2012 Discharge date: 10/08/2012  Addendum 6/29: Final urine culture results available Proteus mirabilis sensitive to cefazolin and ceftriaxone.  Recommendations for Outpatient Follow-up:  1. Followup resolution of UTI.  2. Followup urine culture. Proteus mirabilis--sensitivities pending  Follow-up Information   Follow up with Catalina Pizza, MD In 2 weeks.   [place] [address]      Discharge Diagnoses:  1. Acute encephalopathy 2. UTI 3. Elevated troponin 4. Chronic right upper extremity weakness 5. Atrial fibrillation 6. Severe malnutrition in context of chronic illness 7. Dementia  Discharge Condition: Improved Disposition: Return to ALF [facility] [place]  Diet recommendation: Regular diet  Filed Weights   10/06/12 1805 10/06/12 2358 10/08/12 0422  Weight: 61.236 kg (135 lb) 59.33 kg (130 lb 12.8 oz) 58.106 kg (128 lb 1.6 oz)    History of present illness:  79 year old woman with history of dementia who presented to the emergency department with history of altered mental status and right upper extremity weakness and twitching. Chronicity of symptoms unclear. Initial evaluation was notable for markedly elevated troponin and urinary tract infection. Family desired conservative measures and the patient was admitted for further evaluation and treatment.   Hospital Course:  [name] was admitted to the medical floor and treated empirically with Rocephin for UTI. Acute encephalopathy resolved rapidly and per discussion with family patient is mentally at baseline. On further discussion with family, right upper extremity weakness is chronic as are intermittent tremors, especially of the right upper extremity. Elevated troponin was seen but there were no signs or symptoms to suggest ACS. Further discussion as below. As the  patient has returned to baseline she is stable for discharge and will complete antibiotics in the outpatient setting. Individual issues as below.  1. Acute encephalopathy: Per family the patient is now at baseline. While the differential is broad, given further history obtained I suspect the etiology is UTI with possible adverse drug reaction to Seroquel. Continue to treat UTI and decrease frequency of Seroquel (Seroquel had recently been increased to twice daily). No evidence of seizure activity. Included in the differential would be new stroke, seizure. However there is no objective evidence of this at this point, I discussed these possibilities with the family and they desire conservative approach, specifically no further diagnostic tests or investigation. 2. Chronic right upper extremity weakness: Per family this is a chronic issue as is tremors. No further investigation is suggested medically or desired by the family. 3. Elevated troponin: Elevated troponin: Seen 10/2011 (3.38-3.65), 11/2011 (3.60-3.96),01/2012 (3.62-3.83). Elevation of troponin is chronic in nature, previous comments from cardiology suggested that this is not reflective of an acute cardiac event, which I concur with. Discussed with family, no further investigation. Patient is not an interventional candidate regardless. Continue beta blocker therapy, ARB. Not on aspirin secondary to history of GI bleed and patient already on Xarelto. Statin therapy could be considered, however family desires a conservative approach given the patient's advanced dementia it is reasonable not to institute therapy at this point. 4. UTI: Continue empiric antibiotics. Followup culture. 5. Permanent atrial fibrillation: Rate controlled. Continue Xarelto. 6. CABG 1999, Coronary artery disease: Plan as above. Continue metoprolol 7. Diabetes mellitus: Sliding scale insulin. Lantus. 8. CLL: Appears stable. 9. History pancreatic neuroendocrine  malignancy 10. Dementia: needs assistance with all ADLs (intermittently independent with eating). Wheelchair bound. Regular diet and thin  liquids by history. 11. Severe malnutrition in context of chronic illness: Resource Breeze po TID, each supplement provides 250 kcal and 9 grams of protein. ProStat 30 ml BID (each 30 ml provides 100 kcal, 15 gr protein)  Consultants:  None  Procedures:  None Antibiotics:  Ceftriaxone 6/26 >> 6/27  Cefuroxime 6/28 >> 7/2  Discharge Instructions  Discharge Orders   Future Orders Complete By Expires     Diet general  As directed     Discharge instructions  As directed     Comments:      Complete antibiotic for urinary tract infection. Followup urine culture. Call physician or seek immediate medical attention for fever, lethargy or worsening of condition.        Medication List         acetaminophen 325 MG tablet  Commonly known as:  TYLENOL  Take 650 mg by mouth 2 (two) times daily.     cefUROXime 500 MG tablet  Commonly known as:  CEFTIN  Take 1 tablet (500 mg total) by mouth 2 (two) times daily with a meal. Start evening 6/28.     Cholecalciferol 4000 UNITS Caps  Take 4,000 Units by mouth daily.     colchicine 0.6 MG tablet  Take 0.6 mg by mouth daily as needed. Gout Flare     DAILY VITE PO  Take 1 tablet by mouth daily.     docusate sodium 100 MG capsule  Commonly known as:  COLACE  Take 100 mg by mouth daily.     feeding supplement Liqd  Take 30 mLs by mouth daily.     insulin aspart 100 UNIT/ML injection  Commonly known as:  novoLOG  Inject 5-15 Units into the skin 3 (three) times daily before meals. Patient takes 5 units before breakfast and 15 units before lunch and dinner     insulin glargine 100 UNIT/ML injection  Commonly known as:  LANTUS  Inject 45 Units into the skin at bedtime.     LORazepam 0.5 MG tablet  Commonly known as:  ATIVAN  Take 1 tablet (0.5 mg total) by mouth 2 (two) times daily as needed for  anxiety (agitation).     magnesium oxide 400 MG tablet  Commonly known as:  MAG-OX  Take 400 mg by mouth 3 (three) times daily.     megestrol 40 MG/ML suspension  Commonly known as:  MEGACE  Take 400 mg by mouth every morning.     metoprolol tartrate 25 MG tablet  Commonly known as:  LOPRESSOR  Take 12.5 mg by mouth 2 (two) times daily.     nitroGLYCERIN 0.4 MG SL tablet  Commonly known as:  NITROSTAT  Place 1 tablet (0.4 mg total) under the tongue every 5 (five) minutes x 3 doses as needed for chest pain.     olmesartan 40 MG tablet  Commonly known as:  BENICAR  Take 40 mg by mouth daily.     omeprazole 20 MG capsule  Commonly known as:  PRILOSEC  Take 20 mg by mouth daily.     ondansetron 4 MG tablet  Commonly known as:  ZOFRAN  Take 1 tablet (4 mg total) by mouth every 8 (eight) hours as needed for nausea.     polyethylene glycol packet  Commonly known as:  MIRALAX / GLYCOLAX  Take 17 g by mouth daily.     QUEtiapine 25 MG tablet  Commonly known as:  SEROQUEL  Take 0.5 tablets (12.5 mg total) by mouth  at bedtime.     vitamin C 500 MG tablet  Commonly known as:  ASCORBIC ACID  Take 500 mg by mouth 2 (two) times daily.     XARELTO 20 MG Tabs  Generic drug:  Rivaroxaban  Take 20 mg by mouth daily.       Allergies  Allergen Reactions  . Codeine Nausea And Vomiting  . Ivp Dye (Iodinated Diagnostic Agents) Other (See Comments)    Burst veins in her arm.   . Morphine Other (See Comments)    Not in Right State of Mind.   . Prednisone Other (See Comments)    Patient became delirious/combative when given 60mg  dose  . Statins Other (See Comments)    Leg Cramps.     The results of significant diagnostics from this hospitalization (including imaging, microbiology, ancillary and laboratory) are listed below for reference.    Significant Diagnostic Studies: Ct Head Wo Contrast  10/06/2012   *RADIOLOGY REPORT*  Clinical Data: Severe headaches  CT HEAD WITHOUT  CONTRAST  Technique:  Contiguous axial images were obtained from the base of the skull through the vertex without contrast.  Comparison: 08/19/2012  Findings: Moderate atrophy.  Chronic infarct right frontal lobe unchanged.  Chronic microvascular ischemia in the white matter. Chronic right PICA infarct unchanged.  Negative for acute infarct, hemorrhage, or mass lesion.  IMPRESSION: Atrophy and chronic ischemia.  No acute abnormality.   Original Report Authenticated By: Janeece Riggers, M.D.   Dg Chest Port 1 View  10/07/2012   *RADIOLOGY REPORT*  Clinical Data: Mental status changes, atrial fibrillation.  PORTABLE CHEST - 1 VIEW  Comparison: 11/24/2011.  Findings: Patient is rotated.  Trachea is midline.  Heart is enlarged but stable.  There may be streaky atelectasis in the right suprahilar region.  Lungs are otherwise clear.  No pleural fluid.  IMPRESSION: Question streaky atelectasis in the right suprahilar region. Follow-up PA and lateral view of the chest are recommended in further evaluation, as clinically indicated.   Original Report Authenticated By: Leanna Battles, M.D.   No hypoxia or tachypnea. No signs or symptoms to suggest pneumonia. Therefore no repeat chest x-ray indicated.   Microbiology: Recent Results (from the past 240 hour(s))  URINE CULTURE     Status: None   Collection Time    10/06/12  7:55 PM      Result Value Range Status   Specimen Description URINE, CATHETERIZED   Final   Special Requests NONE   Final   Culture  Setup Time 10/06/2012 22:00   Final   Colony Count >=100,000 COLONIES/ML   Final   Culture PROTEUS MIRABILIS   Final   Report Status PENDING   Incomplete  MRSA PCR SCREENING     Status: None   Collection Time    10/07/12  1:00 AM      Result Value Range Status   MRSA by PCR NEGATIVE  NEGATIVE Final   Comment:            The GeneXpert MRSA Assay (FDA     approved for NASAL specimens     only), is one component of a     comprehensive MRSA colonization      surveillance program. It is not     intended to diagnose MRSA     infection nor to guide or     monitor treatment for     MRSA infections.     Labs: Basic Metabolic Panel:  Recent Labs Lab 10/06/12 1859 10/07/12 0148  10/07/12 0714  NA 141  --  139  K 2.9*  --  3.6  CL 102  --  103  CO2 25  --  25  GLUCOSE 56*  --  84  BUN 19  --  15  CREATININE 0.69  --  0.58  CALCIUM 10.0  --  9.7  MG  --  1.4*  --    Liver Function Tests:  Recent Labs Lab 10/06/12 1859 10/07/12 0148  AST 17 17  ALT 11 9  ALKPHOS 70 66  BILITOT 0.2* 0.3  PROT 7.3 6.7  ALBUMIN 3.1* 2.7*   CBC:  Recent Labs Lab 10/06/12 1859 10/07/12 0714  WBC 18.2* 15.5*  NEUTROABS 12.0*  --   HGB 12.4 11.8*  HCT 37.0 35.1*  MCV 93.4 93.4  PLT 579* 562*   Cardiac Enzymes:  Recent Labs Lab 10/06/12 1859 10/07/12 0148 10/07/12 0713  TROPONINI 2.47* 2.34* 2.30*   CBG:  Recent Labs Lab 10/07/12 0742 10/07/12 1137 10/07/12 1646 10/07/12 2022 10/08/12 0735  GLUCAP 81 94 111* 232* 160*    Principal Problem:   Encephalopathy acute Active Problems:   Elevated troponin   UTI (urinary tract infection)   UTI (lower urinary tract infection)   Dementia   Severe malnutrition   Time coordinating discharge: 25 minutes  Signed:  Brendia Sacks, MD [facility] 10/08/2012, 10:43 AM

## 2012-10-08 NOTE — Plan of Care (Signed)
Problem: Discharge Progression Outcomes Goal: Other Discharge Outcomes/Goals Outcome: Completed/Met Date Met:  10/08/12 Discharged to [facility] via family transportation  Report was called to [facility]

## 2012-10-08 NOTE — Progress Notes (Signed)
FL2 prepared and faxed, along with dc summary to pt's RN today.  RN called and arranged for pt's ALF to come and pick her up. [facility])

## 2012-10-09 LAB — URINE CULTURE

## 2012-10-18 ENCOUNTER — Emergency Department (HOSPITAL_COMMUNITY)
Admission: EM | Admit: 2012-10-18 | Discharge: 2012-10-18 | Disposition: A | Discharge status: Skilled Nursing Facility | Payer: Medicare Other | Attending: Emergency Medicine | Admitting: Emergency Medicine

## 2012-10-18 ENCOUNTER — Emergency Department (HOSPITAL_COMMUNITY): Payer: Medicare Other

## 2012-10-18 DIAGNOSIS — Z9889 Other specified postprocedural states: Secondary | ICD-10-CM | POA: Insufficient documentation

## 2012-10-18 DIAGNOSIS — K219 Gastro-esophageal reflux disease without esophagitis: Secondary | ICD-10-CM | POA: Insufficient documentation

## 2012-10-18 DIAGNOSIS — Z859 Personal history of malignant neoplasm, unspecified: Secondary | ICD-10-CM | POA: Insufficient documentation

## 2012-10-18 DIAGNOSIS — Z7901 Long term (current) use of anticoagulants: Secondary | ICD-10-CM | POA: Insufficient documentation

## 2012-10-18 DIAGNOSIS — I10 Essential (primary) hypertension: Secondary | ICD-10-CM | POA: Insufficient documentation

## 2012-10-18 DIAGNOSIS — Z951 Presence of aortocoronary bypass graft: Secondary | ICD-10-CM | POA: Insufficient documentation

## 2012-10-18 DIAGNOSIS — Z8639 Personal history of other endocrine, nutritional and metabolic disease: Secondary | ICD-10-CM | POA: Insufficient documentation

## 2012-10-18 DIAGNOSIS — Z862 Personal history of diseases of the blood and blood-forming organs and certain disorders involving the immune mechanism: Secondary | ICD-10-CM | POA: Insufficient documentation

## 2012-10-18 DIAGNOSIS — Z8679 Personal history of other diseases of the circulatory system: Secondary | ICD-10-CM | POA: Insufficient documentation

## 2012-10-18 DIAGNOSIS — Y939 Activity, unspecified: Secondary | ICD-10-CM | POA: Insufficient documentation

## 2012-10-18 DIAGNOSIS — Z8669 Personal history of other diseases of the nervous system and sense organs: Secondary | ICD-10-CM | POA: Insufficient documentation

## 2012-10-18 DIAGNOSIS — Z8719 Personal history of other diseases of the digestive system: Secondary | ICD-10-CM | POA: Insufficient documentation

## 2012-10-18 DIAGNOSIS — I2510 Atherosclerotic heart disease of native coronary artery without angina pectoris: Secondary | ICD-10-CM | POA: Insufficient documentation

## 2012-10-18 DIAGNOSIS — Z8709 Personal history of other diseases of the respiratory system: Secondary | ICD-10-CM | POA: Insufficient documentation

## 2012-10-18 DIAGNOSIS — IMO0002 Reserved for concepts with insufficient information to code with codable children: Secondary | ICD-10-CM | POA: Insufficient documentation

## 2012-10-18 DIAGNOSIS — F3289 Other specified depressive episodes: Secondary | ICD-10-CM | POA: Insufficient documentation

## 2012-10-18 DIAGNOSIS — Z79899 Other long term (current) drug therapy: Secondary | ICD-10-CM | POA: Insufficient documentation

## 2012-10-18 DIAGNOSIS — I4891 Unspecified atrial fibrillation: Secondary | ICD-10-CM | POA: Insufficient documentation

## 2012-10-18 DIAGNOSIS — S60511A Abrasion of right hand, initial encounter: Secondary | ICD-10-CM

## 2012-10-18 DIAGNOSIS — I252 Old myocardial infarction: Secondary | ICD-10-CM | POA: Insufficient documentation

## 2012-10-18 DIAGNOSIS — Z8673 Personal history of transient ischemic attack (TIA), and cerebral infarction without residual deficits: Secondary | ICD-10-CM | POA: Insufficient documentation

## 2012-10-18 DIAGNOSIS — W230XXA Caught, crushed, jammed, or pinched between moving objects, initial encounter: Secondary | ICD-10-CM | POA: Insufficient documentation

## 2012-10-18 DIAGNOSIS — F0390 Unspecified dementia without behavioral disturbance: Secondary | ICD-10-CM | POA: Insufficient documentation

## 2012-10-18 DIAGNOSIS — M109 Gout, unspecified: Secondary | ICD-10-CM | POA: Insufficient documentation

## 2012-10-18 DIAGNOSIS — F329 Major depressive disorder, single episode, unspecified: Secondary | ICD-10-CM | POA: Insufficient documentation

## 2012-10-18 DIAGNOSIS — Z794 Long term (current) use of insulin: Secondary | ICD-10-CM | POA: Insufficient documentation

## 2012-10-18 DIAGNOSIS — Z87898 Personal history of other specified conditions: Secondary | ICD-10-CM | POA: Insufficient documentation

## 2012-10-18 DIAGNOSIS — Z9861 Coronary angioplasty status: Secondary | ICD-10-CM | POA: Insufficient documentation

## 2012-10-18 DIAGNOSIS — Y9210 Unspecified residential institution as the place of occurrence of the external cause: Secondary | ICD-10-CM | POA: Insufficient documentation

## 2012-10-18 DIAGNOSIS — E109 Type 1 diabetes mellitus without complications: Secondary | ICD-10-CM | POA: Insufficient documentation

## 2012-10-18 NOTE — ED Notes (Signed)
The patient is from [facility].  Per EMS, the patient injured her right hand via her wheel chair and sustained a skin tear.

## 2012-10-18 NOTE — ED Notes (Addendum)
Pt placed within site of nursing station due to her history of dementia and combativeness with staff.  According to history provided at [facility], the patient may have gotten her hand stuck in her wheelchair wheels and potentially fractured her hand.

## 2012-10-18 NOTE — Discharge Instructions (Signed)
Remove the bandage, on the right hand , in 5-7 days. See her doctor, for checkup, in 3 or 4 days.   Abrasion An abrasion is a cut or scrape of the skin. Abrasions do not extend through all layers of the skin and most heal within 10 days. It is important to care for your abrasion properly to prevent infection. CAUSES  Most abrasions are caused by falling on, or gliding across, the ground or other surface. When your skin rubs on something, the outer and inner layer of skin rubs off, causing an abrasion. DIAGNOSIS  Your caregiver will be able to diagnose an abrasion during a physical exam.  TREATMENT  Your treatment depends on how large and deep the abrasion is. Generally, your abrasion will be cleaned with water and a mild soap to remove any dirt or debris. An antibiotic ointment may be put over the abrasion to prevent an infection. A bandage (dressing) may be wrapped around the abrasion to keep it from getting dirty.  You may need a tetanus shot if:  You cannot remember when you had your last tetanus shot.  You have never had a tetanus shot.  The injury broke your skin. If you get a tetanus shot, your arm may swell, get red, and feel warm to the touch. This is common and not a problem. If you need a tetanus shot and you choose not to have one, there is a rare chance of getting tetanus. Sickness from tetanus can be serious.  HOME CARE INSTRUCTIONS   If a dressing was applied, change it at least once a day or as directed by your caregiver. If the bandage sticks, soak it off with warm water.   Wash the area with water and a mild soap to remove all the ointment 2 times a day. Rinse off the soap and pat the area dry with a clean towel.   Reapply any ointment as directed by your caregiver. This will help prevent infection and keep the bandage from sticking. Use gauze over the wound and under the dressing to help keep the bandage from sticking.   Change your dressing right away if it becomes  wet or dirty.   Only take over-the-counter or prescription medicines for pain, discomfort, or fever as directed by your caregiver.   Follow up with your caregiver within 24 48 hours for a wound check, or as directed. If you were not given a wound-check appointment, look closely at your abrasion for redness, swelling, or pus. These are signs of infection. SEEK IMMEDIATE MEDICAL CARE IF:   You have increasing pain in the wound.   You have redness, swelling, or tenderness around the wound.   You have pus coming from the wound.   You have a fever or persistent symptoms for more than 2 3 days.  You have a fever and your symptoms suddenly get worse.  You have a bad smell coming from the wound or dressing.  MAKE SURE YOU:   Understand these instructions.  Will watch your condition.  Will get help right away if you are not doing well or get worse. Document Released: 01/07/2005 Document Revised: 03/16/2012 Document Reviewed: 03/03/2011 [facility] Patient Information 2014 [place], [place].

## 2012-10-18 NOTE — ED Notes (Signed)
The patient is non verbal currently.

## 2012-10-18 NOTE — ED Provider Notes (Signed)
History    CSN: [id number] Arrival date & time 10/18/12  1716  First MD Initiated Contact with Patient 10/18/12 1814     Chief Complaint  Patient presents with  . Hand Injury   (Consider location/radiation/quality/duration/timing/severity/associated sxs/prior Treatment) HPI Comments: [name] is a 79 y.o. Female who was at her nursing care facility today, when it was noticed that her right hand was injured. The staff there think that she might have an tangled up, "in her wheelchair". The patient cannot answer what happened.  Level V caveat-dementia  Patient is a 79 y.o. female presenting with hand injury. The history is provided by the patient and the nursing home.  Hand Injury  Past Medical History  Diagnosis Date  . HTN (hypertension)   . Coronary atherosclerosis of unspecified type of vessel, native or graft     a. remote CABG in 1999 with Dr. Laneta Simmers. b. s/p PCI with stent to LCX in 2009 (Platinum protocol study). c. NSTEMI 10/2011 s/p BMS to SVG->diagonal.  . Personal history of other diseases of digestive system   . Gout, unspecified     "have had it in my hands and my feet"  . Unspecified hereditary and idiopathic peripheral neuropathy   . Esophageal reflux   . Anemia   . GI bleed   . Leukocytosis 03/16/2011  . Hyperlipidemia     Statin intolerant  . Pancreas disorder     prior pancreatectomy in 2011 complicated by leak requiring prolong and repeated drainage and subsequent pancreatic stent  . Other chronic pulmonary heart diseases   . Type I (juvenile type) diabetes mellitus without mention of complication, not stated as uncontrolled     Previously on insulin pump  . Permanent atrial fibrillation   . Anginal pain 11/24/11    "1st time"  . Exertional dyspnea 11/24/11  . Depression   . 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 CLL (chronic lymphocytic leukemia)     seeing Dr. [name]  . Myocardial infarction 1999    "on the verge of one"   . NSTEMI (non-ST elevated myocardial infarction) 10/2011    /H&P  . Stroke   . Atrial fibrillation   . Depression   . Acid reflux   . Dementia    Past Surgical History  Procedure Laterality Date  . Oophorectomy  1970's    w hysterectomy and bladder tacking procedure  . Belpharoptosis repair      right upper eyelid  . Esophagogastroduodenoscopy  07/11/2010  . Colonoscopy  10/20/06  . Tonsillectomy  1947  . Appendectomy  1970's?  . Abdominal hysterectomy  1970's  . Coronary angioplasty with stent placement  10/2011    "makes total of 4"; 20-30% ostial left main stenosis; occluded ostial LAD supplied by IMA graft, 20-30% scattered LCx stenoses with widely patent stent and 20% narrowing; diffuse RCA luminal irregularities up to 20-30%; SVG-diagonal patent, but proximal 90% stenosis s/p BMS; patent LIMA-LAD; LVEF 55-65%.  . Coronary angioplasty with stent placement  2009    LCX  . Cataract extraction w/ intraocular lens  implant, bilateral  ~ 2003  . Coronary artery bypass graft  1999    LIMA to LAD, SVG to the DX  . Pancreatectomy  2011    c/b leak requiring multiple drainage attempts and stent placement), /H&P  . Cardiac surgery    . Colonoscopy  01/20/2012    Procedure: COLONOSCOPY;  Surgeon: Malissa Hippo, MD;  Location: [facility];  Service:  Endoscopy;  Laterality: N/A;   Family History  Problem Relation Age of Onset  . Stroke Mother   . Lung cancer Father   . Healthy Son    History  Substance Use Topics  . Smoking status: Never Smoker   . Smokeless tobacco: Never Used  . Alcohol Use: No   OB History   Grav Para Term Preterm Abortions TAB SAB Ect Mult Living                 Review of Systems  Unable to perform ROS   Allergies  Codeine; Ivp dye; Morphine; Prednisone; and Statins  Home Medications   Current Outpatient Rx  Name  Route  Sig  Dispense  Refill  . acetaminophen (TYLENOL) 325 MG tablet   Oral   Take 650 mg by mouth 2 (two) times daily.          . Cholecalciferol 4000 UNITS CAPS   Oral   Take 4,000 Units by mouth daily.         . colchicine 0.6 MG tablet   Oral   Take 0.6 mg by mouth daily. Gout Flare         . docusate sodium (COLACE) 100 MG capsule   Oral   Take 100 mg by mouth daily.          . insulin aspart (NOVOLOG) 100 UNIT/ML injection   Subcutaneous   Inject 5-15 Units into the skin 3 (three) times daily before meals. Patient takes 5 units before breakfast and 15 units before lunch and dinner         . insulin glargine (LANTUS) 100 UNIT/ML injection   Subcutaneous   Inject 45 Units into the skin at bedtime.          Marland Kitchen LORazepam (ATIVAN) 0.5 MG tablet   Oral   Take 1 tablet (0.5 mg total) by mouth 2 (two) times daily as needed for anxiety (agitation).   10 tablet   0   . magnesium oxide (MAG-OX) 400 MG tablet   Oral   Take 400 mg by mouth 3 (three) times daily.          . megestrol (MEGACE) 40 MG/ML suspension   Oral   Take 400 mg by mouth every morning.         . metoprolol tartrate (LOPRESSOR) 25 MG tablet   Oral   Take 12.5 mg by mouth 2 (two) times daily.          . Multiple Vitamin (DAILY VITE PO)   Oral   Take 1 tablet by mouth daily.         . nitrofurantoin, macrocrystal-monohydrate, (MACROBID) 100 MG capsule   Oral   Take 100 mg by mouth 2 (two) times daily.         Marland Kitchen olmesartan (BENICAR) 40 MG tablet   Oral   Take 40 mg by mouth daily.         Marland Kitchen omeprazole (PRILOSEC) 20 MG capsule   Oral   Take 20 mg by mouth daily.         . polyethylene glycol (MIRALAX / GLYCOLAX) packet   Oral   Take 17 g by mouth daily.         . QUEtiapine (SEROQUEL) 25 MG tablet   Oral   Take 0.5 tablets (12.5 mg total) by mouth at bedtime.         . Rivaroxaban (XARELTO) 20 MG TABS   Oral   Take 20  mg by mouth daily.         . traMADol (ULTRAM) 50 MG tablet   Oral   Take 50 mg by mouth 2 (two) times daily.         . vitamin C (ASCORBIC ACID) 500 MG tablet    Oral   Take 500 mg by mouth 2 (two) times daily.         . cefUROXime (CEFTIN) 500 MG tablet   Oral   Take 1 tablet (500 mg total) by mouth 2 (two) times daily with a meal. Start evening 6/28.   10 tablet   0   . nitroGLYCERIN (NITROSTAT) 0.4 MG SL tablet   Sublingual   Place 1 tablet (0.4 mg total) under the tongue every 5 (five) minutes x 3 doses as needed for chest pain.   25 tablet   4    BP 138/79  Pulse 94  Temp(Src) 98.6 F (37 C)  Resp 20  SpO2 97% Physical Exam  Nursing note and vitals reviewed. Constitutional: She appears well-developed.  Frail, elderly  HENT:  Head: Normocephalic and atraumatic.  Right Ear: External ear normal.  Left Ear: External ear normal.  Eyes: Conjunctivae and EOM are normal. Pupils are equal, round, and reactive to light.  Neck: Normal range of motion and phonation normal. Neck supple.  Cardiovascular: Normal rate, regular rhythm, normal heart sounds and intact distal pulses.   Pulmonary/Chest: Effort normal and breath sounds normal. She exhibits no bony tenderness.  Abdominal: Soft. Normal appearance. There is no tenderness.  Musculoskeletal: Normal range of motion. She exhibits no edema.  Right hand, dorsum, has a skin splitting laceration, that does not expose the subcutaneous tissue. There is mild associated bruising, but no significant swelling, or deformity. She seems to resist movement of the right hand, secondary to pain  Neurological: She is alert. She has normal strength. No sensory deficit. She exhibits normal muscle tone. Coordination normal.  Skin: Skin is warm, dry and intact.  Psychiatric: She has a normal mood and affect. Her behavior is normal.    ED Course  Procedures (including critical care time)   Wound care by nursing including cleansing and application of a DuoDERM dressing on the right hand wound      Dg Hand Complete Right  10/18/2012   *RADIOLOGY REPORT*  Clinical Data: Hand injury  RIGHT HAND -  COMPLETE 3+ VIEW  Comparison: None  Findings: The bones are diffusely osteopenic.  There is no acute fracture or subluxation identified.  No radio-opaque foreign body or soft tissue calcification identified.  IMPRESSION:  1.  No acute findings.   Original Report Authenticated By: Signa Kell, M.D.   1. Abrasion, hand, right, initial encounter     MDM  Abrasion right hand. No associated fracture. He is stable for discharge.   Nursing Notes Reviewed/ Care Coordinated, and agree without changes. Applicable Imaging Reviewed.  Interpretation of Laboratory Data incorporated into ED treatment   Plan: Home Medications- usual; Home Treatments and Observation- watch for wound problems; return here if the recommended treatment, does not improve the symptoms; Recommended follow up- PCP check up in 3-4 days    Flint Melter, MD 10/18/12 [phone]

## 2012-11-02 ENCOUNTER — Encounter (HOSPITAL_COMMUNITY): Payer: Self-pay | Admitting: *Deleted

## 2012-11-02 ENCOUNTER — Emergency Department (HOSPITAL_COMMUNITY): Payer: Medicare Other

## 2012-11-02 ENCOUNTER — Observation Stay (HOSPITAL_COMMUNITY)
Admission: EM | Admit: 2012-11-02 | Discharge: 2012-11-03 | Disposition: A | Discharge status: Skilled Nursing Facility | Payer: Medicare Other | Attending: Internal Medicine | Admitting: Internal Medicine

## 2012-11-02 DIAGNOSIS — F0390 Unspecified dementia without behavioral disturbance: Secondary | ICD-10-CM

## 2012-11-02 DIAGNOSIS — K862 Cyst of pancreas: Secondary | ICD-10-CM

## 2012-11-02 DIAGNOSIS — G609 Hereditary and idiopathic neuropathy, unspecified: Secondary | ICD-10-CM

## 2012-11-02 DIAGNOSIS — K5731 Diverticulosis of large intestine without perforation or abscess with bleeding: Secondary | ICD-10-CM

## 2012-11-02 DIAGNOSIS — Z8719 Personal history of other diseases of the digestive system: Secondary | ICD-10-CM

## 2012-11-02 DIAGNOSIS — E860 Dehydration: Secondary | ICD-10-CM

## 2012-11-02 DIAGNOSIS — I209 Angina pectoris, unspecified: Secondary | ICD-10-CM

## 2012-11-02 DIAGNOSIS — I4891 Unspecified atrial fibrillation: Secondary | ICD-10-CM | POA: Insufficient documentation

## 2012-11-02 DIAGNOSIS — I2789 Other specified pulmonary heart diseases: Secondary | ICD-10-CM

## 2012-11-02 DIAGNOSIS — D509 Iron deficiency anemia, unspecified: Secondary | ICD-10-CM

## 2012-11-02 DIAGNOSIS — I2510 Atherosclerotic heart disease of native coronary artery without angina pectoris: Secondary | ICD-10-CM | POA: Insufficient documentation

## 2012-11-02 DIAGNOSIS — K625 Hemorrhage of anus and rectum: Secondary | ICD-10-CM

## 2012-11-02 DIAGNOSIS — I4821 Permanent atrial fibrillation: Secondary | ICD-10-CM

## 2012-11-02 DIAGNOSIS — M109 Gout, unspecified: Secondary | ICD-10-CM

## 2012-11-02 DIAGNOSIS — Z7901 Long term (current) use of anticoagulants: Secondary | ICD-10-CM

## 2012-11-02 DIAGNOSIS — E1165 Type 2 diabetes mellitus with hyperglycemia: Secondary | ICD-10-CM

## 2012-11-02 DIAGNOSIS — I10 Essential (primary) hypertension: Secondary | ICD-10-CM | POA: Insufficient documentation

## 2012-11-02 DIAGNOSIS — K219 Gastro-esophageal reflux disease without esophagitis: Secondary | ICD-10-CM

## 2012-11-02 DIAGNOSIS — E871 Hypo-osmolality and hyponatremia: Secondary | ICD-10-CM

## 2012-11-02 DIAGNOSIS — K863 Pseudocyst of pancreas: Secondary | ICD-10-CM

## 2012-11-02 DIAGNOSIS — I803 Phlebitis and thrombophlebitis of lower extremities, unspecified: Secondary | ICD-10-CM

## 2012-11-02 DIAGNOSIS — T148XXA Other injury of unspecified body region, initial encounter: Secondary | ICD-10-CM

## 2012-11-02 DIAGNOSIS — R112 Nausea with vomiting, unspecified: Principal | ICD-10-CM | POA: Insufficient documentation

## 2012-11-02 DIAGNOSIS — C7A8 Other malignant neuroendocrine tumors: Secondary | ICD-10-CM

## 2012-11-02 DIAGNOSIS — I639 Cerebral infarction, unspecified: Secondary | ICD-10-CM

## 2012-11-02 DIAGNOSIS — N390 Urinary tract infection, site not specified: Secondary | ICD-10-CM

## 2012-11-02 DIAGNOSIS — D72829 Elevated white blood cell count, unspecified: Secondary | ICD-10-CM

## 2012-11-02 DIAGNOSIS — I519 Heart disease, unspecified: Secondary | ICD-10-CM | POA: Insufficient documentation

## 2012-11-02 DIAGNOSIS — E785 Hyperlipidemia, unspecified: Secondary | ICD-10-CM

## 2012-11-02 DIAGNOSIS — R410 Disorientation, unspecified: Secondary | ICD-10-CM

## 2012-11-02 DIAGNOSIS — E7800 Pure hypercholesterolemia, unspecified: Secondary | ICD-10-CM

## 2012-11-02 DIAGNOSIS — R1013 Epigastric pain: Secondary | ICD-10-CM

## 2012-11-02 DIAGNOSIS — C9110 Chronic lymphocytic leukemia of B-cell type not having achieved remission: Secondary | ICD-10-CM

## 2012-11-02 DIAGNOSIS — E43 Unspecified severe protein-calorie malnutrition: Secondary | ICD-10-CM

## 2012-11-02 DIAGNOSIS — R1115 Cyclical vomiting syndrome unrelated to migraine: Secondary | ICD-10-CM

## 2012-11-02 DIAGNOSIS — I214 Non-ST elevation (NSTEMI) myocardial infarction: Secondary | ICD-10-CM

## 2012-11-02 DIAGNOSIS — E109 Type 1 diabetes mellitus without complications: Secondary | ICD-10-CM

## 2012-11-02 DIAGNOSIS — G9340 Encephalopathy, unspecified: Secondary | ICD-10-CM

## 2012-11-02 LAB — CBC WITH DIFFERENTIAL/PLATELET
Basophils Relative: 0 % (ref 0–1)
Eosinophils Absolute: 0.2 10*3/uL (ref 0.0–0.7)
Eosinophils Relative: 1 % (ref 0–5)
HCT: 36.4 % (ref 36.0–46.0)
Hemoglobin: 12.1 g/dL (ref 12.0–15.0)
MCH: 30.3 pg (ref 26.0–34.0)
MCHC: 33.2 g/dL (ref 30.0–36.0)
Monocytes Absolute: 0.9 10*3/uL (ref 0.1–1.0)
Monocytes Relative: 7 % (ref 3–12)
Neutrophils Relative %: 69 % (ref 43–77)

## 2012-11-02 LAB — URINALYSIS W MICROSCOPIC + REFLEX CULTURE
Leukocytes, UA: NEGATIVE
Nitrite: NEGATIVE
Specific Gravity, Urine: 1.025 (ref 1.005–1.030)
pH: 6 (ref 5.0–8.0)

## 2012-11-02 LAB — COMPREHENSIVE METABOLIC PANEL
Albumin: 3.4 g/dL — ABNORMAL LOW (ref 3.5–5.2)
BUN: 15 mg/dL (ref 6–23)
Calcium: 10.2 mg/dL (ref 8.4–10.5)
Creatinine, Ser: 0.65 mg/dL (ref 0.50–1.10)
Total Protein: 7.6 g/dL (ref 6.0–8.3)

## 2012-11-02 LAB — TROPONIN I: Troponin I: 0.83 ng/mL (ref <0.30)

## 2012-11-02 LAB — LIPASE, BLOOD: Lipase: 14 U/L (ref 11–59)

## 2012-11-02 MED ORDER — LORAZEPAM 2 MG/ML IJ SOLN: 0.5000 mg | Freq: Once | INTRAMUSCULAR | Status: AC

## 2012-11-02 MED ORDER — LORAZEPAM 2 MG/ML IJ SOLN
INTRAMUSCULAR | Status: AC
  Administered 2012-11-02: 0.5 mg via INTRAVENOUS
  Filled 2012-11-02: qty 1

## 2012-11-02 MED ORDER — ONDANSETRON HCL 4 MG/2ML IJ SOLN: 4.0000 mg | Freq: Three times a day (TID) | INTRAMUSCULAR | Status: DC | PRN

## 2012-11-02 MED ORDER — ONDANSETRON HCL 4 MG/2ML IJ SOLN
INTRAMUSCULAR | Status: AC
  Administered 2012-11-02: 4 mg via INTRAVENOUS
  Filled 2012-11-02: qty 2

## 2012-11-02 MED ORDER — LORAZEPAM 1 MG PO TABS: 0.5000 mg | ORAL_TABLET | Freq: Once | ORAL | Status: DC

## 2012-11-02 MED ORDER — LORAZEPAM 1 MG PO TABS
ORAL_TABLET | ORAL | Status: AC
  Filled 2012-11-02: qty 1

## 2012-11-02 MED ORDER — SODIUM CHLORIDE 0.9 % IV SOLN
INTRAVENOUS | Status: DC
  Administered 2012-11-02: 17:00:00 via INTRAVENOUS

## 2012-11-02 MED ORDER — ONDANSETRON HCL 4 MG/2ML IJ SOLN: 4.0000 mg | Freq: Once | INTRAMUSCULAR | Status: AC

## 2012-11-02 MED ORDER — SODIUM CHLORIDE 0.9 % IV SOLN: INTRAVENOUS | Status: AC

## 2012-11-02 NOTE — ED Notes (Signed)
Pt sleeping. NAD noted at this time.  

## 2012-11-02 NOTE — ED Notes (Signed)
CRITICAL VALUE ALERT  Critical value received:  Trop 0.84  Date of notification:  11/02/2012  Time of notification:   20:03 Critical value read back: yes  Nurse who received alert:  Juliette Alcide RN   MD notified (1st page):  Dr. Clarene Duke  Time of first page:  20;03  MD notified (2nd page):  Time of second page:  Responding MD:  Dr. Clarene Duke  Time MD responded:  [phone]

## 2012-11-02 NOTE — ED Notes (Signed)
Pt incontinent of urine and small amount of stool, pt cleaned, bed linen changed,

## 2012-11-02 NOTE — H&P (Signed)
[facility] History and Physical  [name]  AVW:[id number]  DOB: 01/21/1930   DOA: 11/02/2012   PCP:   Catalina Pizza, MD   Chief Complaint:  vomiting  HPI: [name] is a 79 y.o. female.  Elderly Caucasian lady a resident of [facility] facility sent to the emergency room because of vomiting. According to the emergency room physician vomiting has persisted in the emergency room despite antiemetics, and she is concerned about elevated troponins and does not feel comfortable sending patient back to the nursing facility or adult overnight observation .  The patient is too demented for interview; denies any discomfort; the nurses report she has not vomited, but has been kept n.p.o. in the emergency room.  Patient has a history of chronically elevated troponin which a cardiologist feel is not of cardiac significance. Her current troponin level is in fact lower than her recent baselines.  Rewiew of Systems:   Unable to obtain because of patient's mental status  Past Medical History  Diagnosis Date  . HTN (hypertension)   . Coronary atherosclerosis of unspecified type of vessel, native or graft     a. remote CABG in 1999 with Dr. Laneta Simmers. b. s/p PCI with stent to LCX in 2009 (Platinum protocol study). c. NSTEMI 10/2011 s/p BMS to SVG->diagonal.  . Personal history of other diseases of digestive system   . Gout, unspecified     "have had it in my hands and my feet"  . Unspecified hereditary and idiopathic peripheral neuropathy   . Esophageal reflux   . Anemia   . GI bleed   . Leukocytosis 03/16/2011  . Hyperlipidemia     Statin intolerant  . Pancreas disorder     prior pancreatectomy in 2011 complicated by leak requiring prolong and repeated drainage and subsequent pancreatic stent  . Other chronic pulmonary heart diseases   . Type I (juvenile type) diabetes mellitus without mention of complication, not stated as uncontrolled     Previously on insulin pump   . Permanent atrial fibrillation   . Anginal pain 11/24/11    "1st time"  . Exertional dyspnea 11/24/11  . Depression   . 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 CLL (chronic lymphocytic leukemia)     seeing Dr. [name]  . Myocardial infarction 1999    "on the verge of one"  . NSTEMI (non-ST elevated myocardial infarction) 10/2011    /H&P  . Stroke   . Atrial fibrillation   . Depression   . Acid reflux   . Dementia     Past Surgical History  Procedure Laterality Date  . Oophorectomy  1970's    w hysterectomy and bladder tacking procedure  . Belpharoptosis repair      right upper eyelid  . Esophagogastroduodenoscopy  07/11/2010  . Colonoscopy  10/20/06  . Tonsillectomy  1947  . Appendectomy  1970's?  . Abdominal hysterectomy  1970's  . Coronary angioplasty with stent placement  10/2011    "makes total of 4"; 20-30% ostial left main stenosis; occluded ostial LAD supplied by IMA graft, 20-30% scattered LCx stenoses with widely patent stent and 20% narrowing; diffuse RCA luminal irregularities up to 20-30%; SVG-diagonal patent, but proximal 90% stenosis s/p BMS; patent LIMA-LAD; LVEF 55-65%.  . Coronary angioplasty with stent placement  2009    LCX  . Cataract extraction w/ intraocular lens  implant, bilateral  ~ 2003  . Coronary artery bypass graft  1999    LIMA  to LAD, SVG to the DX  . Pancreatectomy  2011    c/b leak requiring multiple drainage attempts and stent placement), /H&P  . Cardiac surgery    . Colonoscopy  01/20/2012    Procedure: COLONOSCOPY;  Surgeon: Malissa Hippo, MD;  Location: [facility];  Service: Endoscopy;  Laterality: N/A;    Medications:  HOME MEDS: Prior to Admission medications   Medication Sig Start Date End Date Taking? Authorizing Provider  acetaminophen (TYLENOL) 325 MG tablet Take 650 mg by mouth 2 (two) times daily.   Yes Historical Provider, MD  cephALEXin (KEFLEX) 250 MG capsule Take 250 mg by mouth at bedtime.    Yes Historical Provider, MD  Cholecalciferol (VITAMIN D) 2000 UNITS CAPS Take 2 capsules by mouth daily.   Yes Historical Provider, MD  colchicine 0.6 MG tablet Take 0.6 mg by mouth daily. Gout Flare   Yes Historical Provider, MD  dextrose (INSTA-GLUCOSE) 40 % GEL Take 1 Tube by mouth once as needed (Use if blood sugar is 60-to recheck after 15 minutes).   Yes Historical Provider, MD  docusate sodium (COLACE) 100 MG capsule Take 100 mg by mouth daily.    Yes Historical Provider, MD  insulin aspart (NOVOLOG) 100 UNIT/ML injection Inject 10-12 Units into the skin 2 (two) times daily. Patient takes 10 units before lunch and 12 units before supper 07/04/12  Yes Lesle Chris Black, NP  insulin glargine (LANTUS) 100 UNIT/ML injection Inject 40 Units into the skin at bedtime.  07/04/12  Yes Lesle Chris Black, NP  LORazepam (ATIVAN) 0.5 MG tablet Take 1 tablet (0.5 mg total) by mouth 2 (two) times daily as needed for anxiety (agitation). 10/08/12  Yes Standley Brooking, MD  magnesium oxide (MAG-OX) 400 MG tablet Take 400 mg by mouth 3 (three) times daily.    Yes Historical Provider, MD  metoprolol tartrate (LOPRESSOR) 25 MG tablet Take 12.5 mg by mouth 2 (two) times daily.  11/14/11 11/13/12 Yes Dayna N Dunn, PA-C  Multiple Vitamin (DAILY VITE PO) Take 1 tablet by mouth daily.   Yes Historical Provider, MD  olmesartan (BENICAR) 40 MG tablet Take 40 mg by mouth daily. 11/14/11  Yes Dayna N Dunn, PA-C  omeprazole (PRILOSEC) 20 MG capsule Take 20 mg by mouth daily.   Yes Historical Provider, MD  polyethylene glycol (MIRALAX / GLYCOLAX) packet Take 17 g by mouth daily.   Yes Historical Provider, MD  QUEtiapine (SEROQUEL) 25 MG tablet Take 0.5 tablets (12.5 mg total) by mouth at bedtime. 10/08/12  Yes Standley Brooking, MD  Rivaroxaban (XARELTO) 20 MG TABS Take 20 mg by mouth daily.   Yes Historical Provider, MD  traMADol (ULTRAM) 50 MG tablet Take 50 mg by mouth 3 (three) times daily.   Yes Historical Provider, MD  vitamin C  (ASCORBIC ACID) 500 MG tablet Take 500 mg by mouth 2 (two) times daily.   Yes Historical Provider, MD  nitroGLYCERIN (NITROSTAT) 0.4 MG SL tablet Place 1 tablet (0.4 mg total) under the tongue every 5 (five) minutes x 3 doses as needed for chest pain. 11/14/11 11/13/12  Dayna N Dunn, PA-C     Allergies:  Allergies  Allergen Reactions  . Codeine Nausea And Vomiting  . Ivp Dye (Iodinated Diagnostic Agents) Other (See Comments)    Burst veins in her arm.   . Morphine Other (See Comments)    Not in Right State of Mind.   . Prednisone Other (See Comments)    Patient became  delirious/combative when given 60mg  dose  . Statins Other (See Comments)    Leg Cramps.     Social History:   reports that she has never smoked. She has never used smokeless tobacco. She reports that she does not drink alcohol or use illicit drugs.  Family History: Family History  Problem Relation Age of Onset  . Stroke Mother   . Lung cancer Father   . Healthy Son      Physical Exam: Filed Vitals:   11/02/12 1900 11/02/12 1940 11/02/12 2000 11/02/12 2100  BP: 151/91 166/93 166/89 167/95  Pulse: 83 83 79   Temp:      Resp: 16 19 20 25   SpO2: 96% 97% 97%    Blood pressure 167/95, pulse 79, temperature 98.3 F (36.8 C), resp. rate 25, SpO2 97.00%. There is no weight on file to calculate BMI.   GEN:  Pleasant elderly Caucasian lady lying bed in no acute distress;  PSYCH:  alert and oriented x1;  neither anxious nor depressed; affect is appropriate. HEENT: Mucous membranes pink and anicteric; PERRLA; EOM intact; no cervical lymphadenopathy Breasts:: Not examined CHEST WALL: No tenderness CHEST: Normal respiration, clear to auscultation bilaterally HEART: Irregular rhythm rhythm; no murmurs rubs or gallops BACK: Moderate kyphosis no scoliosis; no CVA tenderness ABDOMEN: Obese, soft non-tender; no masses, no organomegaly, normal abdominal bowel sounds; Rectal Exam: Not done EXTREMITIES:  age-appropriate  arthropathy of the hands and knees; no edema; no ulcerations. Genitalia: not examined PULSES: 2+ and symmetric SKIN: Normal hydration no rash or ulceration    Labs on Admission:  Basic Metabolic Panel:  Recent Labs Lab 11/02/12 1718  NA 134*  K 4.2  CL 96  CO2 28  GLUCOSE 198*  BUN 15  CREATININE 0.65  CALCIUM 10.2   Liver Function Tests:  Recent Labs Lab 11/02/12 1718  AST 17  ALT 10  ALKPHOS 84  BILITOT 0.3  PROT 7.6  ALBUMIN 3.4*    Recent Labs Lab 11/02/12 1718  LIPASE 14   No results found for this basename: AMMONIA,  in the last 168 hours CBC:  Recent Labs Lab 11/02/12 1718  WBC 13.1*  NEUTROABS 9.0*  HGB 12.1  HCT 36.4  MCV 91.0  PLT 568*   Cardiac Enzymes:  Recent Labs Lab 11/02/12 1718 11/02/12 1911  TROPONINI 0.83* 0.84*   BNP: No components found with this basename: POCBNP,  D-dimer: No components found with this basename: D-DIMER,  CBG:  Recent Labs Lab 11/02/12 2116  GLUCAP 217*    Radiological Exams on Admission: Dg Abd Acute W/chest  11/02/2012   *RADIOLOGY REPORT*  Clinical Data: Nausea with emesis.  ACUTE ABDOMEN SERIES (ABDOMEN 2 VIEW & CHEST 1 VIEW)  Comparison: Portable chest 10/07/2012.  Portable abdomen 10/11/2012.  Findings: There is stable cardiomegaly status post CABG.  The lungs are clear.  There is no pleural effusion or pneumothorax.  The bowel gas pattern is normal.  There is no free intraperitoneal air.  Scattered vascular calcifications are noted.  There is mild lumbar spondylosis associated with a convex left scoliosis.  IMPRESSION: No acute cardiopulmonary or abdominal process.   Original Report Authenticated By: Carey Bullocks, M.D.     Assessment/Plan  Active Problems:   Nausea and vomiting   PLAN: Observe this lady overnight IV fluids Advance diet as tolerated; if no vomiting demonstrated, return to nursing facility in the morning. If vomiting persists further investigation  Home Lantus and  give Sliding scale insulin to manage diabetes while  diet is unsure  Other plans as per orders.  Code Status: DO NOT RESUSCITATE Family Communication: No family available at present Disposition Plan: Likely back to the facility in the morning   [name] Nocturnist [facility] Pager [phone]   11/02/2012, 10:00 PM

## 2012-11-02 NOTE — ED Notes (Signed)
Family at bedside, updated on plan of care.

## 2012-11-02 NOTE — ED Notes (Addendum)
Bed alarm placed on pt, pt has made several attempts to climb out of bed, pt is more confused than she was earlier in the day, continues to ask about dr coming to see her even after EDP just left the room,

## 2012-11-02 NOTE — ED Notes (Signed)
Pt refusing to take po tablets, Dr. Clarene Duke notified,

## 2012-11-02 NOTE — ED Notes (Signed)
Pt's daughter Saul Dorsi notified of pt admission status, [phone]

## 2012-11-02 NOTE — ED Notes (Signed)
Pt from [facility] c/o 1 episode of vomiting after eating cabbage. Pt vomited x 1 upon arrival in ED.

## 2012-11-02 NOTE — ED Notes (Signed)
Pt alert, able to answer questions, denies any n/v, pain at present, delay explained to pt, pt expressed understanding,

## 2012-11-02 NOTE — ED Notes (Signed)
Pt over in xray

## 2012-11-02 NOTE — ED Notes (Signed)
Placed patient on the bed alarm.

## 2012-11-02 NOTE — ED Provider Notes (Signed)
History    CSN: [id number] Arrival date & time 11/02/12  1559  First MD Initiated Contact with Patient 11/02/12 1600     Chief Complaint  Patient presents with  . Emesis  . Nausea    Patient is a 79 y.o. female presenting with vomiting. The history is provided by the nursing home and the EMS personnel. The history is limited by the condition of the patient (Hx dementia).  Emesis Pt was seen at 1630.  Per EMS and NH report, pt with sudden onset and persistence of N/V after eating PTA. Pt vomiting on arrival to the ED. Pt has significant hx of dementia, currently denies any complaints. Denies CP/SOB, no abd pain.    Past Medical History  Diagnosis Date  . HTN (hypertension)   . Coronary atherosclerosis of unspecified type of vessel, native or graft     a. remote CABG in 1999 with Dr. Laneta Simmers. b. s/p PCI with stent to LCX in 2009 (Platinum protocol study). c. NSTEMI 10/2011 s/p BMS to SVG->diagonal.  . Personal history of other diseases of digestive system   . Gout, unspecified     "have had it in my hands and my feet"  . Unspecified hereditary and idiopathic peripheral neuropathy   . Esophageal reflux   . Anemia   . GI bleed   . Leukocytosis 03/16/2011  . Hyperlipidemia     Statin intolerant  . Pancreas disorder     prior pancreatectomy in 2011 complicated by leak requiring prolong and repeated drainage and subsequent pancreatic stent  . Other chronic pulmonary heart diseases   . Type I (juvenile type) diabetes mellitus without mention of complication, not stated as uncontrolled     Previously on insulin pump  . Permanent atrial fibrillation   . Anginal pain 11/24/11    "1st time"  . Exertional dyspnea 11/24/11  . Depression   . 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 CLL (chronic lymphocytic leukemia)     seeing Dr. [name]  . Myocardial infarction 1999    "on the verge of one"  . NSTEMI (non-ST elevated myocardial infarction) 10/2011    /H&P  .  Stroke   . Atrial fibrillation   . Depression   . Acid reflux   . Dementia    Past Surgical History  Procedure Laterality Date  . Oophorectomy  1970's    w hysterectomy and bladder tacking procedure  . Belpharoptosis repair      right upper eyelid  . Esophagogastroduodenoscopy  07/11/2010  . Colonoscopy  10/20/06  . Tonsillectomy  1947  . Appendectomy  1970's?  . Abdominal hysterectomy  1970's  . Coronary angioplasty with stent placement  10/2011    "makes total of 4"; 20-30% ostial left main stenosis; occluded ostial LAD supplied by IMA graft, 20-30% scattered LCx stenoses with widely patent stent and 20% narrowing; diffuse RCA luminal irregularities up to 20-30%; SVG-diagonal patent, but proximal 90% stenosis s/p BMS; patent LIMA-LAD; LVEF 55-65%.  . Coronary angioplasty with stent placement  2009    LCX  . Cataract extraction w/ intraocular lens  implant, bilateral  ~ 2003  . Coronary artery bypass graft  1999    LIMA to LAD, SVG to the DX  . Pancreatectomy  2011    c/b leak requiring multiple drainage attempts and stent placement), /H&P  . Cardiac surgery    . Colonoscopy  01/20/2012    Procedure: COLONOSCOPY;  Surgeon: Malissa Hippo, MD;  Location: [facility];  Service: Endoscopy;  Laterality: N/A;   Family History  Problem Relation Age of Onset  . Stroke Mother   . Lung cancer Father   . Healthy Son    History  Substance Use Topics  . Smoking status: Never Smoker   . Smokeless tobacco: Never Used  . Alcohol Use: No    Review of Systems  Unable to perform ROS: Dementia  Gastrointestinal: Positive for vomiting.    Allergies  Codeine; Ivp dye; Morphine; Prednisone; and Statins  Home Medications   Current Outpatient Rx  Name  Route  Sig  Dispense  Refill  . acetaminophen (TYLENOL) 325 MG tablet   Oral   Take 650 mg by mouth 2 (two) times daily.         . cephALEXin (KEFLEX) 250 MG capsule   Oral   Take 250 mg by mouth at bedtime.         .  Cholecalciferol (VITAMIN D) 2000 UNITS CAPS   Oral   Take 2 capsules by mouth daily.         . colchicine 0.6 MG tablet   Oral   Take 0.6 mg by mouth daily. Gout Flare         . dextrose (INSTA-GLUCOSE) 40 % GEL   Oral   Take 1 Tube by mouth once as needed (Use if blood sugar is 60-to recheck after 15 minutes).         Marland Kitchen docusate sodium (COLACE) 100 MG capsule   Oral   Take 100 mg by mouth daily.          . insulin aspart (NOVOLOG) 100 UNIT/ML injection   Subcutaneous   Inject 10-12 Units into the skin 2 (two) times daily. Patient takes 10 units before lunch and 12 units before supper         . insulin glargine (LANTUS) 100 UNIT/ML injection   Subcutaneous   Inject 40 Units into the skin at bedtime.          Marland Kitchen LORazepam (ATIVAN) 0.5 MG tablet   Oral   Take 1 tablet (0.5 mg total) by mouth 2 (two) times daily as needed for anxiety (agitation).   10 tablet   0   . magnesium oxide (MAG-OX) 400 MG tablet   Oral   Take 400 mg by mouth 3 (three) times daily.          . metoprolol tartrate (LOPRESSOR) 25 MG tablet   Oral   Take 12.5 mg by mouth 2 (two) times daily.          . Multiple Vitamin (DAILY VITE PO)   Oral   Take 1 tablet by mouth daily.         Marland Kitchen olmesartan (BENICAR) 40 MG tablet   Oral   Take 40 mg by mouth daily.         Marland Kitchen omeprazole (PRILOSEC) 20 MG capsule   Oral   Take 20 mg by mouth daily.         . polyethylene glycol (MIRALAX / GLYCOLAX) packet   Oral   Take 17 g by mouth daily.         . QUEtiapine (SEROQUEL) 25 MG tablet   Oral   Take 0.5 tablets (12.5 mg total) by mouth at bedtime.         . Rivaroxaban (XARELTO) 20 MG TABS   Oral   Take 20 mg by mouth daily.         Marland Kitchen  traMADol (ULTRAM) 50 MG tablet   Oral   Take 50 mg by mouth 3 (three) times daily.         . vitamin C (ASCORBIC ACID) 500 MG tablet   Oral   Take 500 mg by mouth 2 (two) times daily.         . nitroGLYCERIN (NITROSTAT) 0.4 MG SL  tablet   Sublingual   Place 1 tablet (0.4 mg total) under the tongue every 5 (five) minutes x 3 doses as needed for chest pain.   25 tablet   4    BP 166/93  Pulse 83  Temp(Src) 98.3 F (36.8 C)  Resp 19  SpO2 97% Physical Exam 1635: Physical examination:  Nursing notes reviewed; Vital signs and O2 SAT reviewed;  Constitutional: Well developed, Well nourished, In no acute distress; Head:  Normocephalic, atraumatic; Eyes: EOMI, PERRL, No scleral icterus; ENMT: Mouth and pharynx normal, Mucous membranes moist; Neck: Supple, Full range of motion, No lymphadenopathy; Cardiovascular: Irregular irregular rate and rhythm, No gallop; Respiratory: Breath sounds clear & equal bilaterally, No wheezes. Speaking full sentences with ease, Normal respiratory effort/excursion; Chest: Nontender, Movement normal; Abdomen: Soft, Nontender, Nondistended, Normal bowel sounds; Genitourinary: No CVA tenderness; Extremities: Pulses normal, No tenderness, No edema, No calf edema or asymmetry.; Neuro: Awake,alert, confused re: time, place, events per hx dementia. Major CN grossly intact. No facial droop. Speech clear. No gross focal motor deficits in extremities.; Skin: Color normal, Warm, Dry.   ED Course  Procedures     MDM  MDM Reviewed: previous chart, nursing note and vitals Reviewed previous: labs and ECG Interpretation: labs, ECG and x-ray    Date: 11/02/2012  Rate: 69  Rhythm: atrial fibrillation  QRS Axis: normal  Intervals: normal  ST/T Wave abnormalities: nonspecific ST/T changes  Conduction Disutrbances:none  Narrative Interpretation:   Old EKG Reviewed: unchanged; no significant changes from previous EKG dated 10/06/2012.   Results for orders placed during the hospital encounter of 11/02/12  URINALYSIS W MICROSCOPIC + REFLEX CULTURE      Result Value Range   Color, Urine YELLOW  YELLOW   APPearance CLEAR  CLEAR   Specific Gravity, Urine 1.025  1.005 - 1.030   pH 6.0  5.0 - 8.0    Glucose, UA NEGATIVE  NEGATIVE mg/dL   Hgb urine dipstick NEGATIVE  NEGATIVE   Bilirubin Urine NEGATIVE  NEGATIVE   Ketones, ur TRACE (*) NEGATIVE mg/dL   Protein, ur TRACE (*) NEGATIVE mg/dL   Urobilinogen, UA 0.2  0.0 - 1.0 mg/dL   Nitrite NEGATIVE  NEGATIVE   Leukocytes, UA NEGATIVE  NEGATIVE   WBC, UA 0-2  <3 WBC/hpf   RBC / HPF 0-2  <3 RBC/hpf   Squamous Epithelial / LPF MANY (*) RARE   Urine-Other MUCOUS PRESENT    CBC WITH DIFFERENTIAL      Result Value Range   WBC 13.1 (*) 4.0 - 10.5 K/uL   RBC 4.00  3.87 - 5.11 MIL/uL   Hemoglobin 12.1  12.0 - 15.0 g/dL   HCT 16.1  09.6 - 04.5 %   MCV 91.0  78.0 - 100.0 fL   MCH 30.3  26.0 - 34.0 pg   MCHC 33.2  30.0 - 36.0 g/dL   RDW 40.9  81.1 - 91.4 %   Platelets 568 (*) 150 - 400 K/uL   Neutrophils Relative % 69  43 - 77 %   Neutro Abs 9.0 (*) 1.7 - 7.7 K/uL   Lymphocytes Relative  23  12 - 46 %   Lymphs Abs 3.0  0.7 - 4.0 K/uL   Monocytes Relative 7  3 - 12 %   Monocytes Absolute 0.9  0.1 - 1.0 K/uL   Eosinophils Relative 1  0 - 5 %   Eosinophils Absolute 0.2  0.0 - 0.7 K/uL   Basophils Relative 0  0 - 1 %   Basophils Absolute 0.0  0.0 - 0.1 K/uL  COMPREHENSIVE METABOLIC PANEL      Result Value Range   Sodium 134 (*) 135 - 145 mEq/L   Potassium 4.2  3.5 - 5.1 mEq/L   Chloride 96  96 - 112 mEq/L   CO2 28  19 - 32 mEq/L   Glucose, Bld 198 (*) 70 - 99 mg/dL   BUN 15  6 - 23 mg/dL   Creatinine, Ser 1.61  0.50 - 1.10 mg/dL   Calcium 09.6  8.4 - 04.5 mg/dL   Total Protein 7.6  6.0 - 8.3 g/dL   Albumin 3.4 (*) 3.5 - 5.2 g/dL   AST 17  0 - 37 U/L   ALT 10  0 - 35 U/L   Alkaline Phosphatase 84  39 - 117 U/L   Total Bilirubin 0.3  0.3 - 1.2 mg/dL   GFR calc non Af Amer 81 (*) >90 mL/min   GFR calc Af Amer >90  >90 mL/min  LIPASE, BLOOD      Result Value Range   Lipase 14  11 - 59 U/L  TROPONIN I      Result Value Range   Troponin I 0.83 (*) <0.30 ng/mL  TROPONIN I      Result Value Range   Troponin I 0.84 (*) <0.30 ng/mL    Dg Abd Acute W/chest 11/02/2012   *RADIOLOGY REPORT*  Clinical Data: Nausea with emesis.  ACUTE ABDOMEN SERIES (ABDOMEN 2 VIEW & CHEST 1 VIEW)  Comparison: Portable chest 10/07/2012.  Portable abdomen 10/11/2012.  Findings: There is stable cardiomegaly status post CABG.  The lungs are clear.  There is no pleural effusion or pneumothorax.  The bowel gas pattern is normal.  There is no free intraperitoneal air.  Scattered vascular calcifications are noted.  There is mild lumbar spondylosis associated with a convex left scoliosis.  IMPRESSION: No acute cardiopulmonary or abdominal process.   Original Report Authenticated By: Carey Bullocks, M.D.     2015:  +N/V while in the ED. Continues to intermittently gag into emesis bag. Elevated troponins, but no acute changes on EKG. Pt does have hx of NSTEMI 1 month ago with the troponins today trending lower than that admission. T/C to [facility] Dr. Orvan Falconer, case discussed, including:  HPI, pertinent PM/SHx, VS/PE, dx testing, ED course and treatment:  Agreeable to observation admit, requests to write temporary orders, obtain tele bed to team 1.   Laray Anger, DO 11/04/12 [phone]

## 2012-11-02 NOTE — ED Notes (Signed)
Dr. McManus at bedside,  

## 2012-11-03 ENCOUNTER — Encounter (HOSPITAL_COMMUNITY): Payer: Self-pay | Admitting: *Deleted

## 2012-11-03 DIAGNOSIS — F0390 Unspecified dementia without behavioral disturbance: Secondary | ICD-10-CM

## 2012-11-03 LAB — CBC
HCT: 37.1 % (ref 36.0–46.0)
Hemoglobin: 12.3 g/dL (ref 12.0–15.0)
MCHC: 33.2 g/dL (ref 30.0–36.0)
RBC: 4.06 MIL/uL (ref 3.87–5.11)

## 2012-11-03 LAB — BASIC METABOLIC PANEL
BUN: 9 mg/dL (ref 6–23)
Chloride: 101 mEq/L (ref 96–112)
GFR calc Af Amer: >90 mL/min (ref >90)
GFR calc non Af Amer: 85 mL/min — ABNORMAL LOW (ref >90)
Potassium: 3.9 mEq/L (ref 3.5–5.1)
Sodium: 136 mEq/L (ref 135–145)

## 2012-11-03 LAB — URINALYSIS, ROUTINE W REFLEX MICROSCOPIC
Glucose, UA: NEGATIVE mg/dL
Leukocytes, UA: NEGATIVE
Protein, ur: NEGATIVE mg/dL
Specific Gravity, Urine: 1.015 (ref 1.005–1.030)
pH: 7 (ref 5.0–8.0)

## 2012-11-03 LAB — GLUCOSE, CAPILLARY: Glucose-Capillary: 220 mg/dL — ABNORMAL HIGH (ref 70–99)

## 2012-11-03 LAB — URINE MICROSCOPIC-ADD ON

## 2012-11-03 MED ORDER — QUETIAPINE FUMARATE 25 MG PO TABS
12.5000 mg | ORAL_TABLET | Freq: Every day | ORAL | Status: DC
  Filled 2012-11-03: qty 1

## 2012-11-03 MED ORDER — ACETAMINOPHEN 325 MG PO TABS: 650.0000 mg | ORAL_TABLET | Freq: Four times a day (QID) | ORAL | Status: DC | PRN

## 2012-11-03 MED ORDER — RIVAROXABAN 10 MG PO TABS
20.0000 mg | ORAL_TABLET | Freq: Every day | ORAL | Status: DC
  Administered 2012-11-03: 20 mg via ORAL
  Filled 2012-11-03: qty 2

## 2012-11-03 MED ORDER — INSULIN ASPART 100 UNIT/ML ~~LOC~~ SOLN
0.0000 [IU] | Freq: Three times a day (TID) | SUBCUTANEOUS | Status: DC
  Administered 2012-11-03: 3 [IU] via SUBCUTANEOUS
  Administered 2012-11-03: 2 [IU] via SUBCUTANEOUS

## 2012-11-03 MED ORDER — IRBESARTAN 300 MG PO TABS
300.0000 mg | ORAL_TABLET | Freq: Every day | ORAL | Status: DC
  Administered 2012-11-03: 300 mg via ORAL
  Filled 2012-11-03: qty 1

## 2012-11-03 MED ORDER — NITROGLYCERIN 0.4 MG SL SUBL: 0.4000 mg | SUBLINGUAL_TABLET | SUBLINGUAL | Status: DC | PRN

## 2012-11-03 MED ORDER — LORAZEPAM 0.5 MG PO TABS: 0.5000 mg | ORAL_TABLET | Freq: Two times a day (BID) | ORAL | Status: DC | PRN

## 2012-11-03 MED ORDER — ONDANSETRON HCL 4 MG/2ML IJ SOLN: 4.0000 mg | Freq: Four times a day (QID) | INTRAMUSCULAR | Status: DC | PRN

## 2012-11-03 MED ORDER — INSULIN ASPART 100 UNIT/ML ~~LOC~~ SOLN: 0.0000 [IU] | Freq: Every day | SUBCUTANEOUS | Status: DC

## 2012-11-03 MED ORDER — POTASSIUM CHLORIDE IN NACL 20-0.9 MEQ/L-% IV SOLN
INTRAVENOUS | Status: DC
  Administered 2012-11-03 (×2): via INTRAVENOUS

## 2012-11-03 MED ORDER — COLCHICINE 0.6 MG PO TABS
0.6000 mg | ORAL_TABLET | Freq: Every day | ORAL | Status: DC
  Administered 2012-11-03: 0.6 mg via ORAL
  Filled 2012-11-03: qty 1

## 2012-11-03 MED ORDER — DOCUSATE SODIUM 100 MG PO CAPS
100.0000 mg | ORAL_CAPSULE | Freq: Every day | ORAL | Status: DC
  Filled 2012-11-03: qty 1

## 2012-11-03 MED ORDER — METOPROLOL TARTRATE 25 MG PO TABS
12.5000 mg | ORAL_TABLET | Freq: Two times a day (BID) | ORAL | Status: DC
  Administered 2012-11-03: 12.5 mg via ORAL
  Filled 2012-11-03 (×2): qty 1

## 2012-11-03 MED ORDER — PANTOPRAZOLE SODIUM 40 MG PO TBEC
40.0000 mg | DELAYED_RELEASE_TABLET | Freq: Every day | ORAL | Status: DC
  Administered 2012-11-03: 40 mg via ORAL
  Filled 2012-11-03: qty 1

## 2012-11-03 NOTE — Progress Notes (Signed)
11/03/12 1431 Patient being discharged back to [facility]. Family aware of and agreeable to discharge plan per social work. Pt agreeable to discharge back to [facility]. Discharge packet prepared per social work, instructions reviewed with caregiver from [facility] prior to discharge per V. Dildy, LPN. Copy of instructions sent with patient, no prescriptions ordered per MD. IV site d/c'd this afternoon and within normal limits. Pt left floor in stable condition via w/c accompanied by nursing staff member. Earnstine Regal, RN

## 2012-11-03 NOTE — Progress Notes (Signed)
UR chart review completed.  

## 2012-11-03 NOTE — Care Management Note (Unsigned)
    Page 1 of 1   11/03/2012     12:40:02 PM   CARE MANAGEMENT NOTE 11/03/2012  Patient:  [name], [name]   Account Number:  [id number]  Date Initiated:  11/03/2012  Documentation initiated by:  Sharrie Rothman  Subjective/Objective Assessment:   Pt admitted from [facility] with nausea and vomiting. Pt will return to facility when medically stable.     Action/Plan:   CSW to arrange discharge to facility when medically stable. Pt for discharge today.   Anticipated DC Date:  11/03/2012   Anticipated DC Plan:  ASSISTED LIVING / REST HOME  In-house referral  Clinical Social Worker      DC Planning Services  CM consult      Choice offered to / List presented to:             Status of service:  Completed, signed off Medicare Important Message given?   (If response is "NO", the following Medicare IM given date fields will be blank) Date Medicare IM given:   Date Additional Medicare IM given:    Discharge Disposition:  ASSISTED LIVING  Per UR Regulation:    If discussed at Long Length of Stay Meetings, dates discussed:    Comments:  11/03/12 1240 Arlyss Queen, RN BSN CM

## 2012-11-03 NOTE — Progress Notes (Signed)
We received a request for a PT consult.  Per MSW, pt needs total care at [facility] and is being discharged back there today.  She will be transferred to SNF on 11-11-12 for long term care.  She will not be appropriate for PT.  We will d/c consult.

## 2012-11-03 NOTE — Progress Notes (Signed)
11/03/12 0953 Patient tolerated breakfast well with no complaints, ate 50% of breakfast. No c/o nausea, no vomiting. Notified CMT of telemetry d/c order prior to d/c. In and out cath completed, pt tolerated well. Urine specimen clear yellow, sent to lab as ordered. Earnstine Regal, RN

## 2012-11-03 NOTE — Clinical Social Work Psychosocial (Signed)
Clinical Social Work Department BRIEF PSYCHOSOCIAL ASSESSMENT 11/03/2012  Patient:  KYNNADI, DICENSO     Account Number:  [id number]     Admit date:  11/02/2012  Clinical Social Worker:  Nancie Neas  Date/Time:  11/03/2012 01:15 PM  Referred by:  CSW  Date Referred:  11/03/2012 Referred for  ALF Placement   Other Referral:   Interview type:  Family Other interview type:   daughter-in-law Kim    PSYCHOSOCIAL DATA Living Status:  FACILITY Admitted from facility:  [facility] Level of care:  Assisted Living Primary support name:  Kim Primary support relationship to patient:  FAMILY Degree of support available:   supportive    CURRENT CONCERNS Current Concerns  Post-Acute Placement   Other Concerns:    SOCIAL WORK ASSESSMENT / PLAN CSW spoke with [name] as pt is oriented to self only. Pt is well known to CSW from previous admissions. She has been a resident at [facility] for about 7 months. Pt is on AL unit, but has dementia. She is a total assist per Marchelle Folks at [facility]. Pt has been receiving home health PT and RN in house. Facility and family have worked on a plan to have pt transfer to [facility] on August 1. Pt d/c today from hospital back to [facility]. Family and facility aware and agreeable with facility to transport. CSW will fax d/c summary. Marchelle Folks agreeable to no FL2 as <24 hour observation.   Assessment/plan status:  Referral to [facility] Other assessment/ plan:   Information/referral to community resources:   [employer]    PATIENT'S/FAMILY'S RESPONSE TO PLAN OF CARE: Pt not oriented. Family and facility agreeable to return to [facility] with plan to transfer to [facility] next week.       Derenda Fennel, [place] [phone]

## 2012-11-03 NOTE — Plan of Care (Signed)
Problem: Phase I Progression Outcomes Goal: OOB as tolerated unless otherwise ordered Outcome: Progressing 11/03/12 1429 assisted up to chair today, tolerated well with two assist.

## 2012-11-03 NOTE — Progress Notes (Signed)
11/03/12 1155 assisted patient up to chair, tolerated well with two assist. Chair alarm on for safety, call light and phone within reach. Notified Toya Smothers, NP of u/a obtained and sent to lab, patient tolerated 50% of breakfast with no c/o nausea, no vomiting. Stated okay. Earnstine Regal, RN

## 2012-11-03 NOTE — Discharge Summary (Signed)
Physician Discharge Summary  [name] MVH:[id number] DOB: June 10, 1929 DOA: 11/02/2012  PCP: Catalina Pizza, MD  Admit date: 11/02/2012 Discharge date: 11/03/2012  Time spent: 40 minutes  Recommendations for Outpatient Follow-up:  1. PCP 2 weeks for evaluation of symptoms.   Discharge Diagnoses:  Principal Problem:   Nausea and vomiting Active Problems:   HYPERTENSION   DIASTOLIC DYSFUNCTION   CAD (coronary artery disease)   Atrial fibrillation   Discharge Condition: stable  Diet recommendation: carb modified  Filed Weights   11/02/12 2313  Weight: 125 lb 10.6 oz (57 kg)    History of present illness:    Hospital Course:  Nausea and vomiting: etiology uncertain. Urinalysis and chest xray unremarkable. Admitted to floor for observation. Was provided gently IV hydration.  Denies abdominal pain/nausea. Abdominal exam benign. At discharge eating 50% meals without complaints of nausea or vomiting. No vomiting this hospitalization.  Pt with hx of chronic UTI. Of note, patient to be admitted to SNF in 6 days per current facility  Active Problems:  Elevated troponin: chart review indicates chronic elevated troponin: Elevated troponin: Seen 10/2011 (3.38-3.65), 11/2011 (3.60-3.96),01/2012 (3.62-3.83). Elevation of troponin is chronic in nature, previous comments from cardiology suggested that this is not reflective of an acute cardiac event. Previously discussed with family, no further investigation. Patient is not an interventional candidate regardless. Continue beta blocker therapy, ARB. Not on aspirin secondary to history of GI bleed and patient already on Xarelto.   HYPERTENSION: fair control. Will continue home meds   DIASTOLIC DYSFUNCTION chronic. Echo in 10/2011 yields a pattern of mild LVH. Systolic function was normal. The estimated ejection fraction was in the range of 55% to 60%. Indeterminant diastolic function. Currently compensated.    DM: poor control. Will continue home  lantus and use SSI for glycemic control. Will be discharged on home regimen. While control somewhat poor, suspect it is adequate given patients variable appetite, dementia etc.    CAD (coronary artery disease): no chest pain. No events on tele. Continue home meds   Atrial fibrillation: rate controlled. Continue BB      Procedures: Consultations:  none  Discharge Exam: Filed Vitals:   11/02/12 2200 11/02/12 2313 11/03/12 0203 11/03/12 0527  BP: 156/83 137/69 172/97 159/89  Pulse: 82 77 88 93  Temp:  98.2 F (36.8 C) 98.1 F (36.7 C) 98.4 F (36.9 C)  TempSrc:  Oral Oral Oral  Resp: 17 18 18 18   Height:  5\' 4"  (1.626 m)    Weight:  125 lb 10.6 oz (57 kg)    SpO2: 99% 98% 96% 97%    General: well nourished somewhat frail  Cardiovascular: irregularly irregular. No MGR no LE edema Respiratory: normal effort BS clear bilaterally no wheeze, no rhonchi Abdomen: soft +BS non-tender to palpation no guarding  Neuro: oriented to self only. Unable to make needs known. Has difficulty following commands.   Discharge Instructions     Medication List         acetaminophen 325 MG tablet  Commonly known as:  TYLENOL  Take 650 mg by mouth 2 (two) times daily.     cephALEXin 250 MG capsule  Commonly known as:  KEFLEX  Take 250 mg by mouth at bedtime.     colchicine 0.6 MG tablet  Take 0.6 mg by mouth daily. Gout Flare     DAILY VITE PO  Take 1 tablet by mouth daily.     docusate sodium 100 MG capsule  Commonly known as:  COLACE  Take 100 mg by mouth daily.     INSTA-GLUCOSE 40 % Gel  Generic drug:  dextrose  Take 1 Tube by mouth once as needed (Use if blood sugar is 60-to recheck after 15 minutes).     insulin aspart 100 UNIT/ML injection  Commonly known as:  novoLOG  Inject 10-12 Units into the skin 2 (two) times daily. Patient takes 10 units before lunch and 12 units before supper     insulin glargine 100 UNIT/ML injection  Commonly known as:  LANTUS  Inject 40  Units into the skin at bedtime.     LORazepam 0.5 MG tablet  Commonly known as:  ATIVAN  Take 1 tablet (0.5 mg total) by mouth 2 (two) times daily as needed for anxiety (agitation).     magnesium oxide 400 MG tablet  Commonly known as:  MAG-OX  Take 400 mg by mouth 3 (three) times daily.     metoprolol tartrate 25 MG tablet  Commonly known as:  LOPRESSOR  Take 12.5 mg by mouth 2 (two) times daily.     nitroGLYCERIN 0.4 MG SL tablet  Commonly known as:  NITROSTAT  Place 1 tablet (0.4 mg total) under the tongue every 5 (five) minutes x 3 doses as needed for chest pain.     olmesartan 40 MG tablet  Commonly known as:  BENICAR  Take 40 mg by mouth daily.     omeprazole 20 MG capsule  Commonly known as:  PRILOSEC  Take 20 mg by mouth daily.     polyethylene glycol packet  Commonly known as:  MIRALAX / GLYCOLAX  Take 17 g by mouth daily.     QUEtiapine 25 MG tablet  Commonly known as:  SEROQUEL  Take 0.5 tablets (12.5 mg total) by mouth at bedtime.     traMADol 50 MG tablet  Commonly known as:  ULTRAM  Take 50 mg by mouth 3 (three) times daily.     vitamin C 500 MG tablet  Commonly known as:  ASCORBIC ACID  Take 500 mg by mouth 2 (two) times daily.     Vitamin D 2000 UNITS Caps  Take 2 capsules by mouth daily.     XARELTO 20 MG Tabs  Generic drug:  Rivaroxaban  Take 20 mg by mouth daily.       Allergies  Allergen Reactions  . Codeine Nausea And Vomiting  . Ivp Dye (Iodinated Diagnostic Agents) Other (See Comments)    Burst veins in her arm.   . Morphine Other (See Comments)    Not in Right State of Mind.   . Prednisone Other (See Comments)    Patient became delirious/combative when given 60mg  dose  . Statins Other (See Comments)    Leg Cramps.       The results of significant diagnostics from this hospitalization (including imaging, microbiology, ancillary and laboratory) are listed below for reference.    Significant Diagnostic Studies: Ct Head Wo  Contrast  10/06/2012   *RADIOLOGY REPORT*  Clinical Data: Severe headaches  CT HEAD WITHOUT CONTRAST  Technique:  Contiguous axial images were obtained from the base of the skull through the vertex without contrast.  Comparison: 08/19/2012  Findings: Moderate atrophy.  Chronic infarct right frontal lobe unchanged.  Chronic microvascular ischemia in the white matter. Chronic right PICA infarct unchanged.  Negative for acute infarct, hemorrhage, or mass lesion.  IMPRESSION: Atrophy and chronic ischemia.  No acute abnormality.   Original Report Authenticated By: Janeece Riggers, M.D.  Dg Chest Port 1 View  10/07/2012   *RADIOLOGY REPORT*  Clinical Data: Mental status changes, atrial fibrillation.  PORTABLE CHEST - 1 VIEW  Comparison: 11/24/2011.  Findings: Patient is rotated.  Trachea is midline.  Heart is enlarged but stable.  There may be streaky atelectasis in the right suprahilar region.  Lungs are otherwise clear.  No pleural fluid.  IMPRESSION: Question streaky atelectasis in the right suprahilar region. Follow-up PA and lateral view of the chest are recommended in further evaluation, as clinically indicated.   Original Report Authenticated By: Leanna Battles, M.D.   Dg Abd Acute W/chest  11/02/2012   *RADIOLOGY REPORT*  Clinical Data: Nausea with emesis.  ACUTE ABDOMEN SERIES (ABDOMEN 2 VIEW & CHEST 1 VIEW)  Comparison: Portable chest 10/07/2012.  Portable abdomen 10/11/2012.  Findings: There is stable cardiomegaly status post CABG.  The lungs are clear.  There is no pleural effusion or pneumothorax.  The bowel gas pattern is normal.  There is no free intraperitoneal air.  Scattered vascular calcifications are noted.  There is mild lumbar spondylosis associated with a convex left scoliosis.  IMPRESSION: No acute cardiopulmonary or abdominal process.   Original Report Authenticated By: Carey Bullocks, M.D.   Dg Hand Complete Right  10/18/2012   *RADIOLOGY REPORT*  Clinical Data: Hand injury  RIGHT HAND -  COMPLETE 3+ VIEW  Comparison: None  Findings: The bones are diffusely osteopenic.  There is no acute fracture or subluxation identified.  No radio-opaque foreign body or soft tissue calcification identified.  IMPRESSION:  1.  No acute findings.   Original Report Authenticated By: Signa Kell, M.D.    Microbiology: No results found for this or any previous visit (from the past 240 hour(s)).   Labs: Basic Metabolic Panel:  Recent Labs Lab 11/02/12 1718 11/03/12 0658  NA 134* 136  K 4.2 3.9  CL 96 101  CO2 28 26  GLUCOSE 198* 197*  BUN 15 9  CREATININE 0.65 0.57  CALCIUM 10.2 9.5   Liver Function Tests:  Recent Labs Lab 11/02/12 1718  AST 17  ALT 10  ALKPHOS 84  BILITOT 0.3  PROT 7.6  ALBUMIN 3.4*    Recent Labs Lab 11/02/12 1718  LIPASE 14   No results found for this basename: AMMONIA,  in the last 168 hours CBC:  Recent Labs Lab 11/02/12 1718 11/03/12 0454  WBC 13.1* 11.7*  NEUTROABS 9.0*  --   HGB 12.1 12.3  HCT 36.4 37.1  MCV 91.0 91.4  PLT 568* 372   Cardiac Enzymes:  Recent Labs Lab 11/02/12 1718 11/02/12 1911  TROPONINI 0.83* 0.84*    CBG:  Recent Labs Lab 11/02/12 2116 11/03/12 0209 11/03/12 0725 11/03/12 1158  GLUCAP 217* 189* 193* 220*       Signed:  Toya Smothers M  [facility] 11/03/2012, 12:41 PM Attending: Patient is seen and examined. She has no evidence of UTI based on urinalysis. Troponin elevation is chronic. She has tolerated diet this morning without any vomiting. Her abdomen is soft and nontender. She is clinically stable for discharge back to the skilled nursing facility.

## 2012-11-03 NOTE — Progress Notes (Signed)
[facility] PROGRESS NOTE  [name] ZOX:[id number] DOB: 1930/03/24 DOA: 11/02/2012 PCP: Catalina Pizza, MD  Assessment/Plan: Principal Problem:   Nausea and vomiting: etiology uncertain. Denies abdominal pain/nausea but information from patient may be unreliable due to dementia. Will advance diet and evaluate. Abdominal exam benign. Pt with recent hx of UTI. Will get in and out cath for better specimen for urinalysis to rule out UTI.   Active Problems: Elevated troponin: chart review indicates chronic elevated troponin: Elevated troponin: Seen 10/2011 (3.38-3.65), 11/2011 (3.60-3.96),01/2012 (3.62-3.83). Elevation of troponin is chronic in nature, previous comments from cardiology suggested that this is not reflective of an acute cardiac event. Previously discussed with family, no further investigation. Patient is not an interventional candidate regardless. Continue beta blocker therapy, ARB. Not on aspirin secondary to history of GI bleed and patient already on Xarelto.      HYPERTENSION: fair control. Will continue home meds    DIASTOLIC DYSFUNCTION chronic. Echo in 10/2011 yields a pattern of mild LVH. Systolic function was normal. The estimated ejection fraction was in the range of 55% to 60%. Indeterminant diastolic function. Currently compensated. Continue intake and output. Daily weight  DM: poor control. Will continue home lantus and use SSI until po intake more reliable. A1C 7.5 last month.     CAD (coronary artery disease): no chest pain. No events on tele. Continue home meds  Atrial fibrillation: rate controlled. Continue BB    Code Status: DNR Family Communication:  Disposition Plan: back to facility hopefully this afternoon   Consultants:  none  Procedures:  none  Antibiotics:  none  HPI/Subjective: Awake alert. Oriented to self only. Unable to make needs known. Denies pain/nausea  Objective: Filed Vitals:   11/02/12 2200 11/02/12 2313 11/03/12 0203  11/03/12 0527  BP: 156/83 137/69 172/97 159/89  Pulse: 82 77 88 93  Temp:  98.2 F (36.8 C) 98.1 F (36.7 C) 98.4 F (36.9 C)  TempSrc:  Oral Oral Oral  Resp: 17 18 18 18   Height:  5\' 4"  (1.626 m)    Weight:  125 lb 10.6 oz (57 kg)    SpO2: 99% 98% 96% 97%    Intake/Output Summary (Last 24 hours) at 11/03/12 0834 Last data filed at 11/03/12 0600  Gross per 24 hour  Intake 1266.25 ml  Output     50 ml  Net 1216.25 ml   Filed Weights   11/02/12 2313  Weight: 125 lb 10.6 oz (57 kg)    Exam:   General:  Well nourished NAD  Cardiovascular: irrregularly irregular No MGR No LE edema  Respiratory: normal effort BS clear bilaterally no wheeze no rhonchi  Abdomen: soft +BS non-tender to palpation no mass no guarding  Musculoskeletal: no clubbing no cyanosis   Data Reviewed: Basic Metabolic Panel:  Recent Labs Lab 11/02/12 1718 11/03/12 0658  NA 134* 136  K 4.2 3.9  CL 96 101  CO2 28 26  GLUCOSE 198* 197*  BUN 15 9  CREATININE 0.65 0.57  CALCIUM 10.2 9.5   Liver Function Tests:  Recent Labs Lab 11/02/12 1718  AST 17  ALT 10  ALKPHOS 84  BILITOT 0.3  PROT 7.6  ALBUMIN 3.4*    Recent Labs Lab 11/02/12 1718  LIPASE 14   No results found for this basename: AMMONIA,  in the last 168 hours CBC:  Recent Labs Lab 11/02/12 1718 11/03/12 0454  WBC 13.1* 11.7*  NEUTROABS 9.0*  --   HGB 12.1 12.3  HCT 36.4 37.1  MCV 91.0 91.4  PLT 568* 372   Cardiac Enzymes:  Recent Labs Lab 11/02/12 1718 11/02/12 1911  TROPONINI 0.83* 0.84*   BNP (last 3 results) No results found for this basename: PROBNP,  in the last 8760 hours CBG:  Recent Labs Lab 11/02/12 2116 11/03/12 0209 11/03/12 0725  GLUCAP 217* 189* 193*    No results found for this or any previous visit (from the past 240 hour(s)).   Studies: Dg Abd Acute W/chest  11/02/2012   *RADIOLOGY REPORT*  Clinical Data: Nausea with emesis.  ACUTE ABDOMEN SERIES (ABDOMEN 2 VIEW & CHEST 1 VIEW)   Comparison: Portable chest 10/07/2012.  Portable abdomen 10/11/2012.  Findings: There is stable cardiomegaly status post CABG.  The lungs are clear.  There is no pleural effusion or pneumothorax.  The bowel gas pattern is normal.  There is no free intraperitoneal air.  Scattered vascular calcifications are noted.  There is mild lumbar spondylosis associated with a convex left scoliosis.  IMPRESSION: No acute cardiopulmonary or abdominal process.   Original Report Authenticated By: Carey Bullocks, M.D.    Scheduled Meds: . sodium chloride   Intravenous STAT  . colchicine  0.6 mg Oral Daily  . docusate sodium  100 mg Oral Daily  . insulin aspart  0-5 Units Subcutaneous QHS  . insulin aspart  0-9 Units Subcutaneous TID WC  . irbesartan  300 mg Oral Daily  . metoprolol tartrate  12.5 mg Oral BID  . pantoprazole  40 mg Oral Daily  . QUEtiapine  12.5 mg Oral QHS  . Rivaroxaban  20 mg Oral Daily   Continuous Infusions: . 0.9 % NaCl with KCl 20 mEq / L 100 mL/hr at 11/03/12 0154    Time spent: 35 nubytes    [facility] M  [facility] Pager [phone]. If 7PM-7AM, please contact night-coverage at [email or web address], password [facility] 11/03/2012, 8:34 AM  LOS: 1 day   Attending: Patient seen and examined. Hopefully she can be discharged later on today.

## 2012-11-07 ENCOUNTER — Emergency Department (HOSPITAL_COMMUNITY): Payer: Medicare Other

## 2012-11-07 ENCOUNTER — Encounter (HOSPITAL_COMMUNITY): Payer: Self-pay | Admitting: *Deleted

## 2012-11-07 ENCOUNTER — Emergency Department (HOSPITAL_COMMUNITY)
Admission: EM | Admit: 2012-11-07 | Discharge: 2012-11-08 | Disposition: A | Discharge status: Skilled Nursing Facility | Payer: Medicare Other | Attending: Emergency Medicine | Admitting: Emergency Medicine

## 2012-11-07 DIAGNOSIS — E1069 Type 1 diabetes mellitus with other specified complication: Secondary | ICD-10-CM | POA: Insufficient documentation

## 2012-11-07 DIAGNOSIS — I2510 Atherosclerotic heart disease of native coronary artery without angina pectoris: Secondary | ICD-10-CM | POA: Insufficient documentation

## 2012-11-07 DIAGNOSIS — Z8639 Personal history of other endocrine, nutritional and metabolic disease: Secondary | ICD-10-CM | POA: Insufficient documentation

## 2012-11-07 DIAGNOSIS — Z8673 Personal history of transient ischemic attack (TIA), and cerebral infarction without residual deficits: Secondary | ICD-10-CM | POA: Insufficient documentation

## 2012-11-07 DIAGNOSIS — Z794 Long term (current) use of insulin: Secondary | ICD-10-CM | POA: Insufficient documentation

## 2012-11-07 DIAGNOSIS — R739 Hyperglycemia, unspecified: Secondary | ICD-10-CM

## 2012-11-07 DIAGNOSIS — Z8679 Personal history of other diseases of the circulatory system: Secondary | ICD-10-CM | POA: Insufficient documentation

## 2012-11-07 DIAGNOSIS — I4891 Unspecified atrial fibrillation: Secondary | ICD-10-CM | POA: Insufficient documentation

## 2012-11-07 DIAGNOSIS — I252 Old myocardial infarction: Secondary | ICD-10-CM | POA: Insufficient documentation

## 2012-11-07 DIAGNOSIS — Z951 Presence of aortocoronary bypass graft: Secondary | ICD-10-CM | POA: Insufficient documentation

## 2012-11-07 DIAGNOSIS — K219 Gastro-esophageal reflux disease without esophagitis: Secondary | ICD-10-CM | POA: Insufficient documentation

## 2012-11-07 DIAGNOSIS — F0390 Unspecified dementia without behavioral disturbance: Secondary | ICD-10-CM | POA: Insufficient documentation

## 2012-11-07 DIAGNOSIS — Z9861 Coronary angioplasty status: Secondary | ICD-10-CM | POA: Insufficient documentation

## 2012-11-07 DIAGNOSIS — I10 Essential (primary) hypertension: Secondary | ICD-10-CM | POA: Insufficient documentation

## 2012-11-07 DIAGNOSIS — F329 Major depressive disorder, single episode, unspecified: Secondary | ICD-10-CM | POA: Insufficient documentation

## 2012-11-07 DIAGNOSIS — Z79899 Other long term (current) drug therapy: Secondary | ICD-10-CM | POA: Insufficient documentation

## 2012-11-07 DIAGNOSIS — F3289 Other specified depressive episodes: Secondary | ICD-10-CM | POA: Insufficient documentation

## 2012-11-07 DIAGNOSIS — Z862 Personal history of diseases of the blood and blood-forming organs and certain disorders involving the immune mechanism: Secondary | ICD-10-CM | POA: Insufficient documentation

## 2012-11-07 DIAGNOSIS — Z8669 Personal history of other diseases of the nervous system and sense organs: Secondary | ICD-10-CM | POA: Insufficient documentation

## 2012-11-07 DIAGNOSIS — M109 Gout, unspecified: Secondary | ICD-10-CM | POA: Insufficient documentation

## 2012-11-07 DIAGNOSIS — Z8719 Personal history of other diseases of the digestive system: Secondary | ICD-10-CM | POA: Insufficient documentation

## 2012-11-07 LAB — CBC WITH DIFFERENTIAL/PLATELET
Basophils Absolute: 0 10*3/uL (ref 0.0–0.1)
Basophils Relative: 0 % (ref 0–1)
Eosinophils Relative: 2 % (ref 0–5)
HCT: 39.6 % (ref 36.0–46.0)
Lymphocytes Relative: 34 % (ref 12–46)
MCHC: 33.8 g/dL (ref 30.0–36.0)
MCV: 90.2 fL (ref 78.0–100.0)
Monocytes Absolute: 1.4 10*3/uL — ABNORMAL HIGH (ref 0.1–1.0)
Neutro Abs: 7.3 10*3/uL (ref 1.7–7.7)
Platelets: 511 10*3/uL — ABNORMAL HIGH (ref 150–400)
RDW: 14.2 % (ref 11.5–15.5)
WBC: 13.5 10*3/uL — ABNORMAL HIGH (ref 4.0–10.5)

## 2012-11-07 LAB — BASIC METABOLIC PANEL
CO2: 26 mEq/L (ref 19–32)
Calcium: 10 mg/dL (ref 8.4–10.5)
Creatinine, Ser: 0.65 mg/dL (ref 0.50–1.10)
GFR calc Af Amer: >90 mL/min (ref >90)
Sodium: 136 mEq/L (ref 135–145)

## 2012-11-07 LAB — GLUCOSE, CAPILLARY: Glucose-Capillary: 204 mg/dL — ABNORMAL HIGH (ref 70–99)

## 2012-11-07 NOTE — ED Notes (Signed)
Pt arrived by EMS from [facility], called out for cva. Pt responding to verbal stimuli has a history of dementia. Pt trying to bite EMS when moved to stretcher, staff states she will try to bite.

## 2012-11-07 NOTE — ED Notes (Signed)
Pt resting calmly w/ eyes closed. Rise & fall of the chest noted. Bed in low position, side rails up x2. NAD noted at this time. Pt remains on cardiac monitor w/ NIBP vital signs WNL. NAD noted. No needs voiced at this time.

## 2012-11-07 NOTE — ED Notes (Signed)
Report called back to [facility] & was advised they would be sending someone out to pick up pt.

## 2012-11-07 NOTE — ED Provider Notes (Signed)
CSN: [id number]     Arrival date & time 11/07/12  2149 History     First MD Initiated Contact with Patient 11/07/12 2255     Chief Complaint  Patient presents with  . Altered Mental Status   (Consider location/radiation/quality/duration/timing/severity/associated sxs/prior Treatment) HPI Caveat 5 due to dementia HPI Comments: [name] is a 79 y.o. female brought in by ambulance, who presents to the Emergency Department with a change in behavior, kicking, yelling, biting. EMS picked the patient up and they were bitten. She is currently calm. She is non verbal to questions.    PCP Dr. [name] Past Medical History  Diagnosis Date  . HTN (hypertension)   . Coronary atherosclerosis of unspecified type of vessel, native or graft     a. remote CABG in 1999 with Dr. Laneta Simmers. b. s/p PCI with stent to LCX in 2009 (Platinum protocol study). c. NSTEMI 10/2011 s/p BMS to SVG->diagonal.  . Personal history of other diseases of digestive system   . Gout, unspecified     "have had it in my hands and my feet"  . Unspecified hereditary and idiopathic peripheral neuropathy   . Esophageal reflux   . Anemia   . GI bleed   . Leukocytosis 03/16/2011  . Hyperlipidemia     Statin intolerant  . Pancreas disorder     prior pancreatectomy in 2011 complicated by leak requiring prolong and repeated drainage and subsequent pancreatic stent  . Other chronic pulmonary heart diseases   . Type I (juvenile type) diabetes mellitus without mention of complication, not stated as uncontrolled     Previously on insulin pump  . Permanent atrial fibrillation   . Anginal pain 11/24/11    "1st time"  . Exertional dyspnea 11/24/11  . Depression   . 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 CLL (chronic lymphocytic leukemia)     seeing Dr. [name]  . Myocardial infarction 1999    "on the verge of one"  . NSTEMI (non-ST elevated myocardial infarction) 10/2011    /H&P  . Stroke   . Atrial  fibrillation   . Depression   . Acid reflux   . Dementia    Past Surgical History  Procedure Laterality Date  . Oophorectomy  1970's    w hysterectomy and bladder tacking procedure  . Belpharoptosis repair      right upper eyelid  . Esophagogastroduodenoscopy  07/11/2010  . Colonoscopy  10/20/06  . Tonsillectomy  1947  . Appendectomy  1970's?  . Abdominal hysterectomy  1970's  . Coronary angioplasty with stent placement  10/2011    "makes total of 4"; 20-30% ostial left main stenosis; occluded ostial LAD supplied by IMA graft, 20-30% scattered LCx stenoses with widely patent stent and 20% narrowing; diffuse RCA luminal irregularities up to 20-30%; SVG-diagonal patent, but proximal 90% stenosis s/p BMS; patent LIMA-LAD; LVEF 55-65%.  . Coronary angioplasty with stent placement  2009    LCX  . Cataract extraction w/ intraocular lens  implant, bilateral  ~ 2003  . Coronary artery bypass graft  1999    LIMA to LAD, SVG to the DX  . Pancreatectomy  2011    c/b leak requiring multiple drainage attempts and stent placement), /H&P  . Cardiac surgery    . Colonoscopy  01/20/2012    Procedure: COLONOSCOPY;  Surgeon: Malissa Hippo, MD;  Location: [facility];  Service: Endoscopy;  Laterality: N/A;   Family History  Problem Relation Age of Onset  .  Stroke Mother   . Lung cancer Father   . Healthy Son    History  Substance Use Topics  . Smoking status: Never Smoker   . Smokeless tobacco: Never Used  . Alcohol Use: No   OB History   Grav Para Term Preterm Abortions TAB SAB Ect Mult Living                 Review of Systems  Unable to perform ROS: Dementia    Allergies  Codeine; Ivp dye; Morphine; Prednisone; and Statins  Home Medications   Current Outpatient Rx  Name  Route  Sig  Dispense  Refill  . acetaminophen (TYLENOL) 325 MG tablet   Oral   Take 650 mg by mouth 2 (two) times daily.         . cephALEXin (KEFLEX) 250 MG capsule   Oral   Take 250 mg by mouth at  bedtime.         . Cholecalciferol (VITAMIN D) 2000 UNITS CAPS   Oral   Take 2 capsules by mouth daily.         . colchicine 0.6 MG tablet   Oral   Take 0.6 mg by mouth daily. Gout Flare         . dextrose (INSTA-GLUCOSE) 40 % GEL   Oral   Take 1 Tube by mouth once as needed (Use if blood sugar is 60-to recheck after 15 minutes).         Marland Kitchen docusate sodium (COLACE) 100 MG capsule   Oral   Take 100 mg by mouth daily.          . insulin aspart (NOVOLOG) 100 UNIT/ML injection   Subcutaneous   Inject 10-12 Units into the skin 2 (two) times daily. Patient takes 10 units before lunch and 12 units before supper         . insulin glargine (LANTUS) 100 UNIT/ML injection   Subcutaneous   Inject 40 Units into the skin at bedtime.          Marland Kitchen LORazepam (ATIVAN) 0.5 MG tablet   Oral   Take 1 tablet (0.5 mg total) by mouth 2 (two) times daily as needed for anxiety (agitation).   10 tablet   0   . magnesium oxide (MAG-OX) 400 MG tablet   Oral   Take 400 mg by mouth 3 (three) times daily.          . metoprolol tartrate (LOPRESSOR) 25 MG tablet   Oral   Take 12.5 mg by mouth 2 (two) times daily.          . Multiple Vitamin (DAILY VITE PO)   Oral   Take 1 tablet by mouth daily.         Marland Kitchen olmesartan (BENICAR) 40 MG tablet   Oral   Take 40 mg by mouth daily.         Marland Kitchen omeprazole (PRILOSEC) 20 MG capsule   Oral   Take 20 mg by mouth daily.         . polyethylene glycol (MIRALAX / GLYCOLAX) packet   Oral   Take 17 g by mouth daily.         . QUEtiapine (SEROQUEL) 25 MG tablet   Oral   Take 0.5 tablets (12.5 mg total) by mouth at bedtime.         . Rivaroxaban (XARELTO) 20 MG TABS   Oral   Take 20 mg by mouth daily.         Marland Kitchen  traMADol (ULTRAM) 50 MG tablet   Oral   Take 50 mg by mouth 3 (three) times daily.         . vitamin C (ASCORBIC ACID) 500 MG tablet   Oral   Take 500 mg by mouth 2 (two) times daily.         . nitroGLYCERIN  (NITROSTAT) 0.4 MG SL tablet   Sublingual   Place 1 tablet (0.4 mg total) under the tongue every 5 (five) minutes x 3 doses as needed for chest pain.   25 tablet   4    BP 150/76  Pulse 79  Temp(Src) 98.5 F (36.9 C) (Oral)  Resp 22  Wt 130 lb (58.968 kg)  BMI 22.3 kg/m2  SpO2 96% Physical Exam  Nursing note and vitals reviewed. Constitutional: She appears well-developed and well-nourished.  Awake, alert, nontoxic appearance.  HENT:  Head: Normocephalic and atraumatic.  Eyes: EOM are normal. Pupils are equal, round, and reactive to light.  Neck: Neck supple.  Cardiovascular: Intact distal pulses.   Irregularly irregular  Pulmonary/Chest: Effort normal and breath sounds normal. She exhibits no tenderness.  Abdominal: Soft. Bowel sounds are normal. There is no tenderness. There is no rebound.  Musculoskeletal: She exhibits no tenderness.  Baseline ROM, no obvious new focal weakness.  Neurological:  Mental status and motor strength appears baseline for patient and situation.MAE, follows commands to squeeze my hand, move the foot.   Skin: No rash noted.  Psychiatric: She has a normal mood and affect.    ED Course   Procedures (including critical care time) Results for orders placed during the hospital encounter of 11/07/12  CBC WITH DIFFERENTIAL      Result Value Range   WBC 13.5 (*) 4.0 - 10.5 K/uL   RBC 4.39  3.87 - 5.11 MIL/uL   Hemoglobin 13.4  12.0 - 15.0 g/dL   HCT 16.1  09.6 - 04.5 %   MCV 90.2  78.0 - 100.0 fL   MCH 30.5  26.0 - 34.0 pg   MCHC 33.8  30.0 - 36.0 g/dL   RDW 40.9  81.1 - 91.4 %   Platelets 511 (*) 150 - 400 K/uL   Neutrophils Relative % 54  43 - 77 %   Neutro Abs 7.3  1.7 - 7.7 K/uL   Lymphocytes Relative 34  12 - 46 %   Lymphs Abs 4.6 (*) 0.7 - 4.0 K/uL   Monocytes Relative 11  3 - 12 %   Monocytes Absolute 1.4 (*) 0.1 - 1.0 K/uL   Eosinophils Relative 2  0 - 5 %   Eosinophils Absolute 0.2  0.0 - 0.7 K/uL   Basophils Relative 0  0 - 1 %    Basophils Absolute 0.0  0.0 - 0.1 K/uL  BASIC METABOLIC PANEL      Result Value Range   Sodium 136  135 - 145 mEq/L   Potassium 3.4 (*) 3.5 - 5.1 mEq/L   Chloride 98  96 - 112 mEq/L   CO2 26  19 - 32 mEq/L   Glucose, Bld 229 (*) 70 - 99 mg/dL   BUN 15  6 - 23 mg/dL   Creatinine, Ser 7.82  0.50 - 1.10 mg/dL   Calcium 95.6  8.4 - 21.3 mg/dL   GFR calc non Af Amer 81 (*) >90 mL/min   GFR calc Af Amer >90  >90 mL/min  GLUCOSE, CAPILLARY      Result Value Range   Glucose-Capillary 204 (*)  70 - 99 mg/dL    Dg Chest Portable 1 View  11/07/2012   *RADIOLOGY REPORT*  Clinical Data: Altered mental status.  PORTABLE CHEST - 1 VIEW  Comparison: 11/02/2012 acute abdominal series.  The chest radiograph from 2013.  Findings: Cardiopericardial silhouette is enlarged, that and this is a chronic finding.  Median sternotomy.  Tortuous thoracic aorta. No airspace disease.  No effusion.  The Monitoring leads are projected over the chest. No pneumothorax.  There is some hazy opacity over the right upper lobe which appears to represent the overlapping right scapula and first rib.  The appearance is similar to 11/02/2012.  IMPRESSION: Cardiomegaly without failure.  Postoperative changes of the chest likely representing CABG.   Original Report Authenticated By: Andreas Newport, M.D.    Date: 11/07/2012   2155  Rate:74  Rhythm: atrial fibrillation  QRS Axis: normal  Intervals: atrial fib  ST/T Wave abnormalities: normal  Conduction Disutrbances:none  Narrative Interpretation:   Old EKG Reviewed: unchanged c/w 11/02/12   MDM  Patient here with EMS with c/o change in behavior. She is demented. Labs are essentially unremarkable. Blood sugar is elevated at 229. Chest xray is normal. EKG with atrial fibrillation unchanged. Patient will be sent back to [facility]. Pt stable in ED with no significant deterioration in condition.The patient appears reasonably screened and/or stabilized for discharge and I doubt any  other medical condition or other [facility] requiring further screening, evaluation, or treatment in the ED at this time prior to discharge.  MDM Reviewed: nursing note and vitals Interpretation: labs and ECG     Nicoletta Dress. Colon Branch, MD 11/07/12 2326

## 2012-11-07 NOTE — ED Notes (Signed)
Pt able to take a couple of sips of water as per EDP. Pt answering simple questions w/ yes & no answers.

## 2013-10-20 NOTE — Telephone Encounter (Signed)
Close encounter 

## 2013-11-20 IMAGING — CT CT CERVICAL SPINE W/O CM
4 of 6 series · 13 of 33 positions shown, 15 images · non-contrast
Comparison: 05/12/2012

CT HEAD

CLINICAL DATA: Fall and right sided head pain.

CT HEAD WITHOUT CONTRAST
CT CERVICAL SPINE WITHOUT CONTRAST
TECHNIQUE: Multidetector CT imaging of the head and cervical spine
was performed following the standard protocol without intravenous
contrast.  Multiplanar CT image reconstructions of the cervical
spine were also generated.

[Series 5: cervical st 2.0 b31s · axial · 0.28mm/px · z∈[+62,+160]mm · 4 of 83 slices shown, 5 images]
[im 17/83  soft-tissue]
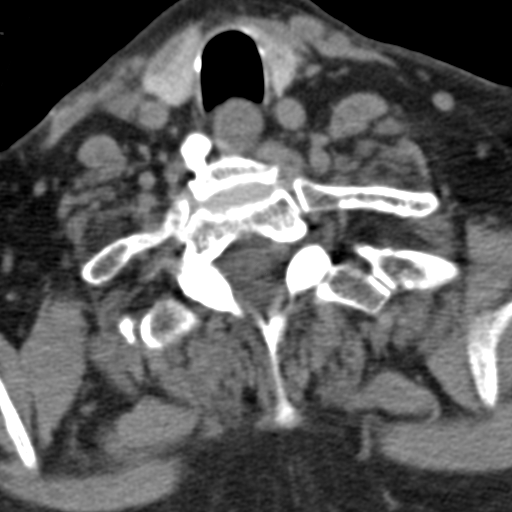
[im 17/83  bone]
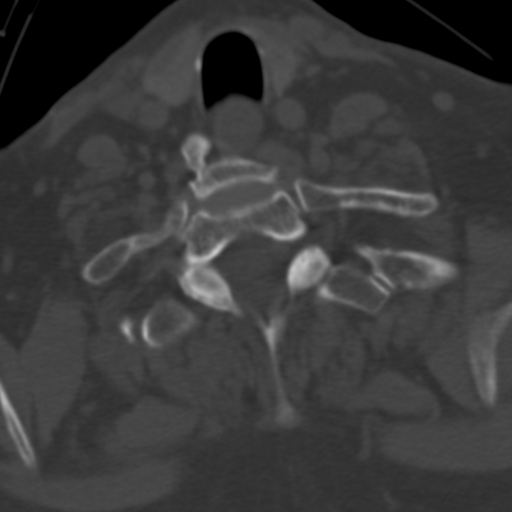
[im 33/83  bone]
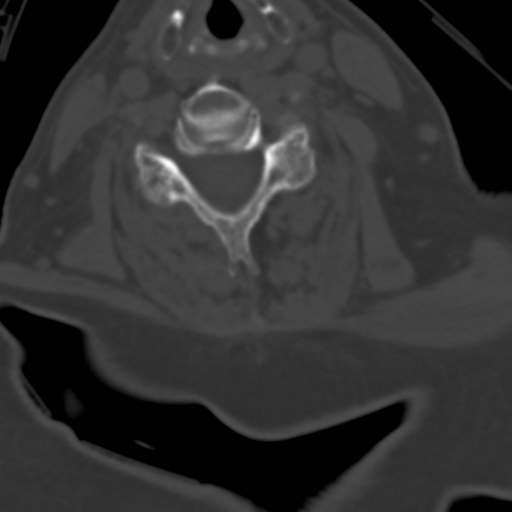
[im 50/83  bone]
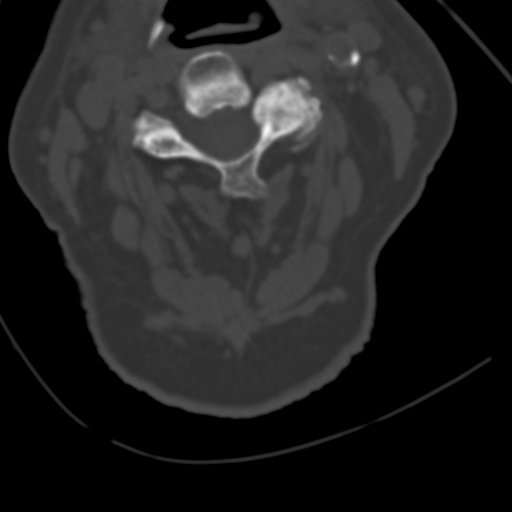
[im 66/83  bone]
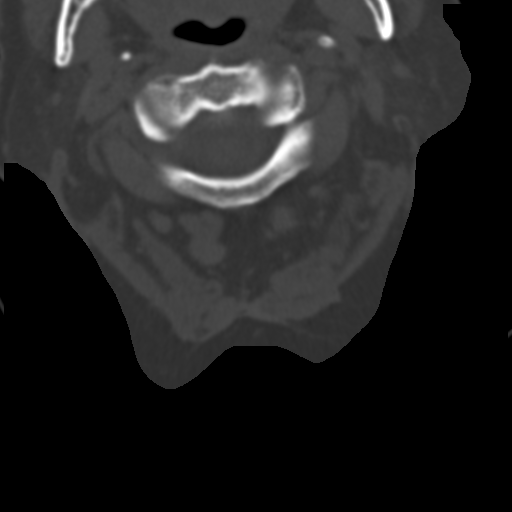

[Series 7: sagittal bone 2.0 · sagittal · 0.24mm/px · 5 of 59 slices shown, 6 images]
[im 20/59  bone]
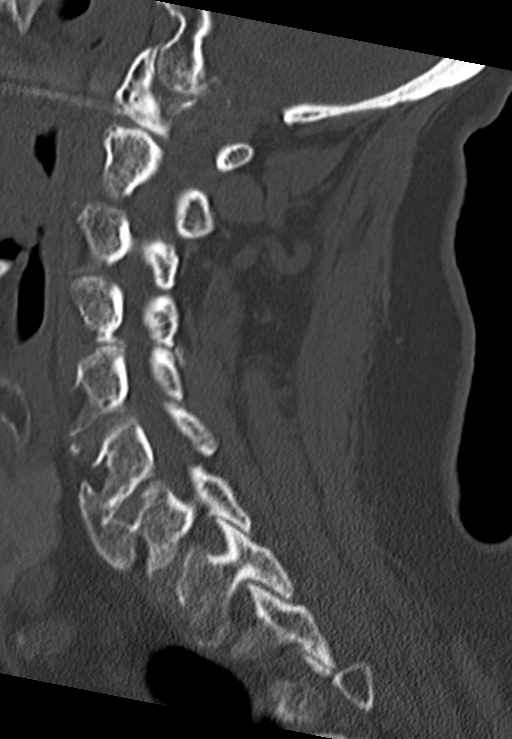
[im 25/59  bone]
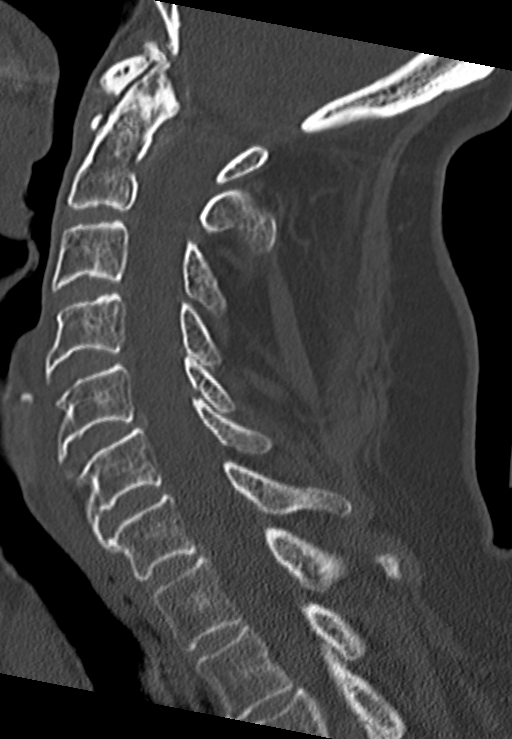
[im 30/59  soft-tissue]
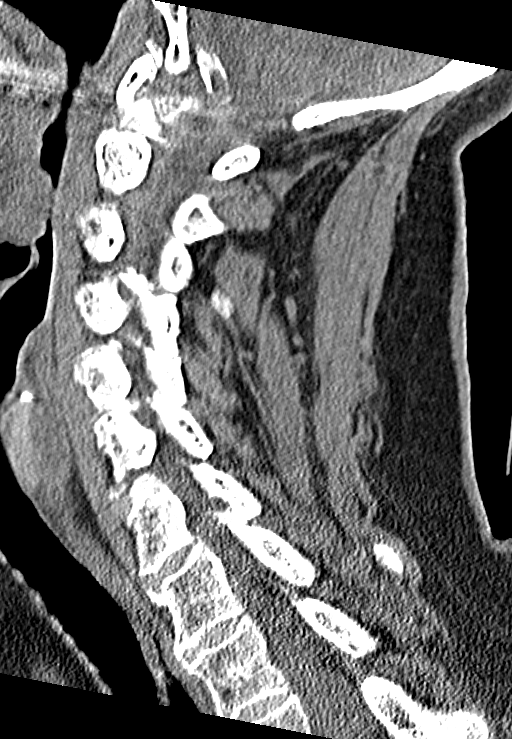
[im 30/59  bone]
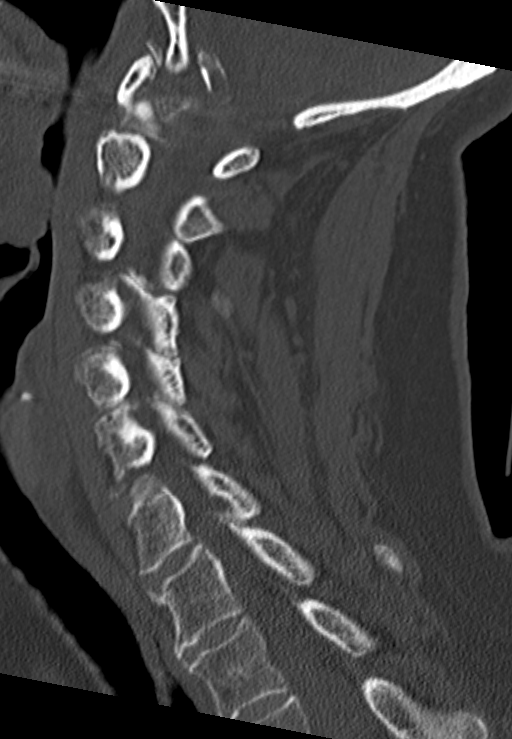
[im 34/59  bone]
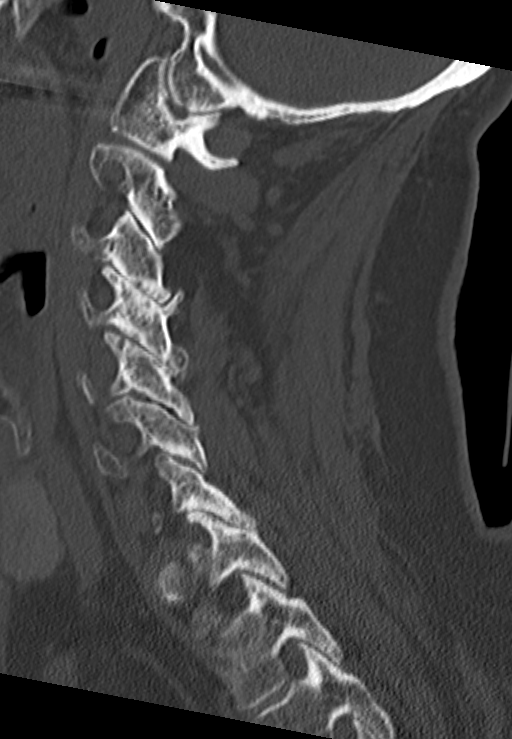
[im 39/59  bone]
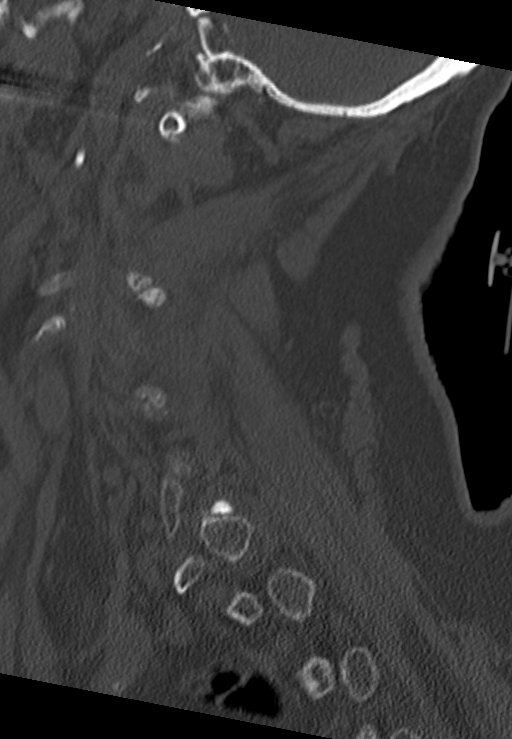

[Series 9: coronal bone 2.0 · coronal · 0.19mm/px · 1 of 48 slices shown]
[im 24/48  bone]
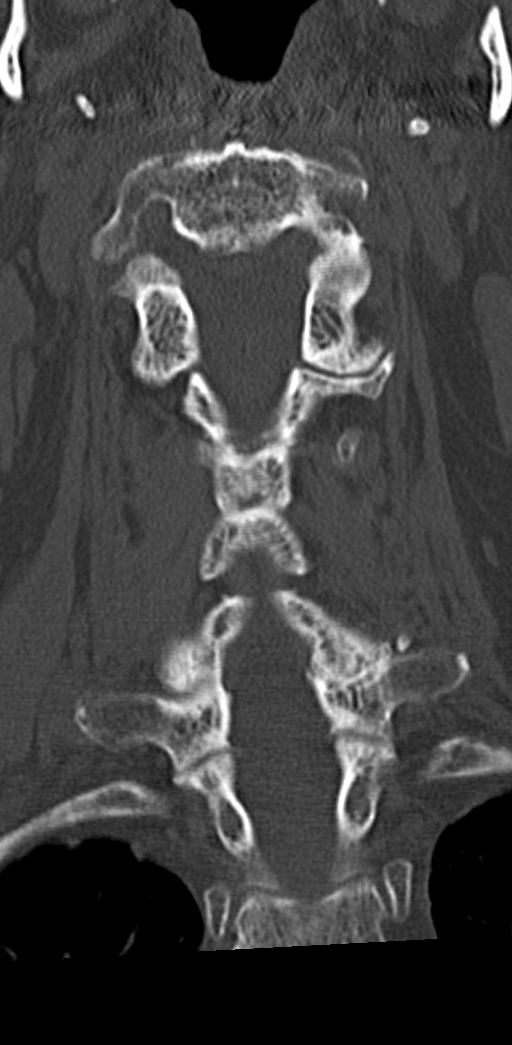

[Series 10: axial bone 2.0 · axial · 0.21mm/px · z∈[+53,+123]mm · 3 of 92 slices shown]
[im 19/92  bone]
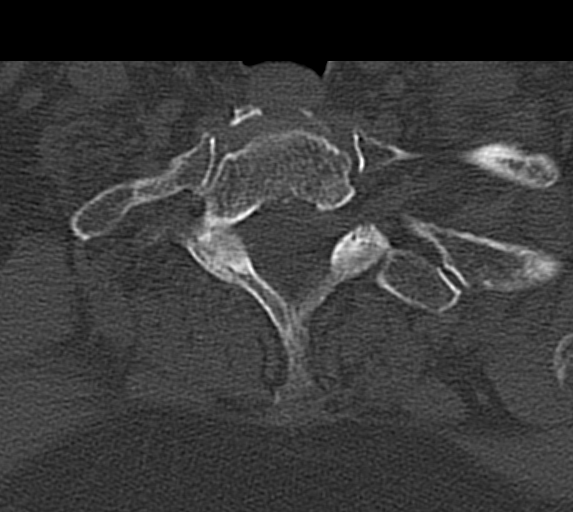
[im 37/92  bone]
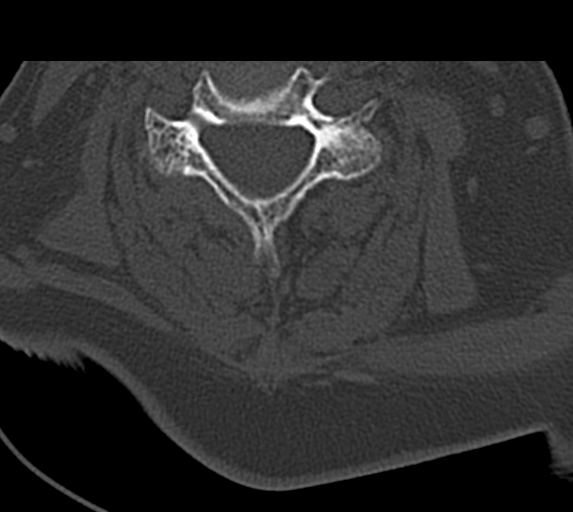
[im 55/92  bone]
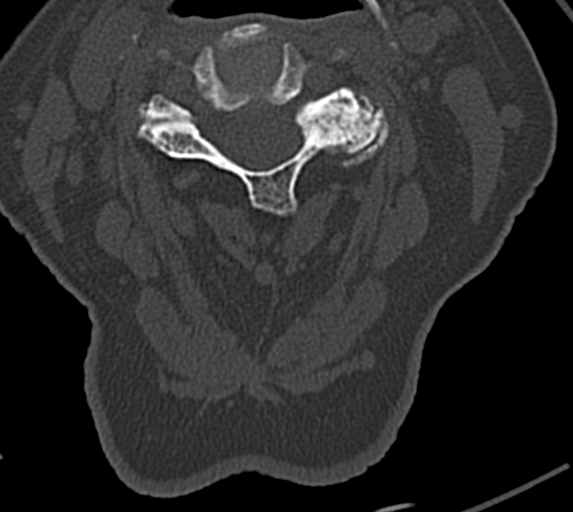

[13 of 33 positions shown; findings below may reference images not displayed]

FINDINGS: There is stable encephalomalacia in the right frontal
lobe as well as the inferior medial right cerebellum.  No evidence
for acute hemorrhage, mass lesion, midline shift or hydrocephalus.
No evidence for a new large infarct.  There is some mucosal disease
or fluid in the left maxillary sinus.  No acute bony abnormality.
IMPRESSION: No acute intracranial abnormality.

Old infarcts.

CT CERVICAL SPINE
FINDINGS: Scarring at the left lung apex.  No evidence for a
pneumothorax.  No acute fracture or dislocation.  No significant
soft tissue swelling.  Multilevel degenerative facet disease.
Normal alignment of the cervical spine.  Normal alignment at the
cervicothoracic junction.
IMPRESSION: No acute bony abnormality to the cervical spine.

Cervical spondylosis.

## 2013-12-01 IMAGING — CT CT HEAD W/O CM
1 of 2 series · 16 of 30 positions shown, 20 images · non-contrast
Comparison: August 08, 2012.

CLINICAL DATA: Altered level of consciousness.

CT HEAD WITHOUT CONTRAST
TECHNIQUE: Contiguous axial images were obtained from the base of
the skull through the vertex without contrast.

[Series 3: headtrauma 2.4 h60s · axial · 0.43mm/px · z∈[+104,+232]mm · 16 of 59 slices shown, 20 images]
[im 4/59  brain]
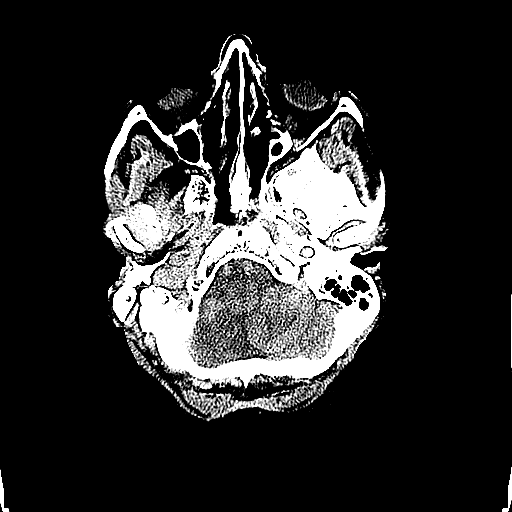
[im 4/59  bone]
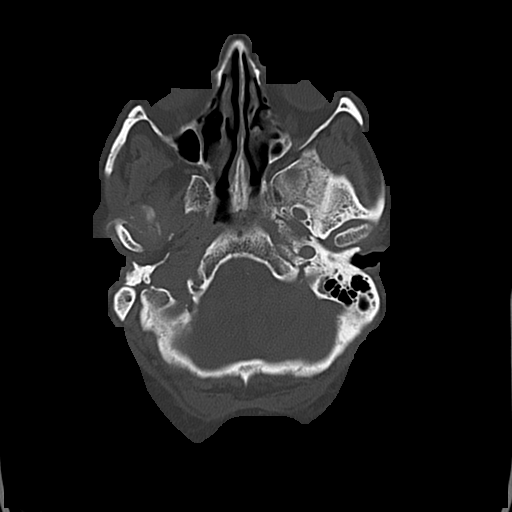
[im 7/59  brain]
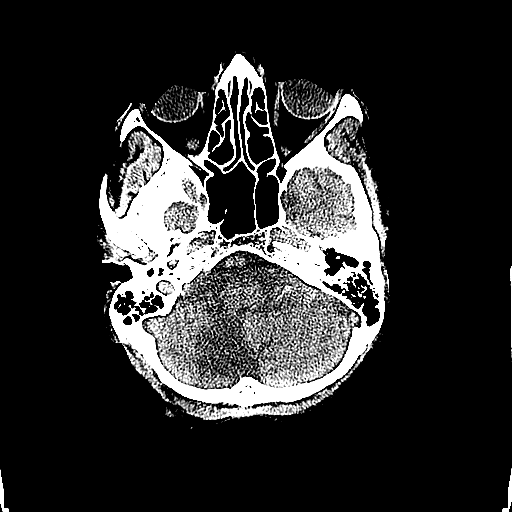
[im 10/59  brain]
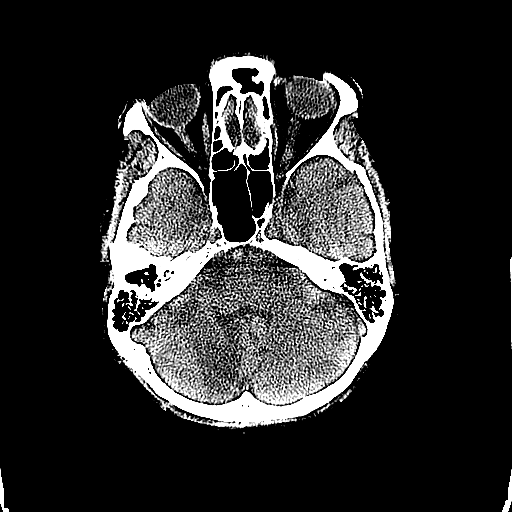
[im 13/59  brain]
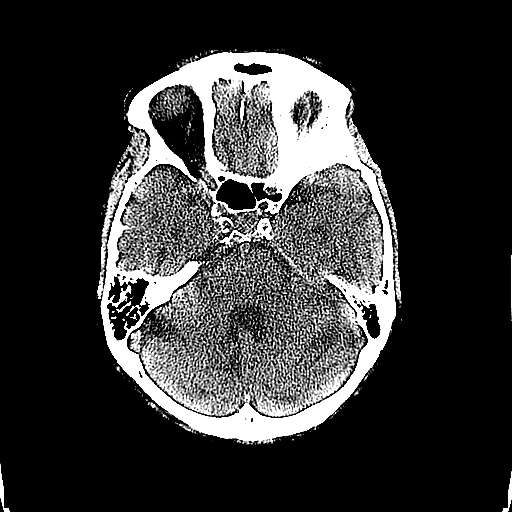
[im 19/59  brain]
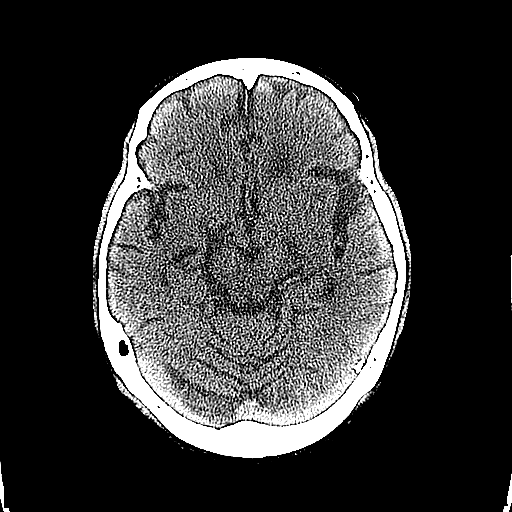
[im 19/59  bone]
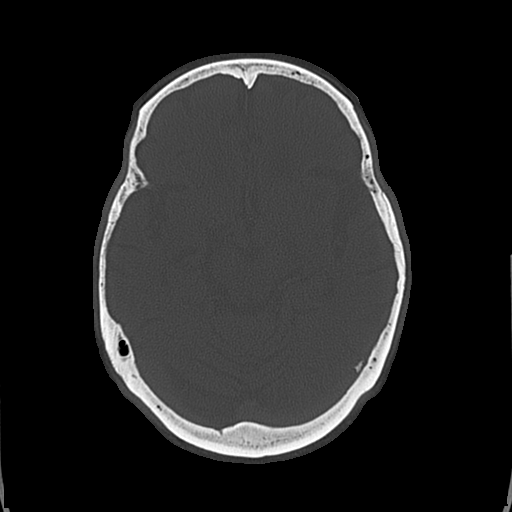
[im 22/59  brain]
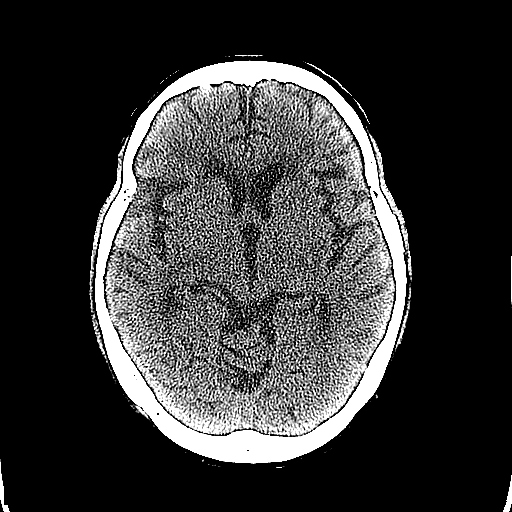
[im 25/59  brain]
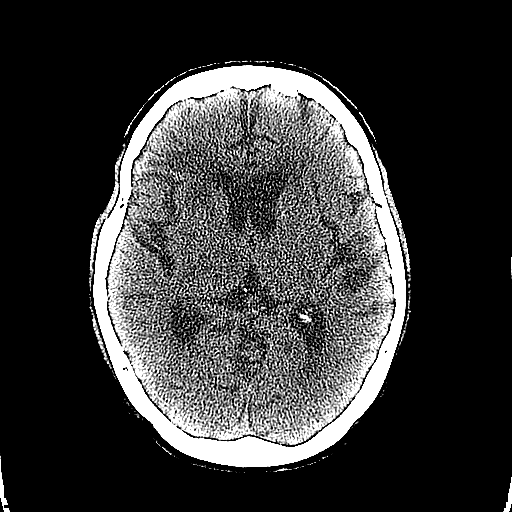
[im 28/59  brain]
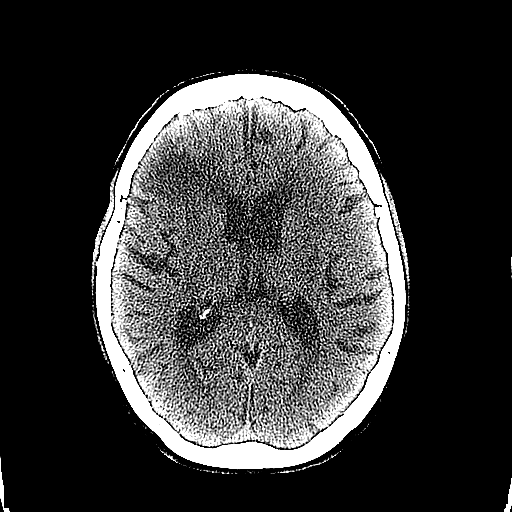
[im 31/59  brain]
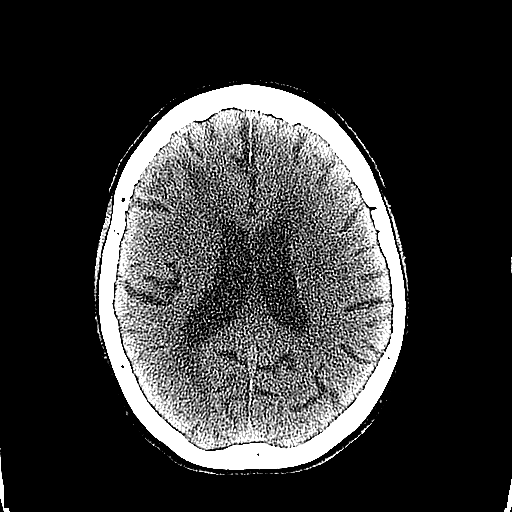
[im 31/59  bone]
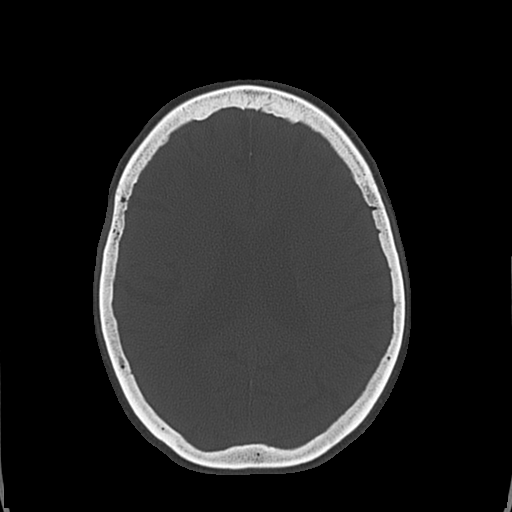
[im 34/59  brain]
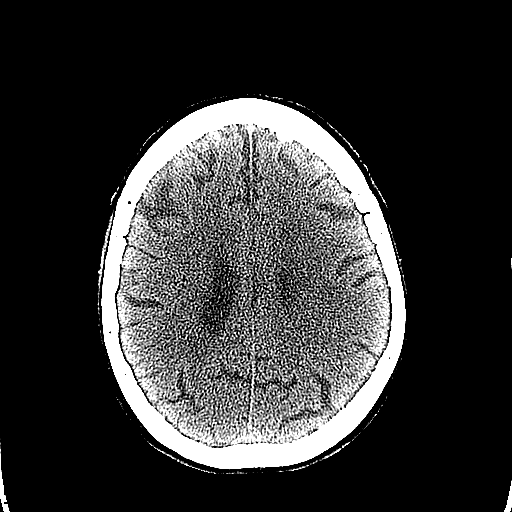
[im 37/59  brain]
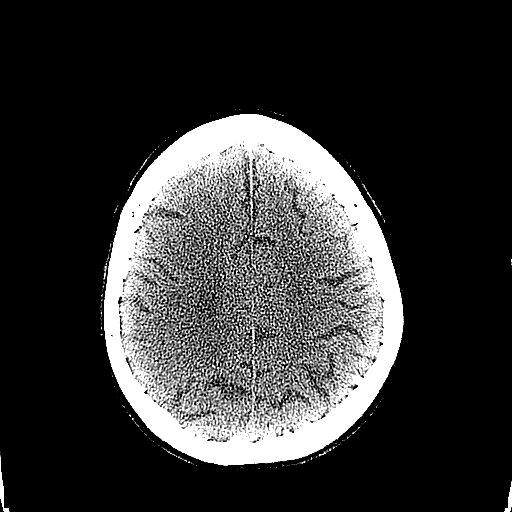
[im 40/59  brain]
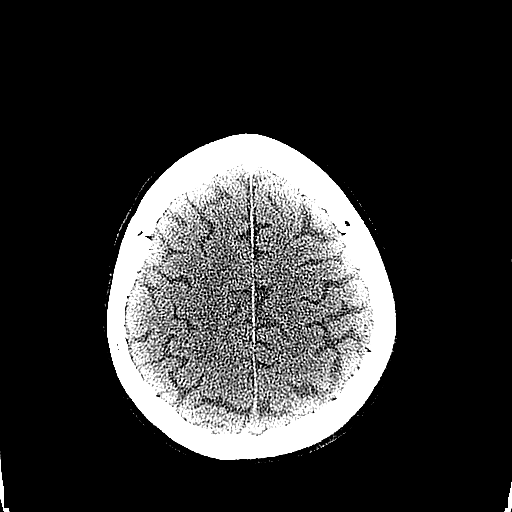
[im 46/59  brain]
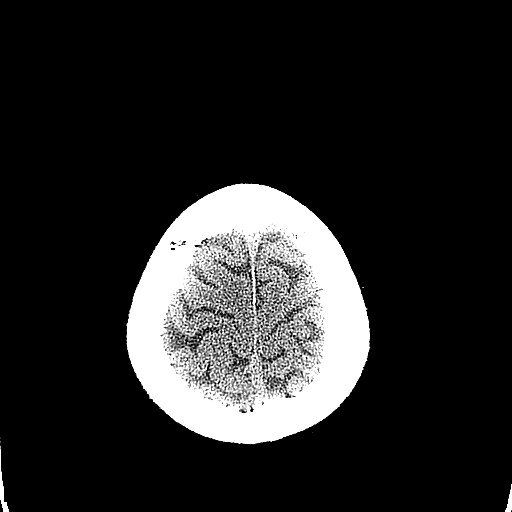
[im 46/59  bone]
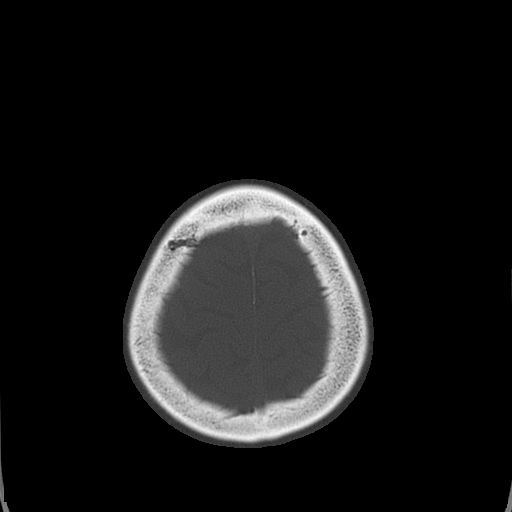
[im 49/59  brain]
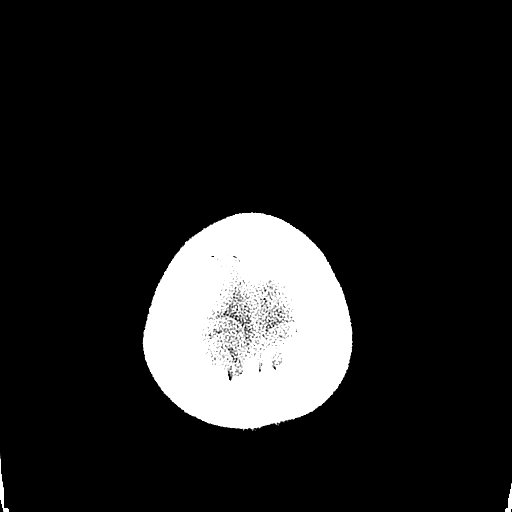
[im 52/59  brain]
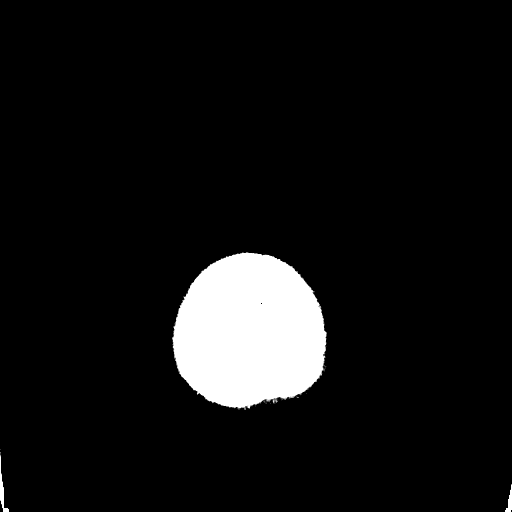
[im 55/59  brain]
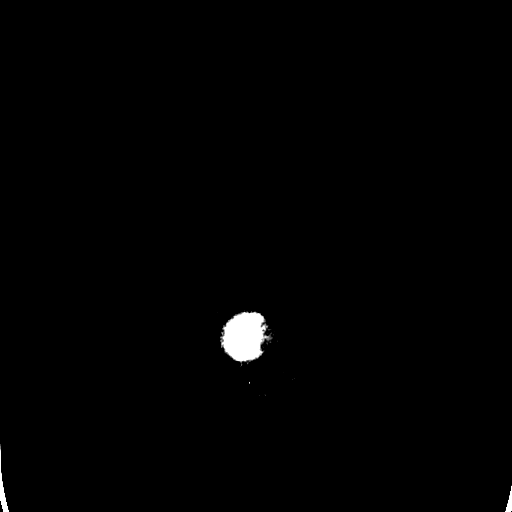

[16 of 30 positions shown; findings below may reference images not displayed]

FINDINGS: Bony calvarium is intact.  Old right frontal infarction
is noted.  Mild diffuse cortical atrophy is noted.  Old right
cerebellar infarction is noted.  No mass effect or midline shift is
noted.  Mild chronic ischemic white matter disease is noted.
Ventricular size is within normal limits.  There is no evidence of
mass lesion, hemorrhage or acute infarction.
IMPRESSION: Old right frontal and cerebellar infarctions. Chronic ischemic
white matter disease.  No acute intracranial abnormality seen.

## 2014-02-21 ENCOUNTER — Inpatient Hospital Stay (HOSPITAL_COMMUNITY)
Admission: EM | Admit: 2014-02-21 | Discharge: 2014-02-27 | DRG: 377 | Disposition: A | Discharge status: Skilled Nursing Facility | Payer: Medicare Other | Attending: Internal Medicine | Admitting: Internal Medicine

## 2014-02-21 ENCOUNTER — Encounter (HOSPITAL_COMMUNITY): Payer: Self-pay | Admitting: Emergency Medicine

## 2014-02-21 DIAGNOSIS — E1165 Type 2 diabetes mellitus with hyperglycemia: Secondary | ICD-10-CM | POA: Diagnosis present

## 2014-02-21 DIAGNOSIS — Z7901 Long term (current) use of anticoagulants: Secondary | ICD-10-CM | POA: Diagnosis not present

## 2014-02-21 DIAGNOSIS — Z66 Do not resuscitate: Secondary | ICD-10-CM | POA: Diagnosis present

## 2014-02-21 DIAGNOSIS — T45515A Adverse effect of anticoagulants, initial encounter: Secondary | ICD-10-CM | POA: Diagnosis present

## 2014-02-21 DIAGNOSIS — Z955 Presence of coronary angioplasty implant and graft: Secondary | ICD-10-CM

## 2014-02-21 DIAGNOSIS — D72829 Elevated white blood cell count, unspecified: Secondary | ICD-10-CM | POA: Diagnosis present

## 2014-02-21 DIAGNOSIS — K219 Gastro-esophageal reflux disease without esophagitis: Secondary | ICD-10-CM | POA: Diagnosis present

## 2014-02-21 DIAGNOSIS — Z951 Presence of aortocoronary bypass graft: Secondary | ICD-10-CM | POA: Diagnosis not present

## 2014-02-21 DIAGNOSIS — IMO0002 Reserved for concepts with insufficient information to code with codable children: Secondary | ICD-10-CM | POA: Diagnosis present

## 2014-02-21 DIAGNOSIS — R531 Weakness: Secondary | ICD-10-CM

## 2014-02-21 DIAGNOSIS — I4891 Unspecified atrial fibrillation: Secondary | ICD-10-CM | POA: Diagnosis present

## 2014-02-21 DIAGNOSIS — I482 Chronic atrial fibrillation: Secondary | ICD-10-CM | POA: Diagnosis present

## 2014-02-21 DIAGNOSIS — I2510 Atherosclerotic heart disease of native coronary artery without angina pectoris: Secondary | ICD-10-CM | POA: Diagnosis present

## 2014-02-21 DIAGNOSIS — E785 Hyperlipidemia, unspecified: Secondary | ICD-10-CM | POA: Diagnosis present

## 2014-02-21 DIAGNOSIS — N179 Acute kidney failure, unspecified: Secondary | ICD-10-CM | POA: Diagnosis present

## 2014-02-21 DIAGNOSIS — Z90710 Acquired absence of both cervix and uterus: Secondary | ICD-10-CM | POA: Diagnosis not present

## 2014-02-21 DIAGNOSIS — D62 Acute posthemorrhagic anemia: Secondary | ICD-10-CM | POA: Diagnosis present

## 2014-02-21 DIAGNOSIS — Z794 Long term (current) use of insulin: Secondary | ICD-10-CM

## 2014-02-21 DIAGNOSIS — I10 Essential (primary) hypertension: Secondary | ICD-10-CM | POA: Diagnosis present

## 2014-02-21 DIAGNOSIS — I252 Old myocardial infarction: Secondary | ICD-10-CM

## 2014-02-21 DIAGNOSIS — I639 Cerebral infarction, unspecified: Secondary | ICD-10-CM | POA: Diagnosis present

## 2014-02-21 DIAGNOSIS — D649 Anemia, unspecified: Secondary | ICD-10-CM

## 2014-02-21 DIAGNOSIS — Z8673 Personal history of transient ischemic attack (TIA), and cerebral infarction without residual deficits: Secondary | ICD-10-CM

## 2014-02-21 DIAGNOSIS — Z79899 Other long term (current) drug therapy: Secondary | ICD-10-CM | POA: Diagnosis not present

## 2014-02-21 DIAGNOSIS — G9340 Encephalopathy, unspecified: Secondary | ICD-10-CM | POA: Diagnosis present

## 2014-02-21 DIAGNOSIS — F0391 Unspecified dementia with behavioral disturbance: Secondary | ICD-10-CM | POA: Diagnosis present

## 2014-02-21 DIAGNOSIS — K922 Gastrointestinal hemorrhage, unspecified: Principal | ICD-10-CM | POA: Diagnosis present

## 2014-02-21 DIAGNOSIS — I519 Heart disease, unspecified: Secondary | ICD-10-CM | POA: Diagnosis present

## 2014-02-21 DIAGNOSIS — G609 Hereditary and idiopathic neuropathy, unspecified: Secondary | ICD-10-CM | POA: Diagnosis present

## 2014-02-21 DIAGNOSIS — Z856 Personal history of leukemia: Secondary | ICD-10-CM

## 2014-02-21 LAB — CBC WITH DIFFERENTIAL/PLATELET
Basophils Absolute: 0.1 10*3/uL (ref 0.0–0.1)
Basophils Relative: 1 % (ref 0–1)
EOS ABS: 0 10*3/uL (ref 0.0–0.7)
Eosinophils Relative: 0 % (ref 0–5)
HCT: 13.7 % — ABNORMAL LOW (ref 36.0–46.0)
Hemoglobin: 3.9 g/dL — CL (ref 12.0–15.0)
Lymphocytes Relative: 23 % (ref 12–46)
Lymphs Abs: 2.6 10*3/uL (ref 0.7–4.0)
MCH: 20.2 pg — ABNORMAL LOW (ref 26.0–34.0)
MCHC: 28.5 g/dL — AB (ref 30.0–36.0)
MCV: 71 fL — AB (ref 78.0–100.0)
MONO ABS: 1.3 10*3/uL — AB (ref 0.1–1.0)
Monocytes Relative: 11 % (ref 3–12)
NEUTROS PCT: 65 % (ref 43–77)
Neutro Abs: 7.4 10*3/uL (ref 1.7–7.7)
PLATELETS: 405 10*3/uL — AB (ref 150–400)
RBC: 1.93 MIL/uL — ABNORMAL LOW (ref 3.87–5.11)
RDW: 19.8 % — AB (ref 11.5–15.5)
WBC: 11.4 10*3/uL — ABNORMAL HIGH (ref 4.0–10.5)

## 2014-02-21 LAB — RETICULOCYTES
RBC.: 1.97 MIL/uL — ABNORMAL LOW (ref 3.87–5.11)
RETIC COUNT ABSOLUTE: 70.9 10*3/uL (ref 19.0–186.0)
RETIC CT PCT: 3.6 % — AB (ref 0.4–3.1)

## 2014-02-21 LAB — I-STAT CHEM 8, ED
BUN: 37 mg/dL — AB (ref 6–23)
CHLORIDE: 104 meq/L (ref 96–112)
CREATININE: 1.5 mg/dL — AB (ref 0.50–1.10)
Calcium, Ion: 1.11 mmol/L — ABNORMAL LOW (ref 1.13–1.30)
Glucose, Bld: 253 mg/dL — ABNORMAL HIGH (ref 70–99)
HCT: 15 % — ABNORMAL LOW (ref 36.0–46.0)
Hemoglobin: 5.1 g/dL — CL (ref 12.0–15.0)
Potassium: 5.1 mEq/L (ref 3.7–5.3)
SODIUM: 133 meq/L — AB (ref 137–147)
TCO2: 21 mmol/L (ref 0–100)

## 2014-02-21 LAB — PREPARE RBC (CROSSMATCH)

## 2014-02-21 LAB — MRSA PCR SCREENING: MRSA BY PCR: POSITIVE — AB

## 2014-02-21 LAB — COMPREHENSIVE METABOLIC PANEL
ALT: 12 U/L (ref 0–35)
AST: 30 U/L (ref 0–37)
Albumin: 3.2 g/dL — ABNORMAL LOW (ref 3.5–5.2)
Alkaline Phosphatase: 95 U/L (ref 39–117)
Anion gap: 14 (ref 5–15)
BUN: 35 mg/dL — ABNORMAL HIGH (ref 6–23)
CALCIUM: 9 mg/dL (ref 8.4–10.5)
CO2: 21 mEq/L (ref 19–32)
CREATININE: 1.42 mg/dL — AB (ref 0.50–1.10)
Chloride: 96 mEq/L (ref 96–112)
GFR calc non Af Amer: 33 mL/min — ABNORMAL LOW (ref >90)
GFR, EST AFRICAN AMERICAN: 39 mL/min — AB (ref >90)
Glucose, Bld: 250 mg/dL — ABNORMAL HIGH (ref 70–99)
Potassium: 5.3 mEq/L (ref 3.7–5.3)
SODIUM: 131 meq/L — AB (ref 137–147)
Total Bilirubin: 0.4 mg/dL (ref 0.3–1.2)
Total Protein: 6.6 g/dL (ref 6.0–8.3)

## 2014-02-21 LAB — PROTIME-INR
INR: 2.55 — AB (ref 0.00–1.49)
PROTHROMBIN TIME: 27.7 s — AB (ref 11.6–15.2)

## 2014-02-21 LAB — IRON AND TIBC: UIBC: 448 ug/dL — AB (ref 125–400)

## 2014-02-21 LAB — FOLATE: Folate: >20 ng/mL

## 2014-02-21 LAB — APTT: aPTT: 31 seconds (ref 24–37)

## 2014-02-21 LAB — I-STAT TROPONIN, ED: TROPONIN I, POC: 0.03 ng/mL (ref 0.00–0.08)

## 2014-02-21 LAB — POC OCCULT BLOOD, ED: Fecal Occult Bld: POSITIVE — AB

## 2014-02-21 LAB — FERRITIN: FERRITIN: 11 ng/mL (ref 10–291)

## 2014-02-21 LAB — GLUCOSE, CAPILLARY: Glucose-Capillary: 265 mg/dL — ABNORMAL HIGH (ref 70–99)

## 2014-02-21 LAB — VITAMIN B12: Vitamin B-12: 600 pg/mL (ref 211–911)

## 2014-02-21 LAB — I-STAT CG4 LACTIC ACID, ED: Lactic Acid, Venous: 2.05 mmol/L (ref 0.5–2.2)

## 2014-02-21 MED ORDER — FUROSEMIDE 10 MG/ML IJ SOLN
20.0000 mg | Freq: Once | INTRAMUSCULAR | Status: AC
  Administered 2014-02-21: 20 mg via INTRAVENOUS
  Filled 2014-02-21: qty 2

## 2014-02-21 MED ORDER — HALOPERIDOL 0.5 MG PO TABS
0.5000 mg | ORAL_TABLET | Freq: Every day | ORAL | Status: DC
  Filled 2014-02-21 (×2): qty 1

## 2014-02-21 MED ORDER — PANTOPRAZOLE SODIUM 40 MG IV SOLR
40.0000 mg | Freq: Two times a day (BID) | INTRAVENOUS | Status: DC
  Administered 2014-02-21 – 2014-02-27 (×12): 40 mg via INTRAVENOUS
  Filled 2014-02-21 (×13): qty 40

## 2014-02-21 MED ORDER — POLYETHYLENE GLYCOL 3350 17 G PO PACK: 17.0000 g | PACK | Freq: Every day | ORAL | Status: DC

## 2014-02-21 MED ORDER — METOPROLOL TARTRATE 12.5 MG HALF TABLET: 12.5000 mg | ORAL_TABLET | Freq: Two times a day (BID) | ORAL | Status: DC

## 2014-02-21 MED ORDER — NITROGLYCERIN 0.4 MG SL SUBL: 0.4000 mg | SUBLINGUAL_TABLET | SUBLINGUAL | Status: DC | PRN

## 2014-02-21 MED ORDER — TRAMADOL HCL 50 MG PO TABS: 50.0000 mg | ORAL_TABLET | Freq: Four times a day (QID) | ORAL | Status: DC | PRN

## 2014-02-21 MED ORDER — ACETAMINOPHEN 500 MG PO TABS
1000.0000 mg | ORAL_TABLET | Freq: Three times a day (TID) | ORAL | Status: DC | PRN
  Administered 2014-02-27 (×2): 1000 mg via ORAL
  Filled 2014-02-21 (×2): qty 2

## 2014-02-21 MED ORDER — MECLIZINE HCL 12.5 MG PO TABS
12.5000 mg | ORAL_TABLET | ORAL | Status: DC | PRN
  Filled 2014-02-21: qty 1

## 2014-02-21 MED ORDER — LORAZEPAM 2 MG/ML IJ SOLN: 0.5000 mg | Freq: Once | INTRAMUSCULAR | Status: DC

## 2014-02-21 MED ORDER — LORAZEPAM 0.5 MG PO TABS: 0.5000 mg | ORAL_TABLET | Freq: Two times a day (BID) | ORAL | Status: DC | PRN

## 2014-02-21 MED ORDER — COLCHICINE 0.6 MG PO TABS
0.6000 mg | ORAL_TABLET | Freq: Every day | ORAL | Status: DC
  Filled 2014-02-21 (×2): qty 1

## 2014-02-21 MED ORDER — ONDANSETRON HCL 4 MG PO TABS: 4.0000 mg | ORAL_TABLET | Freq: Four times a day (QID) | ORAL | Status: DC | PRN

## 2014-02-21 MED ORDER — ACETAMINOPHEN 325 MG PO TABS: 650.0000 mg | ORAL_TABLET | Freq: Two times a day (BID) | ORAL | Status: DC

## 2014-02-21 MED ORDER — SODIUM CHLORIDE 0.9 % IV SOLN
INTRAVENOUS | Status: DC
  Administered 2014-02-23 – 2014-02-26 (×5): via INTRAVENOUS

## 2014-02-21 MED ORDER — INSULIN ASPART 100 UNIT/ML ~~LOC~~ SOLN
0.0000 [IU] | Freq: Three times a day (TID) | SUBCUTANEOUS | Status: DC
  Administered 2014-02-22: 2 [IU] via SUBCUTANEOUS
  Administered 2014-02-23: 1 [IU] via SUBCUTANEOUS
  Administered 2014-02-23 – 2014-02-24 (×3): 2 [IU] via SUBCUTANEOUS
  Administered 2014-02-24: 3 [IU] via SUBCUTANEOUS
  Administered 2014-02-27 (×2): 1 [IU] via SUBCUTANEOUS

## 2014-02-21 MED ORDER — QUETIAPINE 12.5 MG HALF TABLET: 12.5000 mg | ORAL_TABLET | Freq: Every day | ORAL | Status: DC

## 2014-02-21 MED ORDER — SODIUM CHLORIDE 0.9 % IV SOLN
10.0000 mL/h | Freq: Once | INTRAVENOUS | Status: AC
  Administered 2014-02-21: 10 mL/h via INTRAVENOUS

## 2014-02-21 MED ORDER — VITAMIN K1 10 MG/ML IJ SOLN
10.0000 mg | Freq: Once | INTRAVENOUS | Status: DC
  Filled 2014-02-21: qty 1

## 2014-02-21 MED ORDER — ONDANSETRON HCL 4 MG/2ML IJ SOLN: 4.0000 mg | Freq: Three times a day (TID) | INTRAMUSCULAR | Status: DC | PRN

## 2014-02-21 MED ORDER — MIRTAZAPINE 15 MG PO TABS: 15.0000 mg | ORAL_TABLET | Freq: Every day | ORAL | Status: DC

## 2014-02-21 MED ORDER — ONDANSETRON HCL 4 MG/2ML IJ SOLN: 4.0000 mg | Freq: Four times a day (QID) | INTRAMUSCULAR | Status: DC | PRN

## 2014-02-21 NOTE — ED Notes (Signed)
Admitting MD reports she is going to order a CBC to be drawn after 2nd transfusion and is ordering lasix for in between 1st and 2nd transfusions.

## 2014-02-21 NOTE — ED Notes (Addendum)
Pt denies back pain, no rash noted, clear  Lung sounds. Pt alert and oriented per baseline.

## 2014-02-21 NOTE — ED Provider Notes (Signed)
CSN: [id number]     Arrival date & time 02/21/14  1403 History   First MD Initiated Contact with Patient 02/21/14 1504     Chief Complaint  Patient presents with  . Anemia     (Consider location/radiation/quality/duration/timing/severity/associated sxs/prior Treatment) HPI [name] is a 80 y.o. female who presents to emergency department complaining of weakness and hemoglobin of 4 at primary care doctor's office. Patient is here from [facility] assisted living facility, at baseline uses wheelchair, but is able to do ADLs, and will herself around the facility. Over the last 5 days, patient has had progressive weakness and generalized malaise. Patient denies any pain. [name] is unable to recall last bowel movement so unsure if his diarrhea or if there is any blood in [name] stool. [name] denies any fever or chills. [name] denies any shortness of breath. [name] states "I am just weak." According to patient's family, patient was evaluated by a physician at the facility and had blood work done, was told hemoglobin is 4 and was brought here. Although patient and [name] family deny [name] being on blood thinners, patient's medication list includes Xarelto.   Past Medical History  Diagnosis Date  . HTN (hypertension)   . Coronary atherosclerosis of unspecified type of vessel, native or graft     a. remote CABG in 1999 with Dr. Cyndia Bent. b. s/p PCI with stent to LCX in 2009 (Platinum protocol study). c. NSTEMI 10/2011 s/p BMS to SVG->diagonal.  . Personal history of other diseases of digestive system   . Gout, unspecified     "have had it in my hands and my feet"  . Unspecified hereditary and idiopathic peripheral neuropathy   . Esophageal reflux   . Anemia   . GI bleed   . Leukocytosis 03/16/2011  . Hyperlipidemia     Statin intolerant  . Pancreas disorder     prior pancreatectomy in 7510 complicated by leak requiring prolong and repeated drainage and subsequent pancreatic stent  . Other chronic pulmonary  heart diseases   . Type I (juvenile type) diabetes mellitus without mention of complication, not stated as uncontrolled     Previously on insulin pump  . Permanent atrial fibrillation   . Anginal pain 11/24/11    "1st time"  . Exertional dyspnea 11/24/11  . Depression   . 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 CLL (chronic lymphocytic leukemia)     seeing Dr. Marin Olp  . Myocardial infarction 1999    "on the verge of one"  . NSTEMI (non-ST elevated myocardial infarction) 10/2011    /H&P  . Stroke   . Atrial fibrillation   . Depression   . Acid reflux   . Dementia    Past Surgical History  Procedure Laterality Date  . Oophorectomy  1970's    w hysterectomy and bladder tacking procedure  . Belpharoptosis repair      right upper eyelid  . Esophagogastroduodenoscopy  07/11/2010  . Colonoscopy  10/20/06  . Tonsillectomy  1947  . Appendectomy  1970's?  . Abdominal hysterectomy  1970's  . Coronary angioplasty with stent placement  10/2011    "makes total of 4"; 20-30% ostial left main stenosis; occluded ostial LAD supplied by IMA graft, 20-30% scattered LCx stenoses with widely patent stent and 20% narrowing; diffuse RCA luminal irregularities up to 20-30%; SVG-diagonal patent, but proximal 90% stenosis s/p BMS; patent LIMA-LAD; LVEF 55-65%.  . Coronary angioplasty with stent placement  2009    LCX  .  Cataract extraction w/ intraocular lens  implant, bilateral  ~ 2003  . Coronary artery bypass graft  1999    LIMA to LAD, SVG to the DX  . Pancreatectomy  2011    c/b leak requiring multiple drainage attempts and stent placement), /H&P  . Cardiac surgery    . Colonoscopy  01/20/2012    Procedure: COLONOSCOPY;  Surgeon: Rogene Houston, MD;  Location: [facility];  Service: Endoscopy;  Laterality: N/A;   Family History  Problem Relation Age of Onset  . Stroke Mother   . Lung cancer Father   . Healthy Son    History  Substance Use Topics  . Smoking  status: Never Smoker   . Smokeless tobacco: Never Used  . Alcohol Use: No   OB History    No data available     Review of Systems  Constitutional: Positive for fatigue. Negative for fever and chills.  Respiratory: Negative for cough, chest tightness and shortness of breath.   Cardiovascular: Negative for chest pain, palpitations and leg swelling.  Gastrointestinal: Negative for nausea, vomiting, abdominal pain and diarrhea.  Genitourinary: Negative for dysuria, flank pain, vaginal bleeding, vaginal discharge, vaginal pain and pelvic pain.  Musculoskeletal: Negative for myalgias, arthralgias, neck pain and neck stiffness.  Skin: Positive for pallor. Negative for rash.  Neurological: Positive for weakness. Negative for dizziness and headaches.  All other systems reviewed and are negative.     Allergies  Codeine; Ivp dye; Morphine; Prednisone; and Statins  Home Medications   Prior to Admission medications   Medication Sig Start Date End Date Taking? Authorizing Provider  acetaminophen (TYLENOL EX ST ARTHRITIS PAIN) 500 MG tablet Take 1,000 mg by mouth every 8 (eight) hours as needed (for pain).   Yes Historical Provider, MD  benzonatate (TESSALON) 100 MG capsule Take 100 mg by mouth 3 (three) times daily as needed for cough.   Yes Historical Provider, MD  colchicine 0.6 MG tablet Take 0.6 mg by mouth daily with breakfast.    Yes Historical Provider, MD  furosemide (LASIX) 20 MG tablet Take 10 mg by mouth daily with breakfast.   Yes Historical Provider, MD  guaiFENesin (MUCINEX) 600 MG 12 hr tablet Take 600 mg by mouth every 12 (twelve) hours as needed for cough.   Yes Historical Provider, MD  haloperidol (HALDOL) 0.5 MG tablet Take 0.5 mg by mouth daily with breakfast.   Yes Historical Provider, MD  insulin aspart (NOVOLOG) 100 UNIT/ML injection Inject 10-15 Units into the skin 3 (three) times daily with meals. Patient takes 10 units at breakfast, 10 units at lunch and 15 units at  supper 07/04/12  Yes Lezlie Octave Black, NP  insulin glargine (LANTUS) 100 UNIT/ML injection Inject 18 Units into the skin daily with breakfast.  07/04/12  Yes Lezlie Octave Black, NP  lisinopril (PRINIVIL,ZESTRIL) 5 MG tablet Take 5 mg by mouth daily with breakfast.   Yes Historical Provider, MD  LORazepam (ATIVAN) 0.5 MG tablet Take 1 tablet (0.5 mg total) by mouth 2 (two) times daily as needed for anxiety (agitation). 10/08/12  Yes Samuella Cota, MD  Magnesium 250 MG TABS Take 250 mg by mouth 2 (two) times daily.   Yes Historical Provider, MD  meclizine (ANTIVERT) 12.5 MG tablet Take 12.5 mg by mouth every 4 (four) hours as needed for dizziness. If meclizine is not tolerated give phenergan suppository   Yes Historical Provider, MD  mirtazapine (REMERON) 15 MG tablet Take 15 mg by mouth daily with  breakfast.   Yes Historical Provider, MD  nitroGLYCERIN (NITROSTAT) 0.4 MG SL tablet Place 1 tablet (0.4 mg total) under the tongue every 5 (five) minutes x 3 doses as needed for chest pain. 11/14/11 02/21/14 Yes Dayna N Dunn, PA-C  ondansetron (ZOFRAN) 4 MG tablet Take 4 mg by mouth every 8 (eight) hours as needed for nausea.   Yes Historical Provider, MD  promethazine (PHENERGAN) 12.5 MG suppository Place 12.5 mg rectally every 6 (six) hours as needed for nausea or vomiting.   Yes Historical Provider, MD  Rivaroxaban (XARELTO) 20 MG TABS Take 20 mg by mouth daily with breakfast.    Yes Historical Provider, MD  senna-docusate (SENOKOT-S) 8.6-50 MG per tablet Take 1 tablet by mouth daily as needed for mild constipation.   Yes Historical Provider, MD  sulfamethoxazole-trimethoprim (BACTRIM DS,SEPTRA DS) 800-160 MG per tablet Take 1 tablet by mouth 2 (two) times daily. For 4 days 02/18/14  Yes Historical Provider, MD  vitamin D, CHOLECALCIFEROL, 400 UNITS tablet Take 400 Units by mouth daily with breakfast.   Yes Historical Provider, MD  acetaminophen (TYLENOL) 325 MG tablet Take 650 mg by mouth 2 (two) times daily.     Historical Provider, MD  cephALEXin (KEFLEX) 250 MG capsule Take 250 mg by mouth at bedtime.    Historical Provider, MD  Cholecalciferol (VITAMIN D) 2000 UNITS CAPS Take 2 capsules by mouth daily.    Historical Provider, MD  dextrose (INSTA-GLUCOSE) 40 % GEL Take 1 Tube by mouth once as needed (Use if blood sugar is 60-to recheck after 15 minutes).    Historical Provider, MD  docusate sodium (COLACE) 100 MG capsule Take 100 mg by mouth daily.     Historical Provider, MD  magnesium oxide (MAG-OX) 400 MG tablet Take 400 mg by mouth 3 (three) times daily.     Historical Provider, MD  metoprolol tartrate (LOPRESSOR) 25 MG tablet Take 12.5 mg by mouth 2 (two) times daily.  11/14/11 11/13/12  Dayna N Dunn, PA-C  Multiple Vitamin (DAILY VITE PO) Take 1 tablet by mouth daily.    Historical Provider, MD  olmesartan (BENICAR) 40 MG tablet Take 40 mg by mouth daily. 11/14/11   Dayna N Dunn, PA-C  omeprazole (PRILOSEC) 20 MG capsule Take 20 mg by mouth daily.    Historical Provider, MD  polyethylene glycol (MIRALAX / GLYCOLAX) packet Take 17 g by mouth daily.    Historical Provider, MD  QUEtiapine (SEROQUEL) 25 MG tablet Take 0.5 tablets (12.5 mg total) by mouth at bedtime. 10/08/12   Samuella Cota, MD  traMADol (ULTRAM) 50 MG tablet Take 50 mg by mouth 3 (three) times daily.    Historical Provider, MD  vitamin C (ASCORBIC ACID) 500 MG tablet Take 500 mg by mouth 2 (two) times daily.    Historical Provider, MD   BP 117/48 mmHg  Pulse 86  Temp(Src) 98.8 F (37.1 C) (Oral)  Resp 28  SpO2 97% Physical Exam  Constitutional: [name] appears well-developed and well-nourished. No distress.  HENT:  Head: Normocephalic.  Eyes: Conjunctivae are normal.  Neck: Neck supple.  Cardiovascular: Normal rate, regular rhythm and normal heart sounds.   Pulmonary/Chest: Effort normal and breath sounds normal. No respiratory distress. [name] has no wheezes. [name] has no rales.  Abdominal: Soft. Bowel sounds are normal. [name] exhibits  no distension. There is tenderness. There is no rebound.  LLQ tenderness  Musculoskeletal: [name] exhibits no edema.  Neurological: [name] is alert.  Skin: Skin is warm and dry.  There is pallor.  Psychiatric: [name] has a normal mood and affect. [name] behavior is normal.  Nursing note and vitals reviewed.   ED Course  Procedures (including critical care time) Labs Review Labs Reviewed  CBC WITH DIFFERENTIAL - Abnormal; Notable for the following:    WBC 11.4 (*)    RBC 1.93 (*)    Hemoglobin 3.9 (*)    HCT 13.7 (*)    MCV 71.0 (*)    MCH 20.2 (*)    MCHC 28.5 (*)    RDW 19.8 (*)    Platelets 405 (*)    Monocytes Absolute 1.3 (*)    All other components within normal limits  COMPREHENSIVE METABOLIC PANEL - Abnormal; Notable for the following:    Sodium 131 (*)    Glucose, Bld 250 (*)    BUN 35 (*)    Creatinine, Ser 1.42 (*)    Albumin 3.2 (*)    GFR calc non Af Amer 33 (*)    GFR calc Af Amer 39 (*)    All other components within normal limits  PROTIME-INR - Abnormal; Notable for the following:    Prothrombin Time 27.7 (*)    INR 2.55 (*)    All other components within normal limits  RETICULOCYTES - Abnormal; Notable for the following:    Retic Ct Pct 3.6 (*)    RBC. 1.97 (*)    All other components within normal limits  I-STAT CHEM 8, ED - Abnormal; Notable for the following:    Sodium 133 (*)    BUN 37 (*)    Creatinine, Ser 1.50 (*)    Glucose, Bld 253 (*)    Calcium, Ion 1.11 (*)    Hemoglobin 5.1 (*)    HCT 15.0 (*)    All other components within normal limits  POC OCCULT BLOOD, ED - Abnormal; Notable for the following:    Fecal Occult Bld POSITIVE (*)    All other components within normal limits  APTT  URINALYSIS, ROUTINE W REFLEX MICROSCOPIC  VITAMIN B12  FOLATE  IRON AND TIBC  FERRITIN  I-STAT TROPOININ, ED  I-STAT CG4 LACTIC ACID, ED  TYPE AND SCREEN  PREPARE RBC (CROSSMATCH)    Imaging Review No results found.   EKG Interpretation None      MDM    Final diagnoses:  Anemia, unspecified anemia type  Gastrointestinal hemorrhage, unspecified gastritis, unspecified gastrointestinal hemorrhage type  Weakness    Patient is here from assisted living facility with hemoglobin of 4. Progressive weakness over last 5 days. [name] is on xarelto, history of GI bleed. Will get lab work, type and screen, transfuse, get Hemoccults. This time vital signs are stable. Patient denies any complaints.  5:30 PM Hemoccult positive. Hx of GI bleed per record review, followed by Dr. Laural Golden in [place]. Pt's VS are normal, [name] is not tachycardic, BP 169C systolic. Abdomen benign. Spoke with Dr. Collene Mares, will consult. Spoke with [facility] will admit.   Considered reversal of xarelto, however, at this time, pt is hemodynamically stable, will hold of, Admitting physician informed.   Pt is now DNR   Renold Genta, PA-C 02/22/14 0046  Tanna Furry, MD 03/03/14 [phone]

## 2014-02-21 NOTE — Consult Note (Signed)
UNASSIGNED PATIENT Reason for Consult: Severe anemia with heme positive stools. Referring Physician: ER-PA-C  REBECCAH IVINS is an 80 y.o. female.  HPI: Patient is a 80 year old white female with multiple medical issues listed below, medical history significant for CAD, stroke and atrial fibrrilation on  Xarelto presented with progressive weakness over last 5 days. As per the patient's daughter in law patient was not able to ambulate the day prior to admission and was found to be very pale this morning when labs were ordered. Patient was found to have Hb at 4. She was refer for admission. Patient unable to provide history regarding melena, hematochezia. She was found to have occult blood positive, and Hb at 3.9 gm/dl. Patient denies abdominal pain, chest pain or dyspnea.   Past Medical History  Diagnosis Date  . HTN (hypertension)   . Coronary atherosclerosis of unspecified type of vessel, native or graft     a. remote CABG in 1999 with Dr. Cyndia Bent. b. s/p PCI with stent to LCX in 2009 (Platinum protocol study). c. NSTEMI 10/2011 s/p BMS to SVG->diagonal.  . Personal history of other diseases of digestive system   . Gout, unspecified     "have had it in my hands and my feet"  . Unspecified hereditary and idiopathic peripheral neuropathy   . Esophageal reflux   . Anemia   . GI bleed   . Leukocytosis 03/16/2011  . Hyperlipidemia     Statin intolerant  . Pancreas disorder     prior pancreatectomy in 1308 complicated by leak requiring prolong and repeated drainage and subsequent pancreatic stent  . Other chronic pulmonary heart diseases   . Type I (juvenile type) diabetes mellitus without mention of complication, not stated as uncontrolled     Previously on insulin pump  . Permanent atrial fibrillation   . Anginal pain 11/24/11    "1st time"  . Exertional dyspnea 11/24/11  . Depression   . 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 CLL (chronic lymphocytic leukemia)      seeing Dr. Marin Olp  . Myocardial infarction 1999    "on the verge of one"  . NSTEMI (non-ST elevated myocardial infarction) 10/2011    /H&P  . Stroke   . Atrial fibrillation   . Depression   . Acid reflux   . Dementia    Past Surgical History  Procedure Laterality Date  . Oophorectomy  1970's    w hysterectomy and bladder tacking procedure  . Belpharoptosis repair      right upper eyelid  . Esophagogastroduodenoscopy  07/11/2010  . Colonoscopy  10/20/06  . Tonsillectomy  1947  . Appendectomy  1970's?  . Abdominal hysterectomy  1970's  . Coronary angioplasty with stent placement  10/2011    "makes total of 4"; 20-30% ostial left main stenosis; occluded ostial LAD supplied by IMA graft, 20-30% scattered LCx stenoses with widely patent stent and 20% narrowing; diffuse RCA luminal irregularities up to 20-30%; SVG-diagonal patent, but proximal 90% stenosis s/p BMS; patent LIMA-LAD; LVEF 55-65%.  . Coronary angioplasty with stent placement  2009    LCX  . Cataract extraction w/ intraocular lens  implant, bilateral  ~ 2003  . Coronary artery bypass graft  1999    LIMA to LAD, SVG to the DX  . Pancreatectomy  2011    c/b leak requiring multiple drainage attempts and stent placement), /H&P  . Cardiac surgery    . Colonoscopy  01/20/2012    Procedure: COLONOSCOPY;  Surgeon: Rogene Houston, MD;  Location: [facility];  Service: Endoscopy;  Laterality: N/A;    Family History  Problem Relation Age of Onset  . Stroke Mother   . Lung cancer Father   . Healthy Son    Social History:  reports that she has never smoked. She has never used smokeless tobacco. She reports that she does not drink alcohol or use illicit drugs.  Allergies:  Allergies  Allergen Reactions  . Codeine Nausea And Vomiting  . Ivp Dye [Iodinated Diagnostic Agents] Other (See Comments)    Burst veins in her arm.   . Morphine Other (See Comments)    Not in Right State of Mind.   . Prednisone Other (See Comments)     Patient became delirious/combative when given 58m dose  . Statins Other (See Comments)    Leg Cramps.    Medications: I have reviewed the patient's current medications.  Results for orders placed or performed during the hospital encounter of 02/21/14 (from the past 48 hour(s))  CBC with Differential     Status: Abnormal   Collection Time: 02/21/14  2:34 PM  Result Value Ref Range   WBC 11.4 (H) 4.0 - 10.5 K/uL   RBC 1.93 (L) 3.87 - 5.11 MIL/uL   Hemoglobin 3.9 (LL) 12.0 - 15.0 g/dL    Comment: REPEATED TO VERIFY CRITICAL RESULT CALLED TO, READ BACK BY AND VERIFIED WITH: HAAMILTON J RN 1538 02/21/14 [place] V    HCT 13.7 (L) 36.0 - 46.0 %   MCV 71.0 (L) 78.0 - 100.0 fL   MCH 20.2 (L) 26.0 - 34.0 pg   MCHC 28.5 (L) 30.0 - 36.0 g/dL   RDW 19.8 (H) 11.5 - 15.5 %   Platelets 405 (H) 150 - 400 K/uL   Neutrophils Relative % 65 43 - 77 %   Lymphocytes Relative 23 12 - 46 %   Monocytes Relative 11 3 - 12 %   Eosinophils Relative 0 0 - 5 %   Basophils Relative 1 0 - 1 %   Neutro Abs 7.4 1.7 - 7.7 K/uL   Lymphs Abs 2.6 0.7 - 4.0 K/uL   Monocytes Absolute 1.3 (H) 0.1 - 1.0 K/uL   Eosinophils Absolute 0.0 0.0 - 0.7 K/uL   Basophils Absolute 0.1 0.0 - 0.1 K/uL   RBC Morphology POLYCHROMASIA PRESENT     Comment: MARKED POIKILOCYTOSIS  Comprehensive metabolic panel     Status: Abnormal   Collection Time: 02/21/14  2:34 PM  Result Value Ref Range   Sodium 131 (L) 137 - 147 mEq/L   Potassium 5.3 3.7 - 5.3 mEq/L   Chloride 96 96 - 112 mEq/L   CO2 21 19 - 32 mEq/L   Glucose, Bld 250 (H) 70 - 99 mg/dL   BUN 35 (H) 6 - 23 mg/dL   Creatinine, Ser 1.42 (H) 0.50 - 1.10 mg/dL   Calcium 9.0 8.4 - 10.5 mg/dL   Total Protein 6.6 6.0 - 8.3 g/dL   Albumin 3.2 (L) 3.5 - 5.2 g/dL   AST 30 0 - 37 U/L   ALT 12 0 - 35 U/L   Alkaline Phosphatase 95 39 - 117 U/L   Total Bilirubin 0.4 0.3 - 1.2 mg/dL   GFR calc non Af Amer 33 (L) >90 mL/min   GFR calc Af Amer 39 (L) >90 mL/min    Comment:  (NOTE) The eGFR has been calculated using the CKD EPI equation. This calculation has not been validated in  all clinical situations. eGFR's persistently <90 mL/min signify possible Chronic Kidney Disease.    Anion gap 14 5 - 15  APTT     Status: None   Collection Time: 02/21/14  2:34 PM  Result Value Ref Range   aPTT 31 24 - 37 seconds  Protime-INR     Status: Abnormal   Collection Time: 02/21/14  2:34 PM  Result Value Ref Range   Prothrombin Time 27.7 (H) 11.6 - 15.2 seconds   INR 2.55 (H) 0.00 - 1.49  Reticulocytes     Status: Abnormal   Collection Time: 02/21/14  2:34 PM  Result Value Ref Range   Retic Ct Pct 3.6 (H) 0.4 - 3.1 %   RBC. 1.97 (L) 3.87 - 5.11 MIL/uL   Retic Count, Manual 70.9 19.0 - 186.0 K/uL  I-Stat Chem 8, ED     Status: Abnormal   Collection Time: 02/21/14  2:42 PM  Result Value Ref Range   Sodium 133 (L) 137 - 147 mEq/L   Potassium 5.1 3.7 - 5.3 mEq/L   Chloride 104 96 - 112 mEq/L   BUN 37 (H) 6 - 23 mg/dL   Creatinine, Ser 1.50 (H) 0.50 - 1.10 mg/dL   Glucose, Bld 253 (H) 70 - 99 mg/dL   Calcium, Ion 1.11 (L) 1.13 - 1.30 mmol/L   TCO2 21 0 - 100 mmol/L   Hemoglobin 5.1 (LL) 12.0 - 15.0 g/dL   HCT 15.0 (L) 36.0 - 46.0 %   Comment NOTIFIED PHYSICIAN   Type and screen     Status: None (Preliminary result)   Collection Time: 02/21/14  3:01 PM  Result Value Ref Range   ABO/RH(D) O POS    Antibody Screen NEG    Sample Expiration 02/24/2014    Unit Number [id number]    Blood Component Type RED CELLS,LR    Unit division 00    Status of Unit ISSUED    Transfusion Status OK TO TRANSFUSE    Crossmatch Result Compatible    Unit Number [id number]    Blood Component Type RBC LR PHER1    Unit division 00    Status of Unit ALLOCATED    Transfusion Status OK TO TRANSFUSE    Crossmatch Result Compatible   Prepare RBC     Status: None   Collection Time: 02/21/14  3:08 PM  Result Value Ref Range   Order Confirmation ORDER PROCESSED BY BLOOD BANK   POC  occult blood, ED RN will collect     Status: Abnormal   Collection Time: 02/21/14  3:46 PM  Result Value Ref Range   Fecal Occult Bld POSITIVE (A) NEGATIVE  I-Stat Troponin, ED (not at [facility])     Status: None   Collection Time: 02/21/14  4:04 PM  Result Value Ref Range   Troponin i, poc 0.03 0.00 - 0.08 ng/mL   Comment 3            Comment: Due to the release kinetics of cTnI, a negative result within the first hours of the onset of symptoms does not rule out myocardial infarction with certainty. If myocardial infarction is still suspected, repeat the test at appropriate intervals.   I-Stat CG4 Lactic Acid, ED     Status: None   Collection Time: 02/21/14  4:06 PM  Result Value Ref Range   Lactic Acid, Venous 2.05 0.5 - 2.2 mmol/L   No results found.  Review of Systems  Unable to perform ROS  Blood pressure 147/51, pulse  79, temperature 98.8 F (37.1 C), temperature source Oral, resp. rate 29, SpO2 98 %. Physical Exam  Constitutional: She appears well-developed and well-nourished.  HENT:  Head: Normocephalic and atraumatic.  Eyes: Conjunctivae and EOM are normal. Pupils are equal, round, and reactive to light.  Neck: Normal range of motion. Neck supple.  GI: Soft. Bowel sounds are normal.  Skin: Skin is warm and dry.  Psychiatric: Her mood appears anxious. Her affect is inappropriate. She is agitated. She expresses impulsivity and inappropriate judgment.   Assessment/Plan: 1) Severe anemia/GERD with guaiac positive stool; transfuse as needed. Dr. Benson Norway to re-evaluate tomorrow. 2) Colonic diverticulosis.  3) Dementia.  4) HTN/Hyperlipidemia.  5) CAD/Stroke/Atrial fibrillation-anticoagulants are on hold for now.  Elah Avellino 02/21/2014, 7:44 PM

## 2014-02-21 NOTE — ED Notes (Signed)
i-stat results given to Dr. Darl Householder. Bryson Corona, RN aware

## 2014-02-21 NOTE — ED Notes (Addendum)
After 15 minutes of blood administration patient denies shortness of breath, does not have a fever, she denies pain, no rash or petechiae present. She is alert an oriented per baseline. VSS.  Warm blankets provided to comfort patient.

## 2014-02-21 NOTE — ED Notes (Addendum)
Pt from [facility] c/o hemoglobin of 4, lethargy, and malaise. 20g Left AC. Pt in A-fib HX of permanent A-fib. Rate 88-90

## 2014-02-21 NOTE — ED Notes (Signed)
Bed: EI35 Expected date:  Expected time:  Means of arrival:  Comments: EMS- elderly, Hbg 4

## 2014-02-21 NOTE — H&P (Signed)
[facility] History and Physical  [name] JEH:[id number] DOB: [birth year] DOA: 02/21/2014  Referring physician: PA, [place] PCP: Delphina Cahill, MD   Chief Complaint: weakness. Abnormal lab.   HPI: [name] is a 80 y.o. female with PMH significant for HTN, CAD, Stroke, Afib on Xarelto, Diabetes type 1, who presents with progressive weakness over last 5 days. Per daughter in law patient was able to ambulate the day prior to admission, patient was notice to be very pale this morning and lab work was done. Patient was found to have Hb at 4. She was refer for admission. Patient unable to provide history regarding melena, hematochezia.  She was found to have occult blood positive, and Hb at 3.9. Cr at 1.5 BUN at 37.  Patient also vomited once last thursday, no hematemesis. Patient denies abdominal pain, chest pain or dyspnea.    Review of Systems:  Negative, except as per HPI.   Past Medical History  Diagnosis Date  . HTN (hypertension)   . Coronary atherosclerosis of unspecified type of vessel, native or graft     a. remote CABG in 1999 with Dr. Cyndia Bent. b. s/p PCI with stent to LCX in 2009 (Platinum protocol study). c. NSTEMI 10/2011 s/p BMS to SVG->diagonal.  . Personal history of other diseases of digestive system   . Gout, unspecified     "have had it in my hands and my feet"  . Unspecified hereditary and idiopathic peripheral neuropathy   . Esophageal reflux   . Anemia   . GI bleed   . Leukocytosis 03/16/2011  . Hyperlipidemia     Statin intolerant  . Pancreas disorder     prior pancreatectomy in 3785 complicated by leak requiring prolong and repeated drainage and subsequent pancreatic stent  . Other chronic pulmonary heart diseases   . Type I (juvenile type) diabetes mellitus without mention of complication, not stated as uncontrolled     Previously on insulin pump  . Permanent atrial fibrillation   . Anginal pain 11/24/11    "1st time"  . Exertional dyspnea  11/24/11  . Depression   . 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 CLL (chronic lymphocytic leukemia)     seeing Dr. Marin Olp  . Myocardial infarction 1999    "on the verge of one"  . NSTEMI (non-ST elevated myocardial infarction) 10/2011    /H&P  . Stroke   . Atrial fibrillation   . Depression   . Acid reflux   . Dementia    Past Surgical History  Procedure Laterality Date  . Oophorectomy  1970's    w hysterectomy and bladder tacking procedure  . Belpharoptosis repair      right upper eyelid  . Esophagogastroduodenoscopy  07/11/2010  . Colonoscopy  10/20/06  . Tonsillectomy  1947  . Appendectomy  1970's?  . Abdominal hysterectomy  1970's  . Coronary angioplasty with stent placement  10/2011    "makes total of 4"; 20-30% ostial left main stenosis; occluded ostial LAD supplied by IMA graft, 20-30% scattered LCx stenoses with widely patent stent and 20% narrowing; diffuse RCA luminal irregularities up to 20-30%; SVG-diagonal patent, but proximal 90% stenosis s/p BMS; patent LIMA-LAD; LVEF 55-65%.  . Coronary angioplasty with stent placement  2009    LCX  . Cataract extraction w/ intraocular lens  implant, bilateral  ~ 2003  . Coronary artery bypass graft  1999    LIMA to LAD, SVG to the DX  . Pancreatectomy  2011  c/b leak requiring multiple drainage attempts and stent placement), /H&P  . Cardiac surgery    . Colonoscopy  01/20/2012    Procedure: COLONOSCOPY;  Surgeon: Rogene Houston, MD;  Location: [facility];  Service: Endoscopy;  Laterality: N/A;   Social History:  reports that she has never smoked. She has never used smokeless tobacco. She reports that she does not drink alcohol or use illicit drugs.  Allergies  Allergen Reactions  . Codeine Nausea And Vomiting  . Ivp Dye [Iodinated Diagnostic Agents] Other (See Comments)    Burst veins in her arm.   . Morphine Other (See Comments)    Not in Right State of Mind.   . Prednisone Other (See  Comments)    Patient became delirious/combative when given 60mg  dose  . Statins Other (See Comments)    Leg Cramps.     Family History  Problem Relation Age of Onset  . Stroke Mother   . Lung cancer Father   . Healthy Son      Prior to Admission medications   Medication Sig Start Date End Date Taking? Authorizing Provider  acetaminophen (TYLENOL EX ST ARTHRITIS PAIN) 500 MG tablet Take 1,000 mg by mouth every 8 (eight) hours as needed (for pain).   Yes Historical Provider, MD  benzonatate (TESSALON) 100 MG capsule Take 100 mg by mouth 3 (three) times daily as needed for cough.   Yes Historical Provider, MD  colchicine 0.6 MG tablet Take 0.6 mg by mouth daily with breakfast.    Yes Historical Provider, MD  furosemide (LASIX) 20 MG tablet Take 10 mg by mouth daily with breakfast.   Yes Historical Provider, MD  guaiFENesin (MUCINEX) 600 MG 12 hr tablet Take 600 mg by mouth every 12 (twelve) hours as needed for cough.   Yes Historical Provider, MD  haloperidol (HALDOL) 0.5 MG tablet Take 0.5 mg by mouth daily with breakfast.   Yes Historical Provider, MD  insulin aspart (NOVOLOG) 100 UNIT/ML injection Inject 10-15 Units into the skin 3 (three) times daily with meals. Patient takes 10 units at breakfast, 10 units at lunch and 15 units at supper 07/04/12  Yes Lezlie Octave Black, NP  insulin glargine (LANTUS) 100 UNIT/ML injection Inject 18 Units into the skin daily with breakfast.  07/04/12  Yes Lezlie Octave Black, NP  lisinopril (PRINIVIL,ZESTRIL) 5 MG tablet Take 5 mg by mouth daily with breakfast.   Yes Historical Provider, MD  LORazepam (ATIVAN) 0.5 MG tablet Take 1 tablet (0.5 mg total) by mouth 2 (two) times daily as needed for anxiety (agitation). 10/08/12  Yes Samuella Cota, MD  Magnesium 250 MG TABS Take 250 mg by mouth 2 (two) times daily.   Yes Historical Provider, MD  meclizine (ANTIVERT) 12.5 MG tablet Take 12.5 mg by mouth every 4 (four) hours as needed for dizziness. If meclizine is not  tolerated give phenergan suppository   Yes Historical Provider, MD  mirtazapine (REMERON) 15 MG tablet Take 15 mg by mouth daily with breakfast.   Yes Historical Provider, MD  nitroGLYCERIN (NITROSTAT) 0.4 MG SL tablet Place 1 tablet (0.4 mg total) under the tongue every 5 (five) minutes x 3 doses as needed for chest pain. 11/14/11 02/21/14 Yes Dayna N Dunn, PA-C  ondansetron (ZOFRAN) 4 MG tablet Take 4 mg by mouth every 8 (eight) hours as needed for nausea.   Yes Historical Provider, MD  promethazine (PHENERGAN) 12.5 MG suppository Place 12.5 mg rectally every 6 (six) hours as needed for  nausea or vomiting.   Yes Historical Provider, MD  Rivaroxaban (XARELTO) 20 MG TABS Take 20 mg by mouth daily with breakfast.    Yes Historical Provider, MD  senna-docusate (SENOKOT-S) 8.6-50 MG per tablet Take 1 tablet by mouth daily as needed for mild constipation.   Yes Historical Provider, MD  sulfamethoxazole-trimethoprim (BACTRIM DS,SEPTRA DS) 800-160 MG per tablet Take 1 tablet by mouth 2 (two) times daily. For 4 days 02/18/14  Yes Historical Provider, MD  vitamin D, CHOLECALCIFEROL, 400 UNITS tablet Take 400 Units by mouth daily with breakfast.   Yes Historical Provider, MD  acetaminophen (TYLENOL) 325 MG tablet Take 650 mg by mouth 2 (two) times daily.    Historical Provider, MD  cephALEXin (KEFLEX) 250 MG capsule Take 250 mg by mouth at bedtime.    Historical Provider, MD  Cholecalciferol (VITAMIN D) 2000 UNITS CAPS Take 2 capsules by mouth daily.    Historical Provider, MD  dextrose (INSTA-GLUCOSE) 40 % GEL Take 1 Tube by mouth once as needed (Use if blood sugar is 60-to recheck after 15 minutes).    Historical Provider, MD  docusate sodium (COLACE) 100 MG capsule Take 100 mg by mouth daily.     Historical Provider, MD  magnesium oxide (MAG-OX) 400 MG tablet Take 400 mg by mouth 3 (three) times daily.     Historical Provider, MD  metoprolol tartrate (LOPRESSOR) 25 MG tablet Take 12.5 mg by mouth 2 (two) times  daily.  11/14/11 11/13/12  Dayna N Dunn, PA-C  Multiple Vitamin (DAILY VITE PO) Take 1 tablet by mouth daily.    Historical Provider, MD  olmesartan (BENICAR) 40 MG tablet Take 40 mg by mouth daily. 11/14/11   Dayna N Dunn, PA-C  omeprazole (PRILOSEC) 20 MG capsule Take 20 mg by mouth daily.    Historical Provider, MD  polyethylene glycol (MIRALAX / GLYCOLAX) packet Take 17 g by mouth daily.    Historical Provider, MD  QUEtiapine (SEROQUEL) 25 MG tablet Take 0.5 tablets (12.5 mg total) by mouth at bedtime. 10/08/12   Samuella Cota, MD  traMADol (ULTRAM) 50 MG tablet Take 50 mg by mouth 3 (three) times daily.    Historical Provider, MD  vitamin C (ASCORBIC ACID) 500 MG tablet Take 500 mg by mouth 2 (two) times daily.    Historical Provider, MD   Physical Exam: Filed Vitals:   02/21/14 1727 02/21/14 1730 02/21/14 1740 02/21/14 1802  BP:  132/47 128/57 123/63  Pulse:  87 87 90  Temp: 98.2 F (36.8 C)   98.7 F (37.1 C)  TempSrc: Oral   Oral  Resp:  18 20 20   SpO2:  96% 98% 98%    Wt Readings from Last 3 Encounters:  11/07/12 58.968 kg (130 lb)  11/02/12 57 kg (125 lb 10.6 oz)  10/08/12 58.106 kg (128 lb 1.6 oz)    General:  Appears calm and comfortable Eyes: PERRL, normal lids, irises & conjunctiva ENT: grossly normal hearing, lips & tongue Neck: no LAD, masses or thyromegaly Cardiovascular: RRR, no m/r/g. No LE edema. Telemetry: SR, no arrhythmias  Respiratory: CTA bilaterally, no w/r/r. Normal respiratory effort. Abdomen: soft, ntnd Skin: no rash or induration seen on limited exam Musculoskeletal: grossly normal tone BUE/BLE Neurologic: grossly non-focal. Flat affect. Slow respond time.           Labs on Admission:  Basic Metabolic Panel:  Recent Labs Lab 02/21/14 1434 02/21/14 1442  NA 131* 133*  K 5.3 5.1  CL 96 104  CO2 21  --   GLUCOSE 250* 253*  BUN 35* 37*  CREATININE 1.42* 1.50*  CALCIUM 9.0  --    Liver Function Tests:  Recent Labs Lab 02/21/14 1434    AST 30  ALT 12  ALKPHOS 95  BILITOT 0.4  PROT 6.6  ALBUMIN 3.2*   No results for input(s): LIPASE, AMYLASE in the last 168 hours. No results for input(s): AMMONIA in the last 168 hours. CBC:  Recent Labs Lab 02/21/14 1434 02/21/14 1442  WBC 11.4*  --   NEUTROABS 7.4  --   HGB 3.9* 5.1*  HCT 13.7* 15.0*  MCV 71.0*  --   PLT 405*  --    Cardiac Enzymes: No results for input(s): CKTOTAL, CKMB, CKMBINDEX, TROPONINI in the last 168 hours.  BNP (last 3 results) No results for input(s): PROBNP in the last 8760 hours. CBG: No results for input(s): GLUCAP in the last 168 hours.  Radiological Exams on Admission: No results found.  EKG: Independently reviewed. A fib.   Assessment/Plan Active Problems:   Heart disease   Atrial fibrillation   Cerebellar stroke, acute   DM (diabetes mellitus), type 2, uncontrolled   GI bleed   Severe anemia  1-Severe Anemia; Probably chronic GI bleed. Patient on xarelto. No hematochezia, melena at this time.  Admit to step down unit, 2 units of PRBC ordered. Hold xarelto.  Repeat CBC post transfusion, will probably need further Blood transfusion.  IV protonix.  GI consulted by ED already, Dr Collene Mares.   2- GI bleed: Patient with severe anemia, occult blood positive.  Blood transfusion.  IV protonix.  Clear diet, NPO after midnight.  GI consulted.   3-Diabetes: hold Lantus due to NPO status. Will order SSI.   4-A fib ; rate controlled. Continue with metoprolol. Hold xarelto due to concern for GI bleed and anemia.   5-History of hypomagnesemia. Check mg level in am.   6-Acute renal failure; in setting of decrease perfusion due to anemia. Blood transfusion. Hold ACE. Repeat labs in am. Strict I and O.   7-Dementia, with behavior problem; continue with haldol. Remeron.     Code Status: DNR DVT Prophylaxis: scd, no anticoagulation in setting of severe anemia and concern for bleeding.  Family Communication: Care discussed with daughter  in law who was at bedside.  Disposition Plan: expect 3 to 4 days inpatient.   Time spent: 75 minutes.   Niel Hummer A [facility] Pager [phone]

## 2014-02-22 DIAGNOSIS — E1165 Type 2 diabetes mellitus with hyperglycemia: Secondary | ICD-10-CM

## 2014-02-22 DIAGNOSIS — N179 Acute kidney failure, unspecified: Secondary | ICD-10-CM

## 2014-02-22 DIAGNOSIS — K922 Gastrointestinal hemorrhage, unspecified: Principal | ICD-10-CM

## 2014-02-22 DIAGNOSIS — I482 Chronic atrial fibrillation: Secondary | ICD-10-CM

## 2014-02-22 LAB — TYPE AND SCREEN
ABO/RH(D): O POS
Antibody Screen: NEGATIVE
Unit division: 0
Unit division: 0

## 2014-02-22 LAB — CBC
HCT: 25.2 % — ABNORMAL LOW (ref 36.0–46.0)
HEMATOCRIT: 24.8 % — AB (ref 36.0–46.0)
Hemoglobin: 7.9 g/dL — ABNORMAL LOW (ref 12.0–15.0)
Hemoglobin: 8.1 g/dL — ABNORMAL LOW (ref 12.0–15.0)
MCH: 24.6 pg — ABNORMAL LOW (ref 26.0–34.0)
MCH: 24.8 pg — ABNORMAL LOW (ref 26.0–34.0)
MCHC: 31.9 g/dL (ref 30.0–36.0)
MCHC: 32.1 g/dL (ref 30.0–36.0)
MCV: 77.3 fL — ABNORMAL LOW (ref 78.0–100.0)
MCV: 77.1 fL — ABNORMAL LOW (ref 78.0–100.0)
PLATELETS: 382 10*3/uL (ref 150–400)
PLATELETS: 355 10*3/uL (ref 150–400)
RBC: 3.21 MIL/uL — ABNORMAL LOW (ref 3.87–5.11)
RBC: 3.27 MIL/uL — ABNORMAL LOW (ref 3.87–5.11)
RDW: 18.1 % — AB (ref 11.5–15.5)
RDW: 18.2 % — AB (ref 11.5–15.5)
WBC: 12.3 10*3/uL — ABNORMAL HIGH (ref 4.0–10.5)
WBC: 10.7 10*3/uL — AB (ref 4.0–10.5)

## 2014-02-22 LAB — BASIC METABOLIC PANEL
Anion gap: 11 (ref 5–15)
BUN: 31 mg/dL — ABNORMAL HIGH (ref 6–23)
CALCIUM: 9 mg/dL (ref 8.4–10.5)
CO2: 24 mEq/L (ref 19–32)
Chloride: 101 mEq/L (ref 96–112)
Creatinine, Ser: 1.34 mg/dL — ABNORMAL HIGH (ref 0.50–1.10)
GFR, EST AFRICAN AMERICAN: 42 mL/min — AB (ref >90)
GFR, EST NON AFRICAN AMERICAN: 36 mL/min — AB (ref >90)
GLUCOSE: 200 mg/dL — AB (ref 70–99)
POTASSIUM: 4.3 meq/L (ref 3.7–5.3)
SODIUM: 136 meq/L — AB (ref 137–147)

## 2014-02-22 LAB — HEMOGLOBIN A1C
Hgb A1c MFr Bld: 6.6 % — ABNORMAL HIGH (ref <5.7)
Mean Plasma Glucose: 143 mg/dL — ABNORMAL HIGH (ref <117)

## 2014-02-22 LAB — GLUCOSE, CAPILLARY
GLUCOSE-CAPILLARY: 180 mg/dL — AB (ref 70–99)
GLUCOSE-CAPILLARY: 173 mg/dL — AB (ref 70–99)
Glucose-Capillary: 184 mg/dL — ABNORMAL HIGH (ref 70–99)
Glucose-Capillary: 120 mg/dL — ABNORMAL HIGH (ref 70–99)

## 2014-02-22 LAB — MAGNESIUM: MAGNESIUM: 2 mg/dL (ref 1.5–2.5)

## 2014-02-22 MED ORDER — CHLORHEXIDINE GLUCONATE CLOTH 2 % EX PADS
6.0000 | MEDICATED_PAD | Freq: Every day | CUTANEOUS | Status: DC
  Administered 2014-02-24 – 2014-02-27 (×4): 6 via TOPICAL

## 2014-02-22 MED ORDER — INSULIN GLARGINE 100 UNIT/ML ~~LOC~~ SOLN
18.0000 [IU] | Freq: Every day | SUBCUTANEOUS | Status: DC
  Administered 2014-02-23 – 2014-02-27 (×5): 18 [IU] via SUBCUTANEOUS
  Filled 2014-02-22 (×6): qty 0.18

## 2014-02-22 MED ORDER — MUPIROCIN 2 % EX OINT
1.0000 "application " | TOPICAL_OINTMENT | Freq: Two times a day (BID) | CUTANEOUS | Status: DC
  Administered 2014-02-23 – 2014-02-27 (×9): 1 via NASAL
  Filled 2014-02-22: qty 22

## 2014-02-22 NOTE — Plan of Care (Signed)
Problem: Phase I Progression Outcomes Goal: Voiding-avoid urinary catheter unless indicated Outcome: Progressing Pt incontinent of urine.

## 2014-02-22 NOTE — Progress Notes (Signed)
CARE MANAGEMENT NOTE 02/22/2014  Patient:  [name], [name]   Account Number:  [id number]  Date Initiated:  02/22/2014  Documentation initiated by:  Karl Bales  Subjective/Objective Assessment:   pt admitted with weakness and abnormal labs, Hgb 4     Action/Plan:   from [facility]   Anticipated DC Date:  02/25/2014   Anticipated DC Plan:  [place] referral  Clinical Social Worker      DC Planning Services  CM consult      Choice offered to / List presented to:             Status of service:  In process, will continue to follow Medicare Important Message given?   (If response is "NO", the following Medicare IM given date fields will be blank) Date Medicare IM given:   Medicare IM given by:   Date Additional Medicare IM given:   Additional Medicare IM given by:    Discharge Disposition:    Per UR Regulation:  Reviewed for med. necessity/level of care/duration of stay  If discussed at [place] of Stay Meetings, dates discussed:    Comments:  02/22/14 MMcGibboney, RN, BSN Chart reviewew. CSW following for discharge back to SNF.

## 2014-02-22 NOTE — Plan of Care (Signed)
Problem: Phase I Progression Outcomes Goal: Pain controlled with appropriate interventions Outcome: Progressing     

## 2014-02-22 NOTE — Plan of Care (Signed)
Problem: Phase I Progression Outcomes Goal: Hemodynamically stable Outcome: Progressing     

## 2014-02-22 NOTE — Progress Notes (Signed)
INITIAL NUTRITION ASSESSMENT  DOCUMENTATION CODES Per approved criteria  -Not Applicable   INTERVENTION: -Diet advancement per MD -RD to continue to monitor  NUTRITION DIAGNOSIS: Inadequate oral intake related to inability to eat as evidenced by NPO status.   Goal: Pt to meet >/= 90% of their estimated nutrition needs    Monitor:  Diet order, total protein/energy intake, labs,weights, GI profile  Reason for Assessment: MST  80 y.o. female  Admitting Dx: <principal problem not specified>  ASSESSMENT: 10 -year-old female with past medical history of atrial fibrillation, CAD, stroke on anticoagulation with xarelto, diabetes who presented from skilled nursing facility to [facility] ED [facility]/21/2014 with progressive weakness over past couple of days prior to this admission.   -Per NT, pt confused and unable to provide sufficient information r/t food and nutrition hx during time of RD assessment -Pt is NPO for possible GI bleed, GI noted pt refusing endoscopic work at this time -Weight hx indicates pt may have experienced weight loss two years ago (Weighed 162 in 2013, 15% body weight loss, non-significant for time frame) However pt weight has remained stable within 125-135 lb range for > one year -Resident at [employer], is provided [employer] per notes -Will monitor diet advancement and supplement as warranted  Height: Ht Readings from Last 1 Encounters:  02/22/14 5\' 4"  (1.626 m)    Weight: Wt Readings from Last 1 Encounters:  02/22/14 137 lb 2 oz (62.2 kg)    Ideal Body Weight: 120 lb  % Ideal Body Weight: 114%  Wt Readings from Last 10 Encounters:  02/22/14 137 lb 2 oz (62.2 kg)  11/07/12 130 lb (58.968 kg)  11/02/12 125 lb 10.6 oz (57 kg)  10/08/12 128 lb 1.6 oz (58.106 kg)  08/04/12 135 lb (61.236 kg)  07/29/12 120 lb (54.432 kg)  07/03/12 127 lb (57.607 kg)  03/18/12 145 lb (65.772 kg)  01/28/12 161 lb 9.6 oz (73.3 kg)  01/19/12 162 lb 3.2 oz (73.573 kg)     Usual Body Weight: 125-135 lb per records  % Usual Body Weight: 100%  BMI:  Body mass index is 23.53 kg/(m^2).  Estimated Nutritional Needs: Kcal: 1350-1550 Protein: 65-75 gram Fluid: >/=1500 ml daily  Skin: WDL  Diet Order: Diet NPO time specified  EDUCATION NEEDS: -No education needs identified at this time   Intake/Output Summary (Last 24 hours) at 02/22/14 1628 Last data filed at 02/22/14 1522  Gross per 24 hour  Intake   1658 ml  Output      0 ml  Net   1658 ml    Last BM: PTA   Labs:   Recent Labs Lab 02/21/14 1434 02/21/14 1442 02/22/14 0715  NA 131* 133* 136*  K 5.3 5.1 4.3  CL 96 104 101  CO2 21  --  24  BUN 35* 37* 31*  CREATININE 1.42* 1.50* 1.34*  CALCIUM 9.0  --  9.0  MG  --   --  2.0  GLUCOSE 250* 253* 200*    CBG (last 3)   Recent Labs  02/21/14 2135 02/22/14 0807 02/22/14 1151  GLUCAP 265* 184* 120*    Scheduled Meds: . insulin aspart  0-9 Units Subcutaneous TID WC  . [START ON 02/23/2014] insulin glargine  18 Units Subcutaneous Q breakfast  . LORazepam  0.5 mg Intravenous Once  . pantoprazole (PROTONIX) IV  40 mg Intravenous Q12H    Continuous Infusions: . sodium chloride 50 mL/hr at 02/21/14 1900    Past  Medical History  Diagnosis Date  . HTN (hypertension)   . Coronary atherosclerosis of unspecified type of vessel, native or graft     a. remote CABG in 1999 with Dr. Cyndia Bent. b. s/p PCI with stent to LCX in 2009 (Platinum protocol study). c. NSTEMI 10/2011 s/p BMS to SVG->diagonal.  . Personal history of other diseases of digestive system   . Gout, unspecified     "have had it in my hands and my feet"  . Unspecified hereditary and idiopathic peripheral neuropathy   . Esophageal reflux   . Anemia   . GI bleed   . Leukocytosis 03/16/2011  . Hyperlipidemia     Statin intolerant  . Pancreas disorder     prior pancreatectomy in 2355 complicated by leak requiring prolong and repeated drainage and subsequent pancreatic  stent  . Other chronic pulmonary heart diseases   . Type I (juvenile type) diabetes mellitus without mention of complication, not stated as uncontrolled     Previously on insulin pump  . Permanent atrial fibrillation   . Anginal pain 11/24/11    "1st time"  . Exertional dyspnea 11/24/11  . Depression   . 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 CLL (chronic lymphocytic leukemia)     seeing Dr. Marin Olp  . Myocardial infarction 1999    "on the verge of one"  . NSTEMI (non-ST elevated myocardial infarction) 10/2011    /H&P  . Stroke   . Atrial fibrillation   . Depression   . Acid reflux   . Dementia     Past Surgical History  Procedure Laterality Date  . Oophorectomy  1970's    w hysterectomy and bladder tacking procedure  . Belpharoptosis repair      right upper eyelid  . Esophagogastroduodenoscopy  07/11/2010  . Colonoscopy  10/20/06  . Tonsillectomy  1947  . Appendectomy  1970's?  . Abdominal hysterectomy  1970's  . Coronary angioplasty with stent placement  10/2011    "makes total of 4"; 20-30% ostial left main stenosis; occluded ostial LAD supplied by IMA graft, 20-30% scattered LCx stenoses with widely patent stent and 20% narrowing; diffuse RCA luminal irregularities up to 20-30%; SVG-diagonal patent, but proximal 90% stenosis s/p BMS; patent LIMA-LAD; LVEF 55-65%.  . Coronary angioplasty with stent placement  2009    LCX  . Cataract extraction w/ intraocular lens  implant, bilateral  ~ 2003  . Coronary artery bypass graft  1999    LIMA to LAD, SVG to the DX  . Pancreatectomy  2011    c/b leak requiring multiple drainage attempts and stent placement), /H&P  . Cardiac surgery    . Colonoscopy  01/20/2012    Procedure: COLONOSCOPY;  Surgeon: Rogene Houston, MD;  Location: [facility];  Service: Endoscopy;  Laterality: N/A;    Atlee Abide MS RD LDN Clinical Dietitian [phone]

## 2014-02-22 NOTE — Progress Notes (Signed)
Subjective: No acute events.  She feels better.  Objective: Vital signs in last 24 hours: Temp:  [97.5 F (36.4 C)-98.9 F (37.2 C)] 97.9 F (36.6 C) (11/12 1029) Pulse Rate:  [40-107] 68 (11/12 1506) Resp:  [15-35] 19 (11/12 1029) BP: (111-195)/(37-90) 114/56 mmHg (11/12 1506) SpO2:  [91 %-100 %] 91 % (11/12 1506) Weight:  [62.2 kg (137 lb 2 oz)] 62.2 kg (137 lb 2 oz) (11/12 0800) Last BM Date:  (PTA)  Intake/Output from previous day: 11/11 0701 - 11/12 0700 In: 1258 [I.V.:600; Blood:658] Out: -  Intake/Output this shift: Total I/O In: 400 [I.V.:400] Out: -   General appearance: alert and no distress GI: soft, non-tender; bowel sounds normal; no masses,  no organomegaly  Lab Results:  Recent Labs  02/21/14 1434 02/21/14 1442 02/22/14 0220 02/22/14 0715  WBC 11.4*  --  12.3* 10.7*  HGB 3.9* 5.1* 7.9* 8.1*  HCT 13.7* 15.0* 24.8* 25.2*  PLT 405*  --  382 355   BMET  Recent Labs  02/21/14 1434 02/21/14 1442 02/22/14 0715  NA 131* 133* 136*  K 5.3 5.1 4.3  CL 96 104 101  CO2 21  --  24  GLUCOSE 250* 253* 200*  BUN 35* 37* 31*  CREATININE 1.42* 1.50* 1.34*  CALCIUM 9.0  --  9.0   LFT  Recent Labs  02/21/14 1434  PROT 6.6  ALBUMIN 3.2*  AST 30  ALT 12  ALKPHOS 95  BILITOT 0.4   PT/INR  Recent Labs  02/21/14 1434  LABPROT 27.7*  INR 2.55*   Hepatitis Panel No results for input(s): HEPBSAG, HCVAB, HEPAIGM, HEPBIGM in the last 72 hours. C-Diff No results for input(s): CDIFFTOX in the last 72 hours. Fecal Lactopherrin No results for input(s): FECLLACTOFRN in the last 72 hours.  Studies/Results: No results found.  Medications:  Scheduled: . insulin aspart  0-9 Units Subcutaneous TID WC  . [START ON 02/23/2014] insulin glargine  18 Units Subcutaneous Q breakfast  . LORazepam  0.5 mg Intravenous Once  . pantoprazole (PROTONIX) IV  40 mg Intravenous Q12H   Continuous: . sodium chloride 50 mL/hr at 02/21/14 1900     Assessment/Plan: 1) Severe anemia. 2) Heme positive stool.   The patient does not want to have any endoscopic work up at this time.  She states that she will think about it, but it does not appear that she will consent to an EGD/Colonoscopy.  I tried to convince her that it will be in her best benefit, but she declines at this time.  Plan: 1) Continue supportive care. 2) No further GI intervention at this time. 3) Let me know if she changes her mind.   LOS: 1 day   Markies Mowatt D 02/22/2014, 4:31 PM

## 2014-02-22 NOTE — Plan of Care (Signed)
Problem: Phase I Progression Outcomes Goal: Other Phase I Outcomes/Goals Outcome: Completed/Met Date Met:  02/22/14  Problem: Phase II Progression Outcomes Goal: No active bleeding Outcome: Completed/Met Date Met:  02/22/14 Goal: Hemodynamically stable Outcome: Progressing

## 2014-02-22 NOTE — Progress Notes (Addendum)
Patient ID: [name], female   DOB: 30-Dec-1929, 80 y.o.   MRN: [id number] [facility] PROGRESS NOTE  JULYA ALIOTO EXN:[id number] DOB: June 30, 1929 DOA: 02/21/2014 PCP: Delphina Cahill, MD  Brief narrative: 82 -year-old female with past medical history of atrial fibrillation, CAD, stroke on anticoagulation with xarelto, diabetes who presented from skilled nursing facility to [facility] ED 02/21/2014 with progressive weakness over past couple of days prior to this admission. Patient was noted to have hemoglobin of 4 and was referred for admission. In ED, blood pressure was 112/65, heart rate 45 - 107, T max 98.9 F and oxygen saturation 96% on room air.blood work revealed hemoglobin of 5.1, White blood cell count 11.4, platelets 405, sodium 131 and creatinine 1.42. Patient was transfused 2 units of PRBC since admission. GI to see the patient in consultation.   Assessment/Plan:    Principal problem: Acute blood loss anemia / possible GI bleed - Patient is on anticoagulation with xarelto which was put on hold on the admission. - Patient was transfused 2 units of PRBC since admission with appropriate rise in hemoglobin - No further reports of bleeding. - hemoglobin is stable at 7.9 and 8.1 subsequently. - Continue supportive care with IV fluids, Protonix 40 mg IV twice daily. - appreciate GI consult for recommendations. Active Problems: Atrial fibrillation - On anticoagulation with xarelto as mentioned, on hold due to bleed - rate is currently controlled without beta blockers. Heart rate is 60. - Continue to monitor on telemetry. History of CAD - no complaints of chest pain. As mentioned, anticoagulation on hold. DM (diabetes mellitus), type 2, uncontrolled - no recent A1c. A1c in June 2014 was 7.5. Obtain A1c on this admission - Appreciate diabetic coordinator recommendations. Added basal Lantus per home regimen. Continue sliding scale.. Acute renal failure - with superimposed anemia patient  may also have chronic renal failure however we don't have previous recent values for comparison. The last renal function is from 2014 which seemed to be within normal limits. - please note patient is on nephrotoxic agents including Lasix and lisinopril which could contribute to worsening renal function. Because of borderline low blood pressure it is reasonable to continue to hold these medications. - Continue to provide a gentle hydration - Follow-up BMP in the morning.  DVT Prophylaxis  - SCD's bilaterally due to bleeding   Code Status: DNR/DNI  Family Communication:  plan of care discussed with the patient Disposition Plan: transfer to telemetry floor   IV access:   PeripheralIV  Procedures and diagnostic studies:    No results found.  Medical Consultants:   Gastroenterology Other Consultants:   None  IAnti-Infectives:    None    [name], MD  [facility] Pager [phone]  If 7PM-7AM, please contact night-coverage [email or web address] Password [email or web address] 02/22/2014, 10:01 AM   LOS: 1 day    HPI/Subjective: No acute overnight events.  Objective: Filed Vitals:   02/22/14 0400 02/22/14 0500 02/22/14 0800 02/22/14 0900  BP: 147/49 124/54 146/55   Pulse: 69 81 40 61  Temp:   97.9 F (36.6 C)   TempSrc:   Oral   Resp: 24 21 15 19   Height:   5\' 4"  (1.626 m)   Weight:   62.2 kg (137 lb 2 oz)   SpO2: 98% 98% 95% 97%    Intake/Output Summary (Last 24 hours) at 02/22/14 1001 Last data filed at 02/22/14 0900  Gross per 24 hour  Intake   1358 ml  Output  0 ml  Net   1358 ml    Exam:   General:  Pt is not in acute distress  Cardiovascular: Regular rate and rhythm, S1/S2 appreciated   Respiratory: Clear to auscultation bilaterally, no wheezing  Abdomen: Soft, non tender, non distended, bowel sounds present  Extremities: No edema, pulses DP and PT palpable bilaterally  Neuro: Grossly nonfocal  Data Reviewed: Basic Metabolic Panel:  Recent  Labs Lab 02/21/14 1434 02/21/14 1442 02/22/14 0715  NA 131* 133* 136*  K 5.3 5.1 4.3  CL 96 104 101  CO2 21  --  24  GLUCOSE 250* 253* 200*  BUN 35* 37* 31*  CREATININE 1.42* 1.50* 1.34*  CALCIUM 9.0  --  9.0  MG  --   --  2.0   Liver Function Tests:  Recent Labs Lab 02/21/14 1434  AST 30  ALT 12  ALKPHOS 95  BILITOT 0.4  PROT 6.6  ALBUMIN 3.2*   No results for input(s): LIPASE, AMYLASE in the last 168 hours. No results for input(s): AMMONIA in the last 168 hours. CBC:  Recent Labs Lab 02/21/14 1434 02/21/14 1442 02/22/14 0220 02/22/14 0715  WBC 11.4*  --  12.3* 10.7*  NEUTROABS 7.4  --   --   --   HGB 3.9* 5.1* 7.9* 8.1*  HCT 13.7* 15.0* 24.8* 25.2*  MCV 71.0*  --  77.3* 77.1*  PLT 405*  --  382 355   Cardiac Enzymes: No results for input(s): CKTOTAL, CKMB, CKMBINDEX, TROPONINI in the last 168 hours. BNP: Invalid input(s): POCBNP CBG:  Recent Labs Lab 02/21/14 2135  GLUCAP 265*    Recent Results (from the past 240 hour(s))  MRSA PCR Screening     Status: Abnormal   Collection Time: 02/21/14  6:52 PM  Result Value Ref Range Status   MRSA by PCR POSITIVE (A) NEGATIVE Final    Comment:        Scheduled Meds: . insulin aspart  0-9 Units Subcutaneous TID WC  . [START ON 02/23/2014] insulin glargine  18 Units Subcutaneous Q breakfast  . LORazepam  0.5 mg Intravenous Once  . pantoprazole (PROTONIX) IV  40 mg Intravenous Q12H   Continuous Infusions: . sodium chloride 50 mL/hr at 02/21/14 1900

## 2014-02-22 NOTE — Progress Notes (Signed)
Clinical Social Work Department BRIEF PSYCHOSOCIAL ASSESSMENT 02/22/2014  Patient:  [name], [name]     Account Number:  [id number]     Admit date:  02/21/2014  Clinical Social Worker:  Maryln Manuel  Date/Time:  02/22/2014 11:30 AM  Referred by:  Physician  Date Referred:  02/22/2014 Referred for  SNF Placement   Other Referral:   Interview type:  Family Other interview type:    PSYCHOSOCIAL DATA Living Status:  FACILITY Admitted from facility:   Level of care:   Primary support name:  Elta Guadeloupe Pilch/[phone] Primary support relationship to patient:  CHILD, ADULT Degree of support available:   strong    CURRENT CONCERNS Current Concerns  Post-Acute Placement   Other Concerns:    SOCIAL WORK ASSESSMENT / PLAN CSW received referral that pt admitted from [facility].    CSW reviewed chart and noted that pt disoriented x 4. CSW visited pt room and no family present at bedside. CSW contacted pt son, Elta Guadeloupe via telephone. CSW introduced self and explained role. Pt son confirmed that pt a resident at [facility] and is at facility for long term care. Pt son confirmed that plan is for pt to return to [facility] upon discharge. CSW clarified pt son questions and ensured pt son that CSW will keep pt son updated on pt progress and anticipated discharge. Pt son expressed appreciation.    CSW completed FL2 and sent clinicals to [facility]. CSW contacted [facility] and confirmed that facility can accept pt back upon discharge.    CSW to continue to follow to provide support and assist with pt transition back to [facility] when pt medically ready for discharge.   Assessment/plan status:  Psychosocial Support/Ongoing Assessment of Needs Other assessment/ plan:   discharge planning   Information/referral to community resources:   Referral back to [facility]    PATIENT'S/FAMILY'S RESPONSE TO PLAN OF CARE: Pt sleeping and per chart,  disoriented x 4. Pt son supportive and actively involved in pt care. Pt son plans for pt to return to [facility] upon discharge.    Alison Murray, MSW, [employer] [phone]

## 2014-02-22 NOTE — Progress Notes (Signed)
Inpatient Diabetes Program Recommendations  AACE/ADA: New Consensus Statement on Inpatient Glycemic Control (2013)  Target Ranges:  Prepandial:   less than 140 mg/dL      Peak postprandial:   less than 180 mg/dL (1-2 hours)      Critically ill patients:  140 - 180 mg/dL  Results for [name], [name] (MRN [id number]) as of 02/22/2014 09:53  Ref. Range 02/21/2014 21:35  Glucose-Capillary Latest Range: 70-99 mg/dL 265 (H)   Inpatient Diabetes Program Recommendations Insulin - Basal: Please add Lantus 15 units (home dose 18 units) Thank you  Raoul Pitch BSN, RN,CDE Inpatient Diabetes Coordinator [phone] (team pager)

## 2014-02-22 NOTE — Plan of Care (Signed)
Problem: Phase I Progression Outcomes Goal: Pain controlled with appropriate interventions Outcome: Completed/Met Date Met:  02/22/14 Goal: Voiding-avoid urinary catheter unless indicated Outcome: Completed/Met Date Met:  02/22/14 Goal: Hemodynamically stable Outcome: Completed/Met Date Met:  02/22/14

## 2014-02-22 NOTE — Progress Notes (Signed)
Family has repeatedly approached this RN, upset that GI doctor has spoken with the patient who is clearly very confused, and made decisions regarding patient's plan of care (per son and daughter-in-law).  Have notified Dr. Charlies Silvers to please call family in room when she is available.  Will continue to monitor patient.

## 2014-02-23 LAB — URINALYSIS, ROUTINE W REFLEX MICROSCOPIC
Bilirubin Urine: NEGATIVE
GLUCOSE, UA: NEGATIVE mg/dL
Ketones, ur: NEGATIVE mg/dL
LEUKOCYTES UA: NEGATIVE
Nitrite: NEGATIVE
PROTEIN: NEGATIVE mg/dL
SPECIFIC GRAVITY, URINE: 1.012 (ref 1.005–1.030)
UROBILINOGEN UA: 0.2 mg/dL (ref 0.0–1.0)
pH: 5.5 (ref 5.0–8.0)

## 2014-02-23 LAB — URINE MICROSCOPIC-ADD ON

## 2014-02-23 LAB — CBC
HCT: 26.2 % — ABNORMAL LOW (ref 36.0–46.0)
HEMOGLOBIN: 8 g/dL — AB (ref 12.0–15.0)
MCH: 24.4 pg — AB (ref 26.0–34.0)
MCHC: 30.5 g/dL (ref 30.0–36.0)
MCV: 79.9 fL (ref 78.0–100.0)
Platelets: 357 10*3/uL (ref 150–400)
RBC: 3.28 MIL/uL — AB (ref 3.87–5.11)
RDW: 18.5 % — ABNORMAL HIGH (ref 11.5–15.5)
WBC: 11.6 10*3/uL — ABNORMAL HIGH (ref 4.0–10.5)

## 2014-02-23 LAB — BASIC METABOLIC PANEL
Anion gap: 14 (ref 5–15)
BUN: 20 mg/dL (ref 6–23)
CHLORIDE: 102 meq/L (ref 96–112)
CO2: 21 meq/L (ref 19–32)
CREATININE: 0.99 mg/dL (ref 0.50–1.10)
Calcium: 9 mg/dL (ref 8.4–10.5)
GFR calc Af Amer: 60 mL/min — ABNORMAL LOW (ref >90)
GFR calc non Af Amer: 52 mL/min — ABNORMAL LOW (ref >90)
Glucose, Bld: 174 mg/dL — ABNORMAL HIGH (ref 70–99)
Potassium: 4 mEq/L (ref 3.7–5.3)
Sodium: 137 mEq/L (ref 137–147)

## 2014-02-23 LAB — GLUCOSE, CAPILLARY
GLUCOSE-CAPILLARY: 156 mg/dL — AB (ref 70–99)
GLUCOSE-CAPILLARY: 135 mg/dL — AB (ref 70–99)
Glucose-Capillary: 151 mg/dL — ABNORMAL HIGH (ref 70–99)
Glucose-Capillary: 161 mg/dL — ABNORMAL HIGH (ref 70–99)
Glucose-Capillary: 168 mg/dL — ABNORMAL HIGH (ref 70–99)
Glucose-Capillary: 189 mg/dL — ABNORMAL HIGH (ref 70–99)

## 2014-02-23 MED ORDER — LORAZEPAM 2 MG/ML IJ SOLN
1.0000 mg | INTRAMUSCULAR | Status: DC | PRN
  Administered 2014-02-23 – 2014-02-25 (×4): 1 mg via INTRAVENOUS
  Filled 2014-02-23 (×4): qty 1

## 2014-02-23 MED ORDER — LORAZEPAM 2 MG/ML IJ SOLN: 1.0000 mg | Freq: Four times a day (QID) | INTRAMUSCULAR | Status: DC | PRN

## 2014-02-23 NOTE — Evaluation (Signed)
Physical Therapy Evaluation Patient Details Name: [name] MRN: [id number] DOB: April 05, 1930 Today's Date: 02/23/2014   History of Present Illness  80 yo female admitted with GI bleed, weakness. Hx of HTN, NSTEMI, A fib, CVA, MI, gout, anemia, dementia. Pt is from SNF.   Clinical Impression  Bed level eval only-Min assist to roll to L and R with use of bedrail. Total assist for linen exchange due to incontinence. Pt declined sitting EOB and OOB to chair.Noted pt's tray to be sitting on countertop. Pt expressing wish to eat-"I'm so hungry" so set up tray for pt to do so. RN stated it was okay. Recommend return to SNF.     Follow Up Recommendations SNF    Equipment Recommendations  None recommended by PT    Recommendations for Other Services       Precautions / Restrictions Precautions Precautions: Fall Restrictions Weight Bearing Restrictions: No      Mobility  Bed Mobility Overal bed mobility: Needs Assistance Bed Mobility: Rolling Rolling: Min assist         General bed mobility comments: Assist to roll L and R, with use of bedrail. Multimodal cues for technique, hand placement.   Transfers                 General transfer comment: NT-pt declined  Ambulation/Gait             General Gait Details: non ambulatory  Stairs            Wheelchair Mobility    Modified Rankin (Stroke Patients Only)       Balance                                             Pertinent Vitals/Pain Pain Assessment: No/denies pain    Home Living Family/patient expects to be discharged to:: Skilled nursing facility                      Prior Function Level of Independence: Needs assistance   Gait / Transfers Assistance Needed: WC dependent-assist needed per pt.           Hand Dominance        Extremity/Trunk Assessment   Upper Extremity Assessment: Generalized weakness           Lower Extremity Assessment:  Generalized weakness         Communication   Communication: No difficulties  Cognition Arousal/Alertness: Awake/alert Behavior During Therapy: WFL for tasks assessed/performed Overall Cognitive Status: History of cognitive impairments - at baseline                      General Comments      Exercises        Assessment/Plan    PT Assessment Patient needs continued PT services  PT Diagnosis Generalized weakness   PT Problem List Decreased strength;Decreased activity tolerance;Decreased balance;Decreased mobility;Decreased cognition;Decreased knowledge of use of DME  PT Treatment Interventions DME instruction;Functional mobility training;Therapeutic activities;Therapeutic exercise;Wheelchair mobility training;Patient/family education;Balance training   PT Goals (Current goals can be found in the Care Plan section) Acute Rehab PT Goals Patient Stated Goal: to eat! PT Goal Formulation: Patient unable to participate in goal setting Time For Goal Achievement: 03/09/14 Potential to Achieve Goals: Fair    Frequency Min 2X/week   Barriers to discharge  Co-evaluation               End of Session   Activity Tolerance: Patient tolerated treatment well Patient left: in bed;with call bell/phone within reach;with bed alarm set           Time: 1333-1401 PT Time Calculation (min) (ACUTE ONLY): 28 min   Charges:   PT Evaluation $Initial PT Evaluation Tier I: 1 Procedure PT Treatments $Therapeutic Activity: 8-22 mins   PT G Codes:         Weston Anna, MPT Pager: [phone]

## 2014-02-23 NOTE — Plan of Care (Signed)
Problem: Phase II Progression Outcomes Goal: Hemodynamically stable Outcome: Completed/Met Date Met:  02/23/14 Goal: H&H stablized < 1gm drop in 24 hrs Outcome: Completed/Met Date Met:  02/23/14 Goal: Progress activity as tolerated unless otherwise ordered Outcome: Completed/Met Date Met:  02/23/14 Goal: Tolerating diet Outcome: Completed/Met Date Met:  02/23/14 Goal: Other Phase II Outcomes/Goals Outcome: Completed/Met Date Met:  02/23/14

## 2014-02-23 NOTE — Progress Notes (Signed)
Patient ID: [name], female   DOB: 11-02-1929, 80 y.o.   MRN: [id number] [facility] PROGRESS NOTE  [name] VZD:[id number] DOB: 07-05-1929 DOA: 02/21/2014 PCP: Delphina Cahill, MD  Brief narrative: 27 -year-old female with past medical history of atrial fibrillation, CAD, stroke on anticoagulation with xarelto, diabetes who presented from skilled nursing facility to [facility] ED 02/21/2014 with progressive weakness over past couple of days prior to this admission. Patient was noted to have hemoglobin of 4 and was referred for admission. In ED, blood pressure was 112/65, heart rate 45 - 107, T max 98.9 F and oxygen saturation 96% on room air.blood work revealed hemoglobin of 5.1, White blood cell count 11.4, platelets 405, sodium 131 and creatinine 1.42. Patient was transfused 2 units of PRBC since admission. GI to see the patient in consultation.  Assessment/Plan:    Principal problem: Acute blood loss anemia / possible GI bleed - Patient is on anticoagulation with xarelto which was put on hold on the admission. - Patient was transfused 2 units of PRBC since admission with appropriate rise in hemoglobin - No further reports of bleeding. - GI has seen the patient in consultation. Since hemoglobin is stable and patient declined to have endoscopy will hold off on any procedures for now. - continue Protonix 40 mg IV every 12 hours. - advance diet to regular. Active Problems: Atrial fibrillation - On anticoagulation with xarelto as mentioned, on hold due to bleed - rate is currently controlled without beta blockers. Heart rate is 74. History of CAD - no complaints of chest pain. As mentioned, anticoagulation on hold. DM (diabetes mellitus), type 2, uncontrolled - no recent A1c. A1c in June 2014 was 7.5.  - A1c this admission 6.6 indicating good glycemic control - Continue current insulin regimen. Appreciate diabetic coordinator recommendations. Acute renal failure - with superimposed  anemia patient may also have chronic renal failure however we don't have previous recent values for comparison. The last renal function is from 2014 which seemed to be within normal limits. - please note patient is on few nephrotoxic agents including Lasix and lisinopril which could have contributed to worsening renal function. Because of borderline low blood pressure it is reasonable to continue to hold these medications. - renal function improved with IV fluids.  DVT Prophylaxis  - SCD's bilaterally due to bleeding   Code Status: DNR/DNI  Family Communication: plan of care discussed with the patient Disposition Plan: transfer to telemetry floor   IV access:   PeripheralIV  Procedures and diagnostic studies:    Imaging Results (Last 48 hours)    No results found.    Medical Consultants:   Gastroenterology Other Consultants:   None  IAnti-Infectives:    None     Amanda Lenz, MD  [facility] Pager [phone]  If 7PM-7AM, please contact night-coverage [email or web address] Password [facility] 02/23/2014, 12:52 PM   LOS: 2 days    HPI/Subjective: No acute overnight events.  Objective: Filed Vitals:   02/22/14 1029 02/22/14 1506 02/22/14 2034 02/23/14 0446  BP: 132/77 114/56 141/58 149/61  Pulse: 82 68 68 74  Temp: 97.9 F (36.6 C)  97.9 F (36.6 C) 97.9 F (36.6 C)  TempSrc: Oral  Oral Oral  Resp: 19  18 19   Height:      Weight:      SpO2: 96% 91% 98% 98%    Intake/Output Summary (Last 24 hours) at 02/23/14 1252 Last data filed at 02/23/14 0709  Gross per 24 hour  Intake  1107.5 ml  Output      0 ml  Net 1107.5 ml    Exam:   General:  Pt is alert, follows commands appropriately, not in acute distress  Cardiovascular: Regular rate and rhythm, S1/S2 appreciated, SEM appreciated +2/6   Respiratory: Clear to auscultation bilaterally, no wheezing, no crackles, no rhonchi  Abdomen: Soft, non tender, non distended, bowel sounds  present  Extremities: pulses DP and PT palpable bilaterally    Data Reviewed: Basic Metabolic Panel:  Recent Labs Lab 02/21/14 1434 02/21/14 1442 02/22/14 0715 02/23/14 0920  NA 131* 133* 136* 137  K 5.3 5.1 4.3 4.0  CL 96 104 101 102  CO2 21  --  24 21  GLUCOSE 250* 253* 200* 174*  BUN 35* 37* 31* 20  CREATININE 1.42* 1.50* 1.34* 0.99  CALCIUM 9.0  --  9.0 9.0  MG  --   --  2.0  --    Liver Function Tests:  Recent Labs Lab 02/21/14 1434  AST 30  ALT 12  ALKPHOS 95  BILITOT 0.4  PROT 6.6  ALBUMIN 3.2*   No results for input(s): LIPASE, AMYLASE in the last 168 hours. No results for input(s): AMMONIA in the last 168 hours. CBC:  Recent Labs Lab 02/21/14 1434 02/21/14 1442 02/22/14 0220 02/22/14 0715 02/23/14 0920  WBC 11.4*  --  12.3* 10.7* 11.6*  NEUTROABS 7.4  --   --   --   --   HGB 3.9* 5.1* 7.9* 8.1* 8.0*  HCT 13.7* 15.0* 24.8* 25.2* 26.2*  MCV 71.0*  --  77.3* 77.1* 79.9  PLT 405*  --  382 355 357   Cardiac Enzymes: No results for input(s): CKTOTAL, CKMB, CKMBINDEX, TROPONINI in the last 168 hours. BNP: Invalid input(s): POCBNP CBG:  Recent Labs Lab 02/22/14 2030 02/23/14 0007 02/23/14 0403 02/23/14 0745 02/23/14 1213  GLUCAP 173* 151* 156* 168* 135*    Recent Results (from the past 240 hour(s))  MRSA PCR Screening     Status: Abnormal   Collection Time: 02/21/14  6:52 PM  Result Value Ref Range Status   MRSA by PCR POSITIVE (A) NEGATIVE Final    Comment:        The GeneXpert MRSA Assay (FDA approved for NASAL specimens only), is one component of a comprehensive MRSA colonization surveillance program. It is not intended to diagnose MRSA infection nor to guide or monitor treatment for MRSA infections. RESULT CALLED TO, READ BACK BY AND VERIFIED WITH: Lauretta Grill RN [phone] 02/21/14 A NAVARRO      Scheduled Meds: . Chlorhexidine Gluconate Cloth  6 each Topical Q0600  . insulin aspart  0-9 Units Subcutaneous TID WC  . insulin  glargine  18 Units Subcutaneous Q breakfast  . LORazepam  0.5 mg Intravenous Once  . mupirocin ointment  1 application Nasal BID  . pantoprazole (PROTONIX) IV  40 mg Intravenous Q12H   Continuous Infusions: . sodium chloride 50 mL/hr at 02/23/14 1008

## 2014-02-24 DIAGNOSIS — D72829 Elevated white blood cell count, unspecified: Secondary | ICD-10-CM

## 2014-02-24 LAB — GLUCOSE, CAPILLARY
GLUCOSE-CAPILLARY: 194 mg/dL — AB (ref 70–99)
GLUCOSE-CAPILLARY: 162 mg/dL — AB (ref 70–99)
GLUCOSE-CAPILLARY: 89 mg/dL (ref 70–99)
Glucose-Capillary: 172 mg/dL — ABNORMAL HIGH (ref 70–99)
Glucose-Capillary: 207 mg/dL — ABNORMAL HIGH (ref 70–99)
Glucose-Capillary: 96 mg/dL (ref 70–99)

## 2014-02-24 LAB — CBC
HCT: 26.2 % — ABNORMAL LOW (ref 36.0–46.0)
Hemoglobin: 8 g/dL — ABNORMAL LOW (ref 12.0–15.0)
MCH: 24 pg — ABNORMAL LOW (ref 26.0–34.0)
MCHC: 30.5 g/dL (ref 30.0–36.0)
MCV: 78.7 fL (ref 78.0–100.0)
PLATELETS: 311 10*3/uL (ref 150–400)
RBC: 3.33 MIL/uL — AB (ref 3.87–5.11)
RDW: 19.1 % — ABNORMAL HIGH (ref 11.5–15.5)
WBC: 15.5 10*3/uL — AB (ref 4.0–10.5)

## 2014-02-24 LAB — BASIC METABOLIC PANEL
ANION GAP: 16 — AB (ref 5–15)
BUN: 16 mg/dL (ref 6–23)
CALCIUM: 8.9 mg/dL (ref 8.4–10.5)
CHLORIDE: 102 meq/L (ref 96–112)
CO2: 18 meq/L — AB (ref 19–32)
Creatinine, Ser: 0.88 mg/dL (ref 0.50–1.10)
GFR calc Af Amer: 69 mL/min — ABNORMAL LOW (ref >90)
GFR calc non Af Amer: 60 mL/min — ABNORMAL LOW (ref >90)
Glucose, Bld: 201 mg/dL — ABNORMAL HIGH (ref 70–99)
Potassium: 4 mEq/L (ref 3.7–5.3)
SODIUM: 136 meq/L — AB (ref 137–147)

## 2014-02-24 NOTE — Progress Notes (Addendum)
Patient ID: [name], female   DOB: 29-May-1929, 80 y.o.   MRN: [id number] [facility] PROGRESS NOTE  [name] KDX:[id number] DOB: 19-Jun-1929 DOA: 02/21/2014 PCP: Delphina Cahill, MD  Brief narrative: 46 -year-old female with past medical history of atrial fibrillation, CAD, stroke on anticoagulation with xarelto, diabetes who presented from skilled nursing facility to [facility] ED 02/21/2014 with progressive weakness over past couple of days prior to this admission. Patient was noted to have hemoglobin of 4 and was referred for admission. In ED, blood pressure was 112/65, heart rate 45 - 107, T max 98.9 F and oxygen saturation 96% on room air.blood work revealed hemoglobin of 5.1, White blood cell count 11.4, platelets 405, sodium 131 and creatinine 1.42. Patient was transfused 2 units of PRBC since admission. Hospital course complicated with acute onset confusion and agitation 02/23/2014.  Assessment/Plan:    Principal problem: Acute blood loss anemia / possible GI bleed - Patient is on anticoagulation with xarelto which was put on hold on the admission. - Patient was transfused 2 units of PRBC since admission with appropriate rise in hemoglobin - No further reports of bleeding. - GI has seen the patient in consultation. Since hemoglobin is stable and patient declined to have endoscopy will hold off on any procedures for now. - continue Protonix 40 mg IV every 12 hours. - diet as tolerated - if Hgb continues to be stable for next 24 hours we will restart xarelto and monitor for bleed in hospital. Active Problems: Atrial fibrillation - On anticoagulation with xarelto as mentioned, on hold due to admission for bleed - rate is currently controlled without beta blockers. Heart rate is 74. History of CAD - no complaints of chest pain. As mentioned, anticoagulation on hold. DM (diabetes mellitus), type 2, uncontrolled - no recent A1c. A1c in June 2014 was 7.5.  - A1c this admission 6.6  indicating good glycemic control - continue Lantus 18 untis daily with SSI. - Appreciate diabetic coordinator recommendations. Acute renal failure - with superimposed anemia patient may also have chronic renal failure however we don't have previous recent values for comparison. The last renal function is from 2014 which seemed to be within normal limits. - please note patient is on few nephrotoxic agents including Lasix and lisinopril which could have contributed to worsening renal function. Because of borderline low blood pressure it is reasonable to continue to hold these medications. - renal function improved with IV fluids. Leukocytosis - unclear etiology - no evidence of UTI on urinalysis - continue to monitor CBC Acute encephalopathy - unclear etiology, has underlying dementia - continue to monitor - ativan as needed   DVT Prophylaxis  - SCD's bilaterally due to bleeding   Code Status: DNR/DNI  Family Communication: plan of care discussed with the patient Disposition Plan: transfer to telemetry floor   IV access:   PeripheralIV  Procedures and diagnostic studies:    Imaging Results (Last 48 hours)    No results found.    Medical Consultants:   Gastroenterology Other Consultants:   None  IAnti-Infectives:    None   [name], MD  [facility] Pager [phone]  If 7PM-7AM, please contact night-coverage [email or web address] Password [facility] 02/24/2014, 8:36 AM   LOS: 3 days    HPI/Subjective: No acute overnight events.  Objective: Filed Vitals:   02/23/14 0446 02/23/14 1532 02/23/14 2020 02/24/14 0515  BP: 149/61 139/57 142/64 155/65  Pulse: 74 87 82 90  Temp: 97.9 F (36.6 C) 97.7 F (36.5  C) 98.2 F (36.8 C) 98.2 F (36.8 C)  TempSrc: Oral Oral Oral Oral  Resp: 19 18 18 18   Height:      Weight:      SpO2: 98%  98% 95%    Intake/Output Summary (Last 24 hours) at 02/24/14 0836 Last data filed at 02/24/14 0600  Gross  per 24 hour  Intake   1570 ml  Output      0 ml  Net   1570 ml    Exam:   General:  Pt is sleeping this am, not in acute distress  Cardiovascular: Regular rate and rhythm, S1/S2, no murmurs  Respiratory: Clear to auscultation bilaterally, no wheezing, no crackles, no rhonchi  Abdomen: Soft, non tender, non distended, bowel sounds present  Extremities: No edema, pulses DP and PT palpable bilaterally  Neuro: Grossly nonfocal  Data Reviewed: Basic Metabolic Panel:  Recent Labs Lab 02/21/14 1434 02/21/14 1442 02/22/14 0715 02/23/14 0920 02/24/14 0423  NA 131* 133* 136* 137 136*  K 5.3 5.1 4.3 4.0 4.0  CL 96 104 101 102 102  CO2 21  --  24 21 18*  GLUCOSE 250* 253* 200* 174* 201*  BUN 35* 37* 31* 20 16  CREATININE 1.42* 1.50* 1.34* 0.99 0.88  CALCIUM 9.0  --  9.0 9.0 8.9  MG  --   --  2.0  --   --    Liver Function Tests:  Recent Labs Lab 02/21/14 1434  AST 30  ALT 12  ALKPHOS 95  BILITOT 0.4  PROT 6.6  ALBUMIN 3.2*   No results for input(s): LIPASE, AMYLASE in the last 168 hours. No results for input(s): AMMONIA in the last 168 hours. CBC:  Recent Labs Lab 02/21/14 1434 02/21/14 1442 02/22/14 0220 02/22/14 0715 02/23/14 0920 02/24/14 0423  WBC 11.4*  --  12.3* 10.7* 11.6* 15.5*  NEUTROABS 7.4  --   --   --   --   --   HGB 3.9* 5.1* 7.9* 8.1* 8.0* 8.0*  HCT 13.7* 15.0* 24.8* 25.2* 26.2* 26.2*  MCV 71.0*  --  77.3* 77.1* 79.9 78.7  PLT 405*  --  382 355 357 311   Cardiac Enzymes: No results for input(s): CKTOTAL, CKMB, CKMBINDEX, TROPONINI in the last 168 hours. BNP: Invalid input(s): POCBNP CBG:  Recent Labs Lab 02/23/14 1617 02/23/14 2024 02/23/14 2357 02/24/14 0523 02/24/14 0746  GLUCAP 161* 189* 172* 194* 207*    Recent Results (from the past 240 hour(s))  MRSA PCR Screening     Status: Abnormal   Collection Time: 02/21/14  6:52 PM  Result Value Ref Range Status   MRSA by PCR POSITIVE (A) NEGATIVE Final    Comment:        The  GeneXpert MRSA Assay (FDA approved for NASAL specimens only), is one component of a comprehensive MRSA colonization surveillance program. It is not intended to diagnose MRSA infection nor to guide or monitor treatment for MRSA infections. RESULT CALLED TO, READ BACK BY AND VERIFIED WITH: Lauretta Grill RN [id number] 02/21/14 A NAVARRO      Scheduled Meds: . Chlorhexidine Gluconate Cloth  6 each Topical Q0600  . insulin aspart  0-9 Units Subcutaneous TID WC  . insulin glargine  18 Units Subcutaneous Q breakfast  . LORazepam  0.5 mg Intravenous Once  . mupirocin ointment  1 application Nasal BID  . pantoprazole (PROTONIX) IV  40 mg Intravenous Q12H   Continuous Infusions: . sodium chloride 50 mL/hr at 02/24/14 0417

## 2014-02-24 NOTE — Progress Notes (Signed)
CSW following for return to [facility] when medically ready. CSW has completed FL2 & will continue to follow and assist with return.  CSW confirmed that facility is able to accept patient back over the weekend if ready.    Raynaldo Opitz, [facility] Clinical Social Worker cell #: [phone]

## 2014-02-24 NOTE — Plan of Care (Signed)
Problem: Phase I Progression Outcomes Goal: Initial discharge plan identified Outcome: Completed/Met Date Met:  02/24/14     

## 2014-02-25 DIAGNOSIS — D62 Acute posthemorrhagic anemia: Secondary | ICD-10-CM

## 2014-02-25 LAB — CBC
HCT: 28.6 % — ABNORMAL LOW (ref 36.0–46.0)
Hemoglobin: 8.8 g/dL — ABNORMAL LOW (ref 12.0–15.0)
MCH: 24.2 pg — ABNORMAL LOW (ref 26.0–34.0)
MCHC: 30.8 g/dL (ref 30.0–36.0)
MCV: 78.6 fL (ref 78.0–100.0)
PLATELETS: 403 10*3/uL — AB (ref 150–400)
RBC: 3.64 MIL/uL — ABNORMAL LOW (ref 3.87–5.11)
RDW: 19.4 % — ABNORMAL HIGH (ref 11.5–15.5)
WBC: 14 10*3/uL — ABNORMAL HIGH (ref 4.0–10.5)

## 2014-02-25 LAB — GLUCOSE, CAPILLARY
GLUCOSE-CAPILLARY: 95 mg/dL (ref 70–99)
GLUCOSE-CAPILLARY: 113 mg/dL — AB (ref 70–99)
GLUCOSE-CAPILLARY: 103 mg/dL — AB (ref 70–99)
GLUCOSE-CAPILLARY: 65 mg/dL — AB (ref 70–99)
Glucose-Capillary: 133 mg/dL — ABNORMAL HIGH (ref 70–99)
Glucose-Capillary: 147 mg/dL — ABNORMAL HIGH (ref 70–99)
Glucose-Capillary: 82 mg/dL (ref 70–99)

## 2014-02-25 MED ORDER — DEXTROSE 50 % IV SOLN
INTRAVENOUS | Status: AC
  Filled 2014-02-25: qty 50

## 2014-02-25 MED ORDER — HALOPERIDOL LACTATE 5 MG/ML IJ SOLN
1.0000 mg | Freq: Four times a day (QID) | INTRAMUSCULAR | Status: DC | PRN
  Administered 2014-02-27: 1 mg via INTRAVENOUS
  Filled 2014-02-25: qty 1

## 2014-02-25 MED ORDER — DEXTROSE 50 % IV SOLN
25.0000 mL | Freq: Once | INTRAVENOUS | Status: AC | PRN
  Administered 2014-02-25: 25 mL via INTRAVENOUS

## 2014-02-25 NOTE — Progress Notes (Signed)
Patient ID: [name], female   DOB: 08-18-1929, 80 y.o.   MRN: [id number] [facility] PROGRESS NOTE  KIENNA MONCADA HUD:[id number] DOB: 1930-01-30 DOA: 02/21/2014 PCP: Delphina Cahill, MD  Brief narrative: 83 -year-old female with past medical history of atrial fibrillation, CAD, stroke on anticoagulation with xarelto, diabetes who presented from skilled nursing facility to [facility] ED 02/21/2014 with progressive weakness over past couple of days prior to this admission. Patient was noted to have hemoglobin of 4 and was referred for admission. In ED, blood pressure was 112/65, heart rate 45 - 107, T max 98.9 F and oxygen saturation 96% on room air.blood work revealed hemoglobin of 5.1, White blood cell count 11.4, platelets 405, sodium 131 and creatinine 1.42. Patient was transfused 2 units of PRBC since admission. Hospital course complicated with acute onset confusion and agitation started 02/23/2014.  Assessment/Plan:    Principal problem: Acute blood loss anemia / GI bleed - Likely triggered by xarelto which was put on hold - Patient was transfused 2 units of PRBC since admission with appropriate rise in hemoglobin - No further reports of bleeding. - GI has seen the patient in consultation. Since hemoglobin is stable and patient declined to have endoscopy will hold off on any procedures for now. - continue Protonix 40 mg IV every 12 hours. - check CBC today; if stable will resume xarelto and monitor for bleed  Active Problems: Atrial fibrillation - On anticoagulation with xarelto as mentioned, on hold due to admission for bleed - rate controlled without beta blocker  History of CAD - no complaints of chest pain. As mentioned, anticoagulation on hold. DM (diabetes mellitus), type 2, uncontrolled - no recent A1c. A1c in June 2014 was 7.5.  - A1c this admission 6.6 indicating good glycemic control - continue Lantus 18 untis daily with SSI. - Appreciate diabetic coordinator  recommendations. - CBG's in pat 24 hours: 95, 113, 103 Acute renal failure - with superimposed anemia patient may also have chronic renal failure however we don't have previous recent values for comparison. The last renal function is from 2014 which seemed to be within normal limits. - please note patient is on few nephrotoxic agents including Lasix and lisinopril which could have contributed to worsening renal function. Because of borderline low blood pressure we held those meds. - renal function WNL in past 48 hours.   Leukocytosis - unclear etiology - no evidence of UTI on urinalysis - no fevers.  Acute encephalopathy - unclear etiology, has underlying dementia - continue to monitor - switched form ativan to haldol   DVT Prophylaxis  - SCD's bilaterally due to bleeding   Code Status: DNR/DNI  Family Communication: plan of care discussed with the patient's family over the phone  Disposition Plan: likely D/C in next 24-48 hours.    IV access:   Peripheral IV  Procedures and diagnostic studies:    No results found.  Medical Consultants:   None  Other Consultants:   Physical therapy  Social Work IAnti-Infectives:    None    [name], MD  [facility] Pager [phone]  If 7PM-7AM, please contact night-coverage [email or web address] Password [email or web address] 02/25/2014, 12:45 PM   LOS: 4 days    HPI/Subjective: Agitated, confused, restless intermittently per RN report.   Objective: Filed Vitals:   02/24/14 0515 02/24/14 1438 02/24/14 2050 02/25/14 0444  BP: 155/65 156/64 171/90 147/63  Pulse: 90 99 109 91  Temp: 98.2 F (36.8 C) 97.7 F (36.5 C) 97.5 F (36.4 C)  98 F (36.7 C)  TempSrc: Oral Axillary Axillary Axillary  Resp: 18 19 18 18   Height:      Weight:      SpO2: 95% 95% 92% 97%    Intake/Output Summary (Last 24 hours) at 02/25/14 1245 Last data filed at 02/25/14 1016  Gross per 24 hour  Intake    400 ml  Output      1 ml  Net    399 ml     Exam:   General:  Pt is sleeping, no distress  Cardiovascular: Regular rate and rhythm, S1/S2 appreciated   Respiratory: no wheezing, no crackles, no rhonchi  Abdomen: on distended, bowel sounds present  Extremities: pulses palpable   Neuro: non focal   Data Reviewed: Basic Metabolic Panel:  Recent Labs Lab 02/21/14 1434 02/21/14 1442 02/22/14 0715 02/23/14 0920 02/24/14 0423  NA 131* 133* 136* 137 136*  K 5.3 5.1 4.3 4.0 4.0  CL 96 104 101 102 102  CO2 21  --  24 21 18*  GLUCOSE 250* 253* 200* 174* 201*  BUN 35* 37* 31* 20 16  CREATININE 1.42* 1.50* 1.34* 0.99 0.88  CALCIUM 9.0  --  9.0 9.0 8.9  MG  --   --  2.0  --   --    Liver Function Tests:  Recent Labs Lab 02/21/14 1434  AST 30  ALT 12  ALKPHOS 95  BILITOT 0.4  PROT 6.6  ALBUMIN 3.2*   No results for input(s): LIPASE, AMYLASE in the last 168 hours. No results for input(s): AMMONIA in the last 168 hours. CBC:  Recent Labs Lab 02/21/14 1434 02/21/14 1442 02/22/14 0220 02/22/14 0715 02/23/14 0920 02/24/14 0423  WBC 11.4*  --  12.3* 10.7* 11.6* 15.5*  NEUTROABS 7.4  --   --   --   --   --   HGB 3.9* 5.1* 7.9* 8.1* 8.0* 8.0*  HCT 13.7* 15.0* 24.8* 25.2* 26.2* 26.2*  MCV 71.0*  --  77.3* 77.1* 79.9 78.7  PLT 405*  --  382 355 357 311   Cardiac Enzymes: No results for input(s): CKTOTAL, CKMB, CKMBINDEX, TROPONINI in the last 168 hours. BNP: Invalid input(s): POCBNP CBG:  Recent Labs Lab 02/24/14 2048 02/25/14 0007 02/25/14 0445 02/25/14 0742 02/25/14 1203  GLUCAP 96 82 95 113* 103*    Recent Results (from the past 240 hour(s))  MRSA PCR Screening     Status: Abnormal   Collection Time: 02/21/14  6:52 PM  Result Value Ref Range Status   MRSA by PCR POSITIVE (A) NEGATIVE Final    Comment:           Scheduled Meds: . insulin aspart  0-9 Units Subcutaneous TID WC  . insulin glargine  18 Units Subcutaneous Q breakfast  . pantoprazole (PROTONIX) IV  40 mg Intravenous Q12H    Continuous Infusions: . sodium chloride 50 mL/hr at 02/25/14 0022

## 2014-02-25 NOTE — Plan of Care (Signed)
Problem: Phase III Progression Outcomes Goal: H&H stablized <1gm drop in 24 hrs, no active bleeding Outcome: Progressing

## 2014-02-26 DIAGNOSIS — I10 Essential (primary) hypertension: Secondary | ICD-10-CM

## 2014-02-26 LAB — GLUCOSE, CAPILLARY
GLUCOSE-CAPILLARY: 94 mg/dL (ref 70–99)
GLUCOSE-CAPILLARY: 90 mg/dL (ref 70–99)
Glucose-Capillary: 104 mg/dL — ABNORMAL HIGH (ref 70–99)
Glucose-Capillary: 84 mg/dL (ref 70–99)
Glucose-Capillary: 91 mg/dL (ref 70–99)
Glucose-Capillary: 92 mg/dL (ref 70–99)

## 2014-02-26 MED ORDER — METOPROLOL TARTRATE 12.5 MG HALF TABLET
12.5000 mg | ORAL_TABLET | Freq: Two times a day (BID) | ORAL | Status: DC
  Administered 2014-02-27: 12.5 mg via ORAL
  Filled 2014-02-26 (×5): qty 1

## 2014-02-26 MED ORDER — HYDRALAZINE HCL 20 MG/ML IJ SOLN
5.0000 mg | INTRAMUSCULAR | Status: DC
  Administered 2014-02-26 – 2014-02-27 (×6): 5 mg via INTRAVENOUS
  Filled 2014-02-26 (×4): qty 0.25
  Filled 2014-02-26: qty 1
  Filled 2014-02-26 (×7): qty 0.25
  Filled 2014-02-26: qty 1

## 2014-02-26 MED ORDER — RIVAROXABAN 20 MG PO TABS
20.0000 mg | ORAL_TABLET | Freq: Every day | ORAL | Status: DC
  Filled 2014-02-26 (×3): qty 1

## 2014-02-26 MED ORDER — RIVAROXABAN 20 MG PO TABS: 20.0000 mg | ORAL_TABLET | Freq: Every day | ORAL | Status: DC

## 2014-02-26 NOTE — Plan of Care (Signed)
Problem: Phase III Progression Outcomes Goal: Activity at appropriate level-compared to baseline (UP IN CHAIR FOR HEMODIALYSIS)  Outcome: Completed/Met Date Met:  02/26/14     

## 2014-02-26 NOTE — Progress Notes (Signed)
Physical Therapy Treatment Patient Details Name: [name] MRN: [id number] DOB: 20-Jan-1930 Today's Date: 02/26/2014    History of Present Illness 80 yo female admitted with GI bleed, weakness. Hx of HTN, NSTEMI, A fib, CVA, MI, gout, anemia, dementia. Pt is from SNF.     PT Comments    Pt presents differently today vs last session. Pt was repeating "help me" over and over. When therapist asked pt what she needed pt stated " I don't know." Pt attempting to mobilize so assisted pt to sitting at EOB. Sat ~5 minutes then assisted pt back to supine to safely perform hygiene tasks and linen exchange with assistance from nurse tech. Recommend return to SNF.   Follow Up Recommendations  SNF     Equipment Recommendations  None recommended by PT    Recommendations for Other Services       Precautions / Restrictions Precautions Precautions: Fall Restrictions Weight Bearing Restrictions: No    Mobility  Bed Mobility Overal bed mobility: Needs Assistance Bed Mobility: Supine to Sit;Sit to Supine;Rolling Rolling: Min assist   Supine to sit: Mod assist Sit to supine: Mod assist   General bed mobility comments: Assist to roll L and R, with use of bedrail. Assist for trunk and bil LEs for supine<>sit. Multimodal cues for technique, hand placement.   Transfers                    Ambulation/Gait                 Stairs            Wheelchair Mobility    Modified Rankin (Stroke Patients Only)       Balance Overall balance assessment: Needs assistance Sitting-balance support: Feet supported Sitting balance-Leahy Scale: Fair Sitting balance - Comments: sat EOB ~5 minutes with Min guard assist. Very flexed neck and trunk posture noted.                             Cognition Arousal/Alertness: Awake/alert Behavior During Therapy: Restless (pt repeating "help me" over and over) Overall Cognitive Status: History of cognitive impairments - at  baseline                      Exercises      General Comments        Pertinent Vitals/Pain Pain Assessment: Faces Faces Pain Scale: Hurts little more Pain Location: pt unable to report pain location. pt looked uncomfortable and kept repeating "help me" Pain Intervention(s): Repositioned    Home Living                      Prior Function            PT Goals (current goals can now be found in the care plan section) Progress towards PT goals: Not progressing toward goals - comment (pt restless this session; only repeated "help me" over and over)    Frequency  Min 2X/week    PT Plan Current plan remains appropriate    Co-evaluation             End of Session   Activity Tolerance: Patient tolerated treatment well Patient left: in bed;with call bell/phone within reach;with bed alarm set;with nursing/sitter in room     Time: 6384-6659 PT Time Calculation (min) (ACUTE ONLY): 16 min  Charges:  $Therapeutic Activity: 8-22 mins  G Codes:      Weston Anna, MPT Pager: [phone]

## 2014-02-26 NOTE — Progress Notes (Signed)
Patient ID: [name], female   DOB: 13-Jan-1930, 80 y.o.   MRN: [id number] [facility] PROGRESS NOTE  [name] EPP:[id number] DOB: 1929-10-22 DOA: 02/21/2014 PCP: Delphina Cahill, MD  Brief narrative: 49 -year-old female with past medical history of atrial fibrillation, CAD, stroke on anticoagulation with xarelto, diabetes who presented from skilled nursing facility to [facility] ED 02/21/2014 with progressive weakness over past couple of days prior to this admission. Patient was noted to have hemoglobin of 4 and was referred for admission. In ED, blood pressure was 112/65, heart rate 45 - 107, T max 98.9 F and oxygen saturation 96% on room air.blood work revealed hemoglobin of 5.1, White blood cell count 11.4, platelets 405, sodium 131 and creatinine 1.42. Patient was transfused 2 units of PRBC since admission. Hospital course complicated with acute onset confusion and agitation started 02/23/2014.  Assessment/Plan:    Principal problem: Acute blood loss anemia / GI bleed - Likely triggered by xarelto which was put on hold. Since no further bleeding will resume xarelto and monitor for bleed. - Patient was transfused 2 units of PRBC since admission with appropriate rise in hemoglobin. - GI has seen the patient in consultation. Since hemoglobin is stable and patient declined to have endoscopy will hold off on any procedures for now. - continue Protonix 40 mg IV every 12 hours. Active Problems: Atrial fibrillation - On anticoagulation with xarelto. Will resume xarelto today. - patient had period of HR of 108. Will start low dose metoprolol. BP this am 169/75. History of CAD - no complaints of chest pain. Stable. DM (diabetes mellitus), type 2, uncontrolled - no recent A1c. A1c in June 2014 was 7.5.  - A1c this admission 6.6 indicating good glycemic control - continue Lantus 18 untis daily with SSI. - Appreciate diabetic coordinator recommendations. Acute renal failure - with superimposed  anemia patient may also have chronic renal failure however we don't have previous recent values for comparison. The last renal function is from 2014 which seemed to be within normal limits. - please note patient is on few nephrotoxic agents including Lasix and lisinopril which could have contributed to worsening renal function. Because of borderline low blood pressure we held those meds. - renal function WNL Leukocytosis - unclear etiology - no evidence of UTI on urinalysis - no fevers.  Acute encephalopathy - unclear etiology, has underlying dementia - continue to monitor - switched form ativan to haldol   DVT Prophylaxis  - SCD's bilaterally due to bleeding   Code Status: DNR/DNI  Family Communication: plan of care discussed with the patient's family over the phone  Disposition Plan: likely D/C in next 24 hours    IV access:   Peripheral IV  Procedures and diagnostic studies:    Imaging Results (Last 48 hours)    No results found.   Medical Consultants:   None  Other Consultants:   Physical therapy  Social Work IAnti-Infectives:    None    [name], MD  [facility] Pager [phone]  If 7PM-7AM, please contact night-coverage [email or web address] Password [email or web address] 02/26/2014, 11:29 AM   LOS: 5 days    HPI/Subjective: No acute overnight events.  Objective: Filed Vitals:   02/25/14 0444 02/25/14 1505 02/25/14 2038 02/26/14 0403  BP: 147/63 147/73 164/80 169/75  Pulse: 91 87 108 94  Temp: 98 F (36.7 C) 98.3 F (36.8 C) 97.3 F (36.3 C) 98.1 F (36.7 C)  TempSrc: Axillary Axillary Axillary Axillary  Resp: 18 20 20 20   Height:  Weight:      SpO2: 97% 96% 99% 98%    Intake/Output Summary (Last 24 hours) at 02/26/14 1129 Last data filed at 02/26/14 0845  Gross per 24 hour  Intake 1257.5 ml  Output      0 ml  Net 1257.5 ml    Exam:   General:  Pt is not in acute distress  Cardiovascular: Regular rate and rhythm,  S1/S2 appreciated   Respiratory: no wheezing, no crackles, no rhonchi  Abdomen: non distended, bowel sounds present  Extremities: No edema, pulses DP and PT palpable bilaterally   Data Reviewed: Basic Metabolic Panel:  Recent Labs Lab 02/21/14 1434 02/21/14 1442 02/22/14 0715 02/23/14 0920 02/24/14 0423  NA 131* 133* 136* 137 136*  K 5.3 5.1 4.3 4.0 4.0  CL 96 104 101 102 102  CO2 21  --  24 21 18*  GLUCOSE 250* 253* 200* 174* 201*  BUN 35* 37* 31* 20 16  CREATININE 1.42* 1.50* 1.34* 0.99 0.88  CALCIUM 9.0  --  9.0 9.0 8.9  MG  --   --  2.0  --   --    Liver Function Tests:  Recent Labs Lab 02/21/14 1434  AST 30  ALT 12  ALKPHOS 95  BILITOT 0.4  PROT 6.6  ALBUMIN 3.2*   No results for input(s): LIPASE, AMYLASE in the last 168 hours. No results for input(s): AMMONIA in the last 168 hours. CBC:  Recent Labs Lab 02/21/14 1434  02/22/14 0220 02/22/14 0715 02/23/14 0920 02/24/14 0423 02/25/14 1327  WBC 11.4*  --  12.3* 10.7* 11.6* 15.5* 14.0*  NEUTROABS 7.4  --   --   --   --   --   --   HGB 3.9*  < > 7.9* 8.1* 8.0* 8.0* 8.8*  HCT 13.7*  < > 24.8* 25.2* 26.2* 26.2* 28.6*  MCV 71.0*  --  77.3* 77.1* 79.9 78.7 78.6  PLT 405*  --  382 355 357 311 403*  < > = values in this interval not displayed. Cardiac Enzymes: No results for input(s): CKTOTAL, CKMB, CKMBINDEX, TROPONINI in the last 168 hours. BNP: Invalid input(s): POCBNP CBG:  Recent Labs Lab 02/25/14 1833 02/25/14 2030 02/26/14 0015 02/26/14 0359 02/26/14 0803  GLUCAP 133* 147* 104* 84 94    Recent Results (from the past 240 hour(s))  MRSA PCR Screening     Status: Abnormal   Collection Time: 02/21/14  6:52 PM  Result Value Ref Range Status   MRSA by PCR POSITIVE (A) NEGATIVE Final    Comment:        The GeneXpert MRSA Assay (FDA approved for NASAL specimens only), is one component of a comprehensive MRSA colonization surveillance program. It is not intended to diagnose  MRSA infection nor to guide or monitor treatment for MRSA infections. RESULT CALLED TO, READ BACK BY AND VERIFIED WITH: Lauretta Grill RN [id number] 02/21/14 A NAVARRO      Scheduled Meds: . Chlorhexidine Gluconate Cloth  6 each Topical Q0600  . insulin aspart  0-9 Units Subcutaneous TID WC  . insulin glargine  18 Units Subcutaneous Q breakfast  . mupirocin ointment  1 application Nasal BID  . pantoprazole (PROTONIX) IV  40 mg Intravenous Q12H  . rivaroxaban  20 mg Oral Q breakfast   Continuous Infusions: . sodium chloride 50 mL/hr at 02/25/14 2051

## 2014-02-27 LAB — CBC
HCT: 25 % — ABNORMAL LOW (ref 36.0–46.0)
Hemoglobin: 7.7 g/dL — ABNORMAL LOW (ref 12.0–15.0)
MCH: 24.1 pg — AB (ref 26.0–34.0)
MCHC: 30.8 g/dL (ref 30.0–36.0)
MCV: 78.1 fL (ref 78.0–100.0)
PLATELETS: 397 10*3/uL (ref 150–400)
RBC: 3.2 MIL/uL — ABNORMAL LOW (ref 3.87–5.11)
RDW: 20.3 % — ABNORMAL HIGH (ref 11.5–15.5)
WBC: 14.9 10*3/uL — ABNORMAL HIGH (ref 4.0–10.5)

## 2014-02-27 LAB — GLUCOSE, CAPILLARY
Glucose-Capillary: 109 mg/dL — ABNORMAL HIGH (ref 70–99)
Glucose-Capillary: 124 mg/dL — ABNORMAL HIGH (ref 70–99)
Glucose-Capillary: 129 mg/dL — ABNORMAL HIGH (ref 70–99)
Glucose-Capillary: 140 mg/dL — ABNORMAL HIGH (ref 70–99)
Glucose-Capillary: 76 mg/dL (ref 70–99)

## 2014-02-27 LAB — PREPARE RBC (CROSSMATCH)

## 2014-02-27 MED ORDER — MIRTAZAPINE 15 MG PO TABS: 15.0000 mg | ORAL_TABLET | Freq: Every day | ORAL | Status: AC

## 2014-02-27 MED ORDER — HALOPERIDOL 0.5 MG PO TABS: 0.5000 mg | ORAL_TABLET | Freq: Every day | ORAL | Status: AC

## 2014-02-27 MED ORDER — LORAZEPAM 0.5 MG PO TABS: 0.5000 mg | ORAL_TABLET | Freq: Two times a day (BID) | ORAL | Status: AC | PRN

## 2014-02-27 MED ORDER — INSULIN ASPART 100 UNIT/ML ~~LOC~~ SOLN: 0.0000 [IU] | Freq: Three times a day (TID) | SUBCUTANEOUS | Status: AC

## 2014-02-27 MED ORDER — LORAZEPAM 2 MG/ML IJ SOLN
1.0000 mg | Freq: Once | INTRAMUSCULAR | Status: AC
  Administered 2014-02-27: 1 mg via INTRAMUSCULAR
  Filled 2014-02-27: qty 1

## 2014-02-27 MED ORDER — HALOPERIDOL LACTATE 5 MG/ML IJ SOLN: 1.5000 mg | Freq: Four times a day (QID) | INTRAMUSCULAR | Status: DC | PRN

## 2014-02-27 MED ORDER — PANTOPRAZOLE SODIUM 40 MG PO TBEC: 40.0000 mg | DELAYED_RELEASE_TABLET | Freq: Every day | ORAL | Status: AC

## 2014-02-27 MED ORDER — SODIUM CHLORIDE 0.9 % IV SOLN: Freq: Once | INTRAVENOUS | Status: AC

## 2014-02-27 MED ORDER — METOPROLOL TARTRATE 12.5 MG HALF TABLET: 12.5000 mg | ORAL_TABLET | Freq: Two times a day (BID) | ORAL | Status: AC

## 2014-02-27 MED ORDER — CHLORHEXIDINE GLUCONATE CLOTH 2 % EX PADS: 6.0000 | MEDICATED_PAD | Freq: Every day | CUTANEOUS | Status: AC

## 2014-02-27 NOTE — Discharge Instructions (Signed)

## 2014-02-27 NOTE — Progress Notes (Signed)
Pt stable, awaiting pts transfer to [facility]. Will continue to monitor.

## 2014-02-27 NOTE — Progress Notes (Signed)
Upon PTAR coming to get pt a red splotchy rash was noted on both of pts butt cheeks. Antifungal power applied. Also pt was agitated trying to hit bite and spit on PTARs paged NP on call and got order for ativan IM.

## 2014-02-27 NOTE — Progress Notes (Signed)
Patient is set to discharge to [facility] today. Patient aware, CSW left message for son. Discharge packet given to RN, Janett Billow .RN to call transport when ready.    Glorious Peach BSW Intern

## 2014-02-27 NOTE — Discharge Summary (Signed)
Physician Discharge Summary  [name] XHB:[id number] DOB: [birth year] DOA: 02/21/2014  PCP: Delphina Cahill, MD  Admit date: 02/21/2014 Discharge date: 02/27/2014  Recommendations for Outpatient Follow-up:  1. Minimize nephrotoxic agents, Lasix on hold but may continue lisinopril. Kidney function has normalized prior to discharge. 2. Take metoprolol 12.5 mg twice daily for better blood pressure control and tachycardia. 3. May use either Haldol or Ativan as needed for agitation or restlessness but please do not use to medications at the same time because it may cause drowsiness, confusion, lethargy. 4. Continue to hold xarelto for at least 1-2 weeks after discharge until hemoglobin stabilizes. Recheck hemoglobin in 2-3 days after discharge to make sure it is stable.  Discharge Diagnoses:  Active Problems:   Heart disease   Atrial fibrillation   Cerebellar stroke, acute   DM (diabetes mellitus), type 2, uncontrolled   GI bleed   Severe anemia    Discharge Condition: stable   Diet recommendation: as tolerated   History of present illness:  80 -year-old female with past medical history of atrial fibrillation, CAD, stroke on anticoagulation with xarelto, diabetes who presented from skilled nursing facility to [facility] ED 02/21/2014 with progressive weakness over past couple of days prior to this admission. Patient was noted to have hemoglobin of 4 and was referred for admission. In ED, blood pressure was 112/65, heart rate 45 - 107, T max 98.9 F and oxygen saturation 96% on room air.blood work revealed hemoglobin of 5.1, White blood cell count 11.4, platelets 405, sodium 131 and creatinine 1.42. Patient was transfused 2 units of PRBC since admission. Hospital course complicated with acute onset confusion and agitation started 02/23/2014.  Assessment/Plan:    Principal problem: Acute blood loss anemia / GI bleed - Likely triggered by xarelto which was put on hold. Since no further bleeding  we resumed xarelto and monitor for bleed. - hemoglobin dropped to 7.7 this morning. It is reasonable to continue to hold xarelto for additional 1-2 weeks after discharge until hemoglobin stabilizes. - Patient was transfused 2 units of PRBC since admission with appropriate rise in hemoglobin. Since hemoglobin is 7.7 this morning we will transfuse 1 more unit of PRBC prior to discharge. - GI has seen the patient in consultation. Since hemoglobin is stable and patient declined to have endoscopy will hold off on any procedures for now. - continue Protonix 40 mg by mouth daily. Active Problems: Atrial fibrillation - On anticoagulation with xarelto. Continue to hold anticoagulation for additional 1-2 weeks until hemoglobin stabilizes. - use metoprolol for rate control. History of CAD - no complaints of chest pain. Stable. DM (diabetes mellitus), type 2, uncontrolled - no recent A1c. A1c in June 2014 was 7.5.  - A1c this admission 6.6 indicating good glycemic control - continue Lantus 18 untis daily with SSI. - Appreciate diabetic coordinator recommendations. Acute renal failure - with superimposed anemia patient may also have chronic renal failure however we don't have previous recent values for comparison. The last renal function is from 2014 which seemed to be within normal limits. - may hold Lasix. Resume lisinopril. Renal function normalized. If creatinine worsens, consider stopping lisinopril altogether. Leukocytosis - unclear etiology - no evidence of UTI on urinalysis - no fevers.  Acute encephalopathy - unclear etiology, has underlying dementia - may use alternating low-dose Ativan or Haldol.  DVT Prophylaxis  - SCD's bilaterally due to bleeding   Code Status: DNR/DNI  Family Communication: plan of care discussed with the patient's family; agree with d/c  plan     IV access:   Peripheral IV  Procedures and diagnostic studies:    Imaging Results (Last 48 hours)     No results found.   Medical Consultants:   None  Other Consultants:   Physical therapy  Social Work IAnti-Infectives:    None   Signed:  [name], MD  [facility] 02/27/2014, 10:09 AM  Pager #: [phone]   Discharge Exam: Filed Vitals:   02/27/14 0554  BP: 148/73  Pulse: 89  Temp: 98.8 F (37.1 C)  Resp: 18   Filed Vitals:   02/26/14 1407 02/26/14 2029 02/26/14 2335 02/27/14 0554  BP: 165/72 166/82 155/68 148/73  Pulse: 98 103 99 89  Temp: 99.2 F (37.3 C) 98.1 F (36.7 C)  98.8 F (37.1 C)  TempSrc: Axillary Axillary  Oral  Resp: 20 19  18   Height:      Weight:      SpO2: 99% 99%  95%    General: Pt is sleeping, no distress Cardiovascular: rate controlled, S1/S2 appreciated  Respiratory: Clear to auscultation bilaterally, no wheezing, no crackles, no rhonchi Abdominal: Soft, non tender, non distended, bowel sounds +, no guarding Extremities: no cyanosis, pulses palpable bilaterally DP and PT Neuro: Grossly nonfocal  Discharge Instructions  Discharge Instructions    Call MD for:  difficulty breathing, headache or visual disturbances    Complete by:  As directed      Call MD for:  persistant dizziness or light-headedness    Complete by:  As directed      Call MD for:  persistant nausea and vomiting    Complete by:  As directed      Call MD for:  severe uncontrolled pain    Complete by:  As directed      Diet - low sodium heart healthy    Complete by:  As directed      Discharge instructions    Complete by:  As directed        Increase activity slowly    Complete by:  As directed             Medication List    STOP taking these medications        furosemide 20 MG tablet  Commonly known as:  LASIX     sulfamethoxazole-trimethoprim 800-160 MG per tablet  Commonly known as:  BACTRIM DS,SEPTRA DS     XARELTO 20 MG Tabs tablet  Generic drug:  rivaroxaban      TAKE these medications        benzonatate  100 MG capsule  Commonly known as:  TESSALON  Take 100 mg by mouth 3 (three) times daily as needed for cough.     Chlorhexidine Gluconate Cloth 2 % Pads  Apply 6 each topically daily at 6 (six) AM.     colchicine 0.6 MG tablet  Take 0.6 mg by mouth daily with breakfast.     guaiFENesin 600 MG 12 hr tablet  Commonly known as:  MUCINEX  Take 600 mg by mouth every 12 (twelve) hours as needed for cough.     haloperidol 0.5 MG tablet  Commonly known as:  HALDOL  Take 1 tablet (0.5 mg total) by mouth daily with breakfast.     insulin aspart 100 UNIT/ML injection  Commonly known as:  novoLOG  Inject 0-9 Units into the skin 3 (three) times daily with meals.     insulin glargine 100 UNIT/ML injection  Commonly known as:  LANTUS  Inject 18 Units into the skin daily with breakfast.     lisinopril 5 MG tablet  Commonly known as:  PRINIVIL,ZESTRIL  Take 5 mg by mouth daily with breakfast.     LORazepam 0.5 MG tablet  Commonly known as:  ATIVAN  Take 1 tablet (0.5 mg total) by mouth 2 (two) times daily as needed for anxiety (agitation).     Magnesium 250 MG Tabs  Take 250 mg by mouth 2 (two) times daily.     meclizine 12.5 MG tablet  Commonly known as:  ANTIVERT  Take 12.5 mg by mouth every 4 (four) hours as needed for dizziness. If meclizine is not tolerated give phenergan suppository     metoprolol tartrate 12.5 mg Tabs tablet  Commonly known as:  LOPRESSOR  Take 0.5 tablets (12.5 mg total) by mouth 2 (two) times daily.     mirtazapine 15 MG tablet  Commonly known as:  REMERON  Take 1 tablet (15 mg total) by mouth daily with breakfast.     nitroGLYCERIN 0.4 MG SL tablet  Commonly known as:  NITROSTAT  Place 1 tablet (0.4 mg total) under the tongue every 5 (five) minutes x 3 doses as needed for chest pain.     ondansetron 4 MG tablet  Commonly known as:  ZOFRAN  Take 4 mg by mouth every 8 (eight) hours as needed for nausea.     pantoprazole 40 MG tablet  Commonly known  as:  PROTONIX  Take 1 tablet (40 mg total) by mouth daily.     promethazine 12.5 MG suppository  Commonly known as:  PHENERGAN  Place 12.5 mg rectally every 6 (six) hours as needed for nausea or vomiting.     senna-docusate 8.6-50 MG per tablet  Commonly known as:  Senokot-S  Take 1 tablet by mouth daily as needed for mild constipation.     TYLENOL EX ST ARTHRITIS PAIN 500 MG tablet  Generic drug:  acetaminophen  Take 1,000 mg by mouth every 8 (eight) hours as needed (for pain).     vitamin D (CHOLECALCIFEROL) 400 UNITS tablet  Take 400 Units by mouth daily with breakfast.           Follow-up Information    Follow up with Delphina Cahill, MD. Schedule an appointment as soon as possible for a visit in 2 weeks.   Specialty:  Internal Medicine   Why:  Follow up appt after recent hospitalization   Contact information:   [address] [id number]        The results of significant diagnostics from this hospitalization (including imaging, microbiology, ancillary and laboratory) are listed below for reference.    Significant Diagnostic Studies: No results found.  Microbiology: Recent Results (from the past 240 hour(s))  MRSA PCR Screening     Status: Abnormal   Collection Time: 02/21/14  6:52 PM  Result Value Ref Range Status   MRSA by PCR POSITIVE (A) NEGATIVE Final    Comment:        The GeneXpert MRSA Assay (FDA approved for NASAL specimens only), is one component of a comprehensive MRSA colonization surveillance program. It is not intended to diagnose MRSA infection nor to guide or monitor treatment for MRSA infections. RESULT CALLED TO, READ BACK BY AND VERIFIED WITH: [place] 0347 02/21/14 A NAVARRO      Labs: Basic Metabolic Panel:  Recent Labs Lab 02/21/14 1434 02/21/14 1442 02/22/14 0715 02/23/14 0920 02/24/14 0423  NA 131* 133* 136* 137  136*  K 5.3 5.1 4.3 4.0 4.0  CL 96 104 101 102 102  CO2 21  --  24 21 18*  GLUCOSE 250* 253* 200* 174* 201*  BUN  35* 37* 31* 20 16  CREATININE 1.42* 1.50* 1.34* 0.99 0.88  CALCIUM 9.0  --  9.0 9.0 8.9  MG  --   --  2.0  --   --    Liver Function Tests:  Recent Labs Lab 02/21/14 1434  AST 30  ALT 12  ALKPHOS 95  BILITOT 0.4  PROT 6.6  ALBUMIN 3.2*   No results for input(s): LIPASE, AMYLASE in the last 168 hours. No results for input(s): AMMONIA in the last 168 hours. CBC:  Recent Labs Lab 02/21/14 1434  02/22/14 0715 02/23/14 0920 02/24/14 0423 02/25/14 1327 02/27/14 0822  WBC 11.4*  < > 10.7* 11.6* 15.5* 14.0* 14.9*  NEUTROABS 7.4  --   --   --   --   --   --   HGB 3.9*  < > 8.1* 8.0* 8.0* 8.8* 7.7*  HCT 13.7*  < > 25.2* 26.2* 26.2* 28.6* 25.0*  MCV 71.0*  < > 77.1* 79.9 78.7 78.6 78.1  PLT 405*  < > 355 357 311 403* 397  < > = values in this interval not displayed. Cardiac Enzymes: No results for input(s): CKTOTAL, CKMB, CKMBINDEX, TROPONINI in the last 168 hours. BNP: BNP (last 3 results) No results for input(s): PROBNP in the last 8760 hours. CBG:  Recent Labs Lab 02/26/14 1807 02/26/14 2027 02/27/14 0009 02/27/14 0437 02/27/14 0747  GLUCAP 92 90 76 129* 124*    Time coordinating discharge: Over 30 minutes

## 2014-02-27 NOTE — Plan of Care (Signed)
Problem: Phase III Progression Outcomes Goal: Discharge plan remains appropriate-arrangements made Outcome: Completed/Met Date Met:  02/27/14     

## 2014-02-27 NOTE — Progress Notes (Signed)
Report called to [facility] to update them of the pts rash and the pt receiving the ativan.

## 2014-02-27 NOTE — Progress Notes (Signed)
Report called to Joy Cox at [facility]. [facility] called for transport.

## 2014-03-01 LAB — TYPE AND SCREEN
ABO/RH(D): O POS
Antibody Screen: NEGATIVE
UNIT DIVISION: 0

## 2014-03-22 ENCOUNTER — Encounter (HOSPITAL_COMMUNITY): Payer: Self-pay | Admitting: Cardiology

## 2014-04-13 DEATH — deceased
